# Patient Record
Sex: Female | Born: 1950
Health system: Southern US, Community
[De-identification: ages and names within clinical notes are randomized; demographics above are authoritative.]

## PROBLEM LIST (undated history)

## (undated) DIAGNOSIS — Z9289 Personal history of other medical treatment: Secondary | ICD-10-CM

## (undated) DIAGNOSIS — R413 Other amnesia: Secondary | ICD-10-CM

## (undated) DIAGNOSIS — N189 Chronic kidney disease, unspecified: Secondary | ICD-10-CM

## (undated) DIAGNOSIS — G8929 Other chronic pain: Secondary | ICD-10-CM

## (undated) DIAGNOSIS — R569 Unspecified convulsions: Secondary | ICD-10-CM

## (undated) DIAGNOSIS — I509 Heart failure, unspecified: Secondary | ICD-10-CM

## (undated) DIAGNOSIS — E041 Nontoxic single thyroid nodule: Secondary | ICD-10-CM

## (undated) DIAGNOSIS — I499 Cardiac arrhythmia, unspecified: Secondary | ICD-10-CM

## (undated) DIAGNOSIS — K449 Diaphragmatic hernia without obstruction or gangrene: Secondary | ICD-10-CM

## (undated) DIAGNOSIS — M25512 Pain in left shoulder: Secondary | ICD-10-CM

## (undated) DIAGNOSIS — J45909 Unspecified asthma, uncomplicated: Secondary | ICD-10-CM

## (undated) DIAGNOSIS — I4891 Unspecified atrial fibrillation: Secondary | ICD-10-CM

## (undated) DIAGNOSIS — E119 Type 2 diabetes mellitus without complications: Secondary | ICD-10-CM

## (undated) DIAGNOSIS — R12 Heartburn: Secondary | ICD-10-CM

## (undated) DIAGNOSIS — K219 Gastro-esophageal reflux disease without esophagitis: Secondary | ICD-10-CM

## (undated) DIAGNOSIS — C801 Malignant (primary) neoplasm, unspecified: Secondary | ICD-10-CM

## (undated) DIAGNOSIS — F209 Schizophrenia, unspecified: Secondary | ICD-10-CM

## (undated) DIAGNOSIS — F32A Depression, unspecified: Secondary | ICD-10-CM

## (undated) DIAGNOSIS — K7030 Alcoholic cirrhosis of liver without ascites: Secondary | ICD-10-CM

## (undated) DIAGNOSIS — D649 Anemia, unspecified: Secondary | ICD-10-CM

## (undated) DIAGNOSIS — Z8719 Personal history of other diseases of the digestive system: Secondary | ICD-10-CM

## (undated) DIAGNOSIS — F1020 Alcohol dependence, uncomplicated: Secondary | ICD-10-CM

## (undated) DIAGNOSIS — M1990 Unspecified osteoarthritis, unspecified site: Secondary | ICD-10-CM

## (undated) DIAGNOSIS — I10 Essential (primary) hypertension: Secondary | ICD-10-CM

## (undated) HISTORY — PX: NEPHRECTOMY: SHX65

## (undated) HISTORY — DX: Essential (primary) hypertension: I10

## (undated) HISTORY — PX: TONSILLECTOMY: SUR1361

## (undated) HISTORY — PX: JOINT REPLACEMENT: SHX530

---

## 2006-02-14 HISTORY — PX: ANKLE FRACTURE SURGERY: SHX122

## 2014-03-24 DIAGNOSIS — Z1389 Encounter for screening for other disorder: Secondary | ICD-10-CM | POA: Diagnosis not present

## 2014-03-24 DIAGNOSIS — R42 Dizziness and giddiness: Secondary | ICD-10-CM | POA: Diagnosis not present

## 2014-03-24 DIAGNOSIS — G40109 Localization-related (focal) (partial) symptomatic epilepsy and epileptic syndromes with simple partial seizures, not intractable, without status epilepticus: Secondary | ICD-10-CM | POA: Diagnosis not present

## 2014-03-24 DIAGNOSIS — F339 Major depressive disorder, recurrent, unspecified: Secondary | ICD-10-CM | POA: Diagnosis not present

## 2014-03-24 DIAGNOSIS — R5383 Other fatigue: Secondary | ICD-10-CM | POA: Diagnosis not present

## 2014-03-28 DIAGNOSIS — R748 Abnormal levels of other serum enzymes: Secondary | ICD-10-CM | POA: Diagnosis not present

## 2014-03-28 DIAGNOSIS — R42 Dizziness and giddiness: Secondary | ICD-10-CM | POA: Diagnosis not present

## 2014-03-28 DIAGNOSIS — G40109 Localization-related (focal) (partial) symptomatic epilepsy and epileptic syndromes with simple partial seizures, not intractable, without status epilepticus: Secondary | ICD-10-CM | POA: Diagnosis not present

## 2014-03-28 DIAGNOSIS — D649 Anemia, unspecified: Secondary | ICD-10-CM | POA: Diagnosis not present

## 2014-03-28 DIAGNOSIS — F339 Major depressive disorder, recurrent, unspecified: Secondary | ICD-10-CM | POA: Diagnosis not present

## 2014-03-28 DIAGNOSIS — R5383 Other fatigue: Secondary | ICD-10-CM | POA: Diagnosis not present

## 2014-04-02 DIAGNOSIS — R5383 Other fatigue: Secondary | ICD-10-CM | POA: Diagnosis not present

## 2014-04-02 DIAGNOSIS — I10 Essential (primary) hypertension: Secondary | ICD-10-CM | POA: Diagnosis not present

## 2014-04-02 DIAGNOSIS — R748 Abnormal levels of other serum enzymes: Secondary | ICD-10-CM | POA: Diagnosis not present

## 2014-04-02 DIAGNOSIS — R42 Dizziness and giddiness: Secondary | ICD-10-CM | POA: Diagnosis not present

## 2014-04-02 DIAGNOSIS — H8113 Benign paroxysmal vertigo, bilateral: Secondary | ICD-10-CM | POA: Diagnosis not present

## 2014-04-02 DIAGNOSIS — Z0000 Encounter for general adult medical examination without abnormal findings: Secondary | ICD-10-CM | POA: Diagnosis not present

## 2014-04-02 DIAGNOSIS — D649 Anemia, unspecified: Secondary | ICD-10-CM | POA: Diagnosis not present

## 2014-04-02 DIAGNOSIS — G40109 Localization-related (focal) (partial) symptomatic epilepsy and epileptic syndromes with simple partial seizures, not intractable, without status epilepticus: Secondary | ICD-10-CM | POA: Diagnosis not present

## 2014-04-02 DIAGNOSIS — F339 Major depressive disorder, recurrent, unspecified: Secondary | ICD-10-CM | POA: Diagnosis not present

## 2014-04-16 DIAGNOSIS — R42 Dizziness and giddiness: Secondary | ICD-10-CM | POA: Diagnosis not present

## 2014-04-16 DIAGNOSIS — R5383 Other fatigue: Secondary | ICD-10-CM | POA: Diagnosis not present

## 2014-04-16 DIAGNOSIS — G40109 Localization-related (focal) (partial) symptomatic epilepsy and epileptic syndromes with simple partial seizures, not intractable, without status epilepticus: Secondary | ICD-10-CM | POA: Diagnosis not present

## 2014-04-16 DIAGNOSIS — Z23 Encounter for immunization: Secondary | ICD-10-CM | POA: Diagnosis not present

## 2014-04-16 DIAGNOSIS — I10 Essential (primary) hypertension: Secondary | ICD-10-CM | POA: Diagnosis not present

## 2014-05-07 ENCOUNTER — Ambulatory Visit: Payer: Medicare Other | Admitting: Neurology

## 2014-05-15 ENCOUNTER — Ambulatory Visit: Payer: Medicare Other | Admitting: Neurology

## 2014-05-15 ENCOUNTER — Telehealth: Payer: Self-pay

## 2014-05-15 NOTE — Telephone Encounter (Signed)
Patient did not come to a new patient appointment today. 

## 2014-05-19 DIAGNOSIS — R195 Other fecal abnormalities: Secondary | ICD-10-CM | POA: Diagnosis not present

## 2014-05-26 ENCOUNTER — Encounter: Payer: Self-pay | Admitting: Neurology

## 2014-05-28 DIAGNOSIS — D649 Anemia, unspecified: Secondary | ICD-10-CM | POA: Diagnosis not present

## 2014-05-28 DIAGNOSIS — R195 Other fecal abnormalities: Secondary | ICD-10-CM | POA: Diagnosis not present

## 2014-05-28 DIAGNOSIS — Z87891 Personal history of nicotine dependence: Secondary | ICD-10-CM | POA: Diagnosis not present

## 2014-05-28 DIAGNOSIS — D120 Benign neoplasm of cecum: Secondary | ICD-10-CM | POA: Diagnosis not present

## 2014-05-28 DIAGNOSIS — Z8249 Family history of ischemic heart disease and other diseases of the circulatory system: Secondary | ICD-10-CM | POA: Diagnosis not present

## 2014-05-28 DIAGNOSIS — E669 Obesity, unspecified: Secondary | ICD-10-CM | POA: Diagnosis not present

## 2014-05-28 DIAGNOSIS — K228 Other specified diseases of esophagus: Secondary | ICD-10-CM | POA: Diagnosis not present

## 2014-05-28 DIAGNOSIS — K259 Gastric ulcer, unspecified as acute or chronic, without hemorrhage or perforation: Secondary | ICD-10-CM | POA: Diagnosis not present

## 2014-05-28 DIAGNOSIS — Z79899 Other long term (current) drug therapy: Secondary | ICD-10-CM | POA: Diagnosis not present

## 2014-05-28 DIAGNOSIS — Z8489 Family history of other specified conditions: Secondary | ICD-10-CM | POA: Diagnosis not present

## 2014-05-28 DIAGNOSIS — I10 Essential (primary) hypertension: Secondary | ICD-10-CM | POA: Diagnosis not present

## 2014-05-28 DIAGNOSIS — Z91041 Radiographic dye allergy status: Secondary | ICD-10-CM | POA: Diagnosis not present

## 2014-05-28 DIAGNOSIS — K449 Diaphragmatic hernia without obstruction or gangrene: Secondary | ICD-10-CM | POA: Diagnosis not present

## 2014-05-28 DIAGNOSIS — K5730 Diverticulosis of large intestine without perforation or abscess without bleeding: Secondary | ICD-10-CM | POA: Diagnosis not present

## 2014-05-28 DIAGNOSIS — Z6836 Body mass index (BMI) 36.0-36.9, adult: Secondary | ICD-10-CM | POA: Diagnosis not present

## 2014-05-28 DIAGNOSIS — F329 Major depressive disorder, single episode, unspecified: Secondary | ICD-10-CM | POA: Diagnosis not present

## 2014-06-02 DIAGNOSIS — D120 Benign neoplasm of cecum: Secondary | ICD-10-CM | POA: Diagnosis not present

## 2014-09-17 DIAGNOSIS — K219 Gastro-esophageal reflux disease without esophagitis: Secondary | ICD-10-CM | POA: Diagnosis not present

## 2014-09-17 DIAGNOSIS — M79671 Pain in right foot: Secondary | ICD-10-CM | POA: Diagnosis not present

## 2014-09-17 DIAGNOSIS — M129 Arthropathy, unspecified: Secondary | ICD-10-CM | POA: Diagnosis not present

## 2014-09-17 DIAGNOSIS — I10 Essential (primary) hypertension: Secondary | ICD-10-CM | POA: Diagnosis not present

## 2015-12-04 DIAGNOSIS — T148XXA Other injury of unspecified body region, initial encounter: Secondary | ICD-10-CM | POA: Diagnosis not present

## 2015-12-04 DIAGNOSIS — S2239XA Fracture of one rib, unspecified side, initial encounter for closed fracture: Secondary | ICD-10-CM | POA: Diagnosis not present

## 2015-12-04 DIAGNOSIS — W19XXXA Unspecified fall, initial encounter: Secondary | ICD-10-CM | POA: Diagnosis not present

## 2015-12-04 DIAGNOSIS — R109 Unspecified abdominal pain: Secondary | ICD-10-CM | POA: Diagnosis not present

## 2015-12-04 DIAGNOSIS — S2242XA Multiple fractures of ribs, left side, initial encounter for closed fracture: Secondary | ICD-10-CM | POA: Diagnosis not present

## 2015-12-04 DIAGNOSIS — K469 Unspecified abdominal hernia without obstruction or gangrene: Secondary | ICD-10-CM | POA: Diagnosis not present

## 2015-12-04 DIAGNOSIS — R0781 Pleurodynia: Secondary | ICD-10-CM | POA: Diagnosis not present

## 2015-12-04 DIAGNOSIS — W0110XA Fall on same level from slipping, tripping and stumbling with subsequent striking against unspecified object, initial encounter: Secondary | ICD-10-CM | POA: Diagnosis not present

## 2015-12-04 DIAGNOSIS — D649 Anemia, unspecified: Secondary | ICD-10-CM | POA: Diagnosis not present

## 2015-12-05 DIAGNOSIS — D649 Anemia, unspecified: Secondary | ICD-10-CM | POA: Diagnosis not present

## 2015-12-05 DIAGNOSIS — S2239XA Fracture of one rib, unspecified side, initial encounter for closed fracture: Secondary | ICD-10-CM | POA: Diagnosis not present

## 2015-12-05 DIAGNOSIS — W19XXXA Unspecified fall, initial encounter: Secondary | ICD-10-CM | POA: Diagnosis not present

## 2015-12-06 DIAGNOSIS — D649 Anemia, unspecified: Secondary | ICD-10-CM | POA: Diagnosis not present

## 2015-12-06 DIAGNOSIS — W19XXXA Unspecified fall, initial encounter: Secondary | ICD-10-CM | POA: Diagnosis not present

## 2015-12-06 DIAGNOSIS — S2239XA Fracture of one rib, unspecified side, initial encounter for closed fracture: Secondary | ICD-10-CM | POA: Diagnosis not present

## 2015-12-07 DIAGNOSIS — D649 Anemia, unspecified: Secondary | ICD-10-CM | POA: Diagnosis not present

## 2015-12-07 DIAGNOSIS — R195 Other fecal abnormalities: Secondary | ICD-10-CM | POA: Diagnosis not present

## 2015-12-08 DIAGNOSIS — N390 Urinary tract infection, site not specified: Secondary | ICD-10-CM | POA: Diagnosis not present

## 2015-12-08 DIAGNOSIS — R195 Other fecal abnormalities: Secondary | ICD-10-CM | POA: Diagnosis not present

## 2015-12-08 DIAGNOSIS — D649 Anemia, unspecified: Secondary | ICD-10-CM | POA: Diagnosis not present

## 2015-12-16 DIAGNOSIS — Z23 Encounter for immunization: Secondary | ICD-10-CM | POA: Diagnosis not present

## 2015-12-17 DIAGNOSIS — M79604 Pain in right leg: Secondary | ICD-10-CM | POA: Diagnosis not present

## 2015-12-17 DIAGNOSIS — M79661 Pain in right lower leg: Secondary | ICD-10-CM | POA: Diagnosis not present

## 2016-01-12 DIAGNOSIS — K219 Gastro-esophageal reflux disease without esophagitis: Secondary | ICD-10-CM | POA: Diagnosis not present

## 2016-01-12 DIAGNOSIS — I10 Essential (primary) hypertension: Secondary | ICD-10-CM | POA: Diagnosis not present

## 2016-01-12 DIAGNOSIS — D649 Anemia, unspecified: Secondary | ICD-10-CM | POA: Diagnosis not present

## 2016-12-15 DIAGNOSIS — E875 Hyperkalemia: Secondary | ICD-10-CM | POA: Diagnosis not present

## 2016-12-15 DIAGNOSIS — R110 Nausea: Secondary | ICD-10-CM | POA: Diagnosis not present

## 2016-12-15 DIAGNOSIS — K920 Hematemesis: Secondary | ICD-10-CM | POA: Diagnosis not present

## 2016-12-15 DIAGNOSIS — R1110 Vomiting, unspecified: Secondary | ICD-10-CM | POA: Diagnosis not present

## 2016-12-15 DIAGNOSIS — I951 Orthostatic hypotension: Secondary | ICD-10-CM | POA: Diagnosis not present

## 2016-12-15 DIAGNOSIS — K2970 Gastritis, unspecified, without bleeding: Secondary | ICD-10-CM | POA: Diagnosis not present

## 2016-12-16 ENCOUNTER — Encounter (HOSPITAL_COMMUNITY): Payer: Self-pay | Admitting: Emergency Medicine

## 2016-12-16 ENCOUNTER — Inpatient Hospital Stay (HOSPITAL_COMMUNITY)
Admission: EM | Admit: 2016-12-16 | Discharge: 2016-12-27 | DRG: 391 | Disposition: A | Discharge status: Other Acute Inpatient Hospital | Payer: Medicare Other | Attending: Pulmonary Disease | Admitting: Pulmonary Disease

## 2016-12-16 DIAGNOSIS — F1020 Alcohol dependence, uncomplicated: Secondary | ICD-10-CM | POA: Diagnosis present

## 2016-12-16 DIAGNOSIS — E079 Disorder of thyroid, unspecified: Secondary | ICD-10-CM | POA: Diagnosis present

## 2016-12-16 DIAGNOSIS — K2950 Unspecified chronic gastritis without bleeding: Secondary | ICD-10-CM | POA: Diagnosis present

## 2016-12-16 DIAGNOSIS — R079 Chest pain, unspecified: Secondary | ICD-10-CM | POA: Diagnosis not present

## 2016-12-16 DIAGNOSIS — J9601 Acute respiratory failure with hypoxia: Secondary | ICD-10-CM | POA: Diagnosis not present

## 2016-12-16 DIAGNOSIS — Z791 Long term (current) use of non-steroidal anti-inflammatories (NSAID): Secondary | ICD-10-CM

## 2016-12-16 DIAGNOSIS — M25511 Pain in right shoulder: Secondary | ICD-10-CM | POA: Diagnosis present

## 2016-12-16 DIAGNOSIS — K7030 Alcoholic cirrhosis of liver without ascites: Secondary | ICD-10-CM | POA: Diagnosis present

## 2016-12-16 DIAGNOSIS — K219 Gastro-esophageal reflux disease without esophagitis: Secondary | ICD-10-CM | POA: Diagnosis present

## 2016-12-16 DIAGNOSIS — N281 Cyst of kidney, acquired: Secondary | ICD-10-CM | POA: Diagnosis present

## 2016-12-16 DIAGNOSIS — R0602 Shortness of breath: Secondary | ICD-10-CM

## 2016-12-16 DIAGNOSIS — K3189 Other diseases of stomach and duodenum: Secondary | ICD-10-CM

## 2016-12-16 DIAGNOSIS — R7303 Prediabetes: Secondary | ICD-10-CM | POA: Diagnosis present

## 2016-12-16 DIAGNOSIS — I4891 Unspecified atrial fibrillation: Secondary | ICD-10-CM | POA: Diagnosis not present

## 2016-12-16 DIAGNOSIS — D6489 Other specified anemias: Secondary | ICD-10-CM | POA: Diagnosis present

## 2016-12-16 DIAGNOSIS — G8929 Other chronic pain: Secondary | ICD-10-CM | POA: Diagnosis present

## 2016-12-16 DIAGNOSIS — K319 Disease of stomach and duodenum, unspecified: Secondary | ICD-10-CM | POA: Diagnosis not present

## 2016-12-16 DIAGNOSIS — R1012 Left upper quadrant pain: Secondary | ICD-10-CM

## 2016-12-16 DIAGNOSIS — Z23 Encounter for immunization: Secondary | ICD-10-CM

## 2016-12-16 DIAGNOSIS — N2889 Other specified disorders of kidney and ureter: Secondary | ICD-10-CM | POA: Diagnosis present

## 2016-12-16 DIAGNOSIS — R52 Pain, unspecified: Secondary | ICD-10-CM

## 2016-12-16 DIAGNOSIS — K449 Diaphragmatic hernia without obstruction or gangrene: Secondary | ICD-10-CM | POA: Diagnosis present

## 2016-12-16 DIAGNOSIS — I700 Atherosclerosis of aorta: Secondary | ICD-10-CM | POA: Diagnosis present

## 2016-12-16 DIAGNOSIS — F419 Anxiety disorder, unspecified: Secondary | ICD-10-CM | POA: Diagnosis present

## 2016-12-16 DIAGNOSIS — R1110 Vomiting, unspecified: Secondary | ICD-10-CM | POA: Diagnosis not present

## 2016-12-16 DIAGNOSIS — E43 Unspecified severe protein-calorie malnutrition: Secondary | ICD-10-CM | POA: Diagnosis not present

## 2016-12-16 DIAGNOSIS — E611 Iron deficiency: Secondary | ICD-10-CM | POA: Diagnosis present

## 2016-12-16 DIAGNOSIS — E876 Hypokalemia: Secondary | ICD-10-CM | POA: Diagnosis present

## 2016-12-16 DIAGNOSIS — M1990 Unspecified osteoarthritis, unspecified site: Secondary | ICD-10-CM | POA: Diagnosis present

## 2016-12-16 DIAGNOSIS — M25512 Pain in left shoulder: Secondary | ICD-10-CM | POA: Diagnosis present

## 2016-12-16 DIAGNOSIS — I493 Ventricular premature depolarization: Secondary | ICD-10-CM | POA: Diagnosis present

## 2016-12-16 DIAGNOSIS — R112 Nausea with vomiting, unspecified: Secondary | ICD-10-CM

## 2016-12-16 DIAGNOSIS — G9340 Encephalopathy, unspecified: Secondary | ICD-10-CM | POA: Diagnosis present

## 2016-12-16 DIAGNOSIS — D649 Anemia, unspecified: Secondary | ICD-10-CM | POA: Diagnosis present

## 2016-12-16 DIAGNOSIS — G9341 Metabolic encephalopathy: Secondary | ICD-10-CM | POA: Diagnosis not present

## 2016-12-16 DIAGNOSIS — K311 Adult hypertrophic pyloric stenosis: Secondary | ICD-10-CM | POA: Diagnosis present

## 2016-12-16 DIAGNOSIS — E871 Hypo-osmolality and hyponatremia: Secondary | ICD-10-CM | POA: Diagnosis present

## 2016-12-16 DIAGNOSIS — K5730 Diverticulosis of large intestine without perforation or abscess without bleeding: Secondary | ICD-10-CM | POA: Diagnosis present

## 2016-12-16 DIAGNOSIS — K254 Chronic or unspecified gastric ulcer with hemorrhage: Secondary | ICD-10-CM | POA: Diagnosis present

## 2016-12-16 DIAGNOSIS — Z91041 Radiographic dye allergy status: Secondary | ICD-10-CM

## 2016-12-16 DIAGNOSIS — R10816 Epigastric abdominal tenderness: Secondary | ICD-10-CM | POA: Diagnosis present

## 2016-12-16 DIAGNOSIS — J9690 Respiratory failure, unspecified, unspecified whether with hypoxia or hypercapnia: Secondary | ICD-10-CM

## 2016-12-16 DIAGNOSIS — Z9889 Other specified postprocedural states: Secondary | ICD-10-CM

## 2016-12-16 DIAGNOSIS — R1111 Vomiting without nausea: Secondary | ICD-10-CM | POA: Diagnosis not present

## 2016-12-16 DIAGNOSIS — D62 Acute posthemorrhagic anemia: Secondary | ICD-10-CM | POA: Diagnosis present

## 2016-12-16 DIAGNOSIS — I10 Essential (primary) hypertension: Secondary | ICD-10-CM | POA: Diagnosis present

## 2016-12-16 DIAGNOSIS — Z4659 Encounter for fitting and adjustment of other gastrointestinal appliance and device: Secondary | ICD-10-CM

## 2016-12-16 DIAGNOSIS — I480 Paroxysmal atrial fibrillation: Secondary | ICD-10-CM | POA: Diagnosis present

## 2016-12-16 DIAGNOSIS — Z91013 Allergy to seafood: Secondary | ICD-10-CM

## 2016-12-16 DIAGNOSIS — J690 Pneumonitis due to inhalation of food and vomit: Secondary | ICD-10-CM | POA: Diagnosis not present

## 2016-12-16 HISTORY — DX: Alcohol dependence, uncomplicated: F10.20

## 2016-12-16 HISTORY — DX: Other chronic pain: G89.29

## 2016-12-16 HISTORY — DX: Pain in left shoulder: M25.512

## 2016-12-16 HISTORY — DX: Heartburn: R12

## 2016-12-16 HISTORY — DX: Diaphragmatic hernia without obstruction or gangrene: K44.9

## 2016-12-16 HISTORY — DX: Unspecified osteoarthritis, unspecified site: M19.90

## 2016-12-16 LAB — COMPREHENSIVE METABOLIC PANEL
ALBUMIN: 2.4 g/dL — AB (ref 3.5–5.0)
ALK PHOS: 94 U/L (ref 38–126)
ALT: 13 U/L — AB (ref 14–54)
ANION GAP: 12 (ref 5–15)
AST: 16 U/L (ref 15–41)
BILIRUBIN TOTAL: 1 mg/dL (ref 0.3–1.2)
BUN: 18 mg/dL (ref 6–20)
CHLORIDE: 95 mmol/L — AB (ref 101–111)
CO2: 24 mmol/L (ref 22–32)
CREATININE: 0.74 mg/dL (ref 0.44–1.00)
Calcium: 8.2 mg/dL — ABNORMAL LOW (ref 8.9–10.3)
Glucose, Bld: 127 mg/dL — ABNORMAL HIGH (ref 65–99)
POTASSIUM: 3.2 mmol/L — AB (ref 3.5–5.1)
SODIUM: 131 mmol/L — AB (ref 135–145)
Total Protein: 6 g/dL — ABNORMAL LOW (ref 6.5–8.1)

## 2016-12-16 LAB — LIPASE, BLOOD: Lipase: 129 U/L — ABNORMAL HIGH (ref 11–51)

## 2016-12-16 LAB — CBC
HCT: 31.8 % — ABNORMAL LOW (ref 36.0–46.0)
Hemoglobin: 10.2 g/dL — ABNORMAL LOW (ref 12.0–15.0)
MCH: 27.3 pg (ref 26.0–34.0)
MCHC: 32.1 g/dL (ref 30.0–36.0)
MCV: 85 fL (ref 78.0–100.0)
PLATELETS: 427 10*3/uL — AB (ref 150–400)
RBC: 3.74 MIL/uL — AB (ref 3.87–5.11)
RDW: 15.5 % (ref 11.5–15.5)
WBC: 12.7 10*3/uL — AB (ref 4.0–10.5)

## 2016-12-16 MED ORDER — ONDANSETRON HCL 4 MG/2ML IJ SOLN
4.0000 mg | Freq: Once | INTRAMUSCULAR | Status: AC
  Administered 2016-12-17: 4 mg via INTRAVENOUS
  Filled 2016-12-16: qty 2

## 2016-12-16 MED ORDER — ONDANSETRON 4 MG PO TBDP
4.0000 mg | ORAL_TABLET | Freq: Once | ORAL | Status: AC | PRN
  Administered 2016-12-16: 4 mg via ORAL
  Filled 2016-12-16: qty 1

## 2016-12-16 MED ORDER — SODIUM CHLORIDE 0.9 % IV BOLUS (SEPSIS)
1000.0000 mL | Freq: Once | INTRAVENOUS | Status: AC
  Administered 2016-12-17: 1000 mL via INTRAVENOUS

## 2016-12-16 MED ORDER — HYDROMORPHONE HCL 1 MG/ML IJ SOLN
0.5000 mg | Freq: Once | INTRAMUSCULAR | Status: AC
  Administered 2016-12-17: 0.5 mg via INTRAVENOUS
  Filled 2016-12-16: qty 1

## 2016-12-16 NOTE — ED Provider Notes (Addendum)
[facility] EMERGENCY DEPARTMENT Provider Note   CSN: [id number] Arrival date & time: 12/16/16  1646     History   Chief Complaint Chief Complaint  Patient presents with  . Emesis  . Abdominal Pain    HPI Nicole [name] is a 66 y.o. female.  Patient presents to the emergency department for evaluation of upper abdominal pain with nausea and vomiting.  Symptoms ongoing for 5 days now.  Patient does report that one week ago she did drink heavily.  She has not drank anything since then, however.  She has never had pancreatitis.  She has had issues with gastritis and acid reflux.  Patient reports of persistent pain in the upper abdomen that worsens when she eats then he vomits.  No diarrhea, melena, hematochezia.  She has not had hematemesis.  She is trying to hold down sips of water but does not feel like she has been able to hydrate.      Past Medical History:  Diagnosis Date  . Arthritis   . Chronic alcoholism ([place])   . Chronic right shoulder pain   . Heartburn   . Hiatal hernia 12/17/2016   stomach noted in chest on CT scan  . Hypertension     Patient Active Problem List   Diagnosis Date Noted  . Gastric mass 12/17/2016    Class: Acute  . Right renal mass 12/17/2016    Class: Acute  . Diverticulosis of sigmoid colon 12/17/2016  . Hiatal hernia 12/17/2016    Class: Acute  . Chronic alcoholism ([place]) 12/17/2016    Class: Chronic  . Chronic right shoulder pain 12/17/2016    Class: Chronic  . Arthritis 12/17/2016    Class: Chronic  . Hyponatremia 12/17/2016    Class: Acute  . Hypokalemia 12/17/2016    Class: Acute  . Anemia 12/17/2016    Past Surgical History:  Procedure Laterality Date  . ANKLE FRACTURE SURGERY Right 2008   screws  . ANKLE FRACTURE SURGERY Left 2008   plates    OB History    No data available       Home Medications    Prior to Admission medications   Medication Sig Start Date End Date Taking? Authorizing Provider   acetaminophen (TYLENOL) 500 MG tablet Take 1,000 mg by mouth at bedtime.   Yes [provider]  Calcium Carb-Cholecalciferol (CALCIUM 600 + D PO) Take 600 mg by mouth daily with breakfast.   Yes [provider]  ferrous sulfate 325 (65 FE) MG tablet Take 325 mg by mouth daily with breakfast.   Yes [provider]  guaiFENesin (MUCINEX) 600 MG 12 hr tablet Take 600 mg by mouth daily.   Yes [provider]  ibuprofen (ADVIL,MOTRIN) 200 MG tablet Take 400 mg by mouth daily as needed (arthritis pain).   Yes [provider]  lisinopril-hydrochlorothiazide (PRINZIDE,ZESTORETIC) 20-25 MG tablet Take 1 tablet by mouth daily.   Yes [provider]  Multiple Vitamin (MULTIVITAMIN WITH MINERALS) TABS tablet Take 1 tablet by mouth daily with breakfast.   Yes [provider]  ondansetron (ZOFRAN-ODT) 8 MG disintegrating tablet Take 8 mg by mouth every 8 (eight) hours as needed for nausea or vomiting.   Yes [provider]  pantoprazole (PROTONIX) 40 MG tablet Take 40 mg by mouth daily.   Yes [provider]  phenylephrine (SUDAFED PE) 10 MG TABS tablet Take 10 mg by mouth at bedtime.   Yes [provider]  Propylene Glycol (  SYSTANE COMPLETE) 0.6 % SOLN Place 1 drop into both eyes See admin instructions. Instill one drop into both eyes every morning, may also use later in the day as needed for dry eyes   Yes [provider]  sucralfate (CARAFATE) 1 g tablet Take 1 g by mouth See admin instructions. Take 1 tablet (1 g) by mouth 4 times daily - thirty minutes before meals and bedtime   Yes [provider]    Family History Family History  Problem Relation Age of Onset  . Heart attack Father   . Ulcers Father   . Diabetes Sister   . Cancer Brother   . Diabetes Sister   . Diabetes Sister     Social History Social History  Substance Use Topics  . Smoking status: Never Smoker  . Smokeless tobacco:  Never Used  . Alcohol use Yes     Comment: heavy     Allergies   Other; Shellfish allergy; and Iodinated diagnostic agents   Review of Systems Review of Systems  Gastrointestinal: Positive for abdominal pain, nausea and vomiting.  All other systems reviewed and are negative.    Physical Exam Updated Vital Signs BP 112/69   Pulse 99   Temp 98.2 F (36.8 C) (Oral)   Resp 18   SpO2 93%   Physical Exam  Constitutional: She is oriented to person, place, and time. She appears well-developed and well-nourished. No distress.  HENT:  Head: Normocephalic and atraumatic.  Right Ear: Hearing normal.  Left Ear: Hearing normal.  Nose: Nose normal.  Mouth/Throat: Oropharynx is clear and moist and mucous membranes are normal.  Eyes: Pupils are equal, round, and reactive to light. Conjunctivae and EOM are normal.  Neck: Normal range of motion. Neck supple.  Cardiovascular: Regular rhythm, S1 normal and S2 normal.  Exam reveals no gallop and no friction rub.   No murmur heard. Pulmonary/Chest: Effort normal and breath sounds normal. No respiratory distress. She exhibits no tenderness.  Abdominal: Soft. Normal appearance and bowel sounds are normal. There is no hepatosplenomegaly. There is tenderness in the epigastric area and left upper quadrant. There is no rebound, no guarding, no tenderness at McBurney's point and negative Murphy's sign. No hernia.  Musculoskeletal: Normal range of motion.  Neurological: She is alert and oriented to person, place, and time. She has normal strength. No cranial nerve deficit or sensory deficit. Coordination normal. GCS eye subscore is 4. GCS verbal subscore is 5. GCS motor subscore is 6.  Skin: Skin is warm, dry and intact. No rash noted. No cyanosis.  Psychiatric: She has a normal mood and affect. Her speech is normal and behavior is normal. Thought content normal.  Nursing note and vitals reviewed.    ED Treatments / Results  Labs (all labs ordered  are listed, but only abnormal results are displayed) Labs Reviewed  LIPASE, BLOOD - Abnormal; Notable for the following:       Result Value   Lipase 129 (*)    All other components within normal limits  COMPREHENSIVE METABOLIC PANEL - Abnormal; Notable for the following:    Sodium 131 (*)    Potassium 3.2 (*)    Chloride 95 (*)    Glucose, Bld 127 (*)    Calcium 8.2 (*)    Total Protein 6.0 (*)    Albumin 2.4 (*)    ALT 13 (*)    All other components within normal limits  CBC - Abnormal; Notable for the following:  WBC 12.7 (*)    RBC 3.74 (*)    Hemoglobin 10.2 (*)    HCT 31.8 (*)    Platelets 427 (*)    All other components within normal limits  URINALYSIS, ROUTINE W REFLEX MICROSCOPIC - Abnormal; Notable for the following:    Hgb urine dipstick SMALL (*)    Ketones, ur 80 (*)    Protein, ur 30 (*)    Bacteria, UA RARE (*)    Squamous Epithelial / LPF 0-5 (*)    All other components within normal limits  IRON AND TIBC  FERRITIN    EKG  EKG Interpretation  Date/Time:  Saturday December 17 2016 08:40:17 EDT Ventricular Rate:  163 PR Interval:    QRS Duration: 87 QT Interval:  294 QTC Calculation: 485 R Axis:   115 Text Interpretation:  Atrial fibrillation with rapid V-rate Ventricular premature complex Left posterior fascicular block Abnormal R-wave progression, late transition Minimal ST depression, inferior leads Borderline T abnormalities, inferior leads Confirmed by Orpah Greek [phone]) on 12/17/2016 8:46:51 AM       Radiology Ct Abdomen Pelvis Wo Contrast  Result Date: 12/17/2016 CLINICAL DATA:  Abdominal pain.  Nausea and vomiting for 4 days. EXAM: CT ABDOMEN AND PELVIS WITHOUT CONTRAST TECHNIQUE: Multidetector CT imaging of the abdomen and pelvis was performed following the standard protocol without IV contrast. COMPARISON:  None. FINDINGS: Lower chest: Small left and trace right pleural effusion. Large hiatal hernia is only partially included in  the field of view. Hepatobiliary: Low-density structure either within or abutting the left lobe of the liver is incontinuity with an ill-defined left upper quadrant fluid collection. No suspicious lesion on noncontrast exam. Gallbladder physiologically distended, no calcified stone. No biliary dilatation. Pancreas: Pancreas is poorly defined in the absence of IV contrast. Fluid within or extraluminal to the stomach abuts the region of the proximal pancreas. Spleen: Normal in size without focal abnormality. Adrenals/Urinary Tract: Left adrenal thickening. Normal right adrenal gland. There is a 6.2 cm low-density mass in the right kidney with central calcifications no hydronephrosis. No left hydronephrosis or focal renal lesion on noncontrast exam. Urinary bladder is in a small left upper quadrant irregular fluid collection. Stomach/bowel: Large hiatal hernia, majority of the stomach is intrathoracic. Administered enteric contrast outlines a large 12 cm homogeneous low-density gastric mass. This extends to the diaphragmatic hiatus. Adjacent to the hiatus in the upper abdomen is fluid that may be within a gastric diverticulum or fluid collection image 26 series 3. There is a separate 6.2 cm fluid collection abutting the distal stomach image 33 series 3. Associated soft tissue stranding in the right abdomen and left upper quadrant. Ill-defined left upper quadrant fluid anteriorly extends to the left lobe of the liver. No air within any of these fluid collections. There is no extraluminal enteric contrast. Despite the extraluminal fluid collections there is no extraluminal air. Enteric contrast opacifies normal-appearing duodenum and small bowel without obstruction or wall thickening. Normal appendix tentatively identified. No colonic wall thickening or inflammation, diverticulosis noted from the splenic flexure distally. No diverticulitis. Vascular/Lymphatic: Dense aortic atherosclerosis. No aneurysm. Vascular  structures particularly in the upper abdomen are not well-defined. Limited assessment for upper abdominal adenopathy. Reproductive: Uterus and bilateral adnexa are unremarkable. Other: Perigastric fluid collections in the upper abdomen as described. Upper abdominal soft tissue stranding. Small amount of free fluid in the pelvis. No evidence of free air. Musculoskeletal: There are no acute or suspicious osseous abnormalities. Degenerative change in the spine and  both hips. IMPRESSION: 1. Inflammatory process in the upper abdomen, appears centered on the stomach. There is a large hiatal hernia, which contains a large gastric filling defect, which may be a gastric mass, bezoar or less likely hematoma. This is in possible continuity with gastric diverticulum or perigastric fluid collection in the upper abdomen. There are separate perigastric fluid collections in the upper abdomen, with adjacent soft tissue stranding and inflammation. Despite the peri-gastric fluid collections there is no free air or extraluminal contrast. Underlying etiology is uncertain, however considerations include penetrating gastric mass with associated abscesses. Peptic ulcer disease with intragastric hematoma and contained perforations are also considered. Alternative sources of left upper quadrant fluid collection such as pancreatitis with pseudocyst are felt less likely. Recommend GI consultation, possible endoscopic evaluation. 2. Heterogeneous 6.2 cm right renal mass, complex cyst versus solid lesion. Recommend nonemergent characterization with renal protocol MRI after resolution of acute event. These results were called by telephone at the time of interpretation on 12/17/2016 at 3:34 am to Dr. Joseph Berkshire , who verbally acknowledged these results. Electronically Signed   By: Jeb Levering M.D.   On: 12/17/2016 03:42    Procedures Procedures (including critical care time)  Medications Ordered in ED Medications  iopamidol  (ISOVUE-300) 61 % injection (not administered)  Barium Sulfate 2.1 % SUSP (not administered)  pantoprazole (PROTONIX) 80 mg in sodium chloride 0.9 % 250 mL (0.32 mg/mL) infusion (0 mg/hr Intravenous Stopped 12/17/16 0823)  pantoprazole (PROTONIX) injection 40 mg (not administered)  0.9 % NaCl with KCl 40 mEq / L  infusion (not administered)  lisinopril (PRINIVIL,ZESTRIL) tablet 20 mg (not administered)    And  hydrochlorothiazide (HYDRODIURIL) tablet 25 mg (not administered)  diltiazem (CARDIZEM) 1 mg/mL load via infusion 10 mg (not administered)    And  diltiazem (CARDIZEM) 100 mg in dextrose 5 % 100 mL (1 mg/mL) infusion (not administered)  ondansetron (ZOFRAN-ODT) disintegrating tablet 4 mg (4 mg Oral Given 12/16/16 2330)  sodium chloride 0.9 % bolus 1,000 mL (0 mLs Intravenous Stopped 12/17/16 0110)  HYDROmorphone (DILAUDID) injection 0.5 mg (0.5 mg Intravenous Given 12/17/16 0009)  ondansetron (ZOFRAN) injection 4 mg (4 mg Intravenous Given 12/17/16 0010)  HYDROmorphone (DILAUDID) injection 1 mg (1 mg Intravenous Given 12/17/16 0414)  piperacillin-tazobactam (ZOSYN) IVPB 3.375 g (0 g Intravenous Stopped 12/17/16 0636)  pantoprazole (PROTONIX) 80 mg in sodium chloride 0.9 % 100 mL IVPB (80 mg Intravenous New Bag/Given 12/17/16 0751)     Initial Impression / Assessment and Plan / ED Course  I have reviewed the triage vital signs and the nursing notes.  Pertinent labs & imaging results that were available during my care of the patient were reviewed by me and considered in my medical decision making (see chart for details).     Patient presents to the ER for evaluation with a history of progressively worsening upper abdominal pain.  Patient does admit to drinking several days prior to onset of symptoms, does report frequent alcohol intake.  She has never had pancreatitis.  Her lipase was slightly elevated.  Remainder of blood work was fairly unremarkable.  Patient underwent imaging to further  evaluate.  Patient has a large hiatal hernia.  A large portion of her stomach is in the left hemithorax and there appears to be a filling defect that is concerning for possible stomach mass.  There is fluid around the stomach.  Differential diagnosis is penetrating gastric mass or possible anterior gastric hematoma from peptic ulcer.  General surgery consultation.  Adendum: Patient seen by Dr. Redmond Pulling, general surgery.  He does not feel that the patient requires emergent surgery at this time.  Recommends medicine admission and GI consultation.  Discussed with Dr. Michail Sermon, gastroenterology.  Patient will be seen by GI.  Addendum: Seen by Dr. Evangeline Gula, hospitalist service.  Patient noted to be more tachycardic.  She had an EKG performed and she is in atrial fibrillation with a rapid ventricular response.  Patient not a candidate for anticoagulation because of her possible need for surgery and endoscopy.  She will, however, require rate control.  Patient initiated on IV Cardizem.  CRITICAL CARE Performed by: Orpah Greek   Total critical care time: 30 minutes  Critical care time was exclusive of separately billable procedures and treating other patients.  Critical care was necessary to treat or prevent imminent or life-threatening deterioration.  Critical care was time spent personally by me on the following activities: development of treatment plan with patient and/or surrogate as well as nursing, discussions with consultants, evaluation of patient's response to treatment, examination of patient, obtaining history from patient or surrogate, ordering and performing treatments and interventions, ordering and review of laboratory studies, ordering and review of radiographic studies, pulse oximetry and re-evaluation of patient's condition.   CHA2DS2/VAS Stroke Risk Points      Not Applicable >= 2 Points: High Risk  1 - 1.99 Points: Medium Risk  0 Points: Low Risk    A final score could not  be computed because of missing components.:   Change: N/A     This score determines the patient's risk of having a stroke if the  patient has atrial fibrillation.      This score is not applicable to this patient. Components are not  calculated.        Final Clinical Impressions(s) / ED Diagnoses   Final diagnoses:  Left upper quadrant pain  Atrial fibrillation with R[facility])    New Prescriptions New Prescriptions   No medications on file     Orpah Greek, MD 12/17/16 0505    Orpah Greek, MD 12/17/16 9024    Orpah Greek, MD 12/17/16 0973    Orpah Greek, MD 12/17/16 [phone]

## 2016-12-16 NOTE — ED Triage Notes (Signed)
Pt to ER for evaluation of persistent nausea and vomiting x4 days. States unable to keep anything down including water. States epigastric pain starting after vomiting. VSS at this time. Pt a/o x4.

## 2016-12-17 ENCOUNTER — Emergency Department (HOSPITAL_COMMUNITY): Payer: Medicare Other

## 2016-12-17 ENCOUNTER — Encounter (HOSPITAL_COMMUNITY): Payer: Self-pay | Admitting: Internal Medicine

## 2016-12-17 DIAGNOSIS — Z23 Encounter for immunization: Secondary | ICD-10-CM | POA: Diagnosis not present

## 2016-12-17 DIAGNOSIS — J690 Pneumonitis due to inhalation of food and vomit: Secondary | ICD-10-CM | POA: Diagnosis not present

## 2016-12-17 DIAGNOSIS — R1012 Left upper quadrant pain: Secondary | ICD-10-CM | POA: Diagnosis present

## 2016-12-17 DIAGNOSIS — R1900 Intra-abdominal and pelvic swelling, mass and lump, unspecified site: Secondary | ICD-10-CM | POA: Diagnosis not present

## 2016-12-17 DIAGNOSIS — I361 Nonrheumatic tricuspid (valve) insufficiency: Secondary | ICD-10-CM | POA: Diagnosis not present

## 2016-12-17 DIAGNOSIS — K5730 Diverticulosis of large intestine without perforation or abscess without bleeding: Secondary | ICD-10-CM | POA: Diagnosis present

## 2016-12-17 DIAGNOSIS — J9811 Atelectasis: Secondary | ICD-10-CM | POA: Diagnosis not present

## 2016-12-17 DIAGNOSIS — G9340 Encephalopathy, unspecified: Secondary | ICD-10-CM | POA: Diagnosis not present

## 2016-12-17 DIAGNOSIS — I10 Essential (primary) hypertension: Secondary | ICD-10-CM | POA: Diagnosis present

## 2016-12-17 DIAGNOSIS — K449 Diaphragmatic hernia without obstruction or gangrene: Secondary | ICD-10-CM

## 2016-12-17 DIAGNOSIS — K311 Adult hypertrophic pyloric stenosis: Secondary | ICD-10-CM | POA: Diagnosis not present

## 2016-12-17 DIAGNOSIS — F1020 Alcohol dependence, uncomplicated: Secondary | ICD-10-CM

## 2016-12-17 DIAGNOSIS — K254 Chronic or unspecified gastric ulcer with hemorrhage: Secondary | ICD-10-CM | POA: Diagnosis present

## 2016-12-17 DIAGNOSIS — K319 Disease of stomach and duodenum, unspecified: Secondary | ICD-10-CM | POA: Diagnosis not present

## 2016-12-17 DIAGNOSIS — Z9911 Dependence on respirator [ventilator] status: Secondary | ICD-10-CM | POA: Diagnosis not present

## 2016-12-17 DIAGNOSIS — Z430 Encounter for attention to tracheostomy: Secondary | ICD-10-CM | POA: Diagnosis not present

## 2016-12-17 DIAGNOSIS — N2889 Other specified disorders of kidney and ureter: Secondary | ICD-10-CM | POA: Diagnosis present

## 2016-12-17 DIAGNOSIS — R1013 Epigastric pain: Secondary | ICD-10-CM | POA: Diagnosis not present

## 2016-12-17 DIAGNOSIS — R1110 Vomiting, unspecified: Secondary | ICD-10-CM | POA: Diagnosis not present

## 2016-12-17 DIAGNOSIS — I4891 Unspecified atrial fibrillation: Secondary | ICD-10-CM

## 2016-12-17 DIAGNOSIS — I493 Ventricular premature depolarization: Secondary | ICD-10-CM | POA: Diagnosis present

## 2016-12-17 DIAGNOSIS — R109 Unspecified abdominal pain: Secondary | ICD-10-CM | POA: Diagnosis not present

## 2016-12-17 DIAGNOSIS — R112 Nausea with vomiting, unspecified: Secondary | ICD-10-CM | POA: Diagnosis not present

## 2016-12-17 DIAGNOSIS — D62 Acute posthemorrhagic anemia: Secondary | ICD-10-CM | POA: Diagnosis not present

## 2016-12-17 DIAGNOSIS — E876 Hypokalemia: Secondary | ICD-10-CM | POA: Diagnosis not present

## 2016-12-17 DIAGNOSIS — R05 Cough: Secondary | ICD-10-CM | POA: Diagnosis not present

## 2016-12-17 DIAGNOSIS — M25512 Pain in left shoulder: Secondary | ICD-10-CM | POA: Diagnosis not present

## 2016-12-17 DIAGNOSIS — R161 Splenomegaly, not elsewhere classified: Secondary | ICD-10-CM | POA: Diagnosis not present

## 2016-12-17 DIAGNOSIS — R1111 Vomiting without nausea: Secondary | ICD-10-CM | POA: Diagnosis not present

## 2016-12-17 DIAGNOSIS — R1033 Periumbilical pain: Secondary | ICD-10-CM | POA: Diagnosis not present

## 2016-12-17 DIAGNOSIS — I480 Paroxysmal atrial fibrillation: Secondary | ICD-10-CM | POA: Diagnosis present

## 2016-12-17 DIAGNOSIS — R9431 Abnormal electrocardiogram [ECG] [EKG]: Secondary | ICD-10-CM | POA: Diagnosis not present

## 2016-12-17 DIAGNOSIS — D490 Neoplasm of unspecified behavior of digestive system: Secondary | ICD-10-CM | POA: Diagnosis not present

## 2016-12-17 DIAGNOSIS — G8929 Other chronic pain: Secondary | ICD-10-CM | POA: Insufficient documentation

## 2016-12-17 DIAGNOSIS — Z4682 Encounter for fitting and adjustment of non-vascular catheter: Secondary | ICD-10-CM | POA: Diagnosis not present

## 2016-12-17 DIAGNOSIS — E871 Hypo-osmolality and hyponatremia: Secondary | ICD-10-CM | POA: Diagnosis not present

## 2016-12-17 DIAGNOSIS — D649 Anemia, unspecified: Secondary | ICD-10-CM | POA: Diagnosis present

## 2016-12-17 DIAGNOSIS — R079 Chest pain, unspecified: Secondary | ICD-10-CM | POA: Diagnosis not present

## 2016-12-17 DIAGNOSIS — K3189 Other diseases of stomach and duodenum: Secondary | ICD-10-CM | POA: Diagnosis present

## 2016-12-17 DIAGNOSIS — R918 Other nonspecific abnormal finding of lung field: Secondary | ICD-10-CM | POA: Diagnosis not present

## 2016-12-17 DIAGNOSIS — J9601 Acute respiratory failure with hypoxia: Secondary | ICD-10-CM | POA: Diagnosis not present

## 2016-12-17 DIAGNOSIS — G9341 Metabolic encephalopathy: Secondary | ICD-10-CM | POA: Diagnosis not present

## 2016-12-17 DIAGNOSIS — M1990 Unspecified osteoarthritis, unspecified site: Secondary | ICD-10-CM | POA: Diagnosis not present

## 2016-12-17 DIAGNOSIS — J9600 Acute respiratory failure, unspecified whether with hypoxia or hypercapnia: Secondary | ICD-10-CM | POA: Diagnosis not present

## 2016-12-17 DIAGNOSIS — E43 Unspecified severe protein-calorie malnutrition: Secondary | ICD-10-CM | POA: Diagnosis not present

## 2016-12-17 DIAGNOSIS — F419 Anxiety disorder, unspecified: Secondary | ICD-10-CM | POA: Diagnosis present

## 2016-12-17 DIAGNOSIS — N281 Cyst of kidney, acquired: Secondary | ICD-10-CM | POA: Diagnosis present

## 2016-12-17 DIAGNOSIS — K2950 Unspecified chronic gastritis without bleeding: Secondary | ICD-10-CM | POA: Diagnosis not present

## 2016-12-17 DIAGNOSIS — J9690 Respiratory failure, unspecified, unspecified whether with hypoxia or hypercapnia: Secondary | ICD-10-CM | POA: Diagnosis not present

## 2016-12-17 DIAGNOSIS — E611 Iron deficiency: Secondary | ICD-10-CM | POA: Diagnosis present

## 2016-12-17 DIAGNOSIS — R0602 Shortness of breath: Secondary | ICD-10-CM | POA: Diagnosis not present

## 2016-12-17 DIAGNOSIS — J90 Pleural effusion, not elsewhere classified: Secondary | ICD-10-CM | POA: Diagnosis not present

## 2016-12-17 HISTORY — DX: Diaphragmatic hernia without obstruction or gangrene: K44.9

## 2016-12-17 LAB — URINALYSIS, ROUTINE W REFLEX MICROSCOPIC
Bilirubin Urine: NEGATIVE
Glucose, UA: NEGATIVE mg/dL
Ketones, ur: 80 mg/dL — AB
LEUKOCYTES UA: NEGATIVE
NITRITE: NEGATIVE
PH: 6 (ref 5.0–8.0)
Protein, ur: 30 mg/dL — AB
SPECIFIC GRAVITY, URINE: 1.02 (ref 1.005–1.030)

## 2016-12-17 LAB — I-STAT TROPONIN, ED: TROPONIN I, POC: 0 ng/mL (ref 0.00–0.08)

## 2016-12-17 LAB — TROPONIN I: Troponin I: <0.03 ng/mL (ref <0.03)

## 2016-12-17 LAB — TSH: TSH: 1.42 u[IU]/mL (ref 0.350–4.500)

## 2016-12-17 LAB — IRON AND TIBC
Iron: 13 ug/dL — ABNORMAL LOW (ref 28–170)
SATURATION RATIOS: 7 % — AB (ref 10.4–31.8)
TIBC: 189 ug/dL — AB (ref 250–450)
UIBC: 176 ug/dL

## 2016-12-17 LAB — GLUCOSE, CAPILLARY: GLUCOSE-CAPILLARY: 98 mg/dL (ref 65–99)

## 2016-12-17 LAB — FERRITIN: FERRITIN: 199 ng/mL (ref 11–307)

## 2016-12-17 LAB — BRAIN NATRIURETIC PEPTIDE: B Natriuretic Peptide: 83.7 pg/mL (ref 0.0–100.0)

## 2016-12-17 MED ORDER — PIPERACILLIN-TAZOBACTAM 3.375 G IVPB 30 MIN
3.3750 g | Freq: Once | INTRAVENOUS | Status: AC
  Administered 2016-12-17: 3.375 g via INTRAVENOUS
  Filled 2016-12-17: qty 50

## 2016-12-17 MED ORDER — ACETAMINOPHEN 650 MG RE SUPP
650.0000 mg | Freq: Four times a day (QID) | RECTAL | Status: DC | PRN
Start: 2016-12-17 — End: 2016-12-23

## 2016-12-17 MED ORDER — LISINOPRIL-HYDROCHLOROTHIAZIDE 20-25 MG PO TABS: 1.0000 | ORAL_TABLET | Freq: Every day | ORAL | Status: DC

## 2016-12-17 MED ORDER — ACETAMINOPHEN 325 MG PO TABS
650.0000 mg | ORAL_TABLET | Freq: Four times a day (QID) | ORAL | Status: DC | PRN
Start: 2016-12-17 — End: 2016-12-23

## 2016-12-17 MED ORDER — VITAMIN B-1 100 MG PO TABS
100.0000 mg | ORAL_TABLET | Freq: Every day | ORAL | Status: DC
  Administered 2016-12-17: 100 mg via ORAL
  Filled 2016-12-17: qty 1

## 2016-12-17 MED ORDER — LISINOPRIL 20 MG PO TABS
20.0000 mg | ORAL_TABLET | Freq: Every day | ORAL | Status: DC
  Administered 2016-12-17 (×2): 20 mg via ORAL
  Filled 2016-12-17 (×5): qty 1

## 2016-12-17 MED ORDER — BARIUM SULFATE 2.1 % PO SUSP
ORAL | Status: AC
  Filled 2016-12-17: qty 2

## 2016-12-17 MED ORDER — LORAZEPAM 1 MG PO TABS: 1.0000 mg | ORAL_TABLET | Freq: Four times a day (QID) | ORAL | Status: DC | PRN

## 2016-12-17 MED ORDER — INFLUENZA VAC SPLIT HIGH-DOSE 0.5 ML IM SUSY
0.5000 mL | PREFILLED_SYRINGE | INTRAMUSCULAR | Status: AC
  Administered 2016-12-20: 0.5 mL via INTRAMUSCULAR
  Filled 2016-12-17: qty 0.5

## 2016-12-17 MED ORDER — OXYCODONE HCL 5 MG PO TABS
5.0000 mg | ORAL_TABLET | ORAL | Status: DC | PRN
  Administered 2016-12-17: 5 mg via ORAL
  Filled 2016-12-17: qty 1

## 2016-12-17 MED ORDER — HYDROMORPHONE HCL 1 MG/ML IJ SOLN
1.0000 mg | Freq: Once | INTRAMUSCULAR | Status: AC
  Administered 2016-12-17: 1 mg via INTRAVENOUS
  Filled 2016-12-17: qty 1

## 2016-12-17 MED ORDER — ONDANSETRON HCL 4 MG/2ML IJ SOLN
4.0000 mg | Freq: Four times a day (QID) | INTRAMUSCULAR | Status: DC | PRN
  Administered 2016-12-17 – 2016-12-18 (×3): 4 mg via INTRAVENOUS
  Filled 2016-12-17 (×3): qty 2

## 2016-12-17 MED ORDER — POTASSIUM CHLORIDE IN NACL 20-0.9 MEQ/L-% IV SOLN
INTRAVENOUS | Status: DC
  Administered 2016-12-17 (×2): via INTRAVENOUS
  Administered 2016-12-18: 1000 mL via INTRAVENOUS
  Filled 2016-12-17 (×4): qty 1000

## 2016-12-17 MED ORDER — ADULT MULTIVITAMIN W/MINERALS CH: 1.0000 | ORAL_TABLET | Freq: Every day | ORAL | Status: DC

## 2016-12-17 MED ORDER — THIAMINE HCL 100 MG/ML IJ SOLN
100.0000 mg | Freq: Every day | INTRAMUSCULAR | Status: DC
  Administered 2016-12-18: 100 mg via INTRAVENOUS
  Filled 2016-12-17: qty 2

## 2016-12-17 MED ORDER — THIAMINE HCL 100 MG/ML IJ SOLN
Freq: Once | INTRAVENOUS | Status: AC
  Administered 2016-12-17: 21:00:00 via INTRAVENOUS
  Filled 2016-12-17: qty 1000

## 2016-12-17 MED ORDER — IOPAMIDOL (ISOVUE-300) INJECTION 61%
INTRAVENOUS | Status: AC
  Filled 2016-12-17: qty 30

## 2016-12-17 MED ORDER — THIAMINE HCL 100 MG/ML IJ SOLN: Freq: Every day | INTRAVENOUS | Status: DC

## 2016-12-17 MED ORDER — SODIUM CHLORIDE 0.9 % IV SOLN
8.0000 mg/h | INTRAVENOUS | Status: AC
  Administered 2016-12-17 – 2016-12-20 (×6): 8 mg/h via INTRAVENOUS
  Filled 2016-12-17 (×13): qty 80

## 2016-12-17 MED ORDER — HYDROCHLOROTHIAZIDE 25 MG PO TABS
25.0000 mg | ORAL_TABLET | Freq: Every day | ORAL | Status: DC
  Administered 2016-12-17: 25 mg via ORAL
  Filled 2016-12-17 (×5): qty 1

## 2016-12-17 MED ORDER — LORAZEPAM 2 MG/ML IJ SOLN
1.0000 mg | Freq: Four times a day (QID) | INTRAMUSCULAR | Status: DC | PRN
  Administered 2016-12-17: 1 mg via INTRAVENOUS
  Filled 2016-12-17: qty 1

## 2016-12-17 MED ORDER — PANTOPRAZOLE SODIUM 40 MG IV SOLR
40.0000 mg | Freq: Two times a day (BID) | INTRAVENOUS | Status: DC
  Administered 2016-12-20 – 2016-12-27 (×14): 40 mg via INTRAVENOUS
  Filled 2016-12-17 (×15): qty 40

## 2016-12-17 MED ORDER — DILTIAZEM LOAD VIA INFUSION
10.0000 mg | Freq: Once | INTRAVENOUS | Status: AC
  Administered 2016-12-17: 10 mg via INTRAVENOUS
  Filled 2016-12-17: qty 10

## 2016-12-17 MED ORDER — POTASSIUM CHLORIDE IN NACL 40-0.9 MEQ/L-% IV SOLN
INTRAVENOUS | Status: DC
  Administered 2016-12-17: 125 mL/h via INTRAVENOUS
  Filled 2016-12-17: qty 1000

## 2016-12-17 MED ORDER — SODIUM CHLORIDE 0.9 % IV SOLN
80.0000 mg | Freq: Once | INTRAVENOUS | Status: AC
  Administered 2016-12-17: 80 mg via INTRAVENOUS
  Filled 2016-12-17: qty 80

## 2016-12-17 MED ORDER — FOLIC ACID 1 MG PO TABS: 1.0000 mg | ORAL_TABLET | Freq: Every day | ORAL | Status: DC

## 2016-12-17 MED ORDER — DEXTROSE 5 % IV SOLN
5.0000 mg/h | INTRAVENOUS | Status: DC
  Administered 2016-12-17 – 2016-12-21 (×6): 5 mg/h via INTRAVENOUS
  Administered 2016-12-22: 7.5 mg/h via INTRAVENOUS
  Administered 2016-12-22: 5 mg/h via INTRAVENOUS
  Administered 2016-12-22: 10 mg/h via INTRAVENOUS
  Administered 2016-12-23: 15 mg/h via INTRAVENOUS
  Filled 2016-12-17 (×11): qty 100

## 2016-12-17 MED ORDER — ONDANSETRON HCL 4 MG PO TABS: 4.0000 mg | ORAL_TABLET | Freq: Four times a day (QID) | ORAL | Status: DC | PRN

## 2016-12-17 NOTE — ED Triage Notes (Signed)
TC to DR Evangeline Gula to change Shoulder to Left instead of RT.

## 2016-12-17 NOTE — ED Notes (Signed)
Oral contrast given to pt. by CT tech with instructions .

## 2016-12-17 NOTE — Plan of Care (Signed)
Problem: Bowel/Gastric: Goal: Will not experience complications related to bowel motility Outcome: Progressing Discussed plan of care with patient, pain management and nausea medications with some teach back displayed

## 2016-12-17 NOTE — H&P (Signed)
History and Physical    Nicole [name] [id number] DOB: 1950/05/28 DOA: 12/16/2016  PCP: Patient, No Pcp Per  Patient coming from: Home via private vehicle  I have personally briefly reviewed patient's old medical records in [place]  Chief Complaint: One week of constant nausea, constant vomiting, severe abdominal pain, and hematemesis.  HPI: Nicole [name] is a 66 y.o. female with medical history significant for arthritis for which she takes many over-the-counter nonsteroidals, no fracture x2 in 2008, gastric distress for which she takes over-the-counter Gas-X, possible TIA (a year ago when she woke up confused and did not know where she was which then resolved) who presents the emergency department complaining of nausea vomiting and hematemesis.  She has associated severe abdominal pain.  Symptoms began about a week ago when she had a drinking binge.  She has a history of heavy drinking and quit about 2 months ago.  A week ago she decided to celebrate and had a bit of the binge.  After that she felt bad and she had for 5 days of significant nausea vomiting hematemesis and abdominal pain.  Her abdominal pain is located in the left upper quadrant but radiates a little bit to her back and up into her chest.  She states the nausea and vomiting has been constant and she went to urgent care 2 days ago she was seen there her hemoglobin was 11 and she had a Gastroccult which was positive.  Plan was to refer her for further possible testing.  They started her on Carafate, iron, Zofran and Protonix.  She states that she did not get any significant relief with these medications and continued to have hematemesis nausea and vomiting with significant abdominal pain and she presented for that problem.  Pain was not improved with medications was worsened with eating and vomiting, pain is sharp in nature and stabbing-like, he has some radiation to her back, she has never had pain like this before.  He has  been unable to eat and drink.  Pain has been constant since it started several days ago.  ED Course: Patient was fully evaluated and a CT scan was obtained which showed a sliding type hiatal hernia up into her chest but also a possible gastric mass with with along the stomach margins.  Differential included tumor versus hematoma versus bezoar, patient was unable to have a full contrast CT scan due to a severe allergy to contrast dye.  Surgery was therefore consulted who recommended further evaluation with esophagogastroduodenoscopy.  GI Dr. Ala Dach was consulted for further evaluation and management by the emergency department and his evaluation is currently pending.  I was then consulted for further evaluation and admission.  Patient was noted to be in atrial fibrillation on the monitor.  EKG was obtained and confirmed atrial fibrillation with rapid ventricular response.  Troponin was 0.00 and the patient denied any chest pains.  Still complained of significant left shoulder pain shoulder films have been ordered.  This point patient will be admitted into the hospital for further evaluation and management of gastric mass, atrial fibrillation with rapid ventricular response, left shoulder pain, and persistent nausea and vomiting with hematemesis.  Review of Systems:   Review of Systems  Constitutional: Positive for malaise/fatigue and weight loss. Negative for chills and fever.  HENT: Negative for congestion, nosebleeds and sinus pain.   Eyes: Negative for blurred vision and double vision.  Respiratory: Negative for sputum production, shortness of breath and wheezing.   Cardiovascular: Negative  for chest pain, palpitations, orthopnea, leg swelling and PND.  Gastrointestinal: Positive for abdominal pain, heartburn, nausea and vomiting. Negative for blood in stool, constipation, diarrhea and melena.  Genitourinary: Negative for dysuria and urgency.  Musculoskeletal: Negative for back pain, myalgias and  neck pain.  Skin: Negative for itching and rash.  Neurological: Negative for dizziness, tingling, seizures and headaches.  Endo/Heme/Allergies: Negative for environmental allergies. Does not bruise/bleed easily.  Psychiatric/Behavioral: Negative for depression and suicidal ideas.    Past Medical History:  Diagnosis Date  . Arthritis   . Chronic alcoholism ([place])   . Chronic left shoulder pain   . Heartburn   . Hiatal hernia 12/17/2016   stomach noted in chest on CT scan  . Hypertension     Past Surgical History:  Procedure Laterality Date  . ANKLE FRACTURE SURGERY Right 2008   screws  . ANKLE FRACTURE SURGERY Left 2008   plates     reports that she has never smoked. She has never used smokeless tobacco. She reports that she drinks alcohol. She reports that she does not use drugs.   Social History   Social History  . Marital status: Single    Spouse name: N/A  . Number of children: N/A  . Years of education: N/A   Occupational History  . Not on file.   Social History Main Topics  . Smoking status: Never Smoker  . Smokeless tobacco: Never Used  . Alcohol use Yes     Comment: heavy  . Drug use: No  . Sexual activity: No   Other Topics Concern  . Not on file   Social History Narrative   quit drinking in late August but went on a binge over last weekend (october 26, 26, 28).  Hasn't drank since Sunday (6 days ago) due to abd pain, N, V    Social History   Social History Narrative   quit drinking in late August but went on a binge over last weekend (october 26, 26, 28).  Hasn't drank since Sunday (6 days ago) due to abd pain, N, V    Allergies  Allergen Reactions  . Other Shortness Of Breath and Anxiety    Reaction to fast food and processed food  . Shellfish Allergy Shortness Of Breath  . Iodinated Diagnostic Agents Swelling    Hand swelling from contrast dye for MRI    Family History  Problem Relation Age of Onset  . Heart attack Father   . Ulcers  Father   . Diabetes Sister   . Cancer Brother   . Diabetes Sister   . Diabetes Sister      Prior to Admission medications   Medication Sig Start Date End Date Taking? Authorizing Provider  acetaminophen (TYLENOL) 500 MG tablet Take 1,000 mg by mouth at bedtime.   Yes [provider]  Calcium Carb-Cholecalciferol (CALCIUM 600 + D PO) Take 600 mg by mouth daily with breakfast.   Yes [provider]  ferrous sulfate 325 (65 FE) MG tablet Take 325 mg by mouth daily with breakfast.   Yes [provider]  guaiFENesin (MUCINEX) 600 MG 12 hr tablet Take 600 mg by mouth daily.   Yes [provider]  ibuprofen (ADVIL,MOTRIN) 200 MG tablet Take 400 mg by mouth daily as needed (arthritis pain).   Yes [provider]  lisinopril-hydrochlorothiazide (PRINZIDE,ZESTORETIC) 20-25 MG tablet Take 1 tablet by mouth daily.   Yes [provider]  Multiple Vitamin (MULTIVITAMIN WITH MINERALS) TABS tablet Take  1 tablet by mouth daily with breakfast.   Yes [provider]  ondansetron (ZOFRAN-ODT) 8 MG disintegrating tablet Take 8 mg by mouth every 8 (eight) hours as needed for nausea or vomiting.   Yes [provider]  pantoprazole (PROTONIX) 40 MG tablet Take 40 mg by mouth daily.   Yes [provider]  phenylephrine (SUDAFED PE) 10 MG TABS tablet Take 10 mg by mouth at bedtime.   Yes [provider]  Propylene Glycol (SYSTANE COMPLETE) 0.6 % SOLN Place 1 drop into both eyes See admin instructions. Instill one drop into both eyes every morning, may also use later in the day as needed for dry eyes   Yes [provider]  sucralfate (CARAFATE) 1 g tablet Take 1 g by mouth See admin instructions. Take 1 tablet (1 g) by mouth 4 times daily - thirty minutes before meals and bedtime   Yes [provider]    Physical Exam: Vitals:   12/17/16 0830 12/17/16 0900 12/17/16 0909 12/17/16 0915  BP: 97/77 111/79  (!)  108/56  Pulse: (!) 134 (!) 113  (!) 115  Resp: (!) 22 16  19   Temp:      TempSrc:      SpO2: 94% 97% 97% 91%    Constitutional: NAD, calm, comfortable Vitals:   12/17/16 0830 12/17/16 0900 12/17/16 0909 12/17/16 0915  BP: 97/77 111/79  (!) 108/56  Pulse: (!) 134 (!) 113  (!) 115  Resp: (!) 22 16  19   Temp:      TempSrc:      SpO2: 94% 97% 97% 91%   Physical Exam  Constitutional: She is oriented to person, place, and time. She appears distressed.  HENT:  Head: Normocephalic and atraumatic.  Mouth/Throat: Oropharynx is clear and moist.  Eyes: Pupils are equal, round, and reactive to light. EOM are normal.  Conjunctiva are pale sclera are white  Neck: Normal range of motion. Neck supple. No JVD present. No tracheal deviation present. No thyromegaly present.  Cardiovascular: S1 normal, S2 normal and normal pulses.  An irregularly irregular rhythm present. Tachycardia present.  PMI is not displaced.   No murmur heard. Pulmonary/Chest: Effort normal and breath sounds normal. No respiratory distress. She has no wheezes. She has no rales. She exhibits no tenderness.  Abdominal: Soft. She exhibits no abdominal bruit and no ascites. Bowel sounds are hypoactive. There is tenderness in the left upper quadrant. There is no rigidity, no rebound, no guarding, no tenderness at McBurney's point and negative Murphy's sign.    Musculoskeletal: She exhibits tenderness (Left shoulder). She exhibits no edema.       Left shoulder: She exhibits decreased range of motion, tenderness and crepitus.  Neurological: She is alert and oriented to person, place, and time. No cranial nerve deficit. Coordination normal. GCS score is 15.  Skin: Skin is warm and dry. No rash noted. She is not diaphoretic.  Psychiatric: Affect and judgment normal.    Labs on Admission: I have personally reviewed following labs and imaging studies  CBC:  Recent Labs Lab 12/16/16 1658  WBC 12.7*  HGB 10.2*  HCT 31.8*  MCV  85.0  PLT 892*   Basic Metabolic Panel:  Recent Labs Lab 12/16/16 1658  NA 131*  K 3.2*  CL 95*  CO2 24  GLUCOSE 127*  BUN 18  CREATININE 0.74  CALCIUM 8.2*   GFR: CrCl cannot be calculated (Unknown ideal weight.). Liver Function Tests:  Recent Labs Lab 12/16/16 1658  AST 16  ALT 13*  ALKPHOS 94  BILITOT 1.0  PROT 6.0*  ALBUMIN 2.4*    Recent Labs Lab 12/16/16 1658  LIPASE 129*   No results for input(s): AMMONIA in the last 168 hours. Coagulation Profile: No results for input(s): INR, PROTIME in the last 168 hours.  Troponin (Point of Care Test)  Recent Labs  12/17/16 0907  TROPIPOC 0.00   BNP (last 3 results) No results for input(s): PROBNP in the last 8760 hours. HbA1C: No results for input(s): HGBA1C in the last 72 hours. CBG: No results for input(s): GLUCAP in the last 168 hours. Lipid Profile: No results for input(s): CHOL, HDL, LDLCALC, TRIG, CHOLHDL, LDLDIRECT in the last 72 hours. Thyroid Function Tests: No results for input(s): TSH, T4TOTAL, FREET4, T3FREE, THYROIDAB in the last 72 hours. Anemia Panel: No results for input(s): VITAMINB12, FOLATE, FERRITIN, TIBC, IRON, RETICCTPCT in the last 72 hours. Urine analysis:    Component Value Date/Time   COLORURINE YELLOW 12/17/2016 0200   APPEARANCEUR CLEAR 12/17/2016 0200   LABSPEC 1.020 12/17/2016 0200   PHURINE 6.0 12/17/2016 0200   GLUCOSEU NEGATIVE 12/17/2016 0200   HGBUR SMALL (A) 12/17/2016 0200   BILIRUBINUR NEGATIVE 12/17/2016 0200   KETONESUR 80 (A) 12/17/2016 0200   PROTEINUR 30 (A) 12/17/2016 0200   NITRITE NEGATIVE 12/17/2016 0200   LEUKOCYTESUR NEGATIVE 12/17/2016 0200    Radiological Exams on Admission: Ct Abdomen Pelvis Wo Contrast  Result Date: 12/17/2016 CLINICAL DATA:  Abdominal pain.  Nausea and vomiting for 4 days. EXAM: CT ABDOMEN AND PELVIS WITHOUT CONTRAST TECHNIQUE: Multidetector CT imaging of the abdomen and pelvis was performed following the standard protocol  without IV contrast. COMPARISON:  None. FINDINGS: Lower chest: Small left and trace right pleural effusion. Large hiatal hernia is only partially included in the field of view. Hepatobiliary: Low-density structure either within or abutting the left lobe of the liver is incontinuity with an ill-defined left upper quadrant fluid collection. No suspicious lesion on noncontrast exam. Gallbladder physiologically distended, no calcified stone. No biliary dilatation. Pancreas: Pancreas is poorly defined in the absence of IV contrast. Fluid within or extraluminal to the stomach abuts the region of the proximal pancreas. Spleen: Normal in size without focal abnormality. Adrenals/Urinary Tract: Left adrenal thickening. Normal right adrenal gland. There is a 6.2 cm low-density mass in the right kidney with central calcifications no hydronephrosis. No left hydronephrosis or focal renal lesion on noncontrast exam. Urinary bladder is in a small left upper quadrant irregular fluid collection. Stomach/bowel: Large hiatal hernia, majority of the stomach is intrathoracic. Administered enteric contrast outlines a large 12 cm homogeneous low-density gastric mass. This extends to the diaphragmatic hiatus. Adjacent to the hiatus in the upper abdomen is fluid that may be within a gastric diverticulum or fluid collection image 26 series 3. There is a separate 6.2 cm fluid collection abutting the distal stomach image 33 series 3. Associated soft tissue stranding in the right abdomen and left upper quadrant. Ill-defined left upper quadrant fluid anteriorly extends to the left lobe of the liver. No air within any of these fluid collections. There is no extraluminal enteric contrast. Despite the extraluminal fluid collections there is no extraluminal air. Enteric contrast opacifies normal-appearing duodenum and small bowel without obstruction or wall thickening. Normal appendix tentatively identified. No colonic wall thickening or  inflammation, diverticulosis noted from the splenic flexure distally. No diverticulitis. Vascular/Lymphatic: Dense aortic atherosclerosis. No aneurysm. Vascular structures particularly in the upper abdomen are not well-defined. Limited assessment for  upper abdominal adenopathy. Reproductive: Uterus and bilateral adnexa are unremarkable. Other: Perigastric fluid collections in the upper abdomen as described. Upper abdominal soft tissue stranding. Small amount of free fluid in the pelvis. No evidence of free air. Musculoskeletal: There are no acute or suspicious osseous abnormalities. Degenerative change in the spine and both hips. IMPRESSION: 1. Inflammatory process in the upper abdomen, appears centered on the stomach. There is a large hiatal hernia, which contains a large gastric filling defect, which may be a gastric mass, bezoar or less likely hematoma. This is in possible continuity with gastric diverticulum or perigastric fluid collection in the upper abdomen. There are separate perigastric fluid collections in the upper abdomen, with adjacent soft tissue stranding and inflammation. Despite the peri-gastric fluid collections there is no free air or extraluminal contrast. Underlying etiology is uncertain, however considerations include penetrating gastric mass with associated abscesses. Peptic ulcer disease with intragastric hematoma and contained perforations are also considered. Alternative sources of left upper quadrant fluid collection such as pancreatitis with pseudocyst are felt less likely. Recommend GI consultation, possible endoscopic evaluation. 2. Heterogeneous 6.2 cm right renal mass, complex cyst versus solid lesion. Recommend nonemergent characterization with renal protocol MRI after resolution of acute event. These results were called by telephone at the time of interpretation on 12/17/2016 at 3:34 am to Dr. Joseph Berkshire , who verbally acknowledged these results. Electronically Signed   By:  Jeb Levering M.D.   On: 12/17/2016 03:42   Dg Chest Port 1 View  Result Date: 12/17/2016 CLINICAL DATA:  Hematemesis. EXAM: PORTABLE CHEST 1 VIEW COMPARISON:  CT of the abdomen and pelvis from earlier today FINDINGS: A left retrocardiac masslike opacity correlates with the large hiatal hernia and gastric filling defect seen on the CT scan from earlier today. A small effusion with underlying atelectasis in the left base was also seen on recent CT imaging. No convincing evidence of acute pneumothorax. Oval lucency projected over the lateral left chest may be artifactual as there is no other evidence of pneumothorax. A small loculated chronic pneumothorax is not completely excluded given multiple old rib fractures on the left. The lungs are otherwise clear. The heart is not well assessed due to the overlying retrocardiac opacity. The hila and mediastinum are unremarkable. IMPRESSION: 1. The known left retrocardiac mass was better appreciated and characterized on recent CT imaging and is again identified. A small left effusion and underlying atelectasis remains. 2. A small oval lucency projected over the lateral left chest may be artifactual. A small chronic loculated pneumothorax is possible given the multiple adjacent healed rib fractures. Recommend clinical correlation. If the patient has acute chest pain, chest CT could be performed. Findings being called to ER doctor Electronically Signed   By: Dorise Bullion III M.D   On: 12/17/2016 08:57   Dg Shoulder Left Portable  Result Date: 12/17/2016 CLINICAL DATA:  66 year old female with recurrent left shoulder pain. Fall 1 year ago. EXAM: LEFT SHOULDER - 1 VIEW COMPARISON:  Chest radiographs and CT Abdomen and Pelvis today reported separately. FINDINGS: Portable AP and scapular Y-view of the left shoulder. No glenohumeral joint dislocation. Visible left humerus intact. Left clavicle and scapula appear intact. Veiling opacity at the left lung base. There are  displaced fractures of the lateral left fifth through seventh ribs but these were partially visible and appeared chronic on the CT today. IMPRESSION: 1.  No acute osseous abnormality identified.  At the left shoulder. 2. Abnormal left lung base, see CT Abdomen and  Pelvis report today. 3. Chronic appearing left lateral rib fractures. Electronically Signed   By: Genevie Ann M.D.   On: 12/17/2016 09:39    EKG: Independently reviewed.  Atrial fibrillation with rapid ventricular response at 168 bpm  Assessment/Plan Principal Problem:   Gastric mass Active Problems:   Atrial fibrillation with rapid ventricular response (HCC)   Right renal mass   Hyponatremia   Hypokalemia   Anemia   Diverticulosis of sigmoid colon   Hiatal hernia   Chronic alcoholism ([facility])   Chronic left shoulder pain   Arthritis  1.  Gastric mass: Patient will be admitted into the hospital.  She requires further evaluation of this very concerning mass.  General surgery has recommended EGD.  GI has been consulted.  Patient currently is n.p.o. except for meds with sips of water.  Further management pending results of EGD.  Will start Protonix drip, continue anti-emetics, type and screen.  2.  Atrial fibrillation with rapid ventricular response: Etiology of this is unclear but could be related to holiday heart, stress from acute illness, cardiac response to anemia from blood loss, myocardial infarction is possible but doubted his troponin is negative.  Patient will be admitted into the intermediate care unit on a diltiazem drip.  I have spoken to Dr. Debara Pickett from cardiology who will see the patient in consultation.  She will need her rate controlled prior to being able to undergo esophagogastroduodenoscopy.  She is not a candidate for anticoagulation given gastric mass with noted heme positive Gastroccult at outpatient ability on November 1.  Ideally she should be anticoagulated as it appears her chads 2 vascular score is a 2.  Hopefully we  can anticoagulate her once her GI issues are settled.  3.  Right renal mass: Patient will need further evaluation and quantification of this mass by renal protocol MRI once gastric issues and atrial fibrillation have been settled.  Is not an emergent workup but certainly needs to be attended to in the near future.  Will avoid nephrotoxic agents as able.  Creatinine normal.  4.  Hyponatremia: Likely related to an overindulgence of alcohol recently as well as persistent nausea and vomiting.  Hydrate patient and recheck sodium levels in a.m.  He does not appear to be fluid overloaded or water toxic.  5.  Hypokalemia: Patient will receive IV potassium as a bolus and then continued IV potassium in her IV fluids.  We will recheck potassium in a.m.  6.  Anemia: At this point this appears to be an acute anemia due to blood loss.  Her hemoglobin was 11 only a couple of days ago.  I am going to send iron studies.  He was started on iron at the outpatient facility.  Going to hold this right now given her acute stomach issues.  Type and screen the patient should she require transfusion.  Workup should had some light on the issues regarding her anemia.  7.  Diverticulosis of sigmoid colon: This was noted on CT scan obtained today of the abdomen.  This is a new diagnosis.  There is no acute diverticulitis.  This is noted patient's diet may need to be adjusted.  Have a high suspicion should the patient developed lower abdominal pain for acute diverticulitis.  8.  Hiatal hernia: Patient with a sliding type hiatal current hernia noted to be in her upper chest cavity.  This complicated the reading of her CT scan as did the inability to give her contrast.  GI will evaluate  the patient and hopefully perform an EGD once she is stable from a cardiac standpoint.  9.  Chronic alcoholism: Certainly patient's illness is directly related to her alcohol use she was using so much alcohol she started taking a great deal of  nonsteroidals and now has developed bleeding.  Also the possibility of a mass and if found to be a gastric cancer, these are associated with alcoholism.  Patient has cut back her alcohol use in the past 2 months but last weekend did have a bit of a binge.  He may require outpatient treatment for alcoholism.  Would certainly recommend AA.  Will ask social work for assistance.  10.  Chronic left shoulder pain: Patient has been taking lots of nonsteroidals due to this pain in her left shoulder.  She has subsequently developed abdominal pain and discomfort requiring use of over-the-counter heartburn medications.  Shoulder films are ordered.  She may require further evaluation by orthopedics once films are reviewed.  11.  Arthritis: Patient would benefit from outpatient evaluation of her arthritic complaints.  At this point will avoid nonsteroidals and prescribe Tylenol as able.  Try to avoid from narcotic medications.   DVT prophylaxis: SCDs Code Status: Full Family Communication: Spoke with patient's son at bedside discussed at length condition and plans for evaluation. Disposition Plan: Hopefully home Consults called:  General Surgery Dr. Redmond Pulling Gastroenterology Dr. Michail Sermon Cardiology Dr. Debara Pickett  Admission status: Inpatient   Lady Deutscher MD [facility] Pager [phone]  If 7PM-7AM, please contact night-coverage [email or web address] Password [facility]  12/17/2016, 9:48 AM

## 2016-12-17 NOTE — Consult Note (Signed)
Referring Provider: Dr. Betsey Holiday Primary Care Physician:  Patient, No Pcp Per Primary Gastroenterologist:  Althia Forts  Reason for Consultation:  Abdominal pain; Nausea/Vomiting  HPI: Nicole [name] is a 66 y.o. female with 5 days of severe crampy upper abdominal pain (6/10 in intensity) that has been constant since onset 5 days ago. Has been having recurrent vomiting for the last 5 days and has been unable to tolerate any food/liquids for 5 days. Has lost 20 pounds unintentionally over the last 6 months stating that she has had a poor appetite with early satiety for months. Has not been able to swallow for several days. No BM in the last 2 days. Has been taking 2 Ibuprofen daily for years for arthritis. Denies alcohol. CT scan showed large mass in the stomach of unclear etiology. Perigastric fluid collections noted without free air or extraluminal contrast. History of heartburn on Protonix 40 mg QD. Hgb 10.2. Afib and RVR and started on IV Cardizem in ER. No known history of ulcers. Dr. Redmond Pulling from [place] evaluated patient and did not see any emergent need for surgery. Son at bedside.  Past Medical History:  Diagnosis Date  . Arthritis   . Chronic alcoholism ([place])   . Chronic right shoulder pain   . Heartburn   . Hiatal hernia 12/17/2016   stomach noted in chest on CT scan  . Hypertension     Past Surgical History:  Procedure Laterality Date  . ANKLE FRACTURE SURGERY Right 2008   screws  . ANKLE FRACTURE SURGERY Left 2008   plates    Prior to Admission medications   Medication Sig Start Date End Date Taking? Authorizing Provider  acetaminophen (TYLENOL) 500 MG tablet Take 1,000 mg by mouth at bedtime.   Yes [provider]  Calcium Carb-Cholecalciferol (CALCIUM 600 + D PO) Take 600 mg by mouth daily with breakfast.   Yes [provider]  ferrous sulfate 325 (65 FE) MG tablet Take 325 mg by mouth daily with breakfast.   Yes [provider]  guaiFENesin (MUCINEX)  600 MG 12 hr tablet Take 600 mg by mouth daily.   Yes [provider]  ibuprofen (ADVIL,MOTRIN) 200 MG tablet Take 400 mg by mouth daily as needed (arthritis pain).   Yes [provider]  lisinopril-hydrochlorothiazide (PRINZIDE,ZESTORETIC) 20-25 MG tablet Take 1 tablet by mouth daily.   Yes [provider]  Multiple Vitamin (MULTIVITAMIN WITH MINERALS) TABS tablet Take 1 tablet by mouth daily with breakfast.   Yes [provider]  ondansetron (ZOFRAN-ODT) 8 MG disintegrating tablet Take 8 mg by mouth every 8 (eight) hours as needed for nausea or vomiting.   Yes [provider]  pantoprazole (PROTONIX) 40 MG tablet Take 40 mg by mouth daily.   Yes [provider]  phenylephrine (SUDAFED PE) 10 MG TABS tablet Take 10 mg by mouth at bedtime.   Yes [provider]  Propylene Glycol (SYSTANE COMPLETE) 0.6 % SOLN Place 1 drop into both eyes See admin instructions. Instill one drop into both eyes every morning, may also use later in the day as needed for dry eyes   Yes [provider]  sucralfate (CARAFATE) 1 g tablet Take 1 g by mouth See admin instructions. Take 1 tablet (1 g) by mouth 4 times daily - thirty minutes before meals and bedtime   Yes [provider]    Scheduled Meds: . Barium Sulfate      . diltiazem  10 mg Intravenous Once  . folic  acid  1 mg Oral Daily  . lisinopril  20 mg Oral Daily   And  . hydrochlorothiazide  25 mg Oral Daily  . iopamidol      . multivitamin with minerals  1 tablet Oral Daily  . [START ON 12/20/2016] pantoprazole  40 mg Intravenous Q12H  . thiamine  100 mg Oral Daily   Or  . thiamine  100 mg Intravenous Daily   Continuous Infusions: . 0.9 % NaCl with KCl 20 mEq / L    . 0.9 % NaCl with KCl 40 mEq / L    . diltiazem (CARDIZEM) infusion    . pantoprozole (PROTONIX) infusion Stopped (12/17/16 0823)  . banana bag IV 1000 mL     PRN Meds:.acetaminophen **OR** acetaminophen,  LORazepam **OR** LORazepam, ondansetron **OR** ondansetron (ZOFRAN) IV, oxyCODONE  Allergies as of 12/16/2016 - Review Complete 12/16/2016  Allergen Reaction Noted  . Other Shortness Of Breath and Anxiety 12/16/2016  . Shellfish allergy Shortness Of Breath 12/16/2016  . Iodinated diagnostic agents Swelling 12/16/2016    Family History  Problem Relation Age of Onset  . Heart attack Father   . Ulcers Father   . Diabetes Sister   . Cancer Brother   . Diabetes Sister   . Diabetes Sister     Social History   Social History  . Marital status: Single    Spouse name: N/A  . Number of children: N/A  . Years of education: N/A   Occupational History  . Not on file.   Social History Main Topics  . Smoking status: Never Smoker  . Smokeless tobacco: Never Used  . Alcohol use Yes     Comment: heavy  . Drug use: No  . Sexual activity: No   Other Topics Concern  . Not on file   Social History Narrative   quit drinking in late August but went on a binge over last weekend (october 26, 26, 28).  Hasn't drank since Sunday (6 days ago) due to abd pain, N, V    Review of Systems: All negative except as stated above in HPI.  Physical Exam: Vital signs: Vitals:   12/17/16 0830 12/17/16 0900  BP: 97/77 111/79  Pulse: (!) 134 (!) 113  Resp: (!) 22 16  Temp:    SpO2: 94% 97%  T 98.2   General:  Lethargic, elderly, obese, mild acute distress Head: normocephalic, atraumatic Eyes: anicteric sclera ENT: oropharynx clear Neck: supple, nontender Lungs:  Clear throughout to auscultation.   No wheezes, crackles, or rhonchi. No acute distress. Heart:  Irregularly irregular  Abdomen: diffuse tenderness with guarding (greatest in epigastric), soft, nondistended, +BS, obese  Rectal:  Deferred Ext: no edema  GI:  Lab Results:  Recent Labs  12/16/16 1658  WBC 12.7*  HGB 10.2*  HCT 31.8*  PLT 427*   BMET  Recent Labs  12/16/16 1658  NA 131*  K 3.2*  CL 95*  CO2 24   GLUCOSE 127*  BUN 18  CREATININE 0.74  CALCIUM 8.2*   LFT  Recent Labs  12/16/16 1658  PROT 6.0*  ALBUMIN 2.4*  AST 16  ALT 13*  ALKPHOS 94  BILITOT 1.0   PT/INR No results for input(s): LABPROT, INR in the last 72 hours.   Studies/Results: Ct Abdomen Pelvis Wo Contrast  Result Date: 12/17/2016 CLINICAL DATA:  Abdominal pain.  Nausea and vomiting for 4 days. EXAM: CT ABDOMEN AND PELVIS WITHOUT CONTRAST TECHNIQUE: Multidetector CT imaging of the abdomen and pelvis  was performed following the standard protocol without IV contrast. COMPARISON:  None. FINDINGS: Lower chest: Small left and trace right pleural effusion. Large hiatal hernia is only partially included in the field of view. Hepatobiliary: Low-density structure either within or abutting the left lobe of the liver is incontinuity with an ill-defined left upper quadrant fluid collection. No suspicious lesion on noncontrast exam. Gallbladder physiologically distended, no calcified stone. No biliary dilatation. Pancreas: Pancreas is poorly defined in the absence of IV contrast. Fluid within or extraluminal to the stomach abuts the region of the proximal pancreas. Spleen: Normal in size without focal abnormality. Adrenals/Urinary Tract: Left adrenal thickening. Normal right adrenal gland. There is a 6.2 cm low-density mass in the right kidney with central calcifications no hydronephrosis. No left hydronephrosis or focal renal lesion on noncontrast exam. Urinary bladder is in a small left upper quadrant irregular fluid collection. Stomach/bowel: Large hiatal hernia, majority of the stomach is intrathoracic. Administered enteric contrast outlines a large 12 cm homogeneous low-density gastric mass. This extends to the diaphragmatic hiatus. Adjacent to the hiatus in the upper abdomen is fluid that may be within a gastric diverticulum or fluid collection image 26 series 3. There is a separate 6.2 cm fluid collection abutting the distal stomach  image 33 series 3. Associated soft tissue stranding in the right abdomen and left upper quadrant. Ill-defined left upper quadrant fluid anteriorly extends to the left lobe of the liver. No air within any of these fluid collections. There is no extraluminal enteric contrast. Despite the extraluminal fluid collections there is no extraluminal air. Enteric contrast opacifies normal-appearing duodenum and small bowel without obstruction or wall thickening. Normal appendix tentatively identified. No colonic wall thickening or inflammation, diverticulosis noted from the splenic flexure distally. No diverticulitis. Vascular/Lymphatic: Dense aortic atherosclerosis. No aneurysm. Vascular structures particularly in the upper abdomen are not well-defined. Limited assessment for upper abdominal adenopathy. Reproductive: Uterus and bilateral adnexa are unremarkable. Other: Perigastric fluid collections in the upper abdomen as described. Upper abdominal soft tissue stranding. Small amount of free fluid in the pelvis. No evidence of free air. Musculoskeletal: There are no acute or suspicious osseous abnormalities. Degenerative change in the spine and both hips. IMPRESSION: 1. Inflammatory process in the upper abdomen, appears centered on the stomach. There is a large hiatal hernia, which contains a large gastric filling defect, which may be a gastric mass, bezoar or less likely hematoma. This is in possible continuity with gastric diverticulum or perigastric fluid collection in the upper abdomen. There are separate perigastric fluid collections in the upper abdomen, with adjacent soft tissue stranding and inflammation. Despite the peri-gastric fluid collections there is no free air or extraluminal contrast. Underlying etiology is uncertain, however considerations include penetrating gastric mass with associated abscesses. Peptic ulcer disease with intragastric hematoma and contained perforations are also considered. Alternative  sources of left upper quadrant fluid collection such as pancreatitis with pseudocyst are felt less likely. Recommend GI consultation, possible endoscopic evaluation. 2. Heterogeneous 6.2 cm right renal mass, complex cyst versus solid lesion. Recommend nonemergent characterization with renal protocol MRI after resolution of acute event. These results were called by telephone at the time of interpretation on 12/17/2016 at 3:34 am to Dr. Joseph Berkshire , who verbally acknowledged these results. Electronically Signed   By: Jeb Levering M.D.   On: 12/17/2016 03:42   Dg Chest Port 1 View  Result Date: 12/17/2016 CLINICAL DATA:  Hematemesis. EXAM: PORTABLE CHEST 1 VIEW COMPARISON:  CT of the abdomen and pelvis  from earlier today FINDINGS: A left retrocardiac masslike opacity correlates with the large hiatal hernia and gastric filling defect seen on the CT scan from earlier today. A small effusion with underlying atelectasis in the left base was also seen on recent CT imaging. No convincing evidence of acute pneumothorax. Oval lucency projected over the lateral left chest may be artifactual as there is no other evidence of pneumothorax. A small loculated chronic pneumothorax is not completely excluded given multiple old rib fractures on the left. The lungs are otherwise clear. The heart is not well assessed due to the overlying retrocardiac opacity. The hila and mediastinum are unremarkable. IMPRESSION: 1. The known left retrocardiac mass was better appreciated and characterized on recent CT imaging and is again identified. A small left effusion and underlying atelectasis remains. 2. A small oval lucency projected over the lateral left chest may be artifactual. A small chronic loculated pneumothorax is possible given the multiple adjacent healed rib fractures. Recommend clinical correlation. If the patient has acute chest pain, chest CT could be performed. Findings being called to ER doctor Electronically Signed    By: Dorise Bullion III M.D   On: 12/17/2016 08:57    Impression/Plan: Recurrent N/V/abdominal pain, early satiety and abnormal CT scan showing a gastric mass. Presentation concerning for gastric malignancy and less likely a bleeding ulcer. EGD when cardiovascular issues stable (Afib and RVR). Consider EGD tomorrow if Propofol sedation available by anesthesia otherwise will do EGD on 12/19/16. Ice chips and sips of water only by mouth. NPO p MN. Protonix drip. Supportive care.    LOS: 0 days   [place] C.  12/17/2016, 9:19 AM  Pager [phone]  AFTER 5 pm or on weekends please call [phone]

## 2016-12-17 NOTE — Consult Note (Signed)
CONSULTATION NOTE   Patient Name: Nicole [name] Date of Encounter: 12/17/2016 Cardiologist: None (NEW)  Chief Complaint   Atrial fibrillation  Patient Profile   Weakness, fatigue  HPI   Nicole [name] is a 66 y.o. female who is being seen today for the evaluation of atrial fibrillation at the request of Dr. Evangeline Gula. This is a middle-aged female with a history of chronic alcoholism, hiatal hernia, GERD, HTN and arthritis, who presents with about 1 week of vomiting, hemetemesis and abdominal pain.  She says she has not been able to keep any liquids or foods down in the last week and actually has not drank any alcohol in the last week either.  She has no history of withdrawal seizures or DTs.  On admission she was noted to be in a-fib with RVR, now on cardizem -this is new onset.  When I saw her in the emergency department she converted to sinus rhythm but is tachycardic and having frequent PACs and PVCs.  She has no known cardiac history although strong family history of coronary disease in her father, who had sudden cardiac death at age 25.  He was a heavy drinker and smoker.  PMHx   Past Medical History:  Diagnosis Date  . Arthritis   . Chronic alcoholism ([place])   . Chronic left shoulder pain   . Heartburn   . Hiatal hernia 12/17/2016   stomach noted in chest on CT scan  . Hypertension     Past Surgical History:  Procedure Laterality Date  . ANKLE FRACTURE SURGERY Right 2008   screws  . ANKLE FRACTURE SURGERY Left 2008   plates    FAMHx   Family History  Problem Relation Age of Onset  . Heart attack Father   . Ulcers Father   . Diabetes Sister   . Cancer Brother   . Diabetes Sister   . Diabetes Sister     SOCHx    reports that she has never smoked. She has never used smokeless tobacco. She reports that she drinks alcohol. She reports that she does not use drugs.  Outpatient Medications   No current facility-administered medications on file prior to encounter.     No current outpatient prescriptions on file prior to encounter.    Inpatient Medications    Scheduled Meds: . Barium Sulfate      . diltiazem  10 mg Intravenous Once  . folic acid  1 mg Oral Daily  . lisinopril  20 mg Oral Daily   And  . hydrochlorothiazide  25 mg Oral Daily  . iopamidol      . multivitamin with minerals  1 tablet Oral Daily  . [START ON 12/20/2016] pantoprazole  40 mg Intravenous Q12H  . thiamine  100 mg Oral Daily   Or  . thiamine  100 mg Intravenous Daily    Continuous Infusions: . 0.9 % NaCl with KCl 20 mEq / L    . 0.9 % NaCl with KCl 40 mEq / L    . diltiazem (CARDIZEM) infusion    . pantoprozole (PROTONIX) infusion Stopped (12/17/16 0823)  . banana bag IV 1000 mL      PRN Meds: acetaminophen **OR** acetaminophen, LORazepam **OR** LORazepam, ondansetron **OR** ondansetron (ZOFRAN) IV, oxyCODONE   ALLERGIES   Allergies  Allergen Reactions  . Other Shortness Of Breath and Anxiety    Reaction to fast food and processed food  . Shellfish Allergy Shortness Of Breath  . Iodinated Diagnostic Agents Swelling  Hand swelling from contrast dye for MRI    ROS   Pertinent items noted in HPI and remainder of comprehensive ROS otherwise negative.  Vitals   Vitals:   12/17/16 0830 12/17/16 0900 12/17/16 0909 12/17/16 0915  BP: 97/77 111/79  (!) 108/56  Pulse: (!) 134 (!) 113  (!) 115  Resp: (!) _0 Temp:      TempSrc:      SpO2: 94% 97% 97% 91%    Intake/Output Summary (Last 24 hours) at 12/17/16 0951 Last data filed at 12/17/16 5993  Gross per 24 hour  Intake             2150 ml  Output                0 ml  Net             2150 ml   There were no vitals filed for this visit.  Physical Exam   General appearance: alert, no distress and mildly obese Neck: no carotid bruit, no JVD and thyroid not enlarged, symmetric, no tenderness/mass/nodules Lungs: clear to auscultation bilaterally Heart: regular rate and rhythm and Occasional  ectopy Abdomen: soft, non-tender; bowel sounds normal; no masses,  no organomegaly Extremities: extremities normal, atraumatic, no cyanosis or edema Pulses: 2+ and symmetric Skin: Pale, cool, dry Neurologic: Grossly normal Psych: Pleasant  Labs   Results for orders placed or performed during the hospital encounter of 12/16/16 (from the past 48 hour(s))  Lipase, blood     Status: Abnormal   Collection Time: 12/16/16  4:58 PM  Result Value Ref Range   Lipase 129 (H) 11 - 51 U/L  Comprehensive metabolic panel     Status: Abnormal   Collection Time: 12/16/16  4:58 PM  Result Value Ref Range   Sodium 131 (L) 135 - 145 mmol/L   Potassium 3.2 (L) 3.5 - 5.1 mmol/L   Chloride 95 (L) 101 - 111 mmol/L   CO2 24 22 - 32 mmol/L   Glucose, Bld 127 (H) 65 - 99 mg/dL   BUN 18 6 - 20 mg/dL   Creatinine, Ser 0.74 0.44 - 1.00 mg/dL   Calcium 8.2 (L) 8.9 - 10.3 mg/dL   Total Protein 6.0 (L) 6.5 - 8.1 g/dL   Albumin 2.4 (L) 3.5 - 5.0 g/dL   AST 16 15 - 41 U/L   ALT 13 (L) 14 - 54 U/L   Alkaline Phosphatase 94 38 - 126 U/L   Total Bilirubin 1.0 0.3 - 1.2 mg/dL   GFR calc non Af Amer >60 >60 mL/min   GFR calc Af Amer >60 >60 mL/min    Comment: (NOTE) The eGFR has been calculated using the CKD EPI equation. This calculation has not been validated in all clinical situations. eGFR's persistently <60 mL/min signify possible Chronic Kidney Disease.    Anion gap 12 5 - 15  CBC     Status: Abnormal   Collection Time: 12/16/16  4:58 PM  Result Value Ref Range   WBC 12.7 (H) 4.0 - 10.5 K/uL   RBC 3.74 (L) 3.87 - 5.11 MIL/uL   Hemoglobin 10.2 (L) 12.0 - 15.0 g/dL   HCT 31.8 (L) 36.0 - 46.0 %   MCV 85.0 78.0 - 100.0 fL   MCH 27.3 26.0 - 34.0 pg   MCHC 32.1 30.0 - 36.0 g/dL   RDW 15.5 11.5 - 15.5 %   Platelets 427 (H) 150 - 400 K/uL  Urinalysis, Routine w  reflex microscopic     Status: Abnormal   Collection Time: 12/17/16  2:00 AM  Result Value Ref Range   Color, Urine YELLOW YELLOW   APPearance  CLEAR CLEAR   Specific Gravity, Urine 1.020 1.005 - 1.030   pH 6.0 5.0 - 8.0   Glucose, UA NEGATIVE NEGATIVE mg/dL   Hgb urine dipstick SMALL (A) NEGATIVE   Bilirubin Urine NEGATIVE NEGATIVE   Ketones, ur 80 (A) NEGATIVE mg/dL   Protein, ur 30 (A) NEGATIVE mg/dL   Nitrite NEGATIVE NEGATIVE   Leukocytes, UA NEGATIVE NEGATIVE   RBC / HPF 6-30 0 - 5 RBC/hpf   WBC, UA 0-5 0 - 5 WBC/hpf   Bacteria, UA RARE (A) NONE SEEN   Squamous Epithelial / LPF 0-5 (A) NONE SEEN   Mucus PRESENT   I-stat troponin, ED     Status: None   Collection Time: 12/17/16  9:07 AM  Result Value Ref Range   Troponin i, poc 0.00 0.00 - 0.08 ng/mL   Comment 3            Comment: Due to the release kinetics of cTnI, a negative result within the first hours of the onset of symptoms does not rule out myocardial infarction with certainty. If myocardial infarction is still suspected, repeat the test at appropriate intervals.     ECG   A-fib with RVR at 163, LPFB, NS twave changes - Personally Reviewed  Telemetry   Sinus with PACs and PVCs- Personally Reviewed  Radiology   Ct Abdomen Pelvis Wo Contrast  Result Date: 12/17/2016 CLINICAL DATA:  Abdominal pain.  Nausea and vomiting for 4 days. EXAM: CT ABDOMEN AND PELVIS WITHOUT CONTRAST TECHNIQUE: Multidetector CT imaging of the abdomen and pelvis was performed following the standard protocol without IV contrast. COMPARISON:  None. FINDINGS: Lower chest: Small left and trace right pleural effusion. Large hiatal hernia is only partially included in the field of view. Hepatobiliary: Low-density structure either within or abutting the left lobe of the liver is incontinuity with an ill-defined left upper quadrant fluid collection. No suspicious lesion on noncontrast exam. Gallbladder physiologically distended, no calcified stone. No biliary dilatation. Pancreas: Pancreas is poorly defined in the absence of IV contrast. Fluid within or extraluminal to the stomach abuts  the region of the proximal pancreas. Spleen: Normal in size without focal abnormality. Adrenals/Urinary Tract: Left adrenal thickening. Normal right adrenal gland. There is a 6.2 cm low-density mass in the right kidney with central calcifications no hydronephrosis. No left hydronephrosis or focal renal lesion on noncontrast exam. Urinary bladder is in a small left upper quadrant irregular fluid collection. Stomach/bowel: Large hiatal hernia, majority of the stomach is intrathoracic. Administered enteric contrast outlines a large 12 cm homogeneous low-density gastric mass. This extends to the diaphragmatic hiatus. Adjacent to the hiatus in the upper abdomen is fluid that may be within a gastric diverticulum or fluid collection image 26 series 3. There is a separate 6.2 cm fluid collection abutting the distal stomach image 33 series 3. Associated soft tissue stranding in the right abdomen and left upper quadrant. Ill-defined left upper quadrant fluid anteriorly extends to the left lobe of the liver. No air within any of these fluid collections. There is no extraluminal enteric contrast. Despite the extraluminal fluid collections there is no extraluminal air. Enteric contrast opacifies normal-appearing duodenum and small bowel without obstruction or wall thickening. Normal appendix tentatively identified. No colonic wall thickening or inflammation, diverticulosis noted from the splenic flexure distally. No diverticulitis.  Vascular/Lymphatic: Dense aortic atherosclerosis. No aneurysm. Vascular structures particularly in the upper abdomen are not well-defined. Limited assessment for upper abdominal adenopathy. Reproductive: Uterus and bilateral adnexa are unremarkable. Other: Perigastric fluid collections in the upper abdomen as described. Upper abdominal soft tissue stranding. Small amount of free fluid in the pelvis. No evidence of free air. Musculoskeletal: There are no acute or suspicious osseous abnormalities.  Degenerative change in the spine and both hips. IMPRESSION: 1. Inflammatory process in the upper abdomen, appears centered on the stomach. There is a large hiatal hernia, which contains a large gastric filling defect, which may be a gastric mass, bezoar or less likely hematoma. This is in possible continuity with gastric diverticulum or perigastric fluid collection in the upper abdomen. There are separate perigastric fluid collections in the upper abdomen, with adjacent soft tissue stranding and inflammation. Despite the peri-gastric fluid collections there is no free air or extraluminal contrast. Underlying etiology is uncertain, however considerations include penetrating gastric mass with associated abscesses. Peptic ulcer disease with intragastric hematoma and contained perforations are also considered. Alternative sources of left upper quadrant fluid collection such as pancreatitis with pseudocyst are felt less likely. Recommend GI consultation, possible endoscopic evaluation. 2. Heterogeneous 6.2 cm right renal mass, complex cyst versus solid lesion. Recommend nonemergent characterization with renal protocol MRI after resolution of acute event. These results were called by telephone at the time of interpretation on 12/17/2016 at 3:34 am to Dr. Joseph Berkshire , who verbally acknowledged these results. Electronically Signed   By: Jeb Levering M.D.   On: 12/17/2016 03:42   Dg Chest Port 1 View  Result Date: 12/17/2016 CLINICAL DATA:  Hematemesis. EXAM: PORTABLE CHEST 1 VIEW COMPARISON:  CT of the abdomen and pelvis from earlier today FINDINGS: A left retrocardiac masslike opacity correlates with the large hiatal hernia and gastric filling defect seen on the CT scan from earlier today. A small effusion with underlying atelectasis in the left base was also seen on recent CT imaging. No convincing evidence of acute pneumothorax. Oval lucency projected over the lateral left chest may be artifactual as  there is no other evidence of pneumothorax. A small loculated chronic pneumothorax is not completely excluded given multiple old rib fractures on the left. The lungs are otherwise clear. The heart is not well assessed due to the overlying retrocardiac opacity. The hila and mediastinum are unremarkable. IMPRESSION: 1. The known left retrocardiac mass was better appreciated and characterized on recent CT imaging and is again identified. A small left effusion and underlying atelectasis remains. 2. A small oval lucency projected over the lateral left chest may be artifactual. A small chronic loculated pneumothorax is possible given the multiple adjacent healed rib fractures. Recommend clinical correlation. If the patient has acute chest pain, chest CT could be performed. Findings being called to ER doctor Electronically Signed   By: Dorise Bullion III M.D   On: 12/17/2016 08:57   Dg Shoulder Left Portable  Result Date: 12/17/2016 CLINICAL DATA:  66 year old female with recurrent left shoulder pain. Fall 1 year ago. EXAM: LEFT SHOULDER - 1 VIEW COMPARISON:  Chest radiographs and CT Abdomen and Pelvis today reported separately. FINDINGS: Portable AP and scapular Y-view of the left shoulder. No glenohumeral joint dislocation. Visible left humerus intact. Left clavicle and scapula appear intact. Veiling opacity at the left lung base. There are displaced fractures of the lateral left fifth through seventh ribs but these were partially visible and appeared chronic on the CT today. IMPRESSION: 1.  No acute osseous abnormality identified.  At the left shoulder. 2. Abnormal left lung base, see CT Abdomen and Pelvis report today. 3. Chronic appearing left lateral rib fractures. Electronically Signed   By: Genevie Ann M.D.   On: 12/17/2016 09:39    Cardiac Studies   N/A  Impression   1. Principal Problem: 2.   Gastric mass 3. Active Problems: 4.   Right renal mass 5.   Diverticulosis of sigmoid colon 6.   Hiatal  hernia 7.   Chronic alcoholism ([place]) 8.   Chronic left shoulder pain 9.   Arthritis 10.   Hyponatremia 11.   Hypokalemia 12.   Anemia 13.   Atrial fibrillation with rapid ventricular response ([facility]) 14.   Recommendation   1. Mrs. Groome has new onset atrial fibrillation with rapid ventricular response.  She was placed on diltiazem and is now predominantly in sinus rhythm with PACs and PVCs.  It does not seem that she was aware of her atrial fibrillation.  In general she just feels very wiped out.  She says she has not slept in for 5 days and has been vomiting persistently with hematemesis as previously noted.  She is also not drank in the past week and has no history of withdrawal seizures or DTs.  Unfortunately given her hematemesis I would not recommend anticoagulation at this time.  Her CHADSVASC score from what I can tell is probably 16 (female, hypertension).  The A. fib may be related to vomiting, electrolyte abnormalities and/or her gastric mass.  She will likely need further workup for this and a planned EGD is in progress.  We will obtain an echocardiogram to evaluate LV function.  She denies any anginal symptoms and if her echo is reassuring, she should be a candidate for surgery if necessary.  For now continue IV diltiazem.  Thanks for the consultation.  Cardiology will follow closely with you.  Time Spent Directly with Patient:  I have spent a total of 45 minutes with the patient reviewing hospital notes, telemetry, EKGs, labs and examining the patient as well as establishing an assessment and plan that was discussed personally with the patient. > 50% of time was spent in direct patient care.  Length of Stay:  LOS: 0 days   Pixie Casino, MD, [place]  Attending Cardiologist  Direct Dial: [phone]  Fax: [phone]  Website: [email or web address] 12/17/2016, 9:51 AM

## 2016-12-17 NOTE — ED Notes (Signed)
Patient transported to CT 

## 2016-12-17 NOTE — Consult Note (Signed)
Reason for Consult:gastric mass Referring Physician: Dr Conni Slipper is an 66 y.o. female.  HPI: 66 year old female who does not go to the physician comes in with a 5-6-day history of continuous vomiting along with some hematemesis and upper abdominal pain.  She reports a one-year history of early satiety.  She states for the past 5 or 6 days she has not been able to keep any solid food down and really not any liquids down either.  She went to urgent care in [place] the other day and was evaluated and given IV fluids and nausea medicine.  She does report chronic NSAID use.  She takes about 2 tablets of ibuprofen daily for years mainly for joint pain.  She also reports drinking 2-3 alcoholic beverages per day for the past several years.  She denies any episodes of DTs or withdrawals.  She denies any prior abdominal surgery.  She denies any significant weight loss.  She denies any family history other than diabetes.  She has never had an upper endoscopy or lower endoscopy.  She does not smoke.  She denies any melena or hematochezia.  She also complains of weakness since she is not been able to eat.  She denies any fevers or chills.  Past Medical History:  Diagnosis Date  . Hypertension     History reviewed. No pertinent surgical history.  History reviewed. No pertinent family history.  Social History:  reports that she has never smoked. She has never used smokeless tobacco. Her alcohol and drug histories are not on file.  Allergies:  Allergies  Allergen Reactions  . Other Shortness Of Breath and Anxiety    Reaction to fast food and processed food  . Shellfish Allergy Shortness Of Breath  . Iodinated Diagnostic Agents Swelling    Hand swelling from contrast dye for MRI    Medications: I have reviewed the patient's current medications.  Results for orders placed or performed during the hospital encounter of 12/16/16 (from the past 48 hour(s))  Lipase, blood     Status: Abnormal   Collection Time: 12/16/16  4:58 PM  Result Value Ref Range   Lipase 129 (H) 11 - 51 U/L  Comprehensive metabolic panel     Status: Abnormal   Collection Time: 12/16/16  4:58 PM  Result Value Ref Range   Sodium 131 (L) 135 - 145 mmol/L   Potassium 3.2 (L) 3.5 - 5.1 mmol/L   Chloride 95 (L) 101 - 111 mmol/L   CO2 24 22 - 32 mmol/L   Glucose, Bld 127 (H) 65 - 99 mg/dL   BUN 18 6 - 20 mg/dL   Creatinine, Ser 0.74 0.44 - 1.00 mg/dL   Calcium 8.2 (L) 8.9 - 10.3 mg/dL   Total Protein 6.0 (L) 6.5 - 8.1 g/dL   Albumin 2.4 (L) 3.5 - 5.0 g/dL   AST 16 15 - 41 U/L   ALT 13 (L) 14 - 54 U/L   Alkaline Phosphatase 94 38 - 126 U/L   Total Bilirubin 1.0 0.3 - 1.2 mg/dL   GFR calc non Af Amer >60 >60 mL/min   GFR calc Af Amer >60 >60 mL/min    Comment: (NOTE) The eGFR has been calculated using the CKD EPI equation. This calculation has not been validated in all clinical situations. eGFR's persistently <60 mL/min signify possible Chronic Kidney Disease.    Anion gap 12 5 - 15  CBC     Status: Abnormal   Collection Time: 12/16/16  4:58  PM  Result Value Ref Range   WBC 12.7 (H) 4.0 - 10.5 K/uL   RBC 3.74 (L) 3.87 - 5.11 MIL/uL   Hemoglobin 10.2 (L) 12.0 - 15.0 g/dL   HCT 31.8 (L) 36.0 - 46.0 %   MCV 85.0 78.0 - 100.0 fL   MCH 27.3 26.0 - 34.0 pg   MCHC 32.1 30.0 - 36.0 g/dL   RDW 15.5 11.5 - 15.5 %   Platelets 427 (H) 150 - 400 K/uL  Urinalysis, Routine w reflex microscopic     Status: Abnormal   Collection Time: 12/17/16  2:00 AM  Result Value Ref Range   Color, Urine YELLOW YELLOW   APPearance CLEAR CLEAR   Specific Gravity, Urine 1.020 1.005 - 1.030   pH 6.0 5.0 - 8.0   Glucose, UA NEGATIVE NEGATIVE mg/dL   Hgb urine dipstick SMALL (A) NEGATIVE   Bilirubin Urine NEGATIVE NEGATIVE   Ketones, ur 80 (A) NEGATIVE mg/dL   Protein, ur 30 (A) NEGATIVE mg/dL   Nitrite NEGATIVE NEGATIVE   Leukocytes, UA NEGATIVE NEGATIVE   RBC / HPF 6-30 0 - 5 RBC/hpf   WBC, UA 0-5 0 - 5 WBC/hpf    Bacteria, UA RARE (A) NONE SEEN   Squamous Epithelial / LPF 0-5 (A) NONE SEEN   Mucus PRESENT     Ct Abdomen Pelvis Wo Contrast  Result Date: 12/17/2016 CLINICAL DATA:  Abdominal pain.  Nausea and vomiting for 4 days. EXAM: CT ABDOMEN AND PELVIS WITHOUT CONTRAST TECHNIQUE: Multidetector CT imaging of the abdomen and pelvis was performed following the standard protocol without IV contrast. COMPARISON:  None. FINDINGS: Lower chest: Small left and trace right pleural effusion. Large hiatal hernia is only partially included in the field of view. Hepatobiliary: Low-density structure either within or abutting the left lobe of the liver is incontinuity with an ill-defined left upper quadrant fluid collection. No suspicious lesion on noncontrast exam. Gallbladder physiologically distended, no calcified stone. No biliary dilatation. Pancreas: Pancreas is poorly defined in the absence of IV contrast. Fluid within or extraluminal to the stomach abuts the region of the proximal pancreas. Spleen: Normal in size without focal abnormality. Adrenals/Urinary Tract: Left adrenal thickening. Normal right adrenal gland. There is a 6.2 cm low-density mass in the right kidney with central calcifications no hydronephrosis. No left hydronephrosis or focal renal lesion on noncontrast exam. Urinary bladder is in a small left upper quadrant irregular fluid collection. Stomach/bowel: Large hiatal hernia, majority of the stomach is intrathoracic. Administered enteric contrast outlines a large 12 cm homogeneous low-density gastric mass. This extends to the diaphragmatic hiatus. Adjacent to the hiatus in the upper abdomen is fluid that may be within a gastric diverticulum or fluid collection image 26 series 3. There is a separate 6.2 cm fluid collection abutting the distal stomach image 33 series 3. Associated soft tissue stranding in the right abdomen and left upper quadrant. Ill-defined left upper quadrant fluid anteriorly extends to the  left lobe of the liver. No air within any of these fluid collections. There is no extraluminal enteric contrast. Despite the extraluminal fluid collections there is no extraluminal air. Enteric contrast opacifies normal-appearing duodenum and small bowel without obstruction or wall thickening. Normal appendix tentatively identified. No colonic wall thickening or inflammation, diverticulosis noted from the splenic flexure distally. No diverticulitis. Vascular/Lymphatic: Dense aortic atherosclerosis. No aneurysm. Vascular structures particularly in the upper abdomen are not well-defined. Limited assessment for upper abdominal adenopathy. Reproductive: Uterus and bilateral adnexa are unremarkable. Other: Perigastric fluid  collections in the upper abdomen as described. Upper abdominal soft tissue stranding. Small amount of free fluid in the pelvis. No evidence of free air. Musculoskeletal: There are no acute or suspicious osseous abnormalities. Degenerative change in the spine and both hips. IMPRESSION: 1. Inflammatory process in the upper abdomen, appears centered on the stomach. There is a large hiatal hernia, which contains a large gastric filling defect, which may be a gastric mass, bezoar or less likely hematoma. This is in possible continuity with gastric diverticulum or perigastric fluid collection in the upper abdomen. There are separate perigastric fluid collections in the upper abdomen, with adjacent soft tissue stranding and inflammation. Despite the peri-gastric fluid collections there is no free air or extraluminal contrast. Underlying etiology is uncertain, however considerations include penetrating gastric mass with associated abscesses. Peptic ulcer disease with intragastric hematoma and contained perforations are also considered. Alternative sources of left upper quadrant fluid collection such as pancreatitis with pseudocyst are felt less likely. Recommend GI consultation, possible endoscopic  evaluation. 2. Heterogeneous 6.2 cm right renal mass, complex cyst versus solid lesion. Recommend nonemergent characterization with renal protocol MRI after resolution of acute event. These results were called by telephone at the time of interpretation on 12/17/2016 at 3:34 am to Dr. Joseph Berkshire , who verbally acknowledged these results. Electronically Signed   By: Jeb Levering M.D.   On: 12/17/2016 03:42    Review of Systems  Constitutional: Negative for chills, fever and weight loss.  HENT: Negative for hearing loss and nosebleeds.   Eyes: Negative for blurred vision.  Respiratory: Negative for shortness of breath.   Cardiovascular: Negative for chest pain, palpitations, orthopnea and PND.       Denies DOE  Gastrointestinal: Positive for abdominal pain, nausea and vomiting.  Genitourinary: Negative for dysuria and hematuria.  Musculoskeletal: Positive for joint pain.  Skin: Negative for itching and rash.  Neurological: Positive for weakness. Negative for dizziness, focal weakness, seizures, loss of consciousness and headaches.       Denies TIAs, amaurosis fugax  Endo/Heme/Allergies: Does not bruise/bleed easily.  Psychiatric/Behavioral: The patient is not nervous/anxious.    Blood pressure 114/74, pulse (!) 107, temperature 98.2 F (36.8 C), temperature source Oral, resp. rate 18, SpO2 96 %. Physical Exam  Vitals reviewed. Constitutional: She is oriented to person, place, and time. She appears well-developed and well-nourished. No distress.  Obese, resting comfortably, nontoxic  HENT:  Head: Normocephalic and atraumatic.  Right Ear: External ear normal.  Left Ear: External ear normal.  Eyes: Conjunctivae are normal. No scleral icterus.  Neck: Normal range of motion. Neck supple. No tracheal deviation present. No thyromegaly present.  Cardiovascular: Normal rate and normal heart sounds.   Respiratory: Effort normal and breath sounds normal. No stridor. No respiratory  distress. She has no wheezes.  GI: Soft. She exhibits no distension. There is tenderness in the epigastric area and left upper quadrant. There is no rigidity, no rebound and no guarding. No hernia.    Epigastric/LUQ TTP; no rebound/guarding/peritonitis  Musculoskeletal: She exhibits no edema or tenderness.  Lymphadenopathy:    She has no cervical adenopathy.  Neurological: She is alert and oriented to person, place, and time. She exhibits normal muscle tone.  Skin: Skin is warm and dry. No rash noted. She is not diaphoretic. No erythema. No pallor.  Psychiatric: She has a normal mood and affect. Her behavior is normal. Judgment and thought content normal.    Assessment/Plan: Nausea, vomiting, hematemesis Large hiatal hernia Gastric mass Right  renal mass Anemia Protein calorie malnutrition Hypokalemia Alcohol use versus dependence  The etiology of the gastric mass is unclear.  Given her history of chronic NSAID use this could just be peptic ulcer disease with a large hematoma.  She is nontoxic appearing.  She is resting comfortably.  Other than some mild tachycardia she does not appear toxic.  She does not have rebound or guarding.  There is no evidence of extravasation of contrast or free air  I would recommend GI medicine consultation for upper endoscopy to get a more definitive direct visualization of this gastric mass.  Because if this is a gastric tumor this would completely change the management as well as surgical planning  She will need further workup of her right renal mass as well  Continue IV fluids And replace electrolytes Admit to medicine GI consult CIWA precautions  Leighton Ruff. Redmond Pulling, MD, FACS General, Bariatric, & Minimally Invasive Surgery [facility], [place]  [facility] M 12/17/2016, 6:46 AM

## 2016-12-17 NOTE — H&P (View-Only) (Signed)
Referring Provider: Dr. Betsey Holiday Primary Care Physician:  Patient, No Pcp Per Primary Gastroenterologist:  Althia Forts  Reason for Consultation:  Abdominal pain; Nausea/Vomiting  HPI: Nicole [name] is a 66 y.o. female with 5 days of severe crampy upper abdominal pain (6/10 in intensity) that has been constant since onset 5 days ago. Has been having recurrent vomiting for the last 5 days and has been unable to tolerate any food/liquids for 5 days. Has lost 20 pounds unintentionally over the last 6 months stating that she has had a poor appetite with early satiety for months. Has not been able to swallow for several days. No BM in the last 2 days. Has been taking 2 Ibuprofen daily for years for arthritis. Denies alcohol. CT scan showed large mass in the stomach of unclear etiology. Perigastric fluid collections noted without free air or extraluminal contrast. History of heartburn on Protonix 40 mg QD. Hgb 10.2. Afib and RVR and started on IV Cardizem in ER. No known history of ulcers. Dr. Redmond Pulling from [place] evaluated patient and did not see any emergent need for surgery. Son at bedside.  Past Medical History:  Diagnosis Date  . Arthritis   . Chronic alcoholism ([place])   . Chronic right shoulder pain   . Heartburn   . Hiatal hernia 12/17/2016   stomach noted in chest on CT scan  . Hypertension     Past Surgical History:  Procedure Laterality Date  . ANKLE FRACTURE SURGERY Right 2008   screws  . ANKLE FRACTURE SURGERY Left 2008   plates    Prior to Admission medications   Medication Sig Start Date End Date Taking? Authorizing Provider  acetaminophen (TYLENOL) 500 MG tablet Take 1,000 mg by mouth at bedtime.   Yes [provider]  Calcium Carb-Cholecalciferol (CALCIUM 600 + D PO) Take 600 mg by mouth daily with breakfast.   Yes [provider]  ferrous sulfate 325 (65 FE) MG tablet Take 325 mg by mouth daily with breakfast.   Yes [provider]  guaiFENesin (MUCINEX)  600 MG 12 hr tablet Take 600 mg by mouth daily.   Yes [provider]  ibuprofen (ADVIL,MOTRIN) 200 MG tablet Take 400 mg by mouth daily as needed (arthritis pain).   Yes [provider]  lisinopril-hydrochlorothiazide (PRINZIDE,ZESTORETIC) 20-25 MG tablet Take 1 tablet by mouth daily.   Yes [provider]  Multiple Vitamin (MULTIVITAMIN WITH MINERALS) TABS tablet Take 1 tablet by mouth daily with breakfast.   Yes [provider]  ondansetron (ZOFRAN-ODT) 8 MG disintegrating tablet Take 8 mg by mouth every 8 (eight) hours as needed for nausea or vomiting.   Yes [provider]  pantoprazole (PROTONIX) 40 MG tablet Take 40 mg by mouth daily.   Yes [provider]  phenylephrine (SUDAFED PE) 10 MG TABS tablet Take 10 mg by mouth at bedtime.   Yes [provider]  Propylene Glycol (SYSTANE COMPLETE) 0.6 % SOLN Place 1 drop into both eyes See admin instructions. Instill one drop into both eyes every morning, may also use later in the day as needed for dry eyes   Yes [provider]  sucralfate (CARAFATE) 1 g tablet Take 1 g by mouth See admin instructions. Take 1 tablet (1 g) by mouth 4 times daily - thirty minutes before meals and bedtime   Yes [provider]    Scheduled Meds: . Barium Sulfate      . diltiazem  10 mg Intravenous Once  . folic  acid  1 mg Oral Daily  . lisinopril  20 mg Oral Daily   And  . hydrochlorothiazide  25 mg Oral Daily  . iopamidol      . multivitamin with minerals  1 tablet Oral Daily  . [START ON 12/20/2016] pantoprazole  40 mg Intravenous Q12H  . thiamine  100 mg Oral Daily   Or  . thiamine  100 mg Intravenous Daily   Continuous Infusions: . 0.9 % NaCl with KCl 20 mEq / L    . 0.9 % NaCl with KCl 40 mEq / L    . diltiazem (CARDIZEM) infusion    . pantoprozole (PROTONIX) infusion Stopped (12/17/16 0823)  . banana bag IV 1000 mL     PRN Meds:.acetaminophen **OR** acetaminophen,  LORazepam **OR** LORazepam, ondansetron **OR** ondansetron (ZOFRAN) IV, oxyCODONE  Allergies as of 12/16/2016 - Review Complete 12/16/2016  Allergen Reaction Noted  . Other Shortness Of Breath and Anxiety 12/16/2016  . Shellfish allergy Shortness Of Breath 12/16/2016  . Iodinated diagnostic agents Swelling 12/16/2016    Family History  Problem Relation Age of Onset  . Heart attack Father   . Ulcers Father   . Diabetes Sister   . Cancer Brother   . Diabetes Sister   . Diabetes Sister     Social History   Social History  . Marital status: Single    Spouse name: N/A  . Number of children: N/A  . Years of education: N/A   Occupational History  . Not on file.   Social History Main Topics  . Smoking status: Never Smoker  . Smokeless tobacco: Never Used  . Alcohol use Yes     Comment: heavy  . Drug use: No  . Sexual activity: No   Other Topics Concern  . Not on file   Social History Narrative   quit drinking in late August but went on a binge over last weekend (october 26, 26, 28).  Hasn't drank since Sunday (6 days ago) due to abd pain, N, V    Review of Systems: All negative except as stated above in HPI.  Physical Exam: Vital signs: Vitals:   12/17/16 0830 12/17/16 0900  BP: 97/77 111/79  Pulse: (!) 134 (!) 113  Resp: (!) 22 16  Temp:    SpO2: 94% 97%  T 98.2   General:  Lethargic, elderly, obese, mild acute distress Head: normocephalic, atraumatic Eyes: anicteric sclera ENT: oropharynx clear Neck: supple, nontender Lungs:  Clear throughout to auscultation.   No wheezes, crackles, or rhonchi. No acute distress. Heart:  Irregularly irregular  Abdomen: diffuse tenderness with guarding (greatest in epigastric), soft, nondistended, +BS, obese  Rectal:  Deferred Ext: no edema  GI:  Lab Results:  Recent Labs  12/16/16 1658  WBC 12.7*  HGB 10.2*  HCT 31.8*  PLT 427*   BMET  Recent Labs  12/16/16 1658  NA 131*  K 3.2*  CL 95*  CO2 24   GLUCOSE 127*  BUN 18  CREATININE 0.74  CALCIUM 8.2*   LFT  Recent Labs  12/16/16 1658  PROT 6.0*  ALBUMIN 2.4*  AST 16  ALT 13*  ALKPHOS 94  BILITOT 1.0   PT/INR No results for input(s): LABPROT, INR in the last 72 hours.   Studies/Results: Ct Abdomen Pelvis Wo Contrast  Result Date: 12/17/2016 CLINICAL DATA:  Abdominal pain.  Nausea and vomiting for 4 days. EXAM: CT ABDOMEN AND PELVIS WITHOUT CONTRAST TECHNIQUE: Multidetector CT imaging of the abdomen and pelvis  was performed following the standard protocol without IV contrast. COMPARISON:  None. FINDINGS: Lower chest: Small left and trace right pleural effusion. Large hiatal hernia is only partially included in the field of view. Hepatobiliary: Low-density structure either within or abutting the left lobe of the liver is incontinuity with an ill-defined left upper quadrant fluid collection. No suspicious lesion on noncontrast exam. Gallbladder physiologically distended, no calcified stone. No biliary dilatation. Pancreas: Pancreas is poorly defined in the absence of IV contrast. Fluid within or extraluminal to the stomach abuts the region of the proximal pancreas. Spleen: Normal in size without focal abnormality. Adrenals/Urinary Tract: Left adrenal thickening. Normal right adrenal gland. There is a 6.2 cm low-density mass in the right kidney with central calcifications no hydronephrosis. No left hydronephrosis or focal renal lesion on noncontrast exam. Urinary bladder is in a small left upper quadrant irregular fluid collection. Stomach/bowel: Large hiatal hernia, majority of the stomach is intrathoracic. Administered enteric contrast outlines a large 12 cm homogeneous low-density gastric mass. This extends to the diaphragmatic hiatus. Adjacent to the hiatus in the upper abdomen is fluid that may be within a gastric diverticulum or fluid collection image 26 series 3. There is a separate 6.2 cm fluid collection abutting the distal stomach  image 33 series 3. Associated soft tissue stranding in the right abdomen and left upper quadrant. Ill-defined left upper quadrant fluid anteriorly extends to the left lobe of the liver. No air within any of these fluid collections. There is no extraluminal enteric contrast. Despite the extraluminal fluid collections there is no extraluminal air. Enteric contrast opacifies normal-appearing duodenum and small bowel without obstruction or wall thickening. Normal appendix tentatively identified. No colonic wall thickening or inflammation, diverticulosis noted from the splenic flexure distally. No diverticulitis. Vascular/Lymphatic: Dense aortic atherosclerosis. No aneurysm. Vascular structures particularly in the upper abdomen are not well-defined. Limited assessment for upper abdominal adenopathy. Reproductive: Uterus and bilateral adnexa are unremarkable. Other: Perigastric fluid collections in the upper abdomen as described. Upper abdominal soft tissue stranding. Small amount of free fluid in the pelvis. No evidence of free air. Musculoskeletal: There are no acute or suspicious osseous abnormalities. Degenerative change in the spine and both hips. IMPRESSION: 1. Inflammatory process in the upper abdomen, appears centered on the stomach. There is a large hiatal hernia, which contains a large gastric filling defect, which may be a gastric mass, bezoar or less likely hematoma. This is in possible continuity with gastric diverticulum or perigastric fluid collection in the upper abdomen. There are separate perigastric fluid collections in the upper abdomen, with adjacent soft tissue stranding and inflammation. Despite the peri-gastric fluid collections there is no free air or extraluminal contrast. Underlying etiology is uncertain, however considerations include penetrating gastric mass with associated abscesses. Peptic ulcer disease with intragastric hematoma and contained perforations are also considered. Alternative  sources of left upper quadrant fluid collection such as pancreatitis with pseudocyst are felt less likely. Recommend GI consultation, possible endoscopic evaluation. 2. Heterogeneous 6.2 cm right renal mass, complex cyst versus solid lesion. Recommend nonemergent characterization with renal protocol MRI after resolution of acute event. These results were called by telephone at the time of interpretation on 12/17/2016 at 3:34 am to Dr. Joseph Berkshire , who verbally acknowledged these results. Electronically Signed   By: Jeb Levering M.D.   On: 12/17/2016 03:42   Dg Chest Port 1 View  Result Date: 12/17/2016 CLINICAL DATA:  Hematemesis. EXAM: PORTABLE CHEST 1 VIEW COMPARISON:  CT of the abdomen and pelvis  from earlier today FINDINGS: A left retrocardiac masslike opacity correlates with the large hiatal hernia and gastric filling defect seen on the CT scan from earlier today. A small effusion with underlying atelectasis in the left base was also seen on recent CT imaging. No convincing evidence of acute pneumothorax. Oval lucency projected over the lateral left chest may be artifactual as there is no other evidence of pneumothorax. A small loculated chronic pneumothorax is not completely excluded given multiple old rib fractures on the left. The lungs are otherwise clear. The heart is not well assessed due to the overlying retrocardiac opacity. The hila and mediastinum are unremarkable. IMPRESSION: 1. The known left retrocardiac mass was better appreciated and characterized on recent CT imaging and is again identified. A small left effusion and underlying atelectasis remains. 2. A small oval lucency projected over the lateral left chest may be artifactual. A small chronic loculated pneumothorax is possible given the multiple adjacent healed rib fractures. Recommend clinical correlation. If the patient has acute chest pain, chest CT could be performed. Findings being called to ER doctor Electronically Signed    By: Dorise Bullion III M.D   On: 12/17/2016 08:57    Impression/Plan: Recurrent N/V/abdominal pain, early satiety and abnormal CT scan showing a gastric mass. Presentation concerning for gastric malignancy and less likely a bleeding ulcer. EGD when cardiovascular issues stable (Afib and RVR). Consider EGD tomorrow if Propofol sedation available by anesthesia otherwise will do EGD on 12/19/16. Ice chips and sips of water only by mouth. NPO p MN. Protonix drip. Supportive care.    LOS: 0 days   [place] [facility].  12/17/2016, 9:19 AM  Pager [phone]  AFTER 5 pm or on weekends please call [phone]

## 2016-12-18 ENCOUNTER — Other Ambulatory Visit: Payer: Self-pay

## 2016-12-18 ENCOUNTER — Inpatient Hospital Stay (HOSPITAL_COMMUNITY): Payer: Medicare Other

## 2016-12-18 ENCOUNTER — Encounter (HOSPITAL_COMMUNITY): Payer: Self-pay | Admitting: Certified Registered"

## 2016-12-18 ENCOUNTER — Encounter (HOSPITAL_COMMUNITY)
Admission: EM | Disposition: A | Discharge status: Other Acute Inpatient Hospital | Payer: Self-pay | Source: Home / Self Care | Attending: Pulmonary Disease

## 2016-12-18 DIAGNOSIS — R10816 Epigastric abdominal tenderness: Secondary | ICD-10-CM | POA: Diagnosis present

## 2016-12-18 HISTORY — PX: ESOPHAGOGASTRODUODENOSCOPY (EGD) WITH PROPOFOL: SHX5813

## 2016-12-18 LAB — CBC
HEMATOCRIT: 29.5 % — AB (ref 36.0–46.0)
HEMOGLOBIN: 9 g/dL — AB (ref 12.0–15.0)
MCH: 26.5 pg (ref 26.0–34.0)
MCHC: 30.5 g/dL (ref 30.0–36.0)
MCV: 87 fL (ref 78.0–100.0)
Platelets: 397 10*3/uL (ref 150–400)
RBC: 3.39 MIL/uL — AB (ref 3.87–5.11)
RDW: 15.6 % — ABNORMAL HIGH (ref 11.5–15.5)
WBC: 8.9 10*3/uL (ref 4.0–10.5)

## 2016-12-18 LAB — GLUCOSE, CAPILLARY
GLUCOSE-CAPILLARY: 87 mg/dL (ref 65–99)
GLUCOSE-CAPILLARY: 136 mg/dL — AB (ref 65–99)
Glucose-Capillary: 87 mg/dL (ref 65–99)
Glucose-Capillary: 85 mg/dL (ref 65–99)
Glucose-Capillary: 146 mg/dL — ABNORMAL HIGH (ref 65–99)

## 2016-12-18 LAB — BASIC METABOLIC PANEL
ANION GAP: 11 (ref 5–15)
BUN: 11 mg/dL (ref 6–20)
CHLORIDE: 103 mmol/L (ref 101–111)
CO2: 22 mmol/L (ref 22–32)
Calcium: 7.7 mg/dL — ABNORMAL LOW (ref 8.9–10.3)
Creatinine, Ser: 0.65 mg/dL (ref 0.44–1.00)
GFR calc Af Amer: >60 mL/min (ref >60)
GFR calc non Af Amer: >60 mL/min (ref >60)
Glucose, Bld: 98 mg/dL (ref 65–99)
POTASSIUM: 3 mmol/L — AB (ref 3.5–5.1)
SODIUM: 136 mmol/L (ref 135–145)

## 2016-12-18 LAB — MAGNESIUM: Magnesium: 1.7 mg/dL (ref 1.7–2.4)

## 2016-12-18 LAB — MRSA PCR SCREENING: MRSA by PCR: NEGATIVE

## 2016-12-18 LAB — PHOSPHORUS: PHOSPHORUS: 2.3 mg/dL — AB (ref 2.5–4.6)

## 2016-12-18 SURGERY — ESOPHAGOGASTRODUODENOSCOPY (EGD) WITH PROPOFOL
Anesthesia: Monitor Anesthesia Care

## 2016-12-18 MED ORDER — POTASSIUM CHLORIDE 10 MEQ/100ML IV SOLN
10.0000 meq | INTRAVENOUS | Status: AC
  Administered 2016-12-18 (×3): 10 meq via INTRAVENOUS
  Filled 2016-12-18 (×3): qty 100

## 2016-12-18 MED ORDER — ORAL CARE MOUTH RINSE
15.0000 mL | Freq: Two times a day (BID) | OROMUCOSAL | Status: DC
  Administered 2016-12-18 – 2016-12-23 (×7): 15 mL via OROMUCOSAL

## 2016-12-18 MED ORDER — POTASSIUM CHLORIDE IN NACL 20-0.9 MEQ/L-% IV SOLN
INTRAVENOUS | Status: DC
  Filled 2016-12-18: qty 1000

## 2016-12-18 MED ORDER — SODIUM CHLORIDE 0.9% FLUSH
10.0000 mL | INTRAVENOUS | Status: DC | PRN
  Administered 2016-12-19 – 2016-12-26 (×3): 10 mL
  Filled 2016-12-18 (×3): qty 40

## 2016-12-18 MED ORDER — INSULIN ASPART 100 UNIT/ML ~~LOC~~ SOLN
0.0000 [IU] | Freq: Four times a day (QID) | SUBCUTANEOUS | Status: DC
  Administered 2016-12-18: 1 [IU] via SUBCUTANEOUS
  Administered 2016-12-19: 2 [IU] via SUBCUTANEOUS

## 2016-12-18 MED ORDER — SODIUM CHLORIDE 0.9% FLUSH
10.0000 mL | Freq: Two times a day (BID) | INTRAVENOUS | Status: DC
  Administered 2016-12-18: 20 mL
  Administered 2016-12-19 – 2016-12-26 (×9): 10 mL

## 2016-12-18 MED ORDER — TRAVASOL 10 % IV SOLN
INTRAVENOUS | Status: AC
  Administered 2016-12-18: 17:00:00 via INTRAVENOUS
  Filled 2016-12-18: qty 495.6

## 2016-12-18 MED ORDER — CHLORHEXIDINE GLUCONATE CLOTH 2 % EX PADS: 6.0000 | MEDICATED_PAD | Freq: Every day | CUTANEOUS | Status: DC

## 2016-12-18 MED ORDER — PHENYLEPHRINE 40 MCG/ML (10ML) SYRINGE FOR IV PUSH (FOR BLOOD PRESSURE SUPPORT)
PREFILLED_SYRINGE | INTRAVENOUS | Status: DC | PRN
  Administered 2016-12-18: 80 ug via INTRAVENOUS

## 2016-12-18 MED ORDER — MORPHINE SULFATE (PF) 4 MG/ML IV SOLN
1.0000 mg | INTRAVENOUS | Status: DC | PRN
  Administered 2016-12-18 – 2016-12-21 (×13): 1 mg via INTRAVENOUS
  Filled 2016-12-18 (×15): qty 1

## 2016-12-18 MED ORDER — POTASSIUM CHLORIDE IN NACL 40-0.9 MEQ/L-% IV SOLN
INTRAVENOUS | Status: AC
  Administered 2016-12-18 – 2016-12-19 (×3): 75 mL/h via INTRAVENOUS
  Filled 2016-12-18 (×3): qty 1000

## 2016-12-18 MED ORDER — LACTATED RINGERS IV SOLN
INTRAVENOUS | Status: DC | PRN
  Administered 2016-12-18: 08:00:00 via INTRAVENOUS

## 2016-12-18 MED ORDER — PROPOFOL 500 MG/50ML IV EMUL
INTRAVENOUS | Status: DC | PRN
  Administered 2016-12-18: 150 ug/kg/min via INTRAVENOUS

## 2016-12-18 MED ORDER — TRAVASOL 10 % IV SOLN
INTRAVENOUS | Status: DC
  Filled 2016-12-18: qty 495.6

## 2016-12-18 SURGICAL SUPPLY — 14 items

## 2016-12-18 NOTE — Progress Notes (Signed)
Peripherally Inserted Central Catheter/Midline Placement  The IV Nurse has discussed with the patient and/or persons authorized to consent for the patient, the purpose of this procedure and the potential benefits and risks involved with this procedure.  The benefits include less needle sticks, lab draws from the catheter, and the patient may be discharged home with the catheter. Risks include, but not limited to, infection, bleeding, blood clot (thrombus formation), and puncture of an artery; nerve damage and irregular heartbeat and possibility to perform a PICC exchange if needed/ordered by physician.  Alternatives to this procedure were also discussed.  Bard Power PICC patient education guide, fact sheet on infection prevention and patient information card has been provided to patient /or left at bedside.  Pt A&OX4, verbalizes understanding of PICC and uses.  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 Able to follow al conversation appropriately without drowsiness noted.   PICC/Midline Placement Documentation  PICC Double Lumen 76/73/41 PICC Right Basilic 40 cm 1 cm (Active)  Indication for Insertion or Continuance of Line Administration of hyperosmolar/irritating solutions (i.e. TPN, Vancomycin, etc.) 12/18/2016 12:31 PM  Exposed Catheter (cm) 1 cm 12/18/2016 12:31 PM  Site Assessment Clean;Dry;Intact 12/18/2016 12:31 PM  Lumen #1 Status Flushed;Saline locked;Blood return noted 12/18/2016 12:31 PM  Lumen #2 Status Flushed;Saline locked;Blood return noted 12/18/2016 12:31 PM  Dressing Type Transparent 12/18/2016 12:31 PM  Dressing Status Clean;Dry;Intact;Antimicrobial disc in place 12/18/2016 12:31 PM  Line Care Connections checked and tightened 12/18/2016 12:31 PM  Line Adjustment (NICU/IV Team Only) No 12/18/2016 12:31 PM  Dressing Intervention New dressing 12/18/2016 12:31 PM  Dressing Change Due 12/25/16 12/18/2016 12:31 PM       Rolena Infante 12/18/2016, 12:33 PM

## 2016-12-18 NOTE — Progress Notes (Addendum)
Patient ID: Nicole [name], female   DOB: [birth year], 66 y.o.   MRN: [id number]   Acute Care Surgery Service Progress Note:    Chief Complaint/Subjective: Did well in endo Reports 2 small episodes yesterday  Objective: Vital signs in last 24 hours: Temp:  [97.8 F (36.6 C)-98.6 F (37 C)] 98.6 F (37 C) (11/04 0840) Pulse Rate:  [87-110] 92 (11/04 0700) Resp:  [12-38] 24 (11/04 0840) BP: (102-145)/(64-83) 102/78 (11/04 0840) SpO2:  [92 %-100 %] 100 % (11/04 0840) Weight:  [74.4 kg (164 lb)-74.6 kg (164 lb 7.4 oz)] 74.4 kg (164 lb) (11/04 0747) Last BM Date: 12/16/16  Intake/Output from previous day: 11/03 0701 - 11/04 0700 In: 2233.8 [I.V.:2233.8] Out: 1050 [Urine:1050] Intake/Output this shift: No intake/output data recorded.  Lungs: cta, nonlabored  Cardiovascular: mild tachy  Abd: soft, min to NT, obese  Extremities: no edema, +SCDs  Neuro: alert, nonfocal  Lab Results: CBC  Recent Labs    12/16/16 1658 12/18/16 0527  WBC 12.7* 8.9  HGB 10.2* 9.0*  HCT 31.8* 29.5*  PLT 427* 397   BMET Recent Labs    12/16/16 1658  NA 131*  K 3.2*  CL 95*  CO2 24  GLUCOSE 127*  BUN 18  CREATININE 0.74  CALCIUM 8.2*   LFT Hepatic Function Latest Ref Rng & Units 12/16/2016  Total Protein 6.5 - 8.1 g/dL 6.0(L)  Albumin 3.5 - 5.0 g/dL 2.4(L)  AST 15 - 41 U/L 16  ALT 14 - 54 U/L 13(L)  Alk Phosphatase 38 - 126 U/L 94  Total Bilirubin 0.3 - 1.2 mg/dL 1.0   PT/INR No results for input(s): LABPROT, INR in the last 72 hours. ABG No results for input(s): PHART, HCO3 in the last 72 hours.  Invalid input(s): PCO2, PO2  Studies/Results:  Anti-infectives: Anti-infectives (From admission, onward)   Start     Dose/Rate Route Frequency Ordered Stop   12/17/16 0515  piperacillin-tazobactam (ZOSYN) IVPB 3.375 g     3.375 g 100 mL/hr over 30 Minutes Intravenous  Once 12/17/16 0503 12/17/16 0636      Medications: Scheduled Meds: . folic acid  1 mg Oral Daily  .  lisinopril  20 mg Oral Daily   And  . hydrochlorothiazide  25 mg Oral Daily  . [START ON 12/20/2016] Influenza vac split quadrivalent PF  0.5 mL Intramuscular Tomorrow-1000  . multivitamin with minerals  1 tablet Oral Daily  . [START ON 12/20/2016] pantoprazole  40 mg Intravenous Q12H  . thiamine  100 mg Oral Daily   Or  . thiamine  100 mg Intravenous Daily   Continuous Infusions: . 0.9 % NaCl with KCl 20 mEq / L 1,000 mL (12/18/16 0901)  . diltiazem (CARDIZEM) infusion 5 mg/hr (12/18/16 0553)  . pantoprozole (PROTONIX) infusion 8 mg/hr (12/18/16 0949)   PRN Meds:.acetaminophen **OR** acetaminophen, LORazepam **OR** LORazepam, ondansetron **OR** ondansetron (ZOFRAN) IV, oxyCODONE  Assessment/Plan: Patient Active Problem List   Diagnosis Date Noted  . Epigastric abdominal tenderness 12/18/2016  . Gastric mass 12/17/2016    Class: Acute  . Right renal mass 12/17/2016    Class: Acute  . Diverticulosis of sigmoid colon 12/17/2016  . Hiatal hernia 12/17/2016    Class: Acute  . Chronic alcoholism ([place]) 12/17/2016    Class: Chronic  . Chronic left shoulder pain 12/17/2016    Class: Chronic  . Arthritis 12/17/2016    Class: Chronic  . Hyponatremia 12/17/2016    Class: Acute  . Hypokalemia 12/17/2016    Class:  Acute  . Anemia 12/17/2016  . Atrial fibrillation with rapid ventricular response ([place]) 12/17/2016    Class: Acute   s/p Procedure(s): ESOPHAGOGASTRODUODENOSCOPY (EGD) WITH PROPOFOL 12/18/2016  Nausea, vomiting, hematemesis Large hiatal hernia Large Gastric mass causing gastric outlet obstruction Right renal mass Anemia Protein calorie malnutrition Hypokalemia Alcohol use versus dependence  Discussed egd findings with pt Surgical plan truly depends on bx results - if GIST - could consider gleevac to try to shrink but would need surgical J tube for enteral feeds; if an adenocarcinoma - treatment plan would be based on addl workup - would need met workup  Renal mass  will also need workup - would prob hold on addl imaging until get egd bx back to determine what imaging to order  In interim, start PICC/TPN for malnutrition Ice chips for comfort If has ongoing emesis may need NG   Disposition:  LOS: 1 day    Leighton Ruff. Redmond Pulling, MD, FACS General, Bariatric, & Minimally Invasive Surgery [phone] [facility]

## 2016-12-18 NOTE — Op Note (Signed)
[facility] Patient Name: Nicole [name] Procedure Date : 12/18/2016 MRN: [id number] Attending MD: Lear Ng , MD Date of Birth: 03/06/1950 CSN: [id number] Age: 66 Admit Type: Inpatient Procedure:                Upper GI endoscopy Indications:              Epigastric abdominal pain, Suspected gastric outlet                            obstruction, Nausea with vomiting Providers:                Lear Ng, MD, Kingsley Plan, RN,                            William Dalton, Technician Referring MD:              Medicines:                Propofol per Anesthesia, Monitored Anesthesia Care Complications:            No immediate complications. Estimated Blood Loss:     Estimated blood loss was minimal. Procedure:                Pre-Anesthesia Assessment:                           - Prior to the procedure, a History and Physical                            was performed, and patient medications and                            allergies were reviewed. The patient's tolerance of                            previous anesthesia was also reviewed. The risks                            and benefits of the procedure and the sedation                            options and risks were discussed with the patient.                            All questions were answered, and informed consent                            was obtained. Prior Anticoagulants: The patient has                            taken no previous anticoagulant or antiplatelet                            agents. ASA Grade Assessment: III - A patient with  severe systemic disease. After reviewing the risks                            and benefits, the patient was deemed in                            satisfactory condition to undergo the procedure.                           After obtaining informed consent, the endoscope was                            passed under direct vision. Throughout  the                            procedure, the patient's blood pressure, pulse, and                            oxygen saturations were monitored continuously. The                            EG-[id number] ([id number]) scope was introduced through the                            mouth, and advanced to the second part of duodenum.                            The upper GI endoscopy was performed with                            difficulty due to a partially obstructing mass. The                            patient tolerated the procedure fairly well. Scope In: Scope Out: Findings:      The upper third of the esophagus and middle third of the esophagus were       normal.      Distal esophagus appearas to have extrinsic compression preventing       visualization of the GEJ.      A large, infiltrative, circumferential mass with no bleeding and no       stigmata of recent bleeding was found in the gastric body.      Mass effect from stomach prevents visualization of the antrum of the       stomach and looping due to the mass prevents passage of the endoscope to       the antrum or pyloric channel. Unable to visualize the pyloric channel       due to the mass that covers most of the stomach.      An examination of the duodenum was not performed due to inability to       reach the distal stomach from the large gastric mass. Impression:               - Normal upper third of esophagus and middle third  of esophagus.                           - Likely malignant gastric tumor in the gastric                            body.                           - No specimens collected. Moderate Sedation:      N/A - MAC procedure Recommendation:           - NPO.                           - Observe patient's clinical course.                           - Await pathology results.                           - Surgeon informed of findings.                           - Post procedure medication orders  were given. Procedure Code(s):        --- Professional ---                           [phone], Esophagogastroduodenoscopy, flexible,                            transoral; diagnostic, including collection of                            specimen(s) by brushing or washing, when performed                            (separate procedure) Diagnosis Code(s):        --- Professional ---                           R10.13, Epigastric pain                           D49.0, Neoplasm of unspecified behavior of                            digestive system                           R11.2, Nausea with vomiting, unspecified CPT copyright 2016 [employer]. All rights reserved. The codes documented in this report are preliminary and upon coder review may  be revised to meet current compliance requirements. Lear Ng, MD 12/18/2016 9:13:47 AM This report has been signed electronically. Number of Addenda: 0

## 2016-12-18 NOTE — Transfer of Care (Signed)
Immediate Anesthesia Transfer of Care Note  Patient: Nicole [name]  Procedure(s) Performed: ESOPHAGOGASTRODUODENOSCOPY (EGD) WITH PROPOFOL (N/A )  Patient Location: PACU  Anesthesia Type:MAC  Level of Consciousness: awake, alert , oriented and patient cooperative  Airway & Oxygen Therapy: Patient Spontanous Breathing and Patient connected to nasal cannula oxygen  Post-op Assessment: Report given to RN, Post -op Vital signs reviewed and stable and Patient moving all extremities  Post vital signs: Reviewed and stable  Last Vitals:  Vitals:   12/18/16 0700 12/18/16 0747  BP: 125/72 125/69  Pulse: 92   Resp: 19 (!) 25  Temp: 37 C 36.8 C  SpO2: 96% 97%    Last Pain:  Vitals:   12/18/16 0747  TempSrc: Oral  PainSc:       Patients Stated Pain Goal: 3 (16/10/96 0454)  Complications: No apparent anesthesia complications

## 2016-12-18 NOTE — Anesthesia Preprocedure Evaluation (Signed)
Anesthesia Evaluation  Patient identified by MRN, date of birth, ID band Patient awake    Reviewed: Allergy & Precautions, NPO status , Patient's Chart, lab work & pertinent test results  History of Anesthesia Complications Negative for: history of anesthetic complications  Airway Mallampati: II  TM Distance: >3 FB Neck ROM: Full    Dental  (+) Poor Dentition, Loose, Missing,    Pulmonary neg shortness of breath, sleep apnea , neg COPD, neg recent URI,    breath sounds clear to auscultation       Cardiovascular hypertension, Pt. on medications (-) angina(-) Past MI and (-) CHF  Rhythm:Regular     Neuro/Psych  Neuromuscular disease    GI/Hepatic Neg liver ROS, hiatal hernia,   Endo/Other  negative endocrine ROS  Renal/GU negative Renal ROS     Musculoskeletal  (+) Arthritis ,   Abdominal   Peds  Hematology  (+) anemia ,   Anesthesia Other Findings   Reproductive/Obstetrics                             Anesthesia Physical Anesthesia Plan  ASA: III  Anesthesia Plan: MAC   Post-op Pain Management:    Induction: Intravenous  PONV Risk Score and Plan: 2 and Treatment may vary due to age or medical condition  Airway Management Planned: Nasal Cannula  Additional Equipment: None  Intra-op Plan:   Post-operative Plan:   Informed Consent: I have reviewed the patients History and Physical, chart, labs and discussed the procedure including the risks, benefits and alternatives for the proposed anesthesia with the patient or authorized representative who has indicated his/her understanding and acceptance.   Dental advisory given  Plan Discussed with: CRNA and Surgeon  Anesthesia Plan Comments:         Anesthesia Quick Evaluation

## 2016-12-18 NOTE — Progress Notes (Signed)
Pt A&Ox4. Pt spoke to Physician about treatment course.  IV team on unit to insert PICC line for TPN.

## 2016-12-18 NOTE — Progress Notes (Signed)
Spoke with Rn re PICC line.  RN to obtain consent from next of kin due to sedation this am.  RN to place IVT consult in once consent obtained.

## 2016-12-18 NOTE — Anesthesia Postprocedure Evaluation (Signed)
Anesthesia Post Note  Patient: Nicole [name]  Procedure(s) Performed: ESOPHAGOGASTRODUODENOSCOPY (EGD) WITH PROPOFOL (N/A )     Patient location during evaluation: Endoscopy Anesthesia Type: MAC Level of consciousness: awake and alert Pain management: pain level controlled Vital Signs Assessment: post-procedure vital signs reviewed and stable Respiratory status: spontaneous breathing, nonlabored ventilation, respiratory function stable and patient connected to nasal cannula oxygen Cardiovascular status: stable and blood pressure returned to baseline Postop Assessment: no apparent nausea or vomiting Anesthetic complications: no    Last Vitals:  Vitals:   12/18/16 2000 12/18/16 2020  BP:  (!) 163/98  Pulse: 92 (!) 108  Resp: (!) 21 (!) 32  Temp:    SpO2: 95% 99%    Last Pain:  Vitals:   12/18/16 1948  TempSrc: Oral  PainSc: 3                  [name]

## 2016-12-18 NOTE — Progress Notes (Addendum)
PHARMACY - ADULT TOTAL PARENTERAL NUTRITION CONSULT NOTE   Pharmacy Consult:  TPN Indication:  Prolonged NPO status  Patient Measurements: Height: 5\' 8"  (172.7 cm) Weight: 164 lb (74.4 kg) IBW/kg (Calculated) : 63.9 TPN AdjBW (KG): 74.4 Body mass index is 24.94 kg/m.  Assessment:  35 YOF presented on 12/16/16 with N/V and essentially no PO intake x4 days.  CT shows gastric mass and EGD concerning for malignant gastric tumor.  Per Surgery, if GIST then consider Gleevec to shrink tumor and will need J-tube for EN; if adenocarcinoma then will need additional work-up.  While awaiting biopsy result to determine care, Pharmacy consulted to initiate TPN for nutritional support.  Noted patient has a history of EtOH use.  GI: hx GERD, presented with hematemesis on Protonix gtt, then bolus Endo: no hx DM - CBGs 80-90s prior to TPN Insulin requirements in the past 24 hours: N/A Lytes: K+ 3, Phos 2.3, others WNL Renal: new renal mass -SCr 0.65, BUN WNL - NS 20K at 125 ml/hr Pulm: stable on RA Cards: HTN - Afib with RVR on admit, no AC d/t hematemesis - VSS, diltiazem gtt, HCTZ, lisinopril Hepatobil: N/A Neuro: EtOH - thiamine, folate, multivitamin  ID: afebrile, WBC WNL - not on abx Best Practices: SCDs TPN Access: PICC placed 12/18/16 TPN start date: 12/18/16  Nutritional Goals (RD rec pending): 1850-2000 kCal and 95-110 gm of protein per day  Current Nutrition:  NPO   Plan:  Initiate TPN at 35 ml/hr (goal ~70 ml/hr), providing 50g AA, 151 g CHO and 21 g of lipids, which adds up to 930 kCal. Lytes in TPN: increase phos slightly from standard formula Daily multivitamin and trace elements in TPN Add thiamine 100mg  and folic acid 1mg  to TPN (D/C scheduled thiamine, multivitamin, folate) Start sensitive SSI Q6H.  D/C if CBGs remain controlled at goal TPN rate. Reduce NS 20K to 90 ml/hr when TPN starts F/U iron supplementation when possible TPN nursing care orders and labs   Nicole Stineman D.  Mina [name], PharmD, BCPS Pager:  [phone] 12/18/2016, 1:02 PM

## 2016-12-18 NOTE — Interval H&P Note (Signed)
History and Physical Interval Note:  12/18/2016 8:00 AM  Nicole [name]  has presented today for surgery, with the diagnosis of abdominal pain,gastric mass  The various methods of treatment have been discussed with the patient and family. After consideration of risks, benefits and other options for treatment, the patient has consented to  Procedure(s): ESOPHAGOGASTRODUODENOSCOPY (EGD) WITH PROPOFOL (N/A) as a surgical intervention .  The patient's history has been reviewed, patient examined, no change in status, stable for surgery.  I have reviewed the patient's chart and labs.  Questions were answered to the patient's satisfaction.     [place]oweta C.

## 2016-12-18 NOTE — Progress Notes (Signed)
PROGRESS NOTE [facility]   Jerre Diguglielmo   [id number] DOB: Jan 21, 1951  DOA: 12/16/2016 PCP: Patient, No Pcp Per   Brief Narrative:  Nicole [name] is a 66 year old female with medical history significant for arthritis, HTN, GERDs and alcohol abuse, presented to the emergency department with 1 week of vomiting, hematemesis and abdominal pain.  She has not being able to keep any liquids or food for about 1week.  In the emergency department she was found to have a gastric mass, A. fib with RVR.  Patient was admitted with working diagnosis of gastric mass, renal mass, A. fib with RVR and anemia.  Subjective: Patients seen and examination,s/p EGD, unable to pass scope to duodenum due mass size, biopsy taken. Patient feeling ok, no nausea vomiting or diarrhea. No acute events overnight. Denies chest pain and SOB. HR well controlled.   Assessment & Plan: Gastric Mass  GI consultation appreciated Underwent EGD -unable to advance scope passed to the duodenum due to mass, biopsies were taken. Recommending keeping patient n.p.o. Case discussed with surgical team, recommend to await for biopsy and treatment depending on pathology For nutrition will place a PICC line and start TPN Zofran/Phenergan for nausea and vomiting Pain medication as needed  Afib w/ RVR - new diagnosis  Unclear etiology of A. fib at this moment, ?  Stress related  Patient was started on Cardizem, she converted to sinus rhythm Since patient continue n.p.o. we will continue IV diltiazem for now. Keep patient in stepdown, and monitor telemetry  Renal Mass  Patient need further evaluation upon this mass, renal MRI is recommended once gastric issues and A. fib remains stable.  Nonemergent workup at this point  Hypokalemia  Replete Check BMP and mag in a.m.  Anemia  Iron deficiency, likely from mass  FOBT positive  Monitor Hgb  Transfuse if Hgb < 7  Diverticulosis of sigmoid colon - new diagnosis  No  diverticulitis, patient is NPO no indication for abx at this time   Alcohol abuse  Has not drink for over a week now, no Hx of DT's or alcohol withdrawal  She out of window no for withdraws - no need for CIWA protocol  Alcohol cessation discussed   DVT prophylaxis: SCD's  Code Status: Full  Family Communication: None at bedside Disposition Plan: TBD   Consultants:   GI  General Surgery   Procedures:   EGD   Antimicrobials: Anti-infectives (From admission, onward)   Start     Dose/Rate Route Frequency Ordered Stop   12/17/16 0515  piperacillin-tazobactam (ZOSYN) IVPB 3.375 g     3.375 g 100 mL/hr over 30 Minutes Intravenous  Once 12/17/16 0503 12/17/16 0636        Objective: Vitals:   12/18/16 0658 12/18/16 0700 12/18/16 0747 12/18/16 0840  BP:  125/72 125/69 102/78  Pulse: 92 92    Resp: (!) 21 19 (!) 25 (!) 24  Temp:  98.6 F (37 C) 98.3 F (36.8 C) 98.6 F (37 C)  TempSrc:  Oral Oral Oral  SpO2: 97% 96% 97% 100%  Weight:   74.4 kg (164 lb)   Height:   5\' 8"  (1.727 m)     Intake/Output Summary (Last 24 hours) at 12/18/2016 0908 Last data filed at 12/18/2016 0600 Gross per 24 hour  Intake 2133.84 ml  Output 1050 ml  Net 1083.84 ml   Filed Weights   12/17/16 1805 12/18/16 0349 12/18/16 0747  Weight: 74.6 kg (164 lb 7.4 oz) 74.6 kg (164 lb  7.4 oz) 74.4 kg (164 lb)    Examination:  General exam: Appears calm and comfortable  HEENT: OP moist and clear Respiratory system: Clear to auscultation. No wheezes,crackle or rhonchi Cardiovascular system: Irr Irr S1S2 no murmurs  Gastrointestinal system: Soft, epigastric tenderness, no mass felt, +BS  Central nervous system: Alert and oriented.  Extremities: No pedal edema. Symmetric Skin: No rashes Psychiatry: Mood & affect appropriate.    Data Reviewed: I have personally reviewed following labs and imaging studies  CBC: Recent Labs  Lab 12/16/16 1658 12/18/16 0527  WBC 12.7* 8.9  HGB 10.2* 9.0*  HCT  31.8* 29.5*  MCV 85.0 87.0  PLT 427* 664   Basic Metabolic Panel: Recent Labs  Lab 12/16/16 1658  NA 131*  K 3.2*  CL 95*  CO2 24  GLUCOSE 127*  BUN 18  CREATININE 0.74  CALCIUM 8.2*   GFR: Estimated Creatinine Clearance: 68.8 mL/min (by C-G formula based on SCr of 0.74 mg/dL). Liver Function Tests: Recent Labs  Lab 12/16/16 1658  AST 16  ALT 13*  ALKPHOS 94  BILITOT 1.0  PROT 6.0*  ALBUMIN 2.4*   Recent Labs  Lab 12/16/16 1658  LIPASE 129*   No results for input(s): AMMONIA in the last 168 hours. Coagulation Profile: No results for input(s): INR, PROTIME in the last 168 hours. Cardiac Enzymes: Recent Labs  Lab 12/17/16 0854  TROPONINI <0.03   BNP (last 3 results) No results for input(s): PROBNP in the last 8760 hours. HbA1C: No results for input(s): HGBA1C in the last 72 hours. CBG: Recent Labs  Lab 12/17/16 2012  GLUCAP 98   Lipid Profile: No results for input(s): CHOL, HDL, LDLCALC, TRIG, CHOLHDL, LDLDIRECT in the last 72 hours. Thyroid Function Tests: Recent Labs    12/17/16 0854  TSH 1.420   Anemia Panel: Recent Labs    12/17/16 0842  FERRITIN 199  TIBC 189*  IRON 13*   Sepsis Labs: No results for input(s): PROCALCITON, LATICACIDVEN in the last 168 hours.  Recent Results (from the past 240 hour(s))  MRSA PCR Screening     Status: None   Collection Time: 12/17/16  9:01 PM  Result Value Ref Range Status   MRSA by PCR NEGATIVE NEGATIVE Final    Comment:        The GeneXpert MRSA Assay (FDA approved for NASAL specimens only), is one component of a comprehensive MRSA colonization surveillance program. It is not intended to diagnose MRSA infection nor to guide or monitor treatment for MRSA infections.       Radiology Studies: Ct Abdomen Pelvis Wo Contrast  Result Date: 12/17/2016 CLINICAL DATA:  Abdominal pain.  Nausea and vomiting for 4 days. EXAM: CT ABDOMEN AND PELVIS WITHOUT CONTRAST TECHNIQUE: Multidetector CT imaging  of the abdomen and pelvis was performed following the standard protocol without IV contrast. COMPARISON:  None. FINDINGS: Lower chest: Small left and trace right pleural effusion. Large hiatal hernia is only partially included in the field of view. Hepatobiliary: Low-density structure either within or abutting the left lobe of the liver is incontinuity with an ill-defined left upper quadrant fluid collection. No suspicious lesion on noncontrast exam. Gallbladder physiologically distended, no calcified stone. No biliary dilatation. Pancreas: Pancreas is poorly defined in the absence of IV contrast. Fluid within or extraluminal to the stomach abuts the region of the proximal pancreas. Spleen: Normal in size without focal abnormality. Adrenals/Urinary Tract: Left adrenal thickening. Normal right adrenal gland. There is a 6.2 cm low-density mass in  the right kidney with central calcifications no hydronephrosis. No left hydronephrosis or focal renal lesion on noncontrast exam. Urinary bladder is in a small left upper quadrant irregular fluid collection. Stomach/bowel: Large hiatal hernia, majority of the stomach is intrathoracic. Administered enteric contrast outlines a large 12 cm homogeneous low-density gastric mass. This extends to the diaphragmatic hiatus. Adjacent to the hiatus in the upper abdomen is fluid that may be within a gastric diverticulum or fluid collection image 26 series 3. There is a separate 6.2 cm fluid collection abutting the distal stomach image 33 series 3. Associated soft tissue stranding in the right abdomen and left upper quadrant. Ill-defined left upper quadrant fluid anteriorly extends to the left lobe of the liver. No air within any of these fluid collections. There is no extraluminal enteric contrast. Despite the extraluminal fluid collections there is no extraluminal air. Enteric contrast opacifies normal-appearing duodenum and small bowel without obstruction or wall thickening. Normal  appendix tentatively identified. No colonic wall thickening or inflammation, diverticulosis noted from the splenic flexure distally. No diverticulitis. Vascular/Lymphatic: Dense aortic atherosclerosis. No aneurysm. Vascular structures particularly in the upper abdomen are not well-defined. Limited assessment for upper abdominal adenopathy. Reproductive: Uterus and bilateral adnexa are unremarkable. Other: Perigastric fluid collections in the upper abdomen as described. Upper abdominal soft tissue stranding. Small amount of free fluid in the pelvis. No evidence of free air. Musculoskeletal: There are no acute or suspicious osseous abnormalities. Degenerative change in the spine and both hips. IMPRESSION: 1. Inflammatory process in the upper abdomen, appears centered on the stomach. There is a large hiatal hernia, which contains a large gastric filling defect, which may be a gastric mass, bezoar or less likely hematoma. This is in possible continuity with gastric diverticulum or perigastric fluid collection in the upper abdomen. There are separate perigastric fluid collections in the upper abdomen, with adjacent soft tissue stranding and inflammation. Despite the peri-gastric fluid collections there is no free air or extraluminal contrast. Underlying etiology is uncertain, however considerations include penetrating gastric mass with associated abscesses. Peptic ulcer disease with intragastric hematoma and contained perforations are also considered. Alternative sources of left upper quadrant fluid collection such as pancreatitis with pseudocyst are felt less likely. Recommend GI consultation, possible endoscopic evaluation. 2. Heterogeneous 6.2 cm right renal mass, complex cyst versus solid lesion. Recommend nonemergent characterization with renal protocol MRI after resolution of acute event. These results were called by telephone at the time of interpretation on 12/17/2016 at 3:34 am to Dr. Joseph Berkshire , who  verbally acknowledged these results. Electronically Signed   By: Jeb Levering M.D.   On: 12/17/2016 03:42   Dg Chest Port 1 View  Addendum Date: 12/17/2016   ADDENDUM REPORT: 12/17/2016 12:37 ADDENDUM: The findings were discussed with the patient's physician, Randa Spike. Electronically Signed   By: Dorise Bullion III M.D   On: 12/17/2016 12:37   Result Date: 12/17/2016 CLINICAL DATA:  Hematemesis. EXAM: PORTABLE CHEST 1 VIEW COMPARISON:  CT of the abdomen and pelvis from earlier today FINDINGS: A left retrocardiac masslike opacity correlates with the large hiatal hernia and gastric filling defect seen on the CT scan from earlier today. A small effusion with underlying atelectasis in the left base was also seen on recent CT imaging. No convincing evidence of acute pneumothorax. Oval lucency projected over the lateral left chest may be artifactual as there is no other evidence of pneumothorax. A small loculated chronic pneumothorax is not completely excluded given multiple old rib fractures on  the left. The lungs are otherwise clear. The heart is not well assessed due to the overlying retrocardiac opacity. The hila and mediastinum are unremarkable. IMPRESSION: 1. The known left retrocardiac mass was better appreciated and characterized on recent CT imaging and is again identified. A small left effusion and underlying atelectasis remains. 2. A small oval lucency projected over the lateral left chest may be artifactual. A small chronic loculated pneumothorax is possible given the multiple adjacent healed rib fractures. Recommend clinical correlation. If the patient has acute chest pain, chest CT could be performed. Findings being called to ER doctor Electronically Signed: By: Dorise Bullion III M.D On: 12/17/2016 08:57   Dg Shoulder Left Portable  Result Date: 12/17/2016 CLINICAL DATA:  66 year old female with recurrent left shoulder pain. Fall 1 year ago. EXAM: LEFT SHOULDER - 1 VIEW COMPARISON:   Chest radiographs and CT Abdomen and Pelvis today reported separately. FINDINGS: Portable AP and scapular Y-view of the left shoulder. No glenohumeral joint dislocation. Visible left humerus intact. Left clavicle and scapula appear intact. Veiling opacity at the left lung base. There are displaced fractures of the lateral left fifth through seventh ribs but these were partially visible and appeared chronic on the CT today. IMPRESSION: 1.  No acute osseous abnormality identified.  At the left shoulder. 2. Abnormal left lung base, see CT Abdomen and Pelvis report today. 3. Chronic appearing left lateral rib fractures. Electronically Signed   By: Genevie Ann M.D.   On: 12/17/2016 09:39     Scheduled Meds: . folic acid  1 mg Oral Daily  . lisinopril  20 mg Oral Daily   And  . hydrochlorothiazide  25 mg Oral Daily  . [START ON 12/20/2016] Influenza vac split quadrivalent PF  0.5 mL Intramuscular Tomorrow-1000  . multivitamin with minerals  1 tablet Oral Daily  . [START ON 12/20/2016] pantoprazole  40 mg Intravenous Q12H  . thiamine  100 mg Oral Daily   Or  . thiamine  100 mg Intravenous Daily   Continuous Infusions: . 0.9 % NaCl with KCl 20 mEq / L 1,000 mL (12/18/16 0901)  . diltiazem (CARDIZEM) infusion 5 mg/hr (12/18/16 0553)  . pantoprozole (PROTONIX) infusion 8 mg/hr (12/18/16 0552)     LOS: 1 day    Time spent: Total of 35 minutes spent with pt, greater than 50% of which was spent in discussion of  treatment, counseling and coordination of care    Chipper Oman, MD Pager: Text Page via [email or web address]   If 7PM-7AM, please contact night-coverage [email or web address] 12/18/2016, 9:08 AM

## 2016-12-19 ENCOUNTER — Inpatient Hospital Stay (HOSPITAL_COMMUNITY): Payer: Medicare Other

## 2016-12-19 ENCOUNTER — Encounter (HOSPITAL_COMMUNITY): Payer: Self-pay | Admitting: Gastroenterology

## 2016-12-19 DIAGNOSIS — I361 Nonrheumatic tricuspid (valve) insufficiency: Secondary | ICD-10-CM

## 2016-12-19 LAB — ECHOCARDIOGRAM COMPLETE
AVLVOTPG: 7 mmHg
CHL CUP DOP CALC LVOT VTI: 22.6 cm
EWDT: 190 ms
FS: 23 % — AB (ref 28–44)
Height: 68 in
IV/PV OW: 1.13
LA ID, A-P, ES: 29 mm
LA diam index: 1.41 cm/m2
LA vol A4C: 80.9 ml
LA vol index: 34.9 mL/m2
LAVOL: 71.8 mL
LDCA: 3.46 cm2
LEFT ATRIUM END SYS DIAM: 29 mm
LV PW d: 8 mm — AB (ref 0.6–1.1)
LV TDI E'MEDIAL: 7.83
LV e' LATERAL: 9.25 cm/s
LVOT SV: 78 mL
LVOT diameter: 21 mm
LVOTPV: 131 cm/s
MV Dec: 190
MV pk E vel: 0.8 m/s
RV LATERAL S' VELOCITY: 20.1 cm/s
RV TAPSE: 27.7 mm
RV sys press: 31 mmHg
Reg peak vel: 266 cm/s
TDI e' lateral: 9.25
TR max vel: 266 cm/s
Weight: 3241.64 oz

## 2016-12-19 LAB — COMPREHENSIVE METABOLIC PANEL
ALBUMIN: 2.1 g/dL — AB (ref 3.5–5.0)
ALBUMIN: 2.2 g/dL — AB (ref 3.5–5.0)
ALK PHOS: 86 U/L (ref 38–126)
ALT: 12 U/L — ABNORMAL LOW (ref 14–54)
ALT: 12 U/L — ABNORMAL LOW (ref 14–54)
ANION GAP: 8 (ref 5–15)
AST: 14 U/L — AB (ref 15–41)
AST: 14 U/L — AB (ref 15–41)
Alkaline Phosphatase: 87 U/L (ref 38–126)
Anion gap: 9 (ref 5–15)
BUN: 10 mg/dL (ref 6–20)
BUN: 9 mg/dL (ref 6–20)
CALCIUM: 7.9 mg/dL — AB (ref 8.9–10.3)
CHLORIDE: 105 mmol/L (ref 101–111)
CO2: 21 mmol/L — AB (ref 22–32)
CO2: 22 mmol/L (ref 22–32)
Calcium: 7.7 mg/dL — ABNORMAL LOW (ref 8.9–10.3)
Chloride: 106 mmol/L (ref 101–111)
Creatinine, Ser: 0.58 mg/dL (ref 0.44–1.00)
Creatinine, Ser: 0.51 mg/dL (ref 0.44–1.00)
GFR calc Af Amer: >60 mL/min (ref >60)
GFR calc non Af Amer: >60 mL/min (ref >60)
Glucose, Bld: 136 mg/dL — ABNORMAL HIGH (ref 65–99)
Glucose, Bld: 155 mg/dL — ABNORMAL HIGH (ref 65–99)
POTASSIUM: 3.7 mmol/L (ref 3.5–5.1)
Potassium: 3.7 mmol/L (ref 3.5–5.1)
SODIUM: 135 mmol/L (ref 135–145)
Sodium: 136 mmol/L (ref 135–145)
TOTAL PROTEIN: 5.9 g/dL — AB (ref 6.5–8.1)
Total Bilirubin: 0.7 mg/dL (ref 0.3–1.2)
Total Bilirubin: 0.6 mg/dL (ref 0.3–1.2)
Total Protein: 5.3 g/dL — ABNORMAL LOW (ref 6.5–8.1)

## 2016-12-19 LAB — DIFFERENTIAL
BASOS PCT: 1 %
Basophils Absolute: 0 10*3/uL (ref 0.0–0.1)
Eosinophils Absolute: 0.4 10*3/uL (ref 0.0–0.7)
Eosinophils Relative: 5 %
Lymphocytes Relative: 6 %
Lymphs Abs: 0.5 10*3/uL — ABNORMAL LOW (ref 0.7–4.0)
MONO ABS: 0.3 10*3/uL (ref 0.1–1.0)
MONOS PCT: 4 %
Neutro Abs: 6.5 10*3/uL (ref 1.7–7.7)
Neutrophils Relative %: 84 %

## 2016-12-19 LAB — CBC
HEMATOCRIT: 30.3 % — AB (ref 36.0–46.0)
Hemoglobin: 9.3 g/dL — ABNORMAL LOW (ref 12.0–15.0)
MCH: 26.6 pg (ref 26.0–34.0)
MCHC: 30.7 g/dL (ref 30.0–36.0)
MCV: 86.8 fL (ref 78.0–100.0)
Platelets: 365 10*3/uL (ref 150–400)
RBC: 3.49 MIL/uL — ABNORMAL LOW (ref 3.87–5.11)
RDW: 15.4 % (ref 11.5–15.5)
WBC: 7.8 10*3/uL (ref 4.0–10.5)

## 2016-12-19 LAB — GLUCOSE, CAPILLARY
GLUCOSE-CAPILLARY: 180 mg/dL — AB (ref 65–99)
Glucose-Capillary: 130 mg/dL — ABNORMAL HIGH (ref 65–99)
Glucose-Capillary: 158 mg/dL — ABNORMAL HIGH (ref 65–99)
Glucose-Capillary: 169 mg/dL — ABNORMAL HIGH (ref 65–99)

## 2016-12-19 LAB — PREALBUMIN: Prealbumin: 6.1 mg/dL — ABNORMAL LOW (ref 18–38)

## 2016-12-19 LAB — PHOSPHORUS: Phosphorus: 1.9 mg/dL — ABNORMAL LOW (ref 2.5–4.6)

## 2016-12-19 LAB — TRIGLYCERIDES: Triglycerides: 129 mg/dL (ref <150)

## 2016-12-19 LAB — MAGNESIUM: Magnesium: 1.7 mg/dL (ref 1.7–2.4)

## 2016-12-19 MED ORDER — PROMETHAZINE HCL 25 MG/ML IJ SOLN
12.5000 mg | Freq: Once | INTRAMUSCULAR | Status: AC
  Administered 2016-12-20: 12.5 mg via INTRAVENOUS
  Filled 2016-12-19: qty 1

## 2016-12-19 MED ORDER — POTASSIUM PHOSPHATES 15 MMOLE/5ML IV SOLN
20.0000 mmol | Freq: Once | INTRAVENOUS | Status: AC
  Administered 2016-12-19: 20 mmol via INTRAVENOUS
  Filled 2016-12-19: qty 6.67

## 2016-12-19 MED ORDER — ONDANSETRON HCL 4 MG PO TABS: 4.0000 mg | ORAL_TABLET | Freq: Three times a day (TID) | ORAL | Status: DC

## 2016-12-19 MED ORDER — ONDANSETRON HCL 4 MG/2ML IJ SOLN
INTRAMUSCULAR | Status: AC
  Filled 2016-12-19: qty 2

## 2016-12-19 MED ORDER — ONDANSETRON HCL 4 MG/2ML IJ SOLN: 4.0000 mg | Freq: Three times a day (TID) | INTRAMUSCULAR | Status: DC

## 2016-12-19 MED ORDER — TRAVASOL 10 % IV SOLN
INTRAVENOUS | Status: AC
  Administered 2016-12-19: 18:00:00 via INTRAVENOUS
  Filled 2016-12-19: qty 708

## 2016-12-19 MED ORDER — ONDANSETRON HCL 4 MG/2ML IJ SOLN
4.0000 mg | Freq: Three times a day (TID) | INTRAMUSCULAR | Status: DC
  Administered 2016-12-19 – 2016-12-20 (×2): 4 mg via INTRAVENOUS
  Filled 2016-12-19 (×3): qty 2

## 2016-12-19 MED ORDER — INSULIN ASPART 100 UNIT/ML ~~LOC~~ SOLN
0.0000 [IU] | Freq: Four times a day (QID) | SUBCUTANEOUS | Status: DC
  Administered 2016-12-20: 3 [IU] via SUBCUTANEOUS
  Administered 2016-12-20: 2 [IU] via SUBCUTANEOUS
  Administered 2016-12-20 (×2): 3 [IU] via SUBCUTANEOUS
  Administered 2016-12-21: 2 [IU] via SUBCUTANEOUS
  Administered 2016-12-21 (×2): 3 [IU] via SUBCUTANEOUS
  Administered 2016-12-21: 2 [IU] via SUBCUTANEOUS
  Administered 2016-12-22 – 2016-12-23 (×7): 3 [IU] via SUBCUTANEOUS
  Administered 2016-12-23: 5 [IU] via SUBCUTANEOUS
  Administered 2016-12-24: 11 [IU] via SUBCUTANEOUS
  Administered 2016-12-24: 8 [IU] via SUBCUTANEOUS
  Administered 2016-12-24: 11 [IU] via SUBCUTANEOUS

## 2016-12-19 MED ORDER — POTASSIUM CHLORIDE IN NACL 40-0.9 MEQ/L-% IV SOLN
INTRAVENOUS | Status: AC
  Administered 2016-12-20: 55 mL/h via INTRAVENOUS
  Filled 2016-12-19: qty 1000

## 2016-12-19 MED ORDER — INSULIN ASPART 100 UNIT/ML ~~LOC~~ SOLN
0.0000 [IU] | SUBCUTANEOUS | Status: DC
  Administered 2016-12-19: 3 [IU] via SUBCUTANEOUS
  Administered 2016-12-19: 2 [IU] via SUBCUTANEOUS

## 2016-12-19 MED ORDER — FAMOTIDINE IN NACL 20-0.9 MG/50ML-% IV SOLN
20.0000 mg | Freq: Two times a day (BID) | INTRAVENOUS | Status: DC
  Administered 2016-12-19 – 2016-12-24 (×11): 20 mg via INTRAVENOUS
  Filled 2016-12-19 (×11): qty 50

## 2016-12-19 NOTE — Progress Notes (Signed)
PROGRESS NOTE [facility]   Nicole [name]   [id number] DOB: [birth year]  DOA: 12/16/2016 PCP: Patient, No Pcp Per   Brief Narrative:  Nicole [name] is a 66 year old female with medical history significant for arthritis, HTN, GERDs and alcohol abuse, presented to the emergency department with 1 week of vomiting, hematemesis and abdominal pain.  She has not being able to keep any liquids or food for about 1week.  In the emergency department she was found to have a gastric mass, A. fib with RVR.  Patient was admitted with working diagnosis of gastric mass, renal mass, A. fib with RVR and anemia.  Subjective: Patient see and examined with son at bedside. Patient report having nausea and vomiting. Pain controlled ok. No other concern. Started on TPN yesterday   Assessment & Plan: Gastric Mass  GI consultation appreciated -  Awaiting biopsy results  Underwent EGD -unable to advance scope passed to the duodenum due to mass, biopsies were taken. Continue NPO for now  Pending pathology of the biopsy will determine treatment plan - IF adenocarcinoma needs further workup, if GIST could consider shrinking and J tube placement    Continue TPN for now  Will schedule Zofran for now, check daily EKG's for QT prolongation  Continue pain meds PRN   Afib w/ RVR - new diagnosis  Unclear etiology of A. fib at this moment, ?  Stress related  Continue Cardizem for now as she is NPO, she converted to sinus rhythm Keep patient in stepdown, and monitor telemetry  Renal Mass  Patient need further evaluation upon this mass, renal MRI is recommended once gastric issues and A. fib remains stable.  Nonemergent workup at this point  Hypokalemia  - Resolved   Anemia  Iron deficiency, likely from mass  FOBT positive  Hgb stable   Transfuse if Hgb < 7  Diverticulosis of sigmoid colon - new diagnosis  No diverticulitis, patient is NPO no indication for abx at this time   Alcohol abuse  Has not  drink for over a week now, no Hx of DT's or alcohol withdrawal  She out of window no for withdraws - no need for CIWA protocol  Alcohol cessation discussed   DVT prophylaxis: SCD's  Code Status: Full  Family Communication: None at bedside Disposition Plan: TBD   Consultants:   GI  General Surgery   Procedures:   EGD   Antimicrobials: Anti-infectives (From admission, onward)   Start     Dose/Rate Route Frequency Ordered Stop   12/17/16 0515  piperacillin-tazobactam (ZOSYN) IVPB 3.375 g     3.375 g 100 mL/hr over 30 Minutes Intravenous  Once 12/17/16 0503 12/17/16 0636        Objective: Vitals:   12/19/16 0400 12/19/16 0500 12/19/16 0600 12/19/16 0700  BP: 126/75 (!) 145/89  (!) 152/84  Pulse: 95 (!) 101 (!) 106 97  Resp: 18 18 (!) 21 (!) 24  Temp:    98.2 F (36.8 C)  TempSrc:    Oral  SpO2: 95% 96% 99% 96%  Weight:      Height:        Intake/Output Summary (Last 24 hours) at 12/19/2016 0858 Last data filed at 12/19/2016 6578 Gross per 24 hour  Intake 2611.25 ml  Output 1450 ml  Net 1161.25 ml   Filed Weights   12/18/16 0349 12/18/16 0747 12/19/16 0332  Weight: 74.6 kg (164 lb 7.4 oz) 74.4 kg (164 lb) 91.9 kg (202 lb 9.6 oz)    Examination:  General: NAD  Cardiovascular: RRR S1S2 no murmurs  Respiratory: CTA bilaterally, no wheezing, no rhonchi Abdominal: Epigastric tenderness, Soft, ND, + bowel sounds, no masses felt  Extremities: no edema  Data Reviewed: I have personally reviewed following labs and imaging studies  CBC: Recent Labs  Lab 12/16/16 1658 12/18/16 0527 12/19/16 0509  WBC 12.7* 8.9 7.8  NEUTROABS  --   --  6.5  HGB 10.2* 9.0* 9.3*  HCT 31.8* 29.5* 30.3*  MCV 85.0 87.0 86.8  PLT 427* 397 025   Basic Metabolic Panel: Recent Labs  Lab 12/16/16 1658 12/18/16 1124 12/18/16 2328 12/19/16 0509  NA 131* 136 135 136  K 3.2* 3.0* 3.7 3.7  CL 95* 103 106 105  CO2 24 22 21* 22  GLUCOSE 127* 98 136* 155*  BUN 18 11 10 9     CREATININE 0.74 0.65 0.58 0.51  CALCIUM 8.2* 7.7* 7.7* 7.9*  MG  --  1.7  --  1.7  PHOS  --  2.3*  --  1.9*   GFR: Estimated Creatinine Clearance: 80.9 mL/min (by C-G formula based on SCr of 0.51 mg/dL). Liver Function Tests: Recent Labs  Lab 12/16/16 1658 12/18/16 2328 12/19/16 0509  AST 16 14* 14*  ALT 13* 12* 12*  ALKPHOS 94 86 87  BILITOT 1.0 0.7 0.6  PROT 6.0* 5.3* 5.9*  ALBUMIN 2.4* 2.1* 2.2*   Recent Labs  Lab 12/16/16 1658  LIPASE 129*   No results for input(s): AMMONIA in the last 168 hours. Coagulation Profile: No results for input(s): INR, PROTIME in the last 168 hours. Cardiac Enzymes: Recent Labs  Lab 12/17/16 0854  TROPONINI <0.03   BNP (last 3 results) No results for input(s): PROBNP in the last 8760 hours. HbA1C: No results for input(s): HGBA1C in the last 72 hours. CBG: Recent Labs  Lab 12/18/16 1243 12/18/16 1639 12/18/16 1947 12/18/16 2319 12/19/16 0614  GLUCAP 87 85 136* 146* 169*   Lipid Profile: Recent Labs    12/19/16 0509  TRIG 129   Thyroid Function Tests: Recent Labs    12/17/16 0854  TSH 1.420   Anemia Panel: Recent Labs    12/17/16 0842  FERRITIN 199  TIBC 189*  IRON 13*   Sepsis Labs: No results for input(s): PROCALCITON, LATICACIDVEN in the last 168 hours.  Recent Results (from the past 240 hour(s))  MRSA PCR Screening     Status: None   Collection Time: 12/17/16  9:01 PM  Result Value Ref Range Status   MRSA by PCR NEGATIVE NEGATIVE Final    Comment:        The GeneXpert MRSA Assay (FDA approved for NASAL specimens only), is one component of a comprehensive MRSA colonization surveillance program. It is not intended to diagnose MRSA infection nor to guide or monitor treatment for MRSA infections.      Radiology Studies: Dg Shoulder Left Portable  Result Date: 12/17/2016 CLINICAL DATA:  66 year old female with recurrent left shoulder pain. Fall 1 year ago. EXAM: LEFT SHOULDER - 1 VIEW  COMPARISON:  Chest radiographs and CT Abdomen and Pelvis today reported separately. FINDINGS: Portable AP and scapular Y-view of the left shoulder. No glenohumeral joint dislocation. Visible left humerus intact. Left clavicle and scapula appear intact. Veiling opacity at the left lung base. There are displaced fractures of the lateral left fifth through seventh ribs but these were partially visible and appeared chronic on the CT today. IMPRESSION: 1.  No acute osseous abnormality identified.  At the left shoulder.  2. Abnormal left lung base, see CT Abdomen and Pelvis report today. 3. Chronic appearing left lateral rib fractures. Electronically Signed   By: Genevie Ann M.D.   On: 12/17/2016 09:39    Scheduled Meds: . Chlorhexidine Gluconate Cloth  6 each Topical Daily  . lisinopril  20 mg Oral Daily   And  . hydrochlorothiazide  25 mg Oral Daily  . [START ON 12/20/2016] Influenza vac split quadrivalent PF  0.5 mL Intramuscular Tomorrow-1000  . insulin aspart  0-15 Units Subcutaneous Q4H  . mouth rinse  15 mL Mouth Rinse BID  . ondansetron (ZOFRAN) IV  4 mg Intravenous Q8H   Or  . ondansetron  4 mg Oral Q8H  . [START ON 12/20/2016] pantoprazole  40 mg Intravenous Q12H  . sodium chloride flush  10-40 mL Intracatheter Q12H   Continuous Infusions: . 0.9 % NaCl with KCl 40 mEq / L 75 mL/hr (12/19/16 0401)  . 0.9 % NaCl with KCl 40 mEq / L    . diltiazem (CARDIZEM) infusion 5 mg/hr (12/18/16 2256)  . pantoprozole (PROTONIX) infusion 8 mg/hr (12/19/16 0612)  . potassium PHOSPHATE IVPB (mmol)    . TPN ADULT (ION) 35 mL/hr at 12/19/16 0340  . TPN ADULT (ION)       LOS: 2 days    Time spent: Total of 25 minutes spent with pt, greater than 50% of which was spent in discussion of  treatment, counseling and coordination of care  Chipper Oman, MD Pager: Text Page via [email or web address]   If 7PM-7AM, please contact night-coverage [email or web address] 12/19/2016, 8:58 AM

## 2016-12-19 NOTE — Progress Notes (Signed)
PHARMACY - ADULT TOTAL PARENTERAL NUTRITION CONSULT NOTE   Pharmacy Consult:  TPN Indication:  Prolonged NPO status  Patient Measurements: Height: 5\' 8"  (172.7 cm) Weight: 202 lb 9.6 oz (91.9 kg) IBW/kg (Calculated) : 63.9 TPN AdjBW (KG): 74.4 Body mass index is 30.81 kg/m.  Assessment:  89 YOF presented on 12/16/16 with N/V and essentially no PO intake x4 days.  CT shows gastric mass and EGD concerning for malignant gastric tumor.  Per Surgery, if GIST then consider Gleevec to shrink tumor and will need J-tube for EN; if adenocarcinoma then will need additional work-up.  While awaiting biopsy result to determine care, Pharmacy consulted to initiate TPN for nutritional support.  Noted patient has a history of EtOH use.  GI: Hx GERD, presented with hematemesis on Protonix gtt. Albumin low at 2.2. Prealbumin 6.1. Last BM was on 11/2. Endo: No hx DM. CBGs trending up with start of TPN (80-150s) on SSI Insulin requirements in the past 24 hours: 3 units Lytes: wnl exc K up to 3.7, Phos down to 1.9, Mg 1.7 Renal: new renal mass. SCr stable, BUN wnl - NS 20K at 75 ml/hr Pulm: stable on RA Cards: HTN - Afib with RVR on admit, no AC d/t hematemesis - VSS, diltiazem gtt, HCTZ, lisinopril Hepatobil: LFTs ok and Tbili wnl. Trig 129 Neuro: EtOH - thiamine, folate, multivitamin  ID: afebrile, WBC WNL - not on abx Best Practices: SCDs TPN Access: PICC placed 12/18/16 TPN start date: 12/18/16  Nutritional Goals (RD rec pending): 1850-2000 kCal 95-110 gm of protein per day  Current Nutrition:  NPO TPN   Plan:  Increase customized TPN to 29ml/hr (goal ~70 ml/hr)\ This provides 71g AA, 216 g CHO and 32 g of lipids, which adds up to 1,330 kCal. Increase TPN to goal as tolerated Lytes in TPN: increase Phos, increase Mg Daily multivitamin and trace elements in TPN Add thiamine 100mg  and folic acid 1mg  to TPN Increase to moderate SSI Q4h Reduce NS 20K to 55 ml/hr when TPN increases  tonight Monitor TPN labs F/U iron supplementation when possible  Give potassium phosphate 42mmol IV x 1 this morning   Elenor Quinones, PharmD, [facility] Clinical Pharmacist Pager [phone] 12/19/2016 7:45 AM

## 2016-12-19 NOTE — Progress Notes (Signed)
Progress Note  Patient Name: Nicole [name] Date of Encounter: 12/19/2016  Primary Cardiologist: Hilty  Subjective   Mild abd pain. No CP, no SOB  Inpatient Medications    Scheduled Meds: . lisinopril  20 mg Oral Daily   And  . hydrochlorothiazide  25 mg Oral Daily  . [START ON 12/20/2016] Influenza vac split quadrivalent PF  0.5 mL Intramuscular Tomorrow-1000  . insulin aspart  0-15 Units Subcutaneous Q4H  . mouth rinse  15 mL Mouth Rinse BID  . ondansetron (ZOFRAN) IV  4 mg Intravenous Q8H   Or  . ondansetron  4 mg Oral Q8H  . [START ON 12/20/2016] pantoprazole  40 mg Intravenous Q12H  . promethazine  12.5 mg Intravenous Once  . sodium chloride flush  10-40 mL Intracatheter Q12H   Continuous Infusions: . 0.9 % NaCl with KCl 40 mEq / L Stopped (12/19/16 1045)  . 0.9 % NaCl with KCl 40 mEq / L    . diltiazem (CARDIZEM) infusion 5 mg/hr (12/19/16 1116)  . famotidine (PEPCID) IV Stopped (12/19/16 1110)  . pantoprozole (PROTONIX) infusion 8 mg/hr (12/19/16 1113)  . potassium PHOSPHATE IVPB (mmol) 20 mmol (12/19/16 1054)  . TPN ADULT (ION) 35 mL/hr at 12/19/16 0340  . TPN ADULT (ION)     PRN Meds: acetaminophen **OR** acetaminophen, morphine injection, sodium chloride flush   Vital Signs    Vitals:   12/19/16 0700 12/19/16 0800 12/19/16 1237 12/19/16 1540  BP: (!) 152/84 (!) 149/73 (!) 144/58 (!) 146/82  Pulse: 97 96 98 (!) 103  Resp: (!) 24  (!) 22 (!) 21  Temp: 98.2 F (36.8 C)  98.2 F (36.8 C) 98.6 F (37 C)  TempSrc: Oral  Oral Oral  SpO2: 96%  96% 93%  Weight:      Height:        Intake/Output Summary (Last 24 hours) at 12/19/2016 1606 Last data filed at 12/19/2016 1230 Gross per 24 hour  Intake 2246.75 ml  Output 1250 ml  Net 996.75 ml   Filed Weights   12/18/16 0349 12/18/16 0747 12/19/16 0332  Weight: 164 lb 7.4 oz (74.6 kg) 164 lb (74.4 kg) 202 lb 9.6 oz (91.9 kg)    Telemetry    NSR - Personally Reviewed  ECG    No new - Personally  Reviewed  Physical Exam   GEN: No acute distress.   Neck: No JVD Cardiac: RRR, no murmurs, rubs, or gallops.  Respiratory: Clear to auscultation bilaterally. GI: Soft, nontender, non-distended  MS: No edema; No deformity. Neuro:  Nonfocal  Psych: Normal affect   Labs    Chemistry Recent Labs  Lab 12/16/16 1658 12/18/16 1124 12/18/16 2328 12/19/16 0509  NA 131* 136 135 136  K 3.2* 3.0* 3.7 3.7  CL 95* 103 106 105  CO2 24 22 21* 22  GLUCOSE 127* 98 136* 155*  BUN 18 11 10 9   CREATININE 0.74 0.65 0.58 0.51  CALCIUM 8.2* 7.7* 7.7* 7.9*  PROT 6.0*  --  5.3* 5.9*  ALBUMIN 2.4*  --  2.1* 2.2*  AST 16  --  14* 14*  ALT 13*  --  12* 12*  ALKPHOS 94  --  86 87  BILITOT 1.0  --  0.7 0.6  GFRNONAA >60 >60 >60 >60  GFRAA >60 >60 >60 >60  ANIONGAP 12 11 8 9      Hematology Recent Labs  Lab 12/16/16 1658 12/18/16 0527 12/19/16 0509  WBC 12.7* 8.9 7.8  RBC 3.74*  3.39* 3.49*  HGB 10.2* 9.0* 9.3*  HCT 31.8* 29.5* 30.3*  MCV 85.0 87.0 86.8  MCH 27.3 26.5 26.6  MCHC 32.1 30.5 30.7  RDW 15.5 15.6* 15.4  PLT 427* 397 365    Cardiac Enzymes Recent Labs  Lab 12/17/16 0854  TROPONINI <0.03    Recent Labs  Lab 12/17/16 0907  TROPIPOC 0.00     BNP Recent Labs  Lab 12/17/16 0854  BNP 83.7     DDimer No results for input(s): DDIMER in the last 168 hours.   Radiology    No results found.  Cardiac Studies   ECHO -EF 55%, mod LAE  Patient Profile     66 y.o. female with PAF, gastric mass  Assessment & Plan    PAF  - continue IV dilt 5/hr since unable to take PO  - stable currently  Gastric mass  - per GI team  Please let us know if we can be of further assistance. Will sign off.   For questions or updates, please contact [place] Please consult [email or web address] for contact info under Cardiology/STEMI.      Signed, Candee Furbish, MD  12/19/2016, 4:06 PM

## 2016-12-19 NOTE — Plan of Care (Signed)
Patient has had son in room and resting periodically throughout the day

## 2016-12-19 NOTE — Progress Notes (Addendum)
Initial Nutrition Assessment  DOCUMENTATION CODES:   Obesity unspecified  INTERVENTION:    TPN per pharmacy  NUTRITION DIAGNOSIS:   Severe Malnutrition related to catabolic illness as evidenced by energy intake < or equal to 75% for > or equal to 1 month, percent weight loss (9% x 3 months)  GOAL:   Patient will meet greater than or equal to 90% of their needs  MONITOR:   Diet advancement, Labs, Weight trends, Skin, I & O's, Other (Comment)(TPN prescription )  REASON FOR ASSESSMENT:   Consult New TPN/TNA  ASSESSMENT:   66 yo Female with PMH of HTN, arthritis and ETOH abuse; admitted with one week of constant nausea, constant vomiting, severe abdominal pain, and hematemesis.  RD spoke with patient at bedside. Son present. Pt reports a poor appetite x 1 week PTA. Was only consuming bland foods such as Ramen noodles and pasta. Son states pt has lost approximately 20 lbs (9%) in the last 3 months. Severe for time frame.  S/p upper EGD 11/4. Likely malignant gastric tumor in gastric body. Labs and medications reviewed. P 1.9 (L). PAB 6.1 (L). CBG's [phone].  Patient is receiving customized TPN at 50 ml/hr.  Provides 1330 kcal and 71 grams protein per day.  Meets 66% minimum estimated energy needs and 71% minimum estimated protein needs.  NUTRITION - FOCUSED PHYSICAL EXAM:     Most Recent Value  Orbital Region  Unable to assess  Upper Arm Region  Unable to assess  Thoracic and Lumbar Region  Unable to assess  Buccal Region  Unable to assess  Temple Region  Unable to assess  Clavicle Bone Region  Unable to assess  Clavicle and Acromion Bone Region  Unable to assess  Scapular Bone Region  Unable to assess  Dorsal Hand  Unable to assess  Patellar Region  Unable to assess  Anterior Thigh Region  Unable to assess  Posterior Calf Region  Unable to assess  Edema (RD Assessment)  Unable to assess  NFPE deferred; RN entered room to give scheduled medications      Diet Order:  Diet NPO time specified Except for: Ice Chips TPN ADULT (ION) TPN ADULT (ION)  EDUCATION NEEDS:   No education needs have been identified at this time  Skin:  Skin Assessment: Reviewed RN Assessment  Last BM:  11/2  Height:   Ht Readings from Last 1 Encounters:  12/18/16 5\' 8"  (1.727 m)    Weight:   Wt Readings from Last 1 Encounters:  12/19/16 202 lb 9.6 oz (91.9 kg)   Ideal Body Weight:  66 kg  BMI:  Body mass index is 30.81 kg/m.  Estimated Nutritional Needs:   Kcal:  2000-2200  Protein:  100-115 gm  Fluid:  2.0-2.2 L  Arthur Holms, RD, LDN Pager #: [phone] After-Hours Pager #: [phone]

## 2016-12-19 NOTE — Progress Notes (Signed)
Wasted Morphine 3 mg with Estill Bamberg RN

## 2016-12-19 NOTE — Progress Notes (Signed)
  2D Echocardiogram has been performed.  Nicole [name] 12/19/2016, 11:33 AM

## 2016-12-19 NOTE — Progress Notes (Signed)
[facility] Progress Note  1 Day Post-Op  Subjective: CC:  Currently receiving bedside echo. Patient reports upper abdominal pain. Eating ice chips but does "spit up" afterwards. Does not want NG tube. Last BM 2-3 days ago and "normal" - formed, brown, non-bloody. Denies fever or chills.  Objective: Vital signs in last 24 hours: Temp:  [98.1 F (36.7 C)-99.1 F (37.3 C)] 98.2 F (36.8 C) (11/05 0700) Pulse Rate:  [90-113] 96 (11/05 0800) Resp:  [16-32] 24 (11/05 0700) BP: (126-170)/(69-98) 149/73 (11/05 0800) SpO2:  [91 %-100 %] 96 % (11/05 0700) Weight:  [91.9 kg (202 lb 9.6 oz)] 91.9 kg (202 lb 9.6 oz) (11/05 0332) Last BM Date: 12/16/16  Intake/Output from previous day: 11/04 0701 - 11/05 0700 In: 2611.3 [P.O.:180; I.V.:2331.3; IV Piggyback:100] Out: 1450 [Urine:1450] Intake/Output this shift: No intake/output data recorded.  PE: Gen:  Alert, NAD, cooperative  HEENT: pupils equal and round. EOM's in tact, anicteric sclerae  Pulm:  Normal effort, CTAB Abd: Soft, TTP upper abdomen without peritonitis or guarding, +BS, no hernias Skin: warm and dry, no rashes  Psych: A&Ox3   Lab Results:  Recent Labs    12/18/16 0527 12/19/16 0509  WBC 8.9 7.8  HGB 9.0* 9.3*  HCT 29.5* 30.3*  PLT 397 365   BMET Recent Labs    12/18/16 2328 12/19/16 0509  NA 135 136  K 3.7 3.7  CL 106 105  CO2 21* 22  GLUCOSE 136* 155*  BUN 10 9  CREATININE 0.58 0.51  CALCIUM 7.7* 7.9*   PT/INR No results for input(s): LABPROT, INR in the last 72 hours. CMP     Component Value Date/Time   NA 136 12/19/2016 0509   K 3.7 12/19/2016 0509   CL 105 12/19/2016 0509   CO2 22 12/19/2016 0509   GLUCOSE 155 (H) 12/19/2016 0509   BUN 9 12/19/2016 0509   CREATININE 0.51 12/19/2016 0509   CALCIUM 7.9 (L) 12/19/2016 0509   PROT 5.9 (L) 12/19/2016 0509   ALBUMIN 2.2 (L) 12/19/2016 0509   AST 14 (L) 12/19/2016 0509   ALT 12 (L) 12/19/2016 0509   ALKPHOS 87 12/19/2016 0509   BILITOT 0.6 12/19/2016 0509   GFRNONAA >60 12/19/2016 0509   GFRAA >60 12/19/2016 0509   Lipase     Component Value Date/Time   LIPASE 129 (H) 12/16/2016 1658       Studies/Results: No results found.  Anti-infectives: Anti-infectives (From admission, onward)   Start     Dose/Rate Route Frequency Ordered Stop   12/17/16 0515  piperacillin-tazobactam (ZOSYN) IVPB 3.375 g     3.375 g 100 mL/hr over 30 Minutes Intravenous  Once 12/17/16 0503 12/17/16 0636       Assessment/Plan Nausea, vomiting, hematemesis Large hiatal hernia Right renal mass Anemia - hgb 9.3, hct 30.3  afib w/ RVR Protein calorie malnutrition Hypokalemia Alcohol use versus dependence  Large Gastric body mass causing gastric outlet obstruction - s/p endoscopy and BX 11/4 Dr. Michail Sermon, follow path - surgical recommendations to follow pathology results - continue NPO w/ ice chips  FEN: NPO, ice chips, PICC/TNA; NG tube PRN for persistent emesis  ID: none  VTE: SCD's     LOS: 2 days    Jill Alexanders , [facility] 12/19/2016, 10:39 AM Pager: [phone] Consults: [phone] Mon-Fri 7:00 am-4:30 pm Sat-Sun 7:00 am-11:30 am

## 2016-12-20 ENCOUNTER — Inpatient Hospital Stay (HOSPITAL_COMMUNITY): Payer: Medicare Other

## 2016-12-20 DIAGNOSIS — K319 Disease of stomach and duodenum, unspecified: Secondary | ICD-10-CM

## 2016-12-20 DIAGNOSIS — I4891 Unspecified atrial fibrillation: Secondary | ICD-10-CM

## 2016-12-20 LAB — BASIC METABOLIC PANEL
Anion gap: 7 (ref 5–15)
BUN: 10 mg/dL (ref 6–20)
CALCIUM: 7.9 mg/dL — AB (ref 8.9–10.3)
CHLORIDE: 107 mmol/L (ref 101–111)
CO2: 24 mmol/L (ref 22–32)
CREATININE: 0.51 mg/dL (ref 0.44–1.00)
GFR calc Af Amer: >60 mL/min (ref >60)
GFR calc non Af Amer: >60 mL/min (ref >60)
GLUCOSE: 129 mg/dL — AB (ref 65–99)
Potassium: 3.7 mmol/L (ref 3.5–5.1)
Sodium: 138 mmol/L (ref 135–145)

## 2016-12-20 LAB — MAGNESIUM: Magnesium: 1.7 mg/dL (ref 1.7–2.4)

## 2016-12-20 LAB — HEMOGLOBIN A1C
HEMOGLOBIN A1C: 5.9 % — AB (ref 4.8–5.6)
Mean Plasma Glucose: 122.63 mg/dL

## 2016-12-20 LAB — PHOSPHORUS: Phosphorus: 2.9 mg/dL (ref 2.5–4.6)

## 2016-12-20 LAB — GLUCOSE, CAPILLARY
GLUCOSE-CAPILLARY: 141 mg/dL — AB (ref 65–99)
GLUCOSE-CAPILLARY: 140 mg/dL — AB (ref 65–99)
Glucose-Capillary: 155 mg/dL — ABNORMAL HIGH (ref 65–99)
Glucose-Capillary: 151 mg/dL — ABNORMAL HIGH (ref 65–99)

## 2016-12-20 MED ORDER — LORAZEPAM 2 MG/ML IJ SOLN
0.5000 mg | INTRAMUSCULAR | Status: DC | PRN
  Administered 2016-12-20 – 2016-12-22 (×7): 0.5 mg via INTRAVENOUS
  Filled 2016-12-20 (×8): qty 1

## 2016-12-20 MED ORDER — SODIUM CHLORIDE 0.9 % IV SOLN: 8.0000 mg | Freq: Three times a day (TID) | INTRAVENOUS | Status: DC

## 2016-12-20 MED ORDER — SODIUM CHLORIDE 0.9 % IV SOLN
8.0000 mg | Freq: Three times a day (TID) | INTRAVENOUS | Status: DC
  Administered 2016-12-20 – 2016-12-27 (×23): 8 mg via INTRAVENOUS
  Filled 2016-12-20 (×26): qty 4

## 2016-12-20 MED ORDER — TRAVASOL 10 % IV SOLN
INTRAVENOUS | Status: AC
  Administered 2016-12-20: 17:00:00 via INTRAVENOUS
  Filled 2016-12-20: qty 1062

## 2016-12-20 MED ORDER — POTASSIUM CHLORIDE IN NACL 40-0.9 MEQ/L-% IV SOLN
INTRAVENOUS | Status: DC
  Administered 2016-12-20: 10 mL/h via INTRAVENOUS
  Filled 2016-12-20: qty 1000

## 2016-12-20 MED ORDER — ONDANSETRON HCL 4 MG PO TABS
4.0000 mg | ORAL_TABLET | Freq: Three times a day (TID) | ORAL | Status: DC
  Filled 2016-12-20 (×14): qty 1

## 2016-12-20 MED ORDER — ONDANSETRON HCL 4 MG PO TABS: 4.0000 mg | ORAL_TABLET | Freq: Three times a day (TID) | ORAL | Status: DC

## 2016-12-20 NOTE — Progress Notes (Signed)
Patient tolerated sitting up in bedside chair without incident for about 1.5 hrs. From 1145 to 1240 patient became extremely short of breath, inspiratory wheezing, resp rate 31, using all abdominal muscles to breath and comp;laing of pain.  Have not seen patient like this in the past 36 hrs. Called and talk with Dr Quincy Simmonds after briefing him of incident.  Patient was given Ativan and Morphine as prescribed.  After 45 mins patient became more restful moaning occasionally with son at bedside.  Will continue to monitor closely.

## 2016-12-20 NOTE — Progress Notes (Signed)
[facility] Progress Note  2 Days Post-Op  Subjective: CC: Reports upper abdominal pain. Has stopped eating ice due to persistent nausea/emesis. Urinating without hesitancy. +flatus. No BM.   CT w/ 3.5 cm L thyroid mass.  Objective: Vital signs in last 24 hours: Temp:  [98.2 F (36.8 C)-98.6 F (37 C)] 98.6 F (37 C) (11/06 0700) Pulse Rate:  [87-106] 103 (11/06 0700) Resp:  [18-27] 21 (11/06 0700) BP: (129-158)/(58-94) 152/84 (11/06 0700) SpO2:  [93 %-98 %] 97 % (11/06 0700) Weight:  [93.7 kg (206 lb 9.1 oz)] 93.7 kg (206 lb 9.1 oz) (11/06 0218) Last BM Date: 12/16/16  Intake/Output from previous day: 11/05 0701 - 11/06 0700 In: 2250.8 [P.O.:180; I.V.:1970.8; IV Piggyback:100] Out: 1221 [Urine:1220; Emesis/NG output:1] Intake/Output this shift: No intake/output data recorded.  PE: Gen:  Alert, NAD, cooperative  HEENT: pupils equal and round. EOM's in tact, anicteric sclerae  Pulm:  Normal effort, CTAB Abd: Soft, TTP upper abdomen without peritonitis or guarding, +BS, no hernias Skin: warm and dry, no rashes  Psych: A&Ox3   Lab Results:  Recent Labs    12/18/16 0527 12/19/16 0509  WBC 8.9 7.8  HGB 9.0* 9.3*  HCT 29.5* 30.3*  PLT 397 365   BMET Recent Labs    12/19/16 0509 12/20/16 0405  NA 136 138  K 3.7 3.7  CL 105 107  CO2 22 24  GLUCOSE 155* 129*  BUN 9 10  CREATININE 0.51 0.51  CALCIUM 7.9* 7.9*   PT/INR No results for input(s): LABPROT, INR in the last 72 hours. CMP     Component Value Date/Time   NA 138 12/20/2016 0405   K 3.7 12/20/2016 0405   CL 107 12/20/2016 0405   CO2 24 12/20/2016 0405   GLUCOSE 129 (H) 12/20/2016 0405   BUN 10 12/20/2016 0405   CREATININE 0.51 12/20/2016 0405   CALCIUM 7.9 (L) 12/20/2016 0405   PROT 5.9 (L) 12/19/2016 0509   ALBUMIN 2.2 (L) 12/19/2016 0509   AST 14 (L) 12/19/2016 0509   ALT 12 (L) 12/19/2016 0509   ALKPHOS 87 12/19/2016 0509   BILITOT 0.6 12/19/2016 0509   GFRNONAA >60  12/20/2016 0405   GFRAA >60 12/20/2016 0405   Lipase     Component Value Date/Time   LIPASE 129 (H) 12/16/2016 1658       Studies/Results: Ct Chest Wo Contrast  Result Date: 12/20/2016 CLINICAL DATA:  Chest an abdominal mass. Chest pain or shortness of breath. Pleurisy or effusion suspected. EXAM: CT CHEST WITHOUT CONTRAST TECHNIQUE: Multidetector CT imaging of the chest was performed following the standard protocol without IV contrast. COMPARISON:  Chest radiograph and CT abdomen and pelvis CT 12/17/2016 FINDINGS: Cardiovascular: Normal heart size. No pericardial effusion. Normal caliber thoracic aorta with scattered calcifications. Coronary artery calcifications. Mediastinum/Nodes: Left thyroid gland mass measuring 3.5 cm diameter. Esophagus is decompressed. There is a large hiatal hernia, with most of the stomach above the diaphragm. There appears to be a low-attenuation mass within the intrathoracic stomach. This is better seen and characterized on the previous study in which oral contrast material was given. No significant lymphadenopathy in the chest. Lungs/Pleura: Small bilateral pleural effusions, greater on the left. Pleural effusions are slightly increased since previous study. Bilateral basilar consolidation or atelectasis, greater on the left. No pneumothorax. Airways are patent. Upper Abdomen: Upper abdominal contents again demonstrate diffuse inflammatory stranding in the upper abdominal fat with low-attenuation lesions demonstrated along the lateral segment left lobe of the liver edge  and in the epigastric region. Changes may represent mass, metastatic disease, or inflammatory process. Fat necrosis relating to the hernia is not excluded. Appearance is similar to previous study. Musculoskeletal: No chest wall mass or suspicious bone lesions identified. IMPRESSION: 1. Large hiatal hernia with most of the stomach intrathoracic. Apparent low-attenuation mass in the intrathoracic stomach,  better seen on previous study with oral contrast material. Additional low-attenuation lesions versus fluid collections in the upper abdomen also again demonstrated. Infiltration in the upper abdominal/epigastric fat suggesting inflammatory or infiltrative process. Similar appearance to previous study. 2. Small bilateral pleural effusions with basilar atelectasis or consolidation, mildly increased since previous study. 3. 3.5 cm diameter left thyroid gland mass. Due to size, consider ultrasound for further evaluation. Electronically Signed   By: Lucienne Capers M.D.   On: 12/20/2016 02:59    Anti-infectives: Anti-infectives (From admission, onward)   Start     Dose/Rate Route Frequency Ordered Stop   12/17/16 0515  piperacillin-tazobactam (ZOSYN) IVPB 3.375 g     3.375 g 100 mL/hr over 30 Minutes Intravenous  Once 12/17/16 0503 12/17/16 0636     Assessment/Plan Nausea, vomiting, hematemesis Large hiatal hernia Right renal mass Anemia - hgb 9.3, hct 30.3  afib w/ RVR Protein calorie malnutrition Hypokalemia Alcohol use versus dependence  LargeGastric body masscausing gastric outlet obstruction - s/p endoscopy and BX 11/4 Dr. Michail Sermon, follow path - CT chest 11/5 shows 3.5 cm thyroid mass and bilateral pleural effusions, no pleural nodules/masses noted. - surgical recommendations to follow pathology results, hopefully these will return today. May need to discuss case with cardiothoracic surgery due to [facility]. - continue NPO w/ ice chips as tolerated.  FEN: NPO, ice chips, PICC/TNA; NG tube PRN for persistent emesis ID: none  VTE: SCD's     LOS: 3 days    Jill Alexanders , [facility] 12/20/2016, 8:39 AM Pager: [phone] Consults: [phone] Mon-Fri 7:00 am-4:30 pm Sat-Sun 7:00 am-11:30 am

## 2016-12-20 NOTE — Progress Notes (Signed)
Notified Dr Quincy Simmonds patient anxious and Ativan Dc/s,  Also, patient stating pain med lasting about an hour then pain back. [phone]

## 2016-12-20 NOTE — Progress Notes (Signed)
PROGRESS NOTE [facility]   Nicole [name]   [id number] DOB: August 02, 1950  DOA: 12/16/2016 PCP: Patient, No Pcp Per   Brief Narrative:  Nicole [name] is a 66 year old female with medical history significant for arthritis, HTN, GERDs and alcohol abuse, presented to the emergency department with 1 week of vomiting, hematemesis and abdominal pain.  She has not being able to keep any liquids or food for about 1week.  In the emergency department she was found to have a gastric mass, A. fib with RVR.  Patient was admitted with working diagnosis of gastric mass, renal mass, A. fib with RVR and anemia.  Subjective: Patient seen and examined, she is very anxious, nauseas is somewhat better, pain well controlled with pain meds. Only 1 episode of mild emesis. No pathology report yet   Assessment & Plan: Gastric Mass  GI consultation appreciated -  Continue to awaiting biopsy results  Underwent EGD - unable to advance scope passed to the duodenum due to mass, biopsies were taken. Continue NPO for now  Pending pathology of the biopsy will determine treatment plan - IF adenocarcinoma needs further workup, if GIST could consider shrinking and J tube placement    Continue TPN for now  Will schedule Zofran for now, check daily EKG's for QT prolongation  Continue pain meds PRN   Afib w/ RVR - new diagnosis HR stable  Unclear etiology of A. fib at this moment, ?  Stress related  Continue Cardizem for now as she is NPO, she converted to sinus rhythm Keep patient in stepdown, and monitor telemetry  Renal Mass  Patient need further evaluation upon this mass, renal MRI is recommended once gastric issues and A. fib remains stable.  Nonemergent workup at this point  Hypokalemia  - Resolved   Anemia  Iron deficiency, likely from mass  FOBT positive  Hgb stable   Transfuse if Hgb < 7  Anxiety - stress related  Will add ativan PRN - will monitor during the day - may need SSRI but patient NPO no  at this time.   Diverticulosis of sigmoid colon - new diagnosis  No diverticulitis, patient is NPO no indication for abx at this time   Alcohol abuse  Has not drink for over a week now, no Hx of DT's or alcohol withdrawal  She out of window no for withdraws - no need for CIWA protocol  Alcohol cessation discussed   DVT prophylaxis: SCD's  Code Status: Full  Family Communication: None at bedside Disposition Plan: TBD   Consultants:   GI  General Surgery   Procedures:   EGD   Antimicrobials: Anti-infectives (From admission, onward)   Start     Dose/Rate Route Frequency Ordered Stop   12/17/16 0515  piperacillin-tazobactam (ZOSYN) IVPB 3.375 g     3.375 g 100 mL/hr over 30 Minutes Intravenous  Once 12/17/16 0503 12/17/16 0636        Objective: Vitals:   12/20/16 0400 12/20/16 0500 12/20/16 0604 12/20/16 0700  BP: (!) 149/82  (!) 149/80 (!) 152/84  Pulse: 94  (!) 106 (!) 103  Resp: 20 (!) 27 (!) 21 (!) 21  Temp:    98.6 F (37 C)  TempSrc:    Oral  SpO2: 98%  97% 97%  Weight:      Height:        Intake/Output Summary (Last 24 hours) at 12/20/2016 0922 Last data filed at 12/20/2016 0600 Gross per 24 hour  Intake 2250.75 ml  Output 1221  ml  Net 1029.75 ml   Filed Weights   12/18/16 0747 12/19/16 0332 12/20/16 0218  Weight: 74.4 kg (164 lb) 91.9 kg (202 lb 9.6 oz) 93.7 kg (206 lb 9.1 oz)    Examination:  General: Anxious  Cardiovascular: Tachycardia, S1/S2 +, no rubs, no gallops Respiratory: CTA bilaterally, no wheezing, no rhonchi Abdominal: Soft, Epigastric and LUQ tenderness, ND, + bowel sounds Extremities: no edema  Data Reviewed: I have personally reviewed following labs and imaging studies  CBC: Recent Labs  Lab 12/16/16 1658 12/18/16 0527 12/19/16 0509  WBC 12.7* 8.9 7.8  NEUTROABS  --   --  6.5  HGB 10.2* 9.0* 9.3*  HCT 31.8* 29.5* 30.3*  MCV 85.0 87.0 86.8  PLT 427* 397 782   Basic Metabolic Panel: Recent Labs  Lab 12/16/16 1658  12/18/16 1124 12/18/16 2328 12/19/16 0509 12/20/16 0405  NA 131* 136 135 136 138  K 3.2* 3.0* 3.7 3.7 3.7  CL 95* 103 106 105 107  CO2 24 22 21* 22 24  GLUCOSE 127* 98 136* 155* 129*  BUN 18 11 10 9 10   CREATININE 0.74 0.65 0.58 0.51 0.51  CALCIUM 8.2* 7.7* 7.7* 7.9* 7.9*  MG  --  1.7  --  1.7 1.7  PHOS  --  2.3*  --  1.9* 2.9   GFR: Estimated Creatinine Clearance: 81.7 mL/min (by C-G formula based on SCr of 0.51 mg/dL). Liver Function Tests: Recent Labs  Lab 12/16/16 1658 12/18/16 2328 12/19/16 0509  AST 16 14* 14*  ALT 13* 12* 12*  ALKPHOS 94 86 87  BILITOT 1.0 0.7 0.6  PROT 6.0* 5.3* 5.9*  ALBUMIN 2.4* 2.1* 2.2*   Recent Labs  Lab 12/16/16 1658  LIPASE 129*   No results for input(s): AMMONIA in the last 168 hours. Coagulation Profile: No results for input(s): INR, PROTIME in the last 168 hours. Cardiac Enzymes: Recent Labs  Lab 12/17/16 0854  TROPONINI <0.03   BNP (last 3 results) No results for input(s): PROBNP in the last 8760 hours. HbA1C: Recent Labs    12/20/16 0405  HGBA1C 5.9*   CBG: Recent Labs  Lab 12/19/16 0614 12/19/16 1236 12/19/16 1813 12/19/16 2348 12/20/16 0627  GLUCAP 169* 180* 130* 158* 141*   Lipid Profile: Recent Labs    12/19/16 0509  TRIG 129   Thyroid Function Tests: No results for input(s): TSH, T4TOTAL, FREET4, T3FREE, THYROIDAB in the last 72 hours. Anemia Panel: No results for input(s): VITAMINB12, FOLATE, FERRITIN, TIBC, IRON, RETICCTPCT in the last 72 hours. Sepsis Labs: No results for input(s): PROCALCITON, LATICACIDVEN in the last 168 hours.  Recent Results (from the past 240 hour(s))  MRSA PCR Screening     Status: None   Collection Time: 12/17/16  9:01 PM  Result Value Ref Range Status   MRSA by PCR NEGATIVE NEGATIVE Final    Comment:        The GeneXpert MRSA Assay (FDA approved for NASAL specimens only), is one component of a comprehensive MRSA colonization surveillance program. It is  not intended to diagnose MRSA infection nor to guide or monitor treatment for MRSA infections.      Radiology Studies: Ct Chest Wo Contrast  Result Date: 12/20/2016 CLINICAL DATA:  Chest an abdominal mass. Chest pain or shortness of breath. Pleurisy or effusion suspected. EXAM: CT CHEST WITHOUT CONTRAST TECHNIQUE: Multidetector CT imaging of the chest was performed following the standard protocol without IV contrast. COMPARISON:  Chest radiograph and CT abdomen and pelvis CT  12/17/2016 FINDINGS: Cardiovascular: Normal heart size. No pericardial effusion. Normal caliber thoracic aorta with scattered calcifications. Coronary artery calcifications. Mediastinum/Nodes: Left thyroid gland mass measuring 3.5 cm diameter. Esophagus is decompressed. There is a large hiatal hernia, with most of the stomach above the diaphragm. There appears to be a low-attenuation mass within the intrathoracic stomach. This is better seen and characterized on the previous study in which oral contrast material was given. No significant lymphadenopathy in the chest. Lungs/Pleura: Small bilateral pleural effusions, greater on the left. Pleural effusions are slightly increased since previous study. Bilateral basilar consolidation or atelectasis, greater on the left. No pneumothorax. Airways are patent. Upper Abdomen: Upper abdominal contents again demonstrate diffuse inflammatory stranding in the upper abdominal fat with low-attenuation lesions demonstrated along the lateral segment left lobe of the liver edge and in the epigastric region. Changes may represent mass, metastatic disease, or inflammatory process. Fat necrosis relating to the hernia is not excluded. Appearance is similar to previous study. Musculoskeletal: No chest wall mass or suspicious bone lesions identified. IMPRESSION: 1. Large hiatal hernia with most of the stomach intrathoracic. Apparent low-attenuation mass in the intrathoracic stomach, better seen on previous  study with oral contrast material. Additional low-attenuation lesions versus fluid collections in the upper abdomen also again demonstrated. Infiltration in the upper abdominal/epigastric fat suggesting inflammatory or infiltrative process. Similar appearance to previous study. 2. Small bilateral pleural effusions with basilar atelectasis or consolidation, mildly increased since previous study. 3. 3.5 cm diameter left thyroid gland mass. Due to size, consider ultrasound for further evaluation. Electronically Signed   By: Lucienne Capers M.D.   On: 12/20/2016 02:59    Scheduled Meds: . lisinopril  20 mg Oral Daily   And  . hydrochlorothiazide  25 mg Oral Daily  . Influenza vac split quadrivalent PF  0.5 mL Intramuscular Tomorrow-1000  . insulin aspart  0-15 Units Subcutaneous Q6H  . mouth rinse  15 mL Mouth Rinse BID  . ondansetron  4 mg Oral Q8H  . pantoprazole  40 mg Intravenous Q12H  . sodium chloride flush  10-40 mL Intracatheter Q12H   Continuous Infusions: . 0.9 % NaCl with KCl 40 mEq / L 55 mL/hr (12/20/16 0111)  . diltiazem (CARDIZEM) infusion 5 mg/hr (12/20/16 0529)  . famotidine (PEPCID) IV Stopped (12/19/16 2244)  . ondansetron [facility]) IV 8 mg (12/20/16 0905)  . TPN ADULT (ION) 50 mL/hr at 12/20/16 0217  . TPN ADULT (ION)       LOS: 3 days    Time spent: Total of 25 minutes spent with pt, greater than 50% of which was spent in discussion of  treatment, counseling and coordination of care  Chipper Oman, MD Pager: Text Page via [email or web address]   If 7PM-7AM, please contact night-coverage [email or web address] 12/20/2016, 9:22 AM

## 2016-12-20 NOTE — Progress Notes (Signed)
PHARMACY - ADULT TOTAL PARENTERAL NUTRITION CONSULT NOTE   Pharmacy Consult:  TPN Indication:  Prolonged NPO status  Patient Measurements: Height: 5\' 8"  (172.7 cm) Weight: 206 lb 9.1 oz (93.7 kg) IBW/kg (Calculated) : 63.9 TPN AdjBW (KG): 74.4 Body mass index is 31.41 kg/m.  Assessment:  64 YOF presented on 12/16/16 with N/V and essentially no PO intake x4 days.  CT shows gastric mass and EGD concerning for malignant gastric tumor.  Per Surgery, if GIST then consider Gleevec to shrink tumor and will need J-tube for EN; if adenocarcinoma then will need additional work-up.  While awaiting biopsy result to determine care, Pharmacy consulted to initiate TPN for nutritional support.  Noted patient has a history of EtOH use.  GI: Hx GERD, presented with hematemesis on Protonix gtt. Now found to have large gastric body mass causing GOO. Continues to report abdominal pain and has persistent nausea / emesis. No output charted. Albumin low at 2.2. Prealbumin 6.1. Last BM was on 11/2 but passing flatus. Endo: No hx DM. CBGs trending up with start of TPN (130-180s) on SSI Insulin requirements in the past 24 hours: 8 units Lytes: wnl exc K up to 3.7, Phos up to 2.9 after replacement, Mg remains at 1.7 Renal: new renal mass. SCr stable, UOP 0.37ml/kg/hr yesterday. BUN wnl. NS 40K at 55 ml/hr Pulm: stable on RA Cards: HTN - Afib with RVR on admit, no AC d/t hematemesis - VSS, diltiazem gtt, HCTZ, lisinopril Hepatobil: LFTs ok and Tbili wnl. Trig 129 Neuro: EtOH - thiamine, folate, multivitamin  ID: afebrile, WBC WNL - not on abx Best Practices: SCDs TPN Access: PICC placed 12/18/16 TPN start date: 12/18/16  Nutritional Goals (RD rec on 11/5): Kcal: 2000-2200  Protein: 100-115 g per day Fluid: 2.0-2.2 L  Current Nutrition:  NPO TPN   Plan:  Increase customized TPN to goal at 51ml/hr This provides 106g AA, 324 g CHO, and 47 g of lipids which adds up to 1,994 kCal meeting 100% of patient  needs Lytes in TPN: increase K, increase Mg Daily multivitamin and trace elements in TPN Add thiamine 100mg  and folic acid 1mg  to TPN Add 10 units of regular insulin to TPN Continue moderate SSI Q4h Reduce NS w/ 40K to 10 ml/hr when TPN increases tonight to meet overall fluid goal Monitor TPN labs F/U iron supplementation when possible   Elenor Quinones, PharmD, BCPS Clinical Pharmacist Pager [phone] 12/20/2016 7:24 AM

## 2016-12-20 NOTE — Consult Note (Signed)
Reason for Consult: operative approach to gastric tumor Referring Physician: CCS  Nicole [name] is an 66 y.o. female.  HPI: Nicole [name] who presented on 11/3 with a 1 week history of nausea, constant vomiting, hematemesis and abdominal pain. Symptoms began after a drinking binge. Pain in LUQ radiating to back and chest.  Was admitted. W/u included CT abdomen and pelvis and later chest as well. Was found to have a large hiatal hernia and a 12 cm gastric mass. EGD showed normal upper and mid esophagus. Lower esophagus distorted. Large gastric tumor noted. Unfortunately biopsies were nondiagnostic.  It is unclear from the notes whether repeat biopsy is planned.  Nausea and emesis improved. She is on TNA. She was noted to be in atria fibrillation with RVR in the ED. She is currently in SR on diltiazem drip.  Past Medical History:  Diagnosis Date  . Arthritis   . Chronic alcoholism ([place])   . Chronic left shoulder pain   . Heartburn   . Hiatal hernia 12/17/2016   stomach noted in chest on CT scan  . Hypertension     Past Surgical History:  Procedure Laterality Date  . ANKLE FRACTURE SURGERY Right 2008   screws  . ANKLE FRACTURE SURGERY Left 2008   plates    Family History  Problem Relation Age of Onset  . Heart attack Father   . Ulcers Father   . Diabetes Sister   . Cancer Brother   . Diabetes Sister   . Diabetes Sister     Social History:  reports that  has never smoked. she has never used smokeless tobacco. She reports that she drinks alcohol. She reports that she does not use drugs.  Allergies:  Allergies  Allergen Reactions  . Other Shortness Of Breath and Anxiety    Reaction to fast food and processed food  . Shellfish Allergy Shortness Of Breath  . Iodinated Diagnostic Agents Swelling    Hand swelling from contrast dye for MRI    Medications:  Scheduled: . lisinopril  20 mg Oral Daily   And  . hydrochlorothiazide  25 mg Oral Daily  . insulin aspart  0-15  Units Subcutaneous Q6H  . mouth rinse  15 mL Mouth Rinse BID  . ondansetron  4 mg Oral Q8H  . pantoprazole  40 mg Intravenous Q12H  . sodium chloride flush  10-40 mL Intracatheter Q12H    Results for orders placed or performed during the hospital encounter of 12/16/16 (from the past 48 hour(s))  Glucose, capillary     Status: Abnormal   Collection Time: 12/18/16  7:47 PM  Result Value Ref Range   Glucose-Capillary 136 (H) 65 - 99 mg/dL  Glucose, capillary     Status: Abnormal   Collection Time: 12/18/16 11:19 PM  Result Value Ref Range   Glucose-Capillary 146 (H) 65 - 99 mg/dL  Comprehensive metabolic panel     Status: Abnormal   Collection Time: 12/18/16 11:28 PM  Result Value Ref Range   Sodium 135 135 - 145 mmol/L   Potassium 3.7 3.5 - 5.1 mmol/L    Comment: DELTA CHECK NOTED   Chloride 106 101 - 111 mmol/L   CO2 21 (L) 22 - 32 mmol/L   Glucose, Bld 136 (H) 65 - 99 mg/dL   BUN 10 6 - 20 mg/dL   Creatinine, Ser 0.58 0.44 - 1.00 mg/dL   Calcium 7.7 (L) 8.9 - 10.3 mg/dL   Total Protein 5.3 (L) 6.5 - 8.1 g/dL  Albumin 2.1 (L) 3.5 - 5.0 g/dL   AST 14 (L) 15 - 41 U/L   ALT 12 (L) 14 - 54 U/L   Alkaline Phosphatase 86 38 - 126 U/L   Total Bilirubin 0.7 0.3 - 1.2 mg/dL   GFR calc non Af Amer >60 >60 mL/min   GFR calc Af Amer >60 >60 mL/min    Comment: (NOTE) The eGFR has been calculated using the CKD EPI equation. This calculation has not been validated in all clinical situations. eGFR's persistently <60 mL/min signify possible Chronic Kidney Disease.    Anion gap 8 5 - 15  Comprehensive metabolic panel     Status: Abnormal   Collection Time: 12/19/16  5:09 AM  Result Value Ref Range   Sodium 136 135 - 145 mmol/L   Potassium 3.7 3.5 - 5.1 mmol/L   Chloride 105 101 - 111 mmol/L   CO2 22 22 - 32 mmol/L   Glucose, Bld 155 (H) 65 - 99 mg/dL   BUN 9 6 - 20 mg/dL   Creatinine, Ser 0.51 0.44 - 1.00 mg/dL   Calcium 7.9 (L) 8.9 - 10.3 mg/dL   Total Protein 5.9 (L) 6.5 - 8.1  g/dL   Albumin 2.2 (L) 3.5 - 5.0 g/dL   AST 14 (L) 15 - 41 U/L   ALT 12 (L) 14 - 54 U/L   Alkaline Phosphatase 87 38 - 126 U/L   Total Bilirubin 0.6 0.3 - 1.2 mg/dL   GFR calc non Af Amer >60 >60 mL/min   GFR calc Af Amer >60 >60 mL/min    Comment: (NOTE) The eGFR has been calculated using the CKD EPI equation. This calculation has not been validated in all clinical situations. eGFR's persistently <60 mL/min signify possible Chronic Kidney Disease.    Anion gap 9 5 - 15  Prealbumin     Status: Abnormal   Collection Time: 12/19/16  5:09 AM  Result Value Ref Range   Prealbumin 6.1 (L) 18 - 38 mg/dL  Magnesium     Status: None   Collection Time: 12/19/16  5:09 AM  Result Value Ref Range   Magnesium 1.7 1.7 - 2.4 mg/dL  Phosphorus     Status: Abnormal   Collection Time: 12/19/16  5:09 AM  Result Value Ref Range   Phosphorus 1.9 (L) 2.5 - 4.6 mg/dL  CBC     Status: Abnormal   Collection Time: 12/19/16  5:09 AM  Result Value Ref Range   WBC 7.8 4.0 - 10.5 K/uL   RBC 3.49 (L) 3.87 - 5.11 MIL/uL   Hemoglobin 9.3 (L) 12.0 - 15.0 g/dL   HCT 30.3 (L) 36.0 - 46.0 %   MCV 86.8 78.0 - 100.0 fL   MCH 26.6 26.0 - 34.0 pg   MCHC 30.7 30.0 - 36.0 g/dL   RDW 15.4 11.5 - 15.5 %   Platelets 365 150 - 400 K/uL  Differential     Status: Abnormal   Collection Time: 12/19/16  5:09 AM  Result Value Ref Range   Neutrophils Relative % 84 %   Neutro Abs 6.5 1.7 - 7.7 K/uL   Lymphocytes Relative 6 %   Lymphs Abs 0.5 (L) 0.7 - 4.0 K/uL   Monocytes Relative 4 %   Monocytes Absolute 0.3 0.1 - 1.0 K/uL   Eosinophils Relative 5 %   Eosinophils Absolute 0.4 0.0 - 0.7 K/uL   Basophils Relative 1 %   Basophils Absolute 0.0 0.0 - 0.1 K/uL  Triglycerides  Status: None   Collection Time: 12/19/16  5:09 AM  Result Value Ref Range   Triglycerides 129 <150 mg/dL  Glucose, capillary     Status: Abnormal   Collection Time: 12/19/16  6:14 AM  Result Value Ref Range   Glucose-Capillary 169 (H) 65 - 99  mg/dL  Glucose, capillary     Status: Abnormal   Collection Time: 12/19/16 12:36 PM  Result Value Ref Range   Glucose-Capillary 180 (H) 65 - 99 mg/dL  Glucose, capillary     Status: Abnormal   Collection Time: 12/19/16  6:13 PM  Result Value Ref Range   Glucose-Capillary 130 (H) 65 - 99 mg/dL  Glucose, capillary     Status: Abnormal   Collection Time: 12/19/16 11:48 PM  Result Value Ref Range   Glucose-Capillary 158 (H) 65 - 99 mg/dL  Basic metabolic panel     Status: Abnormal   Collection Time: 12/20/16  4:05 AM  Result Value Ref Range   Sodium 138 135 - 145 mmol/L   Potassium 3.7 3.5 - 5.1 mmol/L   Chloride 107 101 - 111 mmol/L   CO2 24 22 - 32 mmol/L   Glucose, Bld 129 (H) 65 - 99 mg/dL   BUN 10 6 - 20 mg/dL   Creatinine, Ser 0.51 0.44 - 1.00 mg/dL   Calcium 7.9 (L) 8.9 - 10.3 mg/dL   GFR calc non Af Amer >60 >60 mL/min   GFR calc Af Amer >60 >60 mL/min    Comment: (NOTE) The eGFR has been calculated using the CKD EPI equation. This calculation has not been validated in all clinical situations. eGFR's persistently <60 mL/min signify possible Chronic Kidney Disease.    Anion gap 7 5 - 15  Phosphorus     Status: None   Collection Time: 12/20/16  4:05 AM  Result Value Ref Range   Phosphorus 2.9 2.5 - 4.6 mg/dL  Magnesium     Status: None   Collection Time: 12/20/16  4:05 AM  Result Value Ref Range   Magnesium 1.7 1.7 - 2.4 mg/dL  Hemoglobin A1c     Status: Abnormal   Collection Time: 12/20/16  4:05 AM  Result Value Ref Range   Hgb A1c MFr Bld 5.9 (H) 4.8 - 5.6 %    Comment: (NOTE) Pre diabetes:          5.7%-6.4% Diabetes:              >6.4% Glycemic control for   <7.0% adults with diabetes    Mean Plasma Glucose 122.63 mg/dL  Glucose, capillary     Status: Abnormal   Collection Time: 12/20/16  6:27 AM  Result Value Ref Range   Glucose-Capillary 141 (H) 65 - 99 mg/dL  Glucose, capillary     Status: Abnormal   Collection Time: 12/20/16 12:29 PM  Result Value  Ref Range   Glucose-Capillary 155 (H) 65 - 99 mg/dL    Ct Chest Wo Contrast  Result Date: 12/20/2016 CLINICAL DATA:  Chest an abdominal mass. Chest pain or shortness of breath. Pleurisy or effusion suspected. EXAM: CT CHEST WITHOUT CONTRAST TECHNIQUE: Multidetector CT imaging of the chest was performed following the standard protocol without IV contrast. COMPARISON:  Chest radiograph and CT abdomen and pelvis CT 12/17/2016 FINDINGS: Cardiovascular: Normal heart size. No pericardial effusion. Normal caliber thoracic aorta with scattered calcifications. Coronary artery calcifications. Mediastinum/Nodes: Left thyroid gland mass measuring 3.5 cm diameter. Esophagus is decompressed. There is a large hiatal hernia, with most of the  stomach above the diaphragm. There appears to be a low-attenuation mass within the intrathoracic stomach. This is better seen and characterized on the previous study in which oral contrast material was given. No significant lymphadenopathy in the chest. Lungs/Pleura: Small bilateral pleural effusions, greater on the left. Pleural effusions are slightly increased since previous study. Bilateral basilar consolidation or atelectasis, greater on the left. No pneumothorax. Airways are patent. Upper Abdomen: Upper abdominal contents again demonstrate diffuse inflammatory stranding in the upper abdominal fat with low-attenuation lesions demonstrated along the lateral segment left lobe of the liver edge and in the epigastric region. Changes may represent mass, metastatic disease, or inflammatory process. Fat necrosis relating to the hernia is not excluded. Appearance is similar to previous study. Musculoskeletal: No chest wall mass or suspicious bone lesions identified. IMPRESSION: 1. Large hiatal hernia with most of the stomach intrathoracic. Apparent low-attenuation mass in the intrathoracic stomach, better seen on previous study with oral contrast material. Additional low-attenuation lesions  versus fluid collections in the upper abdomen also again demonstrated. Infiltration in the upper abdominal/epigastric fat suggesting inflammatory or infiltrative process. Similar appearance to previous study. 2. Small bilateral pleural effusions with basilar atelectasis or consolidation, mildly increased since previous study. 3. 3.5 cm diameter left thyroid gland mass. Due to size, consider ultrasound for further evaluation. Electronically Signed   By: Lucienne Capers M.D.   On: 12/20/2016 02:59   I personally reviewed the CT chest and concur with the findings noted above  Review of Systems  Constitutional: Positive for malaise/fatigue and weight loss.  Respiratory: Positive for shortness of breath.   Gastrointestinal: Positive for abdominal pain, nausea and vomiting.  Musculoskeletal: Positive for back pain.   Blood pressure (!) 149/82, pulse (!) 102, temperature 98.7 F (37.1 C), temperature source Oral, resp. rate (!) 23, height 5' 8"  (1.727 m), weight 206 lb 9.1 oz (93.7 kg), SpO2 99 %. Physical Exam  Vitals reviewed. Constitutional: She is oriented to person, place, and time. No distress.  obese  HENT:  Head: Normocephalic and atraumatic.  Mouth/Throat: No oropharyngeal exudate.  Eyes: Conjunctivae and EOM are normal. No scleral icterus.  Neck: Neck supple.  Cardiovascular: Normal rate and regular rhythm.  No murmur heard. Respiratory: Effort normal. No respiratory distress. She has no wheezes. She has no rales.  Absent BS left base  GI: Soft. She exhibits no distension. There is no tenderness.  Musculoskeletal: She exhibits no edema.  Lymphadenopathy:    She has no cervical adenopathy.  Neurological: She is alert and oriented to person, place, and time. No cranial nerve deficit. She exhibits normal muscle tone.  Skin: Skin is warm and dry.    Assessment/Plan: 66 yo [name] who presented with nausea, vomiting, hematemesis and abdominal pain and has been found to have a large  gastric mass. Biopsies, unfortunately, were negative. She has a large hiatal hernia with much of the stomach displaced into the chest.  I think the best approach for this is abdominal. It is possible that there will be adhesions in the chest which will necessitate an accessory thoracic incision to deal with, but I think that is unlikely.  It is not clear from the notes what operation is planned or when and whether a repeat biopsy is planned or being considered.  Melrose Nakayama 12/20/2016, 5:18 PM

## 2016-12-21 ENCOUNTER — Encounter (HOSPITAL_COMMUNITY): Payer: Self-pay | Admitting: Certified Registered"

## 2016-12-21 LAB — MAGNESIUM: MAGNESIUM: 1.8 mg/dL (ref 1.7–2.4)

## 2016-12-21 LAB — GLUCOSE, CAPILLARY
GLUCOSE-CAPILLARY: 148 mg/dL — AB (ref 65–99)
GLUCOSE-CAPILLARY: 164 mg/dL — AB (ref 65–99)
GLUCOSE-CAPILLARY: 169 mg/dL — AB (ref 65–99)
Glucose-Capillary: 164 mg/dL — ABNORMAL HIGH (ref 65–99)

## 2016-12-21 LAB — CBC
HCT: 28.6 % — ABNORMAL LOW (ref 36.0–46.0)
HEMOGLOBIN: 8.9 g/dL — AB (ref 12.0–15.0)
MCH: 26.7 pg (ref 26.0–34.0)
MCHC: 31.1 g/dL (ref 30.0–36.0)
MCV: 85.9 fL (ref 78.0–100.0)
PLATELETS: 330 10*3/uL (ref 150–400)
RBC: 3.33 MIL/uL — AB (ref 3.87–5.11)
RDW: 15.4 % (ref 11.5–15.5)
WBC: 9.7 10*3/uL (ref 4.0–10.5)

## 2016-12-21 LAB — BASIC METABOLIC PANEL
Anion gap: 7 (ref 5–15)
BUN: 12 mg/dL (ref 6–20)
CO2: 25 mmol/L (ref 22–32)
CREATININE: 0.51 mg/dL (ref 0.44–1.00)
Calcium: 8 mg/dL — ABNORMAL LOW (ref 8.9–10.3)
Chloride: 103 mmol/L (ref 101–111)
Glucose, Bld: 139 mg/dL — ABNORMAL HIGH (ref 65–99)
Potassium: 3.9 mmol/L (ref 3.5–5.1)
SODIUM: 135 mmol/L (ref 135–145)

## 2016-12-21 MED ORDER — TRAVASOL 10 % IV SOLN
INTRAVENOUS | Status: AC
  Administered 2016-12-21: 18:00:00 via INTRAVENOUS
  Filled 2016-12-21: qty 1080

## 2016-12-21 MED ORDER — LEVALBUTEROL HCL 0.63 MG/3ML IN NEBU
0.6300 mg | INHALATION_SOLUTION | Freq: Four times a day (QID) | RESPIRATORY_TRACT | Status: DC
  Administered 2016-12-22 (×4): 0.63 mg via RESPIRATORY_TRACT
  Filled 2016-12-21 (×4): qty 3

## 2016-12-21 MED ORDER — MORPHINE SULFATE (PF) 4 MG/ML IV SOLN
2.0000 mg | INTRAVENOUS | Status: DC | PRN
  Administered 2016-12-21 – 2016-12-23 (×5): 4 mg via INTRAVENOUS
  Filled 2016-12-21 (×5): qty 1

## 2016-12-21 MED ORDER — METOPROLOL TARTRATE 5 MG/5ML IV SOLN
5.0000 mg | INTRAVENOUS | Status: DC | PRN
  Administered 2016-12-21 (×2): 5 mg via INTRAVENOUS
  Filled 2016-12-21 (×2): qty 5

## 2016-12-21 MED ORDER — LEVALBUTEROL HCL 0.63 MG/3ML IN NEBU
0.6300 mg | INHALATION_SOLUTION | Freq: Three times a day (TID) | RESPIRATORY_TRACT | Status: DC
  Administered 2016-12-21: 0.63 mg via RESPIRATORY_TRACT
  Filled 2016-12-21: qty 3

## 2016-12-21 MED ORDER — METOPROLOL TARTRATE 5 MG/5ML IV SOLN
INTRAVENOUS | Status: AC
  Administered 2016-12-21: 5 mg via INTRAVENOUS
  Filled 2016-12-21: qty 5

## 2016-12-21 MED ORDER — FUROSEMIDE 10 MG/ML IJ SOLN
20.0000 mg | Freq: Once | INTRAMUSCULAR | Status: AC
  Administered 2016-12-21: 20 mg via INTRAVENOUS
  Filled 2016-12-21: qty 2

## 2016-12-21 NOTE — Progress Notes (Signed)
PHARMACY - ADULT TOTAL PARENTERAL NUTRITION CONSULT NOTE   Pharmacy Consult:  TPN Indication:  Prolonged NPO status / GOO  Patient Measurements: Height: 5\' 8"  (172.7 cm) Weight: 208 lb 5.4 oz (94.5 kg) IBW/kg (Calculated) : 63.9 TPN AdjBW (KG): 74.4 Body mass index is 31.68 kg/m.  Assessment:  33 YOF presented on 12/16/16 with N/V and essentially no PO intake x4 days.  CT shows gastric mass and EGD concerning for malignant gastric tumor.  Per Surgery, if GIST then consider Gleevec to shrink tumor and will need J-tube for EN; if adenocarcinoma then will need additional work-up.  While awaiting biopsy result to determine care, Pharmacy consulted to initiate TPN for nutritional support.  Noted patient has a history of EtOH use.  GI: Hx GERD, presented with hematemesis on Protonix + Pepcid. New large gastric body mass causing GOO and hiatal hernia. Continues to report abdominal pain and persistent nausea / emesis.  LBM 11/2, +flatus.  Scheduled Zofran Endo: thyroid gland mass on CT.  No hx DM - CBGs controlled Insulin requirements in the past 24 hours: 11 units mSSI Lytes: 11/6 labs - all WNL Renal: new renal mass. SCr stable, BUN WNL - UOP 0.7 ml/kg/hr, NS 40K at 10 ml/hr, net +6L since admit Pulm: small pleural effusion on CT.  RA >> 2L [place] Cards: HTN - Afib with RVR on admit, no AC d/t hematemesis - BP controlled, tachy, diltiazem gtt, HCTZ, lisinopril Hepatobil: LFTs / tbili / TG WNL Neuro: EtOH - thiamine, folate, multivitamin in TPN.  PRN morphine/Ativan ID: afebrile, WBC WNL - not on abx Best Practices: SCDs TPN Access: PICC placed 12/18/16 TPN start date: 12/18/16  Nutritional Goals (RD rec on 11/5): Kcal: 2000-2200  Protein: 100-115 g per day Fluid: 2.0-2.2 L  Current Nutrition:  TPN   Plan:  Continue TPN at 75 ml/hr, providing 108g AA, 324 g CHO, and 47 g of lipids, which adds up to 2001 kCal and meeting 100% of patient needs Lytes in TPN: no change today Daily multivitamin  and trace elements in TPN Add thiamine 100mg  and folic acid 1mg  to TPN Continue moderate SSI Q6H + 10 units regular insulin in TPN F/U iron supplementation when possible F/U AM labs   Nicole [name], PharmD, BCPS Pager:  [phone] 12/21/2016, 7:40 AM

## 2016-12-21 NOTE — Progress Notes (Signed)
[facility] Progress Note  Patient: Nicole [name] [id number]   PCP: Patient, No Pcp Per DOB: April 29, 1950   DOA: 12/16/2016   DOS: 12/21/2016   Date of Service: the patient was seen and examined on 12/21/2016  Subjective: Continues to have abdominal pain as well as nausea.  Has some upper airway wheezing.  No chest pain, passing gas no BM.  Brief hospital course: Pt. with PMH of arthritis, HTN, GERD, alcohol abuse; admitted on 12/16/2016, presented with complaint of vomiting and hematemesis as well as abdominal pain, was found to have gastric outlet obstruction from hiatal hernia as well as large gastric ulcer and mass.  Underwent EGD with biopsy, no malignancy seen on the biopsy results.  General surgery as well as cardiothoracic surgery were consulted. Currently further plan is scheduled for surgery on Friday.  Assessment and Plan: 1.  Large hiatal hernia. Large gastric mass GI consulted, S/P EGD.  Mass was large enough to hinder advancement of the scope to the duodenum. Biopsies inconclusive. General surgery consulted, patient remains n.p.o. for now started on TPN. Cardiothoracic surgery consulted as well due to presence of the large hiatal hernia and possible need for thoracotomy. At present plan is to proceed with surgical treatment for the large gastric mass as well as hiatal hernia tentatively scheduled on Friday. Continue as needed Zofran.  Continue pain medication, continue TPN.  2.  A. fib with RVR. Rate controlled. Started on Cardizem, continue Cardizem infusion right now. CHADSVASC score 3.  Currently holding anticoagulation as a need for surgery, has a gastric mass and presented with hematemesis. Continue to monitor on telemetry Echocardiogram shows 71-06% EF, diastolic dysfunction  3.  Renal cyst. Patient will need nonurgent renal MRI most likely as an outpatient once stable with probable active issue.  4.  Anemia.  Anemia of chronic GI blood loss. Likely chronic  GI bleed from the mass. FOBT positive. Transfuse for hemoglobin less than 7, for surgery transfuse to hemoglobin less than 8.  5.  Anxiety. Continue as needed Ativan.  6.  Hypokalemia. Replaced and corrected.  7.  History of alcohol abuse. In the hospital since over a week. No evidence of DT or withdrawal for now. Continue as needed Ativan. Alcohol cessation recommended.  8.  Prediabetes. Hemoglobin A1c 5.9. Continue insulin for coverage right now.  Diet: NPO DVT Prophylaxis: mechanical compression device  Advance goals of care discussion: full code  Family Communication: family was present at bedside, at the time of interview. The pt provided permission to discuss medical plan with the family. Opportunity was given to ask question and all questions were answered satisfactorily.   Disposition:  Discharge to be determined.   Consultants: cardiothoracic, cardiology, gastroenterology, General surgery  Procedures: EGD  Antibiotics: Anti-infectives (From admission, onward)   Start     Dose/Rate Route Frequency Ordered Stop   12/17/16 0515  piperacillin-tazobactam (ZOSYN) IVPB 3.375 g     3.375 g 100 mL/hr over 30 Minutes Intravenous  Once 12/17/16 0503 12/17/16 0636       Objective: Physical Exam: Vitals:   12/21/16 1200 12/21/16 1223 12/21/16 1300 12/21/16 1400  BP: (!) 147/100 (!) 147/100 136/86 106/61  Pulse: 98 (!) 115 (!) 102 95  Resp: 19 (!) 21 (!) 21 20  Temp:      TempSrc:      SpO2: 98%  98% 96%  Weight:      Height:        Intake/Output Summary (Last 24 hours) at 12/21/2016 1618 Last  data filed at 12/21/2016 1224 Gross per 24 hour  Intake 2485.74 ml  Output 1900 ml  Net 585.74 ml   Filed Weights   12/19/16 0332 12/20/16 0218 12/21/16 0445  Weight: 91.9 kg (202 lb 9.6 oz) 93.7 kg (206 lb 9.1 oz) 94.5 kg (208 lb 5.4 oz)   General: Alert, Awake and Oriented to Time, Place and Person. Appear in moderate distress, affect appropriate Eyes: PERRL,  Conjunctiva normal ENT: Oral Mucosa clear moist. Neck: difficult to assess JVD, no Abnormal Mass Or lumps Cardiovascular: S1 and S2 Present, no Murmur, Peripheral Pulses Present Respiratory: increased respiratory effort, Bilateral Air entry equal and Decreased, no use of accessory muscle, basal Crackles, upper airway wheezes Abdomen: Bowel Sound absent, Soft and mild tenderness, no hernia Skin: no redness, no Rash, no induration Extremities: trace Pedal edema, no calf tenderness Neurologic: Grossly no focal neuro deficit. Bilaterally Equal motor strength  Data Reviewed: CBC: Recent Labs  Lab 12/16/16 1658 12/18/16 0527 12/19/16 0509 12/21/16 0746  WBC 12.7* 8.9 7.8 9.7  NEUTROABS  --   --  6.5  --   HGB 10.2* 9.0* 9.3* 8.9*  HCT 31.8* 29.5* 30.3* 28.6*  MCV 85.0 87.0 86.8 85.9  PLT 427* 397 365 700   Basic Metabolic Panel: Recent Labs  Lab 12/18/16 1124 12/18/16 2328 12/19/16 0509 12/20/16 0405 12/21/16 0746  NA 136 135 136 138 135  K 3.0* 3.7 3.7 3.7 3.9  CL 103 106 105 107 103  CO2 22 21* 22 24 25   GLUCOSE 98 136* 155* 129* 139*  BUN 11 10 9 10 12   CREATININE 0.65 0.58 0.51 0.51 0.51  CALCIUM 7.7* 7.7* 7.9* 7.9* 8.0*  MG 1.7  --  1.7 1.7 1.8  PHOS 2.3*  --  1.9* 2.9  --     Liver Function Tests: Recent Labs  Lab 12/16/16 1658 12/18/16 2328 12/19/16 0509  AST 16 14* 14*  ALT 13* 12* 12*  ALKPHOS 94 86 87  BILITOT 1.0 0.7 0.6  PROT 6.0* 5.3* 5.9*  ALBUMIN 2.4* 2.1* 2.2*   Recent Labs  Lab 12/16/16 1658  LIPASE 129*   No results for input(s): AMMONIA in the last 168 hours. Coagulation Profile: No results for input(s): INR, PROTIME in the last 168 hours. Cardiac Enzymes: Recent Labs  Lab 12/17/16 0854  TROPONINI <0.03   BNP (last 3 results) No results for input(s): PROBNP in the last 8760 hours. CBG: Recent Labs  Lab 12/20/16 1229 12/20/16 1751 12/20/16 2323 12/21/16 0539 12/21/16 1221  GLUCAP 155* 151* 140* 148* 164*   Studies: No  results found.  Scheduled Meds: . lisinopril  20 mg Oral Daily   And  . hydrochlorothiazide  25 mg Oral Daily  . insulin aspart  0-15 Units Subcutaneous Q6H  . mouth rinse  15 mL Mouth Rinse BID  . ondansetron  4 mg Oral Q8H  . pantoprazole  40 mg Intravenous Q12H  . sodium chloride flush  10-40 mL Intracatheter Q12H   Continuous Infusions: . 0.9 % NaCl with KCl 40 mEq / L 10 mL/hr (12/20/16 1734)  . diltiazem (CARDIZEM) infusion 5 mg/hr (12/21/16 0021)  . famotidine (PEPCID) IV Stopped (12/21/16 1114)  . ondansetron (ZOFRAN) IV Stopped (12/21/16 1331)  . TPN ADULT (ION) 75 mL/hr at 12/20/16 1726  . TPN ADULT (ION)     PRN Meds: acetaminophen **OR** acetaminophen, LORazepam, morphine injection, sodium chloride flush  Time spent: 35 minutes  Author: Berle Mull, MD [facility] Pager: [phone] 12/21/2016  4:18 PM  If 7PM-7AM, please contact night-coverage at [email or web address], password [facility]

## 2016-12-21 NOTE — Progress Notes (Signed)
3 Days Post-Op   Subjective/Chief Complaint: No complaints   Objective: Vital signs in last 24 hours: Temp:  [98.3 F (36.8 C)-99.2 F (37.3 C)] 99.2 F (37.3 C) (11/07 0730) Pulse Rate:  [94-115] 102 (11/07 1300) Resp:  [16-32] 21 (11/07 1300) BP: (120-161)/(77-104) 136/86 (11/07 1300) SpO2:  [94 %-100 %] 98 % (11/07 1300) Weight:  [94.5 kg (208 lb 5.4 oz)] 94.5 kg (208 lb 5.4 oz) (11/07 0445) Last BM Date: 12/16/16  Intake/Output from previous day: 11/06 0701 - 11/07 0700 In: 2485.7 [I.V.:2169.7; IV Piggyback:316] Out: 1700 [Urine:1700] Intake/Output this shift: Total I/O In: -  Out: 1000 [Urine:1000]  General appearance: alert and cooperative Resp: clear to auscultation bilaterally Cardio: regular rate and rhythm GI: soft, minimal tenderness  Lab Results:  Recent Labs    12/19/16 0509 12/21/16 0746  WBC 7.8 9.7  HGB 9.3* 8.9*  HCT 30.3* 28.6*  PLT 365 330   BMET Recent Labs    12/20/16 0405 12/21/16 0746  NA 138 135  K 3.7 3.9  CL 107 103  CO2 24 25  GLUCOSE 129* 139*  BUN 10 12  CREATININE 0.51 0.51  CALCIUM 7.9* 8.0*   PT/INR No results for input(s): LABPROT, INR in the last 72 hours. ABG No results for input(s): PHART, HCO3 in the last 72 hours.  Invalid input(s): PCO2, PO2  Studies/Results: Ct Chest Wo Contrast  Result Date: 12/20/2016 CLINICAL DATA:  Chest an abdominal mass. Chest pain or shortness of breath. Pleurisy or effusion suspected. EXAM: CT CHEST WITHOUT CONTRAST TECHNIQUE: Multidetector CT imaging of the chest was performed following the standard protocol without IV contrast. COMPARISON:  Chest radiograph and CT abdomen and pelvis CT 12/17/2016 FINDINGS: Cardiovascular: Normal heart size. No pericardial effusion. Normal caliber thoracic aorta with scattered calcifications. Coronary artery calcifications. Mediastinum/Nodes: Left thyroid gland mass measuring 3.5 cm diameter. Esophagus is decompressed. There is a large hiatal hernia,  with most of the stomach above the diaphragm. There appears to be a low-attenuation mass within the intrathoracic stomach. This is better seen and characterized on the previous study in which oral contrast material was given. No significant lymphadenopathy in the chest. Lungs/Pleura: Small bilateral pleural effusions, greater on the left. Pleural effusions are slightly increased since previous study. Bilateral basilar consolidation or atelectasis, greater on the left. No pneumothorax. Airways are patent. Upper Abdomen: Upper abdominal contents again demonstrate diffuse inflammatory stranding in the upper abdominal fat with low-attenuation lesions demonstrated along the lateral segment left lobe of the liver edge and in the epigastric region. Changes may represent mass, metastatic disease, or inflammatory process. Fat necrosis relating to the hernia is not excluded. Appearance is similar to previous study. Musculoskeletal: No chest wall mass or suspicious bone lesions identified. IMPRESSION: 1. Large hiatal hernia with most of the stomach intrathoracic. Apparent low-attenuation mass in the intrathoracic stomach, better seen on previous study with oral contrast material. Additional low-attenuation lesions versus fluid collections in the upper abdomen also again demonstrated. Infiltration in the upper abdominal/epigastric fat suggesting inflammatory or infiltrative process. Similar appearance to previous study. 2. Small bilateral pleural effusions with basilar atelectasis or consolidation, mildly increased since previous study. 3. 3.5 cm diameter left thyroid gland mass. Due to size, consider ultrasound for further evaluation. Electronically Signed   By: Lucienne Capers M.D.   On: 12/20/2016 02:59    Anti-infectives: Anti-infectives (From admission, onward)   Start     Dose/Rate Route Frequency Ordered Stop   12/17/16 0515  piperacillin-tazobactam (ZOSYN) IVPB 3.375  g     3.375 g 100 mL/hr over 30 Minutes  Intravenous  Once 12/17/16 0503 12/17/16 0636      Assessment/Plan: s/p Procedure(s): ESOPHAGOGASTRODUODENOSCOPY (EGD) WITH PROPOFOL (N/A) Continue tpn for nutrition support  Large gastric mass in large hiatal hernia Endo did not get a dx Will need surgery for possible resection. May need thoracotomy to access the stomach if it doesn't reduce. I have discussed with her the risks and benefits of the surgery as well as some of the technical aspects and she understands and wishes to proceed. Plan for surgery Friday Type and cross tomorrow  LOS: 4 days    TOTH III,PAUL S 12/21/2016

## 2016-12-21 NOTE — Progress Notes (Signed)
RT called and made aware of new orders for Xopenex nebs q8 hrs.

## 2016-12-21 NOTE — Plan of Care (Signed)
  Safety: Ability to remain free from injury will improve 12/21/2016 0428 - Progressing by Abbee Cremeens, Earmon Phoenix, RN Note Patient requiring reminders to stay in bed and call for assistance. Once during shift, patient was found on [facility] and when asked about it- said that she called for assistance. Explained that she had to call us to help her for safety reasons- Bed alarm armed.  12/21/2016 0419 - Progressing by [name], RN Note Patient requires reminders to stay in bed and call for help. She is mostly alert and oriented; however, once during shift, patient got herself out of bed to the commode and claimed she had called for assistance. Bed alarms being utilized now for patient safety.   Pain Managment: General experience of comfort will improve 12/21/2016 0428 - Progressing by Marquia Costello A, RN Note Patient having increased WOB due to pressure from abdominal mass- Ativan and Morphine given per MD orders.  12/21/2016 0419 - Progressing by [name], RN Note Patient restless and experiencing increased WOB due to pressure of mass in chest. Patient received morphine and Ativan to help with symptoms.    Bowel/Gastric: Will not experience complications related to bowel motility 12/21/2016 0428 - Not Progressing by Krista Som A, RN Note Patient has not had a bowel movement since prior to arrival- would benefit from bowel regimen.  12/21/2016 0419 - Not Progressing by [name], RN Note Patient has not had a bowel movement since prior to admission. Recommend patient has a bowel regimen.

## 2016-12-22 ENCOUNTER — Inpatient Hospital Stay (HOSPITAL_COMMUNITY): Payer: Medicare Other

## 2016-12-22 LAB — BASIC METABOLIC PANEL
ANION GAP: 9 (ref 5–15)
BUN: 17 mg/dL (ref 6–20)
CHLORIDE: 100 mmol/L — AB (ref 101–111)
CO2: 24 mmol/L (ref 22–32)
Calcium: 8.1 mg/dL — ABNORMAL LOW (ref 8.9–10.3)
Creatinine, Ser: 0.57 mg/dL (ref 0.44–1.00)
GFR calc non Af Amer: >60 mL/min (ref >60)
Glucose, Bld: 144 mg/dL — ABNORMAL HIGH (ref 65–99)
POTASSIUM: 4 mmol/L (ref 3.5–5.1)
Sodium: 133 mmol/L — ABNORMAL LOW (ref 135–145)

## 2016-12-22 LAB — PHOSPHORUS: Phosphorus: 4.3 mg/dL (ref 2.5–4.6)

## 2016-12-22 LAB — CBC
HEMATOCRIT: 30.1 % — AB (ref 36.0–46.0)
HEMOGLOBIN: 9.5 g/dL — AB (ref 12.0–15.0)
MCH: 27.3 pg (ref 26.0–34.0)
MCHC: 31.6 g/dL (ref 30.0–36.0)
MCV: 86.5 fL (ref 78.0–100.0)
Platelets: 324 10*3/uL (ref 150–400)
RBC: 3.48 MIL/uL — AB (ref 3.87–5.11)
RDW: 15.8 % — ABNORMAL HIGH (ref 11.5–15.5)
WBC: 10.2 10*3/uL (ref 4.0–10.5)

## 2016-12-22 LAB — BLOOD GAS, ARTERIAL
ACID-BASE EXCESS: 1.3 mmol/L (ref 0.0–2.0)
BICARBONATE: 25.1 mmol/L (ref 20.0–28.0)
Drawn by: 358491
O2 CONTENT: 2 L/min
O2 SAT: 95.6 %
PCO2 ART: 37.7 mmHg (ref 32.0–48.0)
PH ART: 7.438 (ref 7.350–7.450)
PO2 ART: 81.6 mmHg — AB (ref 83.0–108.0)
Patient temperature: 98.6

## 2016-12-22 LAB — LACTIC ACID, PLASMA: LACTIC ACID, VENOUS: 2 mmol/L — AB (ref 0.5–1.9)

## 2016-12-22 LAB — COMPREHENSIVE METABOLIC PANEL
ALT: 14 U/L (ref 14–54)
AST: 16 U/L (ref 15–41)
Albumin: 2.2 g/dL — ABNORMAL LOW (ref 3.5–5.0)
Alkaline Phosphatase: 95 U/L (ref 38–126)
Anion gap: 7 (ref 5–15)
BILIRUBIN TOTAL: 0.5 mg/dL (ref 0.3–1.2)
BUN: 18 mg/dL (ref 6–20)
CHLORIDE: 101 mmol/L (ref 101–111)
CO2: 24 mmol/L (ref 22–32)
CREATININE: 0.62 mg/dL (ref 0.44–1.00)
Calcium: 7.9 mg/dL — ABNORMAL LOW (ref 8.9–10.3)
Glucose, Bld: 191 mg/dL — ABNORMAL HIGH (ref 65–99)
POTASSIUM: 4.7 mmol/L (ref 3.5–5.1)
Sodium: 132 mmol/L — ABNORMAL LOW (ref 135–145)
TOTAL PROTEIN: 5.5 g/dL — AB (ref 6.5–8.1)

## 2016-12-22 LAB — GLUCOSE, CAPILLARY
GLUCOSE-CAPILLARY: 151 mg/dL — AB (ref 65–99)
Glucose-Capillary: 152 mg/dL — ABNORMAL HIGH (ref 65–99)
Glucose-Capillary: 171 mg/dL — ABNORMAL HIGH (ref 65–99)
Glucose-Capillary: 185 mg/dL — ABNORMAL HIGH (ref 65–99)

## 2016-12-22 LAB — TROPONIN I

## 2016-12-22 LAB — MAGNESIUM: Magnesium: 1.9 mg/dL (ref 1.7–2.4)

## 2016-12-22 MED ORDER — DILTIAZEM HCL 25 MG/5ML IV SOLN
15.0000 mg | Freq: Once | INTRAVENOUS | Status: DC
  Filled 2016-12-22: qty 5

## 2016-12-22 MED ORDER — FUROSEMIDE 10 MG/ML IJ SOLN
20.0000 mg | Freq: Once | INTRAMUSCULAR | Status: AC
  Administered 2016-12-22: 20 mg via INTRAVENOUS
  Filled 2016-12-22: qty 2

## 2016-12-22 MED ORDER — DILTIAZEM LOAD VIA INFUSION
15.0000 mg | Freq: Once | INTRAVENOUS | Status: AC
  Administered 2016-12-22: 15 mg via INTRAVENOUS

## 2016-12-22 MED ORDER — LEVALBUTEROL HCL 0.63 MG/3ML IN NEBU
0.6300 mg | INHALATION_SOLUTION | Freq: Three times a day (TID) | RESPIRATORY_TRACT | Status: DC
  Administered 2016-12-23 (×2): 0.63 mg via RESPIRATORY_TRACT
  Filled 2016-12-22 (×3): qty 3

## 2016-12-22 MED ORDER — CEFOTETAN DISODIUM-DEXTROSE 2-2.08 GM-%(50ML) IV SOLR: 2.0000 g | INTRAVENOUS | Status: AC

## 2016-12-22 MED ORDER — TRAVASOL 10 % IV SOLN
INTRAVENOUS | Status: AC
  Administered 2016-12-22: 17:00:00 via INTRAVENOUS
  Filled 2016-12-22: qty 1080

## 2016-12-22 MED ORDER — METOPROLOL TARTRATE 5 MG/5ML IV SOLN
5.0000 mg | Freq: Three times a day (TID) | INTRAVENOUS | Status: DC
  Administered 2016-12-22 – 2016-12-24 (×6): 5 mg via INTRAVENOUS
  Filled 2016-12-22 (×9): qty 5

## 2016-12-22 NOTE — Plan of Care (Cosign Needed)
  Bowel/Gastric: Will not experience complications related to bowel motility 12/22/2016 0708 - Progressing by Diquan Kassis A, RN Note Patient had bowel movement.    Education: Knowledge of [facility] information/materials will improve 12/22/2016 0708 - Not Progressing by Emmagene Ortner A, RN Note Patient had coughing fit and went into Afib RVR, requiring Metoprolol IV push x 2 and change to cardizem rate. See previous notes regarding EKG results and MD intervention.    Pain Managment: General experience of comfort will improve 12/22/2016 0708 - Not Progressing by Xin Klawitter A, RN Note Patient pain better controlled with increased morphine dose; however, she is still only getting relief for 2 hours at a time. Pt constantly moaning and having increased accessory muscle use with breathing. Also coughing constantly and very hard to the point of vomiting up clear fluid multiple times during shift.

## 2016-12-22 NOTE — Progress Notes (Signed)
Progress Note  Patient Name: Nicole [name] Date of Encounter: 12/22/2016  Primary Cardiologist: Hilty  Subjective   Increased work of breathing, increased heart rate, no chest pain  Inpatient Medications    Scheduled Meds: . [START ON 12/23/2016] cefoTEtan (CEFOTAN) IV  2 g Intravenous To OR  . furosemide  20 mg Intravenous Once  . insulin aspart  0-15 Units Subcutaneous Q6H  . levalbuterol  0.63 mg Inhalation Q6H  . mouth rinse  15 mL Mouth Rinse BID  . metoprolol tartrate  5 mg Intravenous Q8H  . ondansetron  4 mg Oral Q8H  . pantoprazole  40 mg Intravenous Q12H  . sodium chloride flush  10-40 mL Intracatheter Q12H   Continuous Infusions: . diltiazem (CARDIZEM) infusion 7.5 mg/hr (12/22/16 1230)  . famotidine (PEPCID) IV Stopped (12/22/16 1230)  . ondansetron (ZOFRAN) IV 8 mg (12/22/16 1348)  . TPN ADULT (ION) 75 mL/hr at 12/21/16 1806  . TPN ADULT (ION)     PRN Meds: acetaminophen **OR** acetaminophen, LORazepam, morphine injection, sodium chloride flush   Vital Signs    Vitals:   12/22/16 0806 12/22/16 0816 12/22/16 0904 12/22/16 1208  BP:  118/75  (!) 147/91  Pulse:  (!) 109 (!) 118 (!) 127  Resp:  (!) 23 (!) 30 (!) 29  Temp:  97.9 F (36.6 C) 99.7 F (37.6 C) 97.9 F (36.6 C)  TempSrc:  Oral Oral Axillary  SpO2: 92% 95% 90% 93%  Weight:      Height:        Intake/Output Summary (Last 24 hours) at 12/22/2016 1505 Last data filed at 12/22/2016 1357 Gross per 24 hour  Intake 2334.75 ml  Output 3040 ml  Net -705.25 ml   Filed Weights   12/20/16 0218 12/21/16 0445 12/22/16 0305  Weight: 206 lb 9.1 oz (93.7 kg) 208 lb 5.4 oz (94.5 kg) 201 lb 4.5 oz (91.3 kg)    Telemetry    Sinus tachycardia 120- Personally Reviewed  ECG    Sinus tachycardia with ST segment elevation in 3 and aVF- Personally Reviewed  Physical Exam   GEN: Ill-appearing, in bed in no acute distress, mildly increased respiratory effort HEENT: normal  Neck: no JVD, carotid  bruits, or masses Cardiac: tachy reg; no murmurs, rubs, or gallops,no edema  Respiratory: Wheeze noted bilaterally, increased work of breathing, poor air movement GI: soft, distended, + BS MS: no deformity or atrophy  Skin: warm and dry, no rash Neuro:  Alert and Oriented x 3, Strength and sensation are intact Psych: euthymic mood, full affect   Labs    Chemistry Recent Labs  Lab 12/16/16 1658  12/18/16 2328 12/19/16 0509 12/20/16 0405 12/21/16 0746 12/22/16 0502  NA 131*   < > 135 136 138 135 133*  K 3.2*   < > 3.7 3.7 3.7 3.9 4.0  CL 95*   < > 106 105 107 103 100*  CO2 24   < > 21* 22 24 25 24   GLUCOSE 127*   < > 136* 155* 129* 139* 144*  BUN 18   < > 10 9 10 12 17   CREATININE 0.74   < > 0.58 0.51 0.51 0.51 0.57  CALCIUM 8.2*   < > 7.7* 7.9* 7.9* 8.0* 8.1*  PROT 6.0*  --  5.3* 5.9*  --   --   --   ALBUMIN 2.4*  --  2.1* 2.2*  --   --   --   AST 16  --  14*  14*  --   --   --   ALT 13*  --  12* 12*  --   --   --   ALKPHOS 94  --  86 87  --   --   --   BILITOT 1.0  --  0.7 0.6  --   --   --   GFRNONAA >60   < > >60 >60 >60 >60 >60  GFRAA >60   < > >60 >60 >60 >60 >60  ANIONGAP 12   < > 8 9 7 7 9    < > = values in this interval not displayed.     Hematology Recent Labs  Lab 12/19/16 0509 12/21/16 0746 12/22/16 0502  WBC 7.8 9.7 10.2  RBC 3.49* 3.33* 3.48*  HGB 9.3* 8.9* 9.5*  HCT 30.3* 28.6* 30.1*  MCV 86.8 85.9 86.5  MCH 26.6 26.7 27.3  MCHC 30.7 31.1 31.6  RDW 15.4 15.4 15.8*  PLT 365 330 324    Cardiac Enzymes Recent Labs  Lab 12/17/16 0854 12/21/16 2336 12/22/16 0502  TROPONINI <0.03 <0.03 <0.03    Recent Labs  Lab 12/17/16 0907  TROPIPOC 0.00     BNP Recent Labs  Lab 12/17/16 0854  BNP 83.7     DDimer No results for input(s): DDIMER in the last 168 hours.   Radiology    Dg Chest Port 1 View  Result Date: 12/22/2016 CLINICAL DATA:  Shortness of breath and cough. EXAM: PORTABLE CHEST 1 VIEW COMPARISON:  12/17/2016 FINDINGS:  Right-sided PICC line with tip in the right atrium. Lungs are hypoinflated with bibasilar opacification left-greater-than-right with possible slight interval progression. Findings may be due to bibasilar atelectasis and left-sided effusion, although infection is possible. Stable cardiomegaly. Known large hiatal hernia. Remainder of the exam is unchanged. IMPRESSION: Worsening bibasilar opacification left-greater-than-right likely bibasilar atelectasis and left effusion. Basilar infection is possible. Right-sided PICC line with tip over the right atrium. This could be pulled back 5-6 cm. Mild stable cardiomegaly. These results will be called to the ordering clinician or representative by the Radiologist Assistant, and communication documented in the PACS or zVision Dashboard. Electronically Signed   By: Marin Olp M.D.   On: 12/22/2016 14:19   Dg Abd Portable 1v  Result Date: 12/22/2016 CLINICAL DATA:  Vomiting and periumbilical pain. EXAM: PORTABLE ABDOMEN - 1 VIEW COMPARISON:  CT of the abdomen and pelvis 12/17/2016 FINDINGS: There is paucity of bowel gas, limiting evaluation of the caliber of bowel loops. Visualized bowel gas pattern is nonobstructed. Contrast remains in the cecum and scattered colonic diverticula. There are degenerative changes in both hips. IMPRESSION: Nonobstructive bowel gas pattern. Residual contrast following recent CT exam. Electronically Signed   By: Nolon Nations M.D.   On: 12/22/2016 14:20    Cardiac Studies   ECHO -EF 55%, mod LAE  Patient Profile     66 y.o. female with PAF, gastric mass, abnormal EKG with ST segment elevation in the 3, aVF leads, asymptomatic/no chest pain  Assessment & Plan     Abnormal EKG/increased respiratory effort -No evidence of clinical myocardial infarction such as anginal symptoms/chest pain and troponin has been drawn up to 6 hours after initial EKG demonstrating ST segment elevation in 3 and aVF and were completely normal.  No  elevation in troponin would not be consistent with inferior infarct/artery occlusion.  It is possible that her ST segment changes are a result of her gastric mass/inflammation that may be translating to her inferior pericardial  wall perhaps.  Once again, she is not having any chest pain.  She does however have what appears to be increased work of breathing with wheezing.  Lasix 20 mg was administered yesterday with a 3 L output.  My recommendation is to continue with another dose of Lasix today and to check a blood gas which is currently ordered.  Chest x-ray was personally reviewed.  I think would be reasonable to try to increase the diltiazem IV drip perhaps from 7.5-10 mg/h up to as high as 15 mg/h.  My worry is that her sinus tachycardia is currently compensatory for her underlying inflammatory condition/increased respiratory effort.  Recent echocardiogram showed normal ejection fraction.  Based upon her current clinical condition, increased respiratory effort, she is certainly at increased risk for surgery from a pulmonary perspective.   PAF  -Currently in sinus tachycardia.  Gastric mass  - per GI team, plan is to perform gastrectomy/possible thoracotomy  Discussed at length with nursing as well as Dr. Posey Pronto.  For questions or updates, please contact [place] Please consult [email or web address] for contact info under Cardiology/STEMI.      Signed, Candee Furbish, MD  12/22/2016, 3:05 PM

## 2016-12-22 NOTE — Progress Notes (Signed)
Patient HR remains elevated near 120 range- Increased Cardizem gtt rate to 7.5/hr. HR reduced to closer to 100. Continuing to monitor patient. No evidence of blood pressure abnormality.   [name], RN

## 2016-12-22 NOTE — Progress Notes (Signed)
Dr. Posey Pronto by and discussed pt's decline over last 24 hrs.  Informed him that pt looked much worse today and was weak.  Was also requiring O2 and could not go without it without desats.  In to see and assess pt.  Lasix given per orders.  Also ordered CXR, EKG, Called cardiology, ABG.  Will continue to monitor.  Sons at bedside and updated by MD.

## 2016-12-22 NOTE — Progress Notes (Signed)
RN called regarding pt with HR 130-150's after having a coughing spell. Obtained an EKG and results showed Afib RVR with possible ST elevation and MI. RN had already paged MD. I asked her to repeat EKG and I was coming to bedside. MD gave new orders for 5 mg Metoprolol IVP every 5 mins up to three doses.  New order given for STAT Troponin result 0.03. Pt asymptomatic, denies CP or SOB.  MD consulted with Cardiology, they advised RN to turn cardizem gtt back on and continue to monitor as long as pt remains asymptomatic.

## 2016-12-22 NOTE — Progress Notes (Signed)
[facility] Progress Note  4 Days Post-Op  Subjective: CC:  Anxious this morning and requesting anxiety medication. Pain controlled. Still with intermittent nausea. Reports she and her family decided to remain at [facility] and proceed with surgery tomorrow.   HR 112-120 bpm during my exam.   Objective: Vital signs in last 24 hours: Temp:  [97.5 F (36.4 C)-99.7 F (37.6 C)] 99.7 F (37.6 C) (11/08 0904) Pulse Rate:  [55-135] 118 (11/08 0904) Resp:  [16-35] 30 (11/08 0904) BP: (106-154)/(61-100) 118/75 (11/08 0816) SpO2:  [90 %-100 %] 90 % (11/08 0904) Weight:  [91.3 kg (201 lb 4.5 oz)] 91.3 kg (201 lb 4.5 oz) (11/08 0305) Last BM Date: 12/22/16  Intake/Output from previous day: 11/07 0701 - 11/08 0700 In: 2334.8 [I.V.:2072.8; IV Piggyback:262] Out: 3890 [Urine:3890] Intake/Output this shift: No intake/output data recorded.  PE: Gen:  Alert, NAD, pleasant Card:  Regular rate and rhythm, pedal pulses 2+ BL Pulm:  Normal effort, coarse upper airway sounds, lungs CTAB with diminished breath sounds in bilaterally bases. Abd: Soft, mild TTP upper abdomen, bowel sounds present Skin: warm and dry, no rashes  Psych: A&Ox3   Lab Results:  Recent Labs    12/21/16 0746 12/22/16 0502  WBC 9.7 10.2  HGB 8.9* 9.5*  HCT 28.6* 30.1*  PLT 330 324   BMET Recent Labs    12/21/16 0746 12/22/16 0502  NA 135 133*  K 3.9 4.0  CL 103 100*  CO2 25 24  GLUCOSE 139* 144*  BUN 12 17  CREATININE 0.51 0.57  CALCIUM 8.0* 8.1*   CMP     Component Value Date/Time   NA 133 (L) 12/22/2016 0502   K 4.0 12/22/2016 0502   CL 100 (L) 12/22/2016 0502   CO2 24 12/22/2016 0502   GLUCOSE 144 (H) 12/22/2016 0502   BUN 17 12/22/2016 0502   CREATININE 0.57 12/22/2016 0502   CALCIUM 8.1 (L) 12/22/2016 0502   PROT 5.9 (L) 12/19/2016 0509   ALBUMIN 2.2 (L) 12/19/2016 0509   AST 14 (L) 12/19/2016 0509   ALT 12 (L) 12/19/2016 0509   ALKPHOS 87 12/19/2016 0509   BILITOT 0.6  12/19/2016 0509   GFRNONAA >60 12/22/2016 0502   GFRAA >60 12/22/2016 0502   Lipase     Component Value Date/Time   LIPASE 129 (H) 12/16/2016 1658   Studies/Results: No results found.  Anti-infectives: Anti-infectives (From admission, onward)   Start     Dose/Rate Route Frequency Ordered Stop   12/17/16 0515  piperacillin-tazobactam (ZOSYN) IVPB 3.375 g     3.375 g 100 mL/hr over 30 Minutes Intravenous  Once 12/17/16 0503 12/17/16 0636     Assessment/Plan Nausea, vomiting, hematemesis Large hiatal hernia Right renal mass Anemia- stable- hgb 9.5, hct 30.1, platelets 324  afib w/ RVR Protein calorie malnutrition Hypokalemia Alcohol use versus dependence  LargeGastricbodymasscausing gastric outlet obstruction - s/p endoscopy and BX 11/4 Dr. Michail Sermon, follow path - CT chest 11/5 shows 3.5 cm thyroid mass and bilateral pleural effusions, no pleural nodules/masses noted. - surgical path inconclusive. Recommend OR tomorrow for laparotomy, partial vs total gastrectomy, possible thoracotomy. Appreciate medicine working on rate control today with cardizem gtt.  - continue NPO w/ ice chips as tolerated.  FEN: NPO, ice chips, PICC/TNA ID: none  VTE: SCD's     LOS: 5 days    Jill Alexanders , [facility] 12/22/2016, 10:06 AM Pager: [phone] Consults: [phone] Mon-Fri 7:00 am-4:30 pm Sat-Sun 7:00 am-11:30 am

## 2016-12-22 NOTE — Care Management Note (Signed)
Case Management Note  Patient Details  Name: Nicole [name] MRN: [id number] Date of Birth: 1951-02-14  Subjective/Objective:   From home , presents with Large Gastric Mass, afib with RVR, hiatal hernia,  EGD showed no malignancy on bx, general and cardiothoracic surgery consulted, scheduled for surgery on Friday for Thoracotomy.  Conts on cardizem drip.                 Action/Plan: NCM will follow for dc needs.   Expected Discharge Date:  12/20/16               Expected Discharge Plan:     In-House Referral:     Discharge planning Services  CM Consult  Post Acute Care Choice:    Choice offered to:     DME Arranged:    DME Agency:     HH Arranged:    HH Agency:     Status of Service:  In process, will continue to follow  If discussed at Long Length of Stay Meetings, dates discussed:    Additional Comments:  Zenon Mayo, RN 12/22/2016, 5:42 PM

## 2016-12-22 NOTE — Progress Notes (Addendum)
[facility] Progress Note  Patient: Nicole [name] [id number]   PCP: Patient, No Pcp Per DOB: 05-01-1950   DOA: 12/16/2016   DOS: 12/22/2016   Date of Service: the patient was seen and examined on 12/22/2016  Subjective: Patient become more tired and lethargic.  No nausea no vomiting.  Still has abdominal pain.  No chest pain no chest tightness.  Appears in respiratory distress.  Voice is more muffled today.  Brief hospital course: Pt. with PMH of arthritis, HTN, GERD, alcohol abuse; admitted on 12/16/2016, presented with complaint of vomiting and hematemesis as well as abdominal pain, was found to have gastric outlet obstruction from hiatal hernia as well as large gastric ulcer and mass.  Underwent EGD with biopsy, no malignancy seen on the biopsy results.  General surgery as well as cardiothoracic surgery were consulted. Currently further plan is scheduled for surgery on Friday.  Stabilize overnight  Assessment and Plan: 1.  Large hiatal hernia. Large gastric mass GI consulted, S/P EGD.  Mass was large enough to hinder advancement of the scope to the duodenum. Biopsies inconclusive. General surgery consulted, patient remains n.p.o. for now started on TPN. Cardiothoracic surgery consulted as well due to presence of the large hiatal hernia and possible need for thoracotomy. At present plan is to proceed with surgical treatment for the large gastric mass as well as hiatal hernia tentatively scheduled on Friday. Continue as needed Zofran.  Continue pain medication, continue TPN.  2.  A. fib with RVR. Now sinus tachycardia. Started on Cardizem, continue Cardizem infusion right now.  Can increase the dose up to 15 mg and use as needed Lopressor as well. CHADSVASC score 3.  Currently holding anticoagulation as a need for surgery, has a gastric mass and presented with hematemesis. Continue to monitor on telemetry Echocardiogram shows 22-02% EF, diastolic dysfunction  3.  Renal  cyst. Patient will need nonurgent renal MRI most likely as an outpatient once stable with probable active issue.  4.  Anemia.  Anemia of chronic GI blood loss. Likely chronic GI bleed from the mass. FOBT positive. Transfuse for hemoglobin less than 7, for surgery transfuse to hemoglobin less than 8.  5.  Anxiety. Continue as needed Ativan.  6.  Hypokalemia. Replaced and corrected.  7.  History of alcohol abuse. In the hospital since over a week. No evidence of DT or withdrawal for now. Continue as needed Ativan. Alcohol cessation recommended.  8.  Prediabetes. Hemoglobin A1c 5.9. Continue insulin for coverage right now.  9.  3.5 cm left thyroid gland mass. Patient will require an ultrasound and biopsy most likely as an outpatient. TSH 1.42.  Within normal range.  10.  Abnormal EKG. Patient's EKG made a comment about ST elevation. Discussed with cardiology at bedside, appears to be more irritation-inflammation from hiatal hernia on the inferior wall causing EKG changes in lead III and aVF. Patient did have these changes even yesterday and troponin x2 following that were negative for any elevation. Present continue rate control medication no further change recommended.  11.  Acute respiratory distress. Acute on chronic diastolic dysfunction. Patient responded well to IV Lasix 20 mg x1 yesterday. We will also provide 1 more dose today. suction catheter.  12.  Respiratory distress most likely driven by large hiatal hernia.  ABG shows no evidence of hypercarbia although the patient's work of breathing may get worse overnight, recommend close monitoring with e link overnight.  Diet: NPO DVT Prophylaxis: mechanical compression device  Advance goals of care discussion:  full code  Family Communication: family was present at bedside, at the time of interview. The pt provided permission to discuss medical plan with the family. Opportunity was given to ask question and  all questions were answered satisfactorily.   Disposition:  Discharge to be determined.   Consultants: cardiothoracic, cardiology, gastroenterology, General surgery  Procedures: EGD  Antibiotics: Anti-infectives (From admission, onward)   Start     Dose/Rate Route Frequency Ordered Stop   12/23/16 0600  cefoTEtan in Dextrose 5% (CEFOTAN) IVPB 2 g     2 g Intravenous To Surgery 12/22/16 1401 12/24/16 0600   12/17/16 0515  piperacillin-tazobactam (ZOSYN) IVPB 3.375 g     3.375 g 100 mL/hr over 30 Minutes Intravenous  Once 12/17/16 0503 12/17/16 0636       Objective: Physical Exam: Vitals:   12/22/16 1300 12/22/16 1400 12/22/16 1500 12/22/16 1600  BP: (!) 149/93 (!) 124/104 (!) 146/86 (!) 115/96  Pulse: (!) 131 (!) 127 (!) 122 (!) 110  Resp: (!) 25 (!) 22 (!) 27 (!) 24  Temp:   97.8 F (36.6 C)   TempSrc:   Oral   SpO2: 95% 98% 97% 99%  Weight:      Height:        Intake/Output Summary (Last 24 hours) at 12/22/2016 1721 Last data filed at 12/22/2016 1653 Gross per 24 hour  Intake 2334.75 ml  Output 3340 ml  Net -1005.25 ml   Filed Weights   12/20/16 0218 12/21/16 0445 12/22/16 0305  Weight: 93.7 kg (206 lb 9.1 oz) 94.5 kg (208 lb 5.4 oz) 91.3 kg (201 lb 4.5 oz)   General: Alert, Awake and Oriented to Time, Place and Person. Appear in moderate distress, affect appropriate Eyes: PERRL, Conjunctiva normal ENT: Oral Mucosa clear moist. Neck: difficult to assess JVD, no Abnormal Mass Or lumps Cardiovascular: S1 and S2 Present, no Murmur, Peripheral Pulses Present Respiratory: increased respiratory effort, Bilateral Air entry equal and Decreased, no use of accessory muscle, basal Crackles, upper airway wheezes resolved Abdomen: Bowel Sound absent, Soft and mild tenderness, no hernia Skin: no redness, no Rash, no induration Extremities: trace Pedal edema, no calf tenderness Neurologic: Grossly no focal neuro deficit. Bilaterally Equal motor strength  Data  Reviewed: CBC: Recent Labs  Lab 12/16/16 1658 12/18/16 0527 12/19/16 0509 12/21/16 0746 12/22/16 0502  WBC 12.7* 8.9 7.8 9.7 10.2  NEUTROABS  --   --  6.5  --   --   HGB 10.2* 9.0* 9.3* 8.9* 9.5*  HCT 31.8* 29.5* 30.3* 28.6* 30.1*  MCV 85.0 87.0 86.8 85.9 86.5  PLT 427* 397 365 330 601   Basic Metabolic Panel: Recent Labs  Lab 12/18/16 1124  12/19/16 0509 12/20/16 0405 12/21/16 0746 12/22/16 0502 12/22/16 1415  NA 136   < > 136 138 135 133* 132*  K 3.0*   < > 3.7 3.7 3.9 4.0 4.7  CL 103   < > 105 107 103 100* 101  CO2 22   < > 22 24 25 24 24   GLUCOSE 98   < > 155* 129* 139* 144* 191*  BUN 11   < > 9 10 12 17 18   CREATININE 0.65   < > 0.51 0.51 0.51 0.57 0.62  CALCIUM 7.7*   < > 7.9* 7.9* 8.0* 8.1* 7.9*  MG 1.7  --  1.7 1.7 1.8 1.9  --   PHOS 2.3*  --  1.9* 2.9  --  4.3  --    < > =  values in this interval not displayed.    Liver Function Tests: Recent Labs  Lab 12/16/16 1658 12/18/16 2328 12/19/16 0509 12/22/16 1415  AST 16 14* 14* 16  ALT 13* 12* 12* 14  ALKPHOS 94 86 87 95  BILITOT 1.0 0.7 0.6 0.5  PROT 6.0* 5.3* 5.9* 5.5*  ALBUMIN 2.4* 2.1* 2.2* 2.2*   Recent Labs  Lab 12/16/16 1658  LIPASE 129*   No results for input(s): AMMONIA in the last 168 hours. Coagulation Profile: No results for input(s): INR, PROTIME in the last 168 hours. Cardiac Enzymes: Recent Labs  Lab 12/17/16 0854 12/21/16 2336 12/22/16 0502  TROPONINI <0.03 <0.03 <0.03   BNP (last 3 results) No results for input(s): PROBNP in the last 8760 hours. CBG: Recent Labs  Lab 12/21/16 1221 12/21/16 1753 12/21/16 2354 12/22/16 0604 12/22/16 1211  GLUCAP 164* 164* 169* 152* 151*   Studies: Dg Chest Port 1 View  Result Date: 12/22/2016 CLINICAL DATA:  Shortness of breath and cough. EXAM: PORTABLE CHEST 1 VIEW COMPARISON:  12/17/2016 FINDINGS: Right-sided PICC line with tip in the right atrium. Lungs are hypoinflated with bibasilar opacification left-greater-than-right with  possible slight interval progression. Findings may be due to bibasilar atelectasis and left-sided effusion, although infection is possible. Stable cardiomegaly. Known large hiatal hernia. Remainder of the exam is unchanged. IMPRESSION: Worsening bibasilar opacification left-greater-than-right likely bibasilar atelectasis and left effusion. Basilar infection is possible. Right-sided PICC line with tip over the right atrium. This could be pulled back 5-6 cm. Mild stable cardiomegaly. These results will be called to the ordering clinician or representative by the Radiologist Assistant, and communication documented in the PACS or zVision Dashboard. Electronically Signed   By: Marin Olp M.D.   On: 12/22/2016 14:19   Dg Abd Portable 1v  Result Date: 12/22/2016 CLINICAL DATA:  Vomiting and periumbilical pain. EXAM: PORTABLE ABDOMEN - 1 VIEW COMPARISON:  CT of the abdomen and pelvis 12/17/2016 FINDINGS: There is paucity of bowel gas, limiting evaluation of the caliber of bowel loops. Visualized bowel gas pattern is nonobstructed. Contrast remains in the cecum and scattered colonic diverticula. There are degenerative changes in both hips. IMPRESSION: Nonobstructive bowel gas pattern. Residual contrast following recent CT exam. Electronically Signed   By: Nolon Nations M.D.   On: 12/22/2016 14:20    Scheduled Meds: . [START ON 12/23/2016] cefoTEtan (CEFOTAN) IV  2 g Intravenous To OR  . insulin aspart  0-15 Units Subcutaneous Q6H  . levalbuterol  0.63 mg Inhalation Q6H  . mouth rinse  15 mL Mouth Rinse BID  . metoprolol tartrate  5 mg Intravenous Q8H  . ondansetron  4 mg Oral Q8H  . pantoprazole  40 mg Intravenous Q12H  . sodium chloride flush  10-40 mL Intracatheter Q12H   Continuous Infusions: . diltiazem (CARDIZEM) infusion 10 mg/hr (12/22/16 1453)  . famotidine (PEPCID) IV Stopped (12/22/16 1230)  . ondansetron (ZOFRAN) IV Stopped (12/22/16 1405)  . TPN ADULT (ION) 75 mL/hr at 12/21/16 1806  .  TPN ADULT (ION)     PRN Meds: acetaminophen **OR** acetaminophen, LORazepam, morphine injection, sodium chloride flush  Time spent: The patient is critically ill with multiple organ systems failure and requires high complexity decision making for assessment and support, frequent evaluation and titration of therapies. Critical Care Time devoted to patient care services described in this note is 45 minutes, discussed with Dr. Marlou Porch, general surgery personally, CCM  Author: Berle Mull, MD [facility] Pager: [phone] 12/22/2016 5:21 PM  If 7PM-7AM,  please contact night-coverage at [email or web address], password [facility]

## 2016-12-22 NOTE — Progress Notes (Signed)
Cardizem bolus ordered by MD and given per orders.

## 2016-12-22 NOTE — Progress Notes (Addendum)
2155 Patient heard having a coughing fit - went to assess patient and found her HR to be elevated. Patient tearful-requesting pain medication and anxiety. Went to get medication and returned to find patient HR in Afib RVR 130-150.   2158 Patient given Morphine and Ativan as ordered/requested. HR remains in Afib. Plan to increase Cardizem gtt and get EKG prior to calling MD.   2226 EKG performed and results show Afib RVR with possible ST elevation and MI. Patient now sleeping after pain medication/anxiety medicine. When asked if symptomatic- she denies chest pain. MD paged. Rapid Response consulted for backup with patient.  Repeat EKG shows same rhythm. Spoke with Dr. Myna Hidalgo with [facility] night coverage. Given orders to give patient Metoprolol 5mg  IV push every 5 minutes- up to three times.   2236 Rapid Reponse at bedside. First dose Metoprolol 5mg  IV given- HR drops from 140's to 115. Blood pressure remains stable.   2305 HR remains elevated- per parameters given second dose of Metoprolol 5mg  IV push- HR came down to 100. BP stable.   2315 Repeat call with Dr. Myna Hidalgo regarding patient's condition. Per MD- spoke with cardiology and advises that we turn the cardizem gtt back on and that as long as patient remains asymptomatic- without chest pain- we will continue the same.    2355 Notified by CCMD that patient's HR converted to ST.    [name], RN

## 2016-12-22 NOTE — Progress Notes (Signed)
Discussed case with cards and Dr. Posey Pronto.  Metoprolol given per orders.  Diltiazem gtt increased to 10 at 1453 today.

## 2016-12-22 NOTE — Care Management Important Message (Signed)
Important Message  Patient Details  Name: Nicole [name] MRN: [id number] Date of Birth: 12-07-1950   Medicare Important Message Given:  Yes    Nathen May 12/22/2016, 12:52 PM

## 2016-12-22 NOTE — Progress Notes (Signed)
PHARMACY - ADULT TOTAL PARENTERAL NUTRITION CONSULT NOTE   Pharmacy Consult:  TPN Indication:  Prolonged NPO status / GOO  Patient Measurements: Height: 5\' 8"  (172.7 cm) Weight: 201 lb 4.5 oz (91.3 kg) IBW/kg (Calculated) : 63.9 TPN AdjBW (KG): 74.4 Body mass index is 30.6 kg/m.  Assessment:  Nicole [name] presented on 11/Nicole/18 with N/V and essentially no PO intake x4 days.  CT shows gastric mass and EGD concerning for malignant gastric tumor.  Per Surgery, if GIST then consider Gleevec to shrink tumor and will need J-tube for EN; if adenocarcinoma then will need additional work-up.  While awaiting biopsy result to determine care, Pharmacy consulted to initiate TPN for nutritional support.  Noted patient has a history of EtOH use.  GI: Hx GERD, presented with hematemesis on Protonix + Pepcid. New large gastric body mass causing GOO and hiatal hernia. OR scheduled for 11/9 for possible resection, thoracotomy. Continues to report abdominal pain and persistent nausea / emesis.  LBM 11/8, +flatus.  Scheduled Zofran Endo: thyroid gland mass on CT.  No hx DM - CBGs 140-160s Insulin requirements in the past 24 hours: 12 units mSSI Lytes: Na down 133, Cl down to 100, all others WNL Renal: new renal mass. SCr stable, BUN WNL - UOP up 1.8 ml/kg/hr, fluids d/c'd. Net +4.6L since admit. S/p lasix x 1 on 11/7 Pulm: small pleural effusion on CT. RA. xopenex Cards: HTN - Afib with RVR on admit, no AC d/t hematemesis - BP ok, ST 110-120s, diltiazem gtt, lopressor IV prn Hepatobil: LFTs / tbili / TG WNL Neuro: EtOH - thiamine, folate, multivitamin in TPN.  PRN morphine/Ativan ID: afebrile, WBC WNL - not on abx Best Practices: SCDs TPN Access: PICC placed 12/18/16 TPN start date: 12/18/16  Nutritional Goals (RD rec on 11/5): Kcal: 2000-2200  Protein: 100-115 g per day Fluid: [email or web address].[email or web address] L  Current Nutrition:  TPN   Plan:  Continue TPN at 75 ml/hr, providing 108g AA, 324 g CHO, and 47 g of lipids, which adds  up to 2001 kCal and meeting 100% of patient needs Lytes in TPN: no change today, Cl/Acetate ratio 1:1 Daily multivitamin and trace elements in TPN Add thiamine 100mg  and folic acid 1mg  to TPN Continue moderate SSI Q6H + increase to 20 units regular insulin in TPN F/U iron supplementation when possible F/U AM labs   [name], PharmD, BCPS Clinical Pharmacist 12/22/2016 9:20 AM

## 2016-12-23 ENCOUNTER — Encounter (HOSPITAL_COMMUNITY)
Admission: EM | Disposition: A | Discharge status: Other Acute Inpatient Hospital | Payer: Self-pay | Source: Home / Self Care | Attending: Pulmonary Disease

## 2016-12-23 ENCOUNTER — Encounter (HOSPITAL_COMMUNITY): Payer: Self-pay | Admitting: Anesthesiology

## 2016-12-23 ENCOUNTER — Inpatient Hospital Stay (HOSPITAL_COMMUNITY): Payer: Medicare Other

## 2016-12-23 DIAGNOSIS — R9431 Abnormal electrocardiogram [ECG] [EKG]: Secondary | ICD-10-CM

## 2016-12-23 LAB — GLUCOSE, CAPILLARY
GLUCOSE-CAPILLARY: 239 mg/dL — AB (ref 65–99)
Glucose-Capillary: 191 mg/dL — ABNORMAL HIGH (ref 65–99)
Glucose-Capillary: 166 mg/dL — ABNORMAL HIGH (ref 65–99)

## 2016-12-23 LAB — POCT I-STAT 3, ART BLOOD GAS (G3+)
Acid-Base Excess: 5 mmol/L — ABNORMAL HIGH (ref 0.0–2.0)
BICARBONATE: 30.3 mmol/L — AB (ref 20.0–28.0)
O2 SAT: 100 %
PCO2 ART: 48.5 mmHg — AB (ref 32.0–48.0)
PH ART: 7.403 (ref 7.350–7.450)
PO2 ART: 352 mmHg — AB (ref 83.0–108.0)
Patient temperature: 98.2
TCO2: 32 mmol/L (ref 22–32)

## 2016-12-23 LAB — CBC
HEMATOCRIT: 35 % — AB (ref 36.0–46.0)
Hemoglobin: 10.9 g/dL — ABNORMAL LOW (ref 12.0–15.0)
MCH: 27 pg (ref 26.0–34.0)
MCHC: 31.1 g/dL (ref 30.0–36.0)
MCV: 86.6 fL (ref 78.0–100.0)
Platelets: 481 10*3/uL — ABNORMAL HIGH (ref 150–400)
RBC: 4.04 MIL/uL (ref 3.87–5.11)
RDW: 15.9 % — AB (ref 11.5–15.5)
WBC: 14.4 10*3/uL — ABNORMAL HIGH (ref 4.0–10.5)

## 2016-12-23 LAB — BASIC METABOLIC PANEL
Anion gap: 11 (ref 5–15)
BUN: 20 mg/dL (ref 6–20)
CHLORIDE: 100 mmol/L — AB (ref 101–111)
CO2: 23 mmol/L (ref 22–32)
Calcium: 8.2 mg/dL — ABNORMAL LOW (ref 8.9–10.3)
Creatinine, Ser: 0.65 mg/dL (ref 0.44–1.00)
GFR calc Af Amer: >60 mL/min (ref >60)
GLUCOSE: 151 mg/dL — AB (ref 65–99)
Potassium: 4.4 mmol/L (ref 3.5–5.1)
Sodium: 134 mmol/L — ABNORMAL LOW (ref 135–145)

## 2016-12-23 LAB — ECHOCARDIOGRAM LIMITED
HEIGHTINCHES: 68 in
WEIGHTICAEL: 3259.28 [oz_av]

## 2016-12-23 LAB — PROTIME-INR
INR: 1.2
PROTHROMBIN TIME: 15.1 s (ref 11.4–15.2)

## 2016-12-23 LAB — PHOSPHORUS: Phosphorus: 3.8 mg/dL (ref 2.5–4.6)

## 2016-12-23 LAB — APTT: aPTT: 31 seconds (ref 24–36)

## 2016-12-23 LAB — INFLUENZA PANEL BY PCR (TYPE A & B)
INFLAPCR: NEGATIVE
Influenza B By PCR: NEGATIVE

## 2016-12-23 LAB — TYPE AND SCREEN
ABO/RH(D): B POS
ANTIBODY SCREEN: NEGATIVE

## 2016-12-23 LAB — ABO/RH: ABO/RH(D): B POS

## 2016-12-23 LAB — HEMOGLOBIN A1C
Hgb A1c MFr Bld: 6.2 % — ABNORMAL HIGH (ref 4.8–5.6)
MEAN PLASMA GLUCOSE: 131.24 mg/dL

## 2016-12-23 LAB — MAGNESIUM: Magnesium: 1.9 mg/dL (ref 1.7–2.4)

## 2016-12-23 SURGERY — EXCISION, MASS, TORSO
Anesthesia: General

## 2016-12-23 MED ORDER — DIATRIZOATE MEGLUMINE & SODIUM 66-10 % PO SOLN
100.0000 mL | Freq: Once | ORAL | Status: AC
  Administered 2016-12-24: 100 mL

## 2016-12-23 MED ORDER — FENTANYL CITRATE (PF) 250 MCG/5ML IJ SOLN
INTRAMUSCULAR | Status: AC
  Filled 2016-12-23: qty 10

## 2016-12-23 MED ORDER — VANCOMYCIN HCL 10 G IV SOLR
1250.0000 mg | Freq: Two times a day (BID) | INTRAVENOUS | Status: DC
  Administered 2016-12-23 – 2016-12-26 (×7): 1250 mg via INTRAVENOUS
  Filled 2016-12-23 (×7): qty 1250

## 2016-12-23 MED ORDER — PROPOFOL 10 MG/ML IV BOLUS
INTRAVENOUS | Status: AC
  Filled 2016-12-23: qty 40

## 2016-12-23 MED ORDER — FUROSEMIDE 10 MG/ML IJ SOLN
40.0000 mg | Freq: Once | INTRAMUSCULAR | Status: AC
  Administered 2016-12-23: 40 mg via INTRAVENOUS
  Filled 2016-12-23: qty 4

## 2016-12-23 MED ORDER — DIPHENHYDRAMINE HCL 50 MG PO CAPS
50.0000 mg | ORAL_CAPSULE | Freq: Once | ORAL | Status: DC
  Filled 2016-12-23: qty 1

## 2016-12-23 MED ORDER — PREDNISONE 50 MG PO TABS
50.0000 mg | ORAL_TABLET | Freq: Four times a day (QID) | ORAL | Status: DC
  Filled 2016-12-23: qty 1

## 2016-12-23 MED ORDER — SODIUM CHLORIDE 0.9 % IV BOLUS (SEPSIS)
500.0000 mL | Freq: Once | INTRAVENOUS | Status: AC
  Administered 2016-12-23: 500 mL via INTRAVENOUS

## 2016-12-23 MED ORDER — FENTANYL 2500MCG IN NS 250ML (10MCG/ML) PREMIX INFUSION
25.0000 ug/h | INTRAVENOUS | Status: DC
  Administered 2016-12-23: 50 ug/h via INTRAVENOUS
  Administered 2016-12-24: 100 ug/h via INTRAVENOUS
  Administered 2016-12-25 – 2016-12-26 (×3): 200 ug/h via INTRAVENOUS
  Administered 2016-12-26: 150 ug/h via INTRAVENOUS
  Administered 2016-12-27: 300 ug/h via INTRAVENOUS
  Administered 2016-12-27: 100 ug/h via INTRAVENOUS
  Filled 2016-12-23 (×8): qty 250

## 2016-12-23 MED ORDER — LEVALBUTEROL HCL 0.63 MG/3ML IN NEBU
0.6300 mg | INHALATION_SOLUTION | Freq: Four times a day (QID) | RESPIRATORY_TRACT | Status: DC
  Administered 2016-12-23 – 2016-12-27 (×17): 0.63 mg via RESPIRATORY_TRACT
  Filled 2016-12-23 (×32): qty 3

## 2016-12-23 MED ORDER — MIDAZOLAM HCL 2 MG/2ML IJ SOLN
INTRAMUSCULAR | Status: AC
  Filled 2016-12-23: qty 2

## 2016-12-23 MED ORDER — DIATRIZOATE MEGLUMINE & SODIUM 66-10 % PO SOLN
ORAL | Status: AC
  Filled 2016-12-23: qty 120

## 2016-12-23 MED ORDER — DEXTROSE 5 % IV SOLN
5.0000 mg/h | INTRAVENOUS | Status: DC
  Filled 2016-12-23: qty 100

## 2016-12-23 MED ORDER — FENTANYL CITRATE (PF) 100 MCG/2ML IJ SOLN
INTRAMUSCULAR | Status: AC
  Administered 2016-12-23: 100 ug
  Filled 2016-12-23: qty 4

## 2016-12-23 MED ORDER — VANCOMYCIN HCL 10 G IV SOLR
1750.0000 mg | Freq: Once | INTRAVENOUS | Status: AC
  Administered 2016-12-23: 1750 mg via INTRAVENOUS
  Filled 2016-12-23: qty 1750

## 2016-12-23 MED ORDER — LACTATED RINGERS IV SOLN
INTRAVENOUS | Status: AC
  Administered 2016-12-23 – 2016-12-24 (×3): via INTRAVENOUS

## 2016-12-23 MED ORDER — DIPHENHYDRAMINE HCL 50 MG/ML IJ SOLN
50.0000 mg | Freq: Once | INTRAMUSCULAR | Status: AC
  Administered 2016-12-24: 50 mg via INTRAVENOUS
  Filled 2016-12-23: qty 1

## 2016-12-23 MED ORDER — AMIODARONE HCL IN DEXTROSE 360-4.14 MG/200ML-% IV SOLN
60.0000 mg/h | INTRAVENOUS | Status: AC
  Administered 2016-12-23 – 2016-12-24 (×2): 60 mg/h via INTRAVENOUS
  Filled 2016-12-23 (×2): qty 200

## 2016-12-23 MED ORDER — FENTANYL CITRATE (PF) 100 MCG/2ML IJ SOLN
INTRAMUSCULAR | Status: AC
  Filled 2016-12-23: qty 2

## 2016-12-23 MED ORDER — FENTANYL BOLUS VIA INFUSION
25.0000 ug | INTRAVENOUS | Status: DC | PRN
  Administered 2016-12-24 – 2016-12-27 (×9): 25 ug via INTRAVENOUS
  Filled 2016-12-23: qty 25

## 2016-12-23 MED ORDER — AMIODARONE HCL IN DEXTROSE 360-4.14 MG/200ML-% IV SOLN
30.0000 mg/h | INTRAVENOUS | Status: DC
  Administered 2016-12-24 – 2016-12-27 (×8): 30 mg/h via INTRAVENOUS
  Filled 2016-12-23 (×15): qty 200

## 2016-12-23 MED ORDER — MIDAZOLAM HCL 2 MG/2ML IJ SOLN: 1.0000 mg | INTRAMUSCULAR | Status: DC | PRN

## 2016-12-23 MED ORDER — ACETAMINOPHEN 160 MG/5ML PO SOLN
650.0000 mg | Freq: Four times a day (QID) | ORAL | Status: DC | PRN
  Administered 2016-12-23: 650 mg
  Filled 2016-12-23: qty 20.3

## 2016-12-23 MED ORDER — PIPERACILLIN-TAZOBACTAM 3.375 G IVPB
3.3750 g | Freq: Three times a day (TID) | INTRAVENOUS | Status: DC
  Administered 2016-12-23 – 2016-12-27 (×14): 3.375 g via INTRAVENOUS
  Filled 2016-12-23 (×17): qty 50

## 2016-12-23 MED ORDER — CHLORHEXIDINE GLUCONATE 0.12% ORAL RINSE (MEDLINE KIT)
15.0000 mL | Freq: Two times a day (BID) | OROMUCOSAL | Status: DC
  Administered 2016-12-23 – 2016-12-27 (×9): 15 mL via OROMUCOSAL

## 2016-12-23 MED ORDER — METHYLPREDNISOLONE SODIUM SUCC 40 MG IJ SOLR
40.0000 mg | Freq: Four times a day (QID) | INTRAMUSCULAR | Status: DC
Start: 2016-12-23 — End: 2016-12-25
  Administered 2016-12-23 – 2016-12-25 (×7): 40 mg via INTRAVENOUS
  Filled 2016-12-23 (×7): qty 1

## 2016-12-23 MED ORDER — ORAL CARE MOUTH RINSE
15.0000 mL | Freq: Four times a day (QID) | OROMUCOSAL | Status: DC
  Administered 2016-12-24 – 2016-12-27 (×15): 15 mL via OROMUCOSAL

## 2016-12-23 MED ORDER — SODIUM CHLORIDE 0.9 % IV SOLN
INTRAVENOUS | Status: DC
  Administered 2016-12-23: 20:00:00 via INTRAVENOUS

## 2016-12-23 MED ORDER — PIPERACILLIN-TAZOBACTAM 3.375 G IVPB 30 MIN
3.3750 g | Freq: Once | INTRAVENOUS | Status: AC
  Administered 2016-12-23: 3.375 g via INTRAVENOUS
  Filled 2016-12-23: qty 50

## 2016-12-23 MED ORDER — MIDAZOLAM HCL 2 MG/2ML IJ SOLN
INTRAMUSCULAR | Status: AC
Start: 2016-12-23 — End: 2016-12-23
  Administered 2016-12-23 (×2): 1 mg
  Filled 2016-12-23: qty 4

## 2016-12-23 MED ORDER — MIDAZOLAM HCL 2 MG/2ML IJ SOLN
1.0000 mg | INTRAMUSCULAR | Status: DC | PRN
  Administered 2016-12-23 – 2016-12-24 (×4): 2 mg via INTRAVENOUS
  Administered 2016-12-25 – 2016-12-26 (×4): 4 mg via INTRAVENOUS
  Administered 2016-12-27: 1 mg via INTRAVENOUS
  Administered 2016-12-27: 2 mg via INTRAVENOUS
  Administered 2016-12-27: 1 mg via INTRAVENOUS
  Administered 2016-12-27: 2 mg via INTRAVENOUS
  Filled 2016-12-23: qty 2
  Filled 2016-12-23: qty 4
  Filled 2016-12-23: qty 2
  Filled 2016-12-23 (×2): qty 4
  Filled 2016-12-23 (×5): qty 2
  Filled 2016-12-23: qty 4

## 2016-12-23 MED ORDER — AMIODARONE LOAD VIA INFUSION
150.0000 mg | Freq: Once | INTRAVENOUS | Status: AC
  Administered 2016-12-23: 150 mg via INTRAVENOUS
  Filled 2016-12-23: qty 83.34

## 2016-12-23 MED ORDER — FENTANYL CITRATE (PF) 100 MCG/2ML IJ SOLN
50.0000 ug | Freq: Once | INTRAMUSCULAR | Status: AC
Start: 2016-12-23 — End: 2016-12-23
  Administered 2016-12-23: 50 ug via INTRAVENOUS

## 2016-12-23 MED ORDER — DIATRIZOATE MEGLUMINE & SODIUM 66-10 % PO SOLN
100.0000 mL | Freq: Once | ORAL | Status: DC
  Filled 2016-12-23: qty 120

## 2016-12-23 MED ORDER — TRAVASOL 10 % IV SOLN
INTRAVENOUS | Status: AC
  Administered 2016-12-23: 17:00:00 via INTRAVENOUS
  Filled 2016-12-23: qty 1080

## 2016-12-23 NOTE — Progress Notes (Signed)
Sputum sample collected and sent to lab per MD order.

## 2016-12-23 NOTE — Progress Notes (Signed)
[facility] Progress Note  Patient: Nicole [name] [id number]   PCP: Patient, No Pcp Per DOB: 07/28/1950   DOA: 12/16/2016   DOS: 12/23/2016   Date of Service: the patient was seen and examined on 12/23/2016  Subjective: Patient continues to have respiratory distress as well as sinus tachycardia.  Denies having any chest pain.  No vomiting suspected overnight.  Continues to have abdominal pain.  Voice remains hoarse and muffled.  Has new gurgling noise as compared to yesterday.  Brief hospital course: Pt. with PMH of arthritis, HTN, GERD, alcohol abuse; admitted on 12/16/2016, presented with complaint of vomiting and hematemesis as well as abdominal pain, was found to have gastric outlet obstruction from hiatal hernia as well as large gastric ulcer and mass.  Underwent EGD with biopsy, no malignancy seen on the biopsy results.  General surgery as well as cardiothoracic surgery were consulted. Currently further plan is stabilized resp distress  Assessment and Plan: 1.  Large hiatal hernia. Large gastric mass GI consulted, S/P EGD.  Mass was large enough to hinder advancement of the scope to the duodenum. Biopsies inconclusive. General surgery consulted, patient remains n.p.o. for now started on TPN. Cardiothoracic surgery consulted as well due to presence of the large hiatal hernia and possible need for thoracotomy. Surgery is on hold until the pt is optimized.  Continue as needed Zofran.  Continue pain medication, continue TPN. Will discuss with surgery.  2.  A. fib with RVR. Now sinus tachycardia. On Cardizem infusion, rated maxed out. Also on Lopressor schedule will increase the frequency. CHADSVASC score 3.  Currently holding anticoagulation as a need for surgery, has a gastric mass and presented with hematemesis. Continue to monitor on telemetry Echocardiogram shows 30-16% EF, diastolic dysfunction Continue scheduled Lopressor.  3. Acute respiratory distress. Acute on  chronic diastolic dysfunction. Patient started having respiratory distress 2 days ago.  On 12/21/2016.  Upper airway wheezing was suspected, given Xopenex with resolution of the wheezing.  Also was given IV Lasix with improvement in ins and out. Respiratory distress remained and started having sinus tachycardia.  Chest x-ray was performed no acute abnormality seen commented on basal atelectasis. CCM consulted for further input. Pt transferred to ICU, intubated for airway protection and distress  at present due to distress will give her empiric coverage for H CAP with vancomycin and Zosyn. Performed blood cultures. Check influenza PCR. Continue Xopenex. Steroids would be beneficial although patient has been for major abdominal surgery and therefore steroids can complicate wound healing.  4.  Respiratory distress most likely driven by large hiatal hernia.  ABG shows no evidence of hypercarbia although the patient's work of breathing may get worse overnight, recommend close monitoring with e link overnight. Renal cyst. Patient will need nonurgent renal MRI most likely as an outpatient once stable with probable active issue.  5. Abnormal EKG. Patient's EKG made a comment about ST elevation. Discussed with cardiology at bedside, appears to be more irritation-inflammation from hiatal hernia on the inferior wall causing EKG changes in lead III and aVF. Patient did have these changes even yesterday and troponin x2 following that were negative for any elevation. Present continue rate control medication no further change recommended. Limited Echocardiogram   6. Anemia.  Anemia of chronic GI blood loss. Likely chronic GI bleed from the mass. FOBT positive.  Hemoglobin relatively stable. Transfuse for hemoglobin less than 7, for surgery transfuse to hemoglobin less than 8.  7..  Anxiety. Continue as needed Ativan.  8.  Hypokalemia.  Replaced and corrected.  9.  History of alcohol abuse. In the  hospital since over a week. No evidence of DT or withdrawal for now. Continue as needed Ativan. Alcohol cessation recommended.  10.  Prediabetes. Hemoglobin A1c 5.9. Continue insulin for coverage right now.  11.  3.5 cm left thyroid gland mass. Patient will require an ultrasound and biopsy most likely as an outpatient. TSH 1.42.  Within normal range.  Diet: NPO DVT Prophylaxis: mechanical compression device  Advance goals of care discussion: full code  Family Communication: family was present at bedside, at the time of interview.   Disposition:  Discharge to be determined.   Consultants: cardiothoracic, cardiology, gastroenterology, General surgery, CCM Procedures: EGD  Antibiotics: Anti-infectives (From admission, onward)   Start     Dose/Rate Route Frequency Ordered Stop   12/23/16 2200  vancomycin (VANCOCIN) 1,250 mg in sodium chloride 0.9 % 250 mL IVPB     1,250 mg 166.7 mL/hr over 90 Minutes Intravenous Every 12 hours 12/23/16 0840     12/23/16 1500  piperacillin-tazobactam (ZOSYN) IVPB 3.375 g     3.375 g 12.5 mL/hr over 240 Minutes Intravenous Every 8 hours 12/23/16 0840     12/23/16 0900  vancomycin (VANCOCIN) 1,750 mg in sodium chloride 0.9 % 500 mL IVPB     1,750 mg 250 mL/hr over 120 Minutes Intravenous  Once 12/23/16 0836     12/23/16 0900  piperacillin-tazobactam (ZOSYN) IVPB 3.375 g     3.375 g 100 mL/hr over 30 Minutes Intravenous  Once 12/23/16 0836 12/23/16 1113   12/23/16 0600  cefoTEtan in Dextrose 5% (CEFOTAN) IVPB 2 g     2 g Intravenous To Surgery 12/22/16 1401 12/24/16 0600   12/17/16 0515  piperacillin-tazobactam (ZOSYN) IVPB 3.375 g     3.375 g 100 mL/hr over 30 Minutes Intravenous  Once 12/17/16 0503 12/17/16 0636       Objective: Physical Exam: Vitals:   12/23/16 1030 12/23/16 1100 12/23/16 1136 12/23/16 1203  BP: (!) 116/57 98/82 (!) 78/30   Pulse: (!) 111 (!) 123 (!) 124   Resp: (!) 22 19 (!) 21   Temp:    99.8 F (37.7 C)    TempSrc:    Oral  SpO2: 100% 99% 100%   Weight:      Height:        Intake/Output Summary (Last 24 hours) at 12/23/2016 1213 Last data filed at 12/23/2016 0700 Gross per 24 hour  Intake 2334.14 ml  Output 2200 ml  Net 134.14 ml   Filed Weights   12/21/16 0445 12/22/16 0305 12/23/16 0439  Weight: 94.5 kg (208 lb 5.4 oz) 91.3 kg (201 lb 4.5 oz) 92.4 kg (203 lb 11.3 oz)   General: Alert, Awake and Oriented to Time, Place and Person. Appear in severe distress, affect appropriate Eyes: PERRL, Conjunctiva normal ENT: Oral Mucosa clear moist. Neck: difficult to assess JVD, no Abnormal Mass Or lumps Cardiovascular: S1 and S2 Present, no Murmur, Peripheral Pulses Present Respiratory: increased respiratory effort, Bilateral Air entry equal and Decreased, no use of accessory muscle, basal Crackles, upper airway wheezes resolved Abdomen: Bowel Sound absent, Soft and mild tenderness, no hernia Skin: no redness, no Rash, no induration Extremities: trace Pedal edema, no calf tenderness Neurologic: Grossly no focal neuro deficit. Bilaterally Equal motor strength  Data Reviewed: CBC: Recent Labs  Lab 12/18/16 0527 12/19/16 0509 12/21/16 0746 12/22/16 0502 12/23/16 0358  WBC 8.9 7.8 9.7 10.2 14.4*  NEUTROABS  --  6.5  --   --   --  HGB 9.0* 9.3* 8.9* 9.5* 10.9*  HCT 29.5* 30.3* 28.6* 30.1* 35.0*  MCV 87.0 86.8 85.9 86.5 86.6  PLT 397 365 330 324 354*   Basic Metabolic Panel: Recent Labs  Lab 12/18/16 1124  12/19/16 0509 12/20/16 0405 12/21/16 0746 12/22/16 0502 12/22/16 1415 12/23/16 0358  NA 136   < > 136 138 135 133* 132* 134*  K 3.0*   < > 3.7 3.7 3.9 4.0 4.7 4.4  CL 103   < > 105 107 103 100* 101 100*  CO2 22   < > 22 24 25 24 24 23   GLUCOSE 98   < > 155* 129* 139* 144* 191* 151*  BUN 11   < > 9 10 12 17 18 20   CREATININE 0.65   < > 0.51 0.51 0.51 0.57 0.62 0.65  CALCIUM 7.7*   < > 7.9* 7.9* 8.0* 8.1* 7.9* 8.2*  MG 1.7  --  1.7 1.7 1.8 1.9  --  1.9  PHOS 2.3*  --   1.9* 2.9  --  4.3  --  3.8   < > = values in this interval not displayed.    Liver Function Tests: Recent Labs  Lab 12/16/16 1658 12/18/16 2328 12/19/16 0509 12/22/16 1415  AST 16 14* 14* 16  ALT 13* 12* 12* 14  ALKPHOS 94 86 87 95  BILITOT 1.0 0.7 0.6 0.5  PROT 6.0* 5.3* 5.9* 5.5*  ALBUMIN 2.4* 2.1* 2.2* 2.2*   Recent Labs  Lab 12/16/16 1658  LIPASE 129*   No results for input(s): AMMONIA in the last 168 hours. Coagulation Profile: Recent Labs  Lab 12/23/16 0358  INR 1.20   Cardiac Enzymes: Recent Labs  Lab 12/17/16 0854 12/21/16 2336 12/22/16 0502  TROPONINI <0.03 <0.03 <0.03   BNP (last 3 results) No results for input(s): PROBNP in the last 8760 hours. CBG: Recent Labs  Lab 12/22/16 1211 12/22/16 1607 12/22/16 2324 12/23/16 0602 12/23/16 1202  GLUCAP 151* 171* 185* 191* 239*   Studies: Dg Chest Port 1 View  Result Date: 12/23/2016 CLINICAL DATA:  Intubation. EXAM: PORTABLE CHEST 1 VIEW COMPARISON:  12/22/2016, 12/17/2016 and chest CT 12/20/2016 FINDINGS: Endotracheal tube has tip along the right tracheal wall 3.5 cm above the carina. Enteric tube courses into a large hiatal hernia with tip just below the diaphragm. Right-sided PICC line has tip over the SVC unchanged. Lungs are hypoinflated demonstrate opacification over the left mid to lower lung with slightly better aeration over the lateral left base. Findings likely represent today left-sided effusion with atelectasis, although infection is possible. Right lung is clear. Mild stable cardiomegaly. Calcified plaque over the aortic arch. Known large hiatal hernia. Known multiple left posterolateral rib fractures. IMPRESSION: Persistent opacification over the left mid to lower lung likely effusion with atelectasis. Infection in the left base is possible. Stable cardiomegaly. Large hiatal hernia. Known multiple left posterolateral rib fractures. Tubes and lines as described. Electronically Signed   By: Marin Olp M.D.   On: 12/23/2016 10:14   Dg Chest Port 1 View  Result Date: 12/22/2016 CLINICAL DATA:  Shortness of breath and cough. EXAM: PORTABLE CHEST 1 VIEW COMPARISON:  12/17/2016 FINDINGS: Right-sided PICC line with tip in the right atrium. Lungs are hypoinflated with bibasilar opacification left-greater-than-right with possible slight interval progression. Findings may be due to bibasilar atelectasis and left-sided effusion, although infection is possible. Stable cardiomegaly. Known large hiatal hernia. Remainder of the exam is unchanged. IMPRESSION: Worsening bibasilar opacification left-greater-than-right likely bibasilar atelectasis  and left effusion. Basilar infection is possible. Right-sided PICC line with tip over the right atrium. This could be pulled back 5-6 cm. Mild stable cardiomegaly. These results will be called to the ordering clinician or representative by the Radiologist Assistant, and communication documented in the PACS or zVision Dashboard. Electronically Signed   By: Marin Olp M.D.   On: 12/22/2016 14:19   Dg Abd Portable 1v  Result Date: 12/22/2016 CLINICAL DATA:  Vomiting and periumbilical pain. EXAM: PORTABLE ABDOMEN - 1 VIEW COMPARISON:  CT of the abdomen and pelvis 12/17/2016 FINDINGS: There is paucity of bowel gas, limiting evaluation of the caliber of bowel loops. Visualized bowel gas pattern is nonobstructed. Contrast remains in the cecum and scattered colonic diverticula. There are degenerative changes in both hips. IMPRESSION: Nonobstructive bowel gas pattern. Residual contrast following recent CT exam. Electronically Signed   By: Nolon Nations M.D.   On: 12/22/2016 14:20    Scheduled Meds: . cefoTEtan (CEFOTAN) IV  2 g Intravenous To OR  . insulin aspart  0-15 Units Subcutaneous Q6H  . levalbuterol  0.63 mg Inhalation TID  . mouth rinse  15 mL Mouth Rinse BID  . metoprolol tartrate  5 mg Intravenous Q8H  . ondansetron  4 mg Oral Q8H  . pantoprazole  40 mg  Intravenous Q12H  . sodium chloride flush  10-40 mL Intracatheter Q12H   Continuous Infusions: . famotidine (PEPCID) IV Stopped (12/23/16 1120)  . fentaNYL infusion INTRAVENOUS 75 mcg/hr (12/23/16 1101)  . ondansetron (ZOFRAN) IV Stopped (12/23/16 2426)  . piperacillin-tazobactam (ZOSYN)  IV    . TPN ADULT (ION) 75 mL/hr at 12/22/16 1722  . TPN ADULT (ION)    . vancomycin    . vancomycin Stopped (12/23/16 1252)   PRN Meds: acetaminophen **OR** acetaminophen, fentaNYL, LORazepam, midazolam, midazolam, morphine injection, sodium chloride flush  Time spent: The patient is critically ill with multiple organ systems failure and requires high complexity decision making for assessment and support, frequent evaluation and titration of therapies. Critical Care Time devoted to patient care services described in this note is 45 minutes, discussed with Dr. Marlou Porch, general surgery personally, CCM  Author: Berle Mull, MD [facility] Pager: [phone] 12/23/2016 12:13 PM  If 7PM-7AM, please contact night-coverage at [email or web address], password [email or web address]

## 2016-12-23 NOTE — Progress Notes (Signed)
Progress Note  Patient Name: Nicole [name] Date of Encounter: 12/23/2016  Primary Cardiologist: [place]  Subjective   Patient denies pain including chest pain. She is having increased work of breathing.  Inpatient Medications    Scheduled Meds: . cefoTEtan (CEFOTAN) IV  2 g Intravenous To OR  . insulin aspart  0-15 Units Subcutaneous Q6H  . levalbuterol  0.63 mg Inhalation TID  . mouth rinse  15 mL Mouth Rinse BID  . metoprolol tartrate  5 mg Intravenous Q8H  . ondansetron  4 mg Oral Q8H  . pantoprazole  40 mg Intravenous Q12H  . sodium chloride flush  10-40 mL Intracatheter Q12H   Continuous Infusions: . diltiazem (CARDIZEM) infusion 15 mg/hr (12/23/16 0554)  . famotidine (PEPCID) IV Stopped (12/22/16 2318)  . ondansetron (ZOFRAN) IV Stopped (12/23/16 4235)  . TPN ADULT (ION) 75 mL/hr at 12/22/16 1722  . TPN ADULT (ION)     PRN Meds: acetaminophen **OR** acetaminophen, LORazepam, morphine injection, sodium chloride flush   Vital Signs    Vitals:   12/23/16 0500 12/23/16 0600 12/23/16 0700 12/23/16 0733  BP: (!) 149/72 (!) 116/54 118/80   Pulse: (!) 130 (!) 101 (!) 120   Resp: (!) 30 (!) 27 (!) 26   Temp:      TempSrc:      SpO2: 95% 96% 94% 96%  Weight:      Height:        Intake/Output Summary (Last 24 hours) at 12/23/2016 0818 Last data filed at 12/23/2016 0700 Gross per 24 hour  Intake 2383.39 ml  Output 2200 ml  Net 183.39 ml   Filed Weights   12/21/16 0445 12/22/16 0305 12/23/16 0439  Weight: 208 lb 5.4 oz (94.5 kg) 201 lb 4.5 oz (91.3 kg) 203 lb 11.3 oz (92.4 kg)    Telemetry    A fib 110-120s HR- Personally Reviewed  ECG    Sinus tach with diffuse ST segment elevation - Personally Reviewed  Physical Exam   GEN: Ill-appearing, in bed in no acute distress, increased respiratory effort Neck: no JVD Cardiac: tachy irreg; no murmurs, rubs, or gallops, no edema  Respiratory: rhonchorous breath sounds throughout, poor air movement, mild  increased work of breathing GI: soft, distended, decreased BS MS: no deformity or atrophy  Skin: warm and dry, no rash Neuro:  Alert, only able to nod yes/no to questions  Labs    Chemistry Recent Labs  Lab 12/18/16 2328 12/19/16 0509  12/22/16 0502 12/22/16 1415 12/23/16 0358  NA 135 136   < > 133* 132* 134*  K 3.7 3.7   < > 4.0 4.7 4.4  CL 106 105   < > 100* 101 100*  CO2 21* 22   < > 24 24 23   GLUCOSE 136* 155*   < > 144* 191* 151*  BUN 10 9   < > 17 18 20   CREATININE 0.58 0.51   < > 0.57 0.62 0.65  CALCIUM 7.7* 7.9*   < > 8.1* 7.9* 8.2*  PROT 5.3* 5.9*  --   --  5.5*  --   ALBUMIN 2.1* 2.2*  --   --  2.2*  --   AST 14* 14*  --   --  16  --   ALT 12* 12*  --   --  14  --   ALKPHOS 86 87  --   --  95  --   BILITOT 0.7 0.6  --   --  0.5  --  GFRNONAA >60 >60   < > >60 >60 >60  GFRAA >60 >60   < > >60 >60 >60  ANIONGAP 8 9   < > 9 7 11    < > = values in this interval not displayed.     Hematology Recent Labs  Lab 12/21/16 0746 12/22/16 0502 12/23/16 0358  WBC 9.7 10.2 14.4*  RBC 3.33* 3.48* 4.04  HGB 8.9* 9.5* 10.9*  HCT 28.6* 30.1* 35.0*  MCV 85.9 86.5 86.6  MCH 26.7 27.3 27.0  MCHC 31.1 31.6 31.1  RDW 15.4 15.8* 15.9*  PLT 330 324 481*    Cardiac Enzymes Recent Labs  Lab 12/17/16 0854 12/21/16 2336 12/22/16 0502  TROPONINI <0.03 <0.03 <0.03    Recent Labs  Lab 12/17/16 0907  TROPIPOC 0.00     BNP Recent Labs  Lab 12/17/16 0854  BNP 83.7     DDimer No results for input(s): DDIMER in the last 168 hours.   Radiology    Dg Chest Port 1 View  Result Date: 12/22/2016 CLINICAL DATA:  Shortness of breath and cough. EXAM: PORTABLE CHEST 1 VIEW COMPARISON:  12/17/2016 FINDINGS: Right-sided PICC line with tip in the right atrium. Lungs are hypoinflated with bibasilar opacification left-greater-than-right with possible slight interval progression. Findings may be due to bibasilar atelectasis and left-sided effusion, although infection is  possible. Stable cardiomegaly. Known large hiatal hernia. Remainder of the exam is unchanged. IMPRESSION: Worsening bibasilar opacification left-greater-than-right likely bibasilar atelectasis and left effusion. Basilar infection is possible. Right-sided PICC line with tip over the right atrium. This could be pulled back 5-6 cm. Mild stable cardiomegaly. These results will be called to the ordering clinician or representative by the Radiologist Assistant, and communication documented in the PACS or zVision Dashboard. Electronically Signed   By: Marin Olp M.D.   On: 12/22/2016 14:19   Dg Abd Portable 1v  Result Date: 12/22/2016 CLINICAL DATA:  Vomiting and periumbilical pain. EXAM: PORTABLE ABDOMEN - 1 VIEW COMPARISON:  CT of the abdomen and pelvis 12/17/2016 FINDINGS: There is paucity of bowel gas, limiting evaluation of the caliber of bowel loops. Visualized bowel gas pattern is nonobstructed. Contrast remains in the cecum and scattered colonic diverticula. There are degenerative changes in both hips. IMPRESSION: Nonobstructive bowel gas pattern. Residual contrast following recent CT exam. Electronically Signed   By: Nolon Nations M.D.   On: 12/22/2016 14:20    Cardiac Studies   ECHO -EF 55%, mod LAE  Patient Profile     66 y.o. female with PAF, gastric mass with abnormal EKG showing diffuse ST segment elevation.  Assessment & Plan     Abnormal EKG/increased respiratory effort Patient had recurrence of dyspnea and repeat lasix dosing was provided with good urine output. EKG this morning shows diffuse ST segment elevation likely related to inflammation from large hiatal hernia and mass.   PAF She is back into A fib this morning, with rates generally >120 on diltiazem 15mg /hr.  Gastric mass/large hiatal hernia Per GI and surgery, patient to go for laparotomy +/- thoracotomy today.  For questions or updates, please contact [place] Please consult [email or web address] for contact info  under Cardiology/STEMI.      Signed, Alphonzo Grieve, MD  12/23/2016, 8:18 AM    65 with gastric tumor, acute respiratory failure, paroxysmal atrial fibrillation currently in atrial fibrillation, abnormal EKG now in ICU.  She is currently intubated, sedate, blood-tinged fluid seen in OG tube.  Paroxysmal atrial fibrillation -If heart rates exceed 120, consider adding  back diltiazem IV starting at 5 mg and titrating upward to 15 mg/h.  For now, this is off because of subsequent hypotension following   Rapid response.  Acute respiratory failure -Critical care team following.  She was definitely showing signs of respiratory effort yesterday despite IV diuresis.  Large hiatal hernia seen on chest x-ray.  Gastric mass -Surgery currently on hold, Dr. Marlou Starks.  Abnormal EKG/inferior ST elevation noted - We will go ahead and repeat limited echocardiogram.  Her subsequent troponins after EKG findings have been normal.  This is not a STEMI.  It is likely secondary to inflammation, like pericarditis, possibly from juxtaposed gastric mass.  Candee Furbish, MD

## 2016-12-23 NOTE — Plan of Care (Signed)
  Education: Knowledge of [place] Education information/materials will improve 12/23/2016 0401 - Progressing by [name], RN Note Attempts to update patient on plan of care. She is aware she is having surgery-.but more out of it today. Family requesting to speak to MD prior to signing consent for procedure.    Safety: Ability to remain free from injury will improve 12/23/2016 0401 - Progressing by Belmira Daley A, RN Note Reminders to stay in bed and lay back instead of sitting on side of bed. Patient now using Purewick to avoid getting up to [facility].    Pain Managment: General experience of comfort will improve 12/23/2016 0401 - Not Progressing by Lando Alcalde A, RN Note Patient frequently coughing hard and throwing up on night shift. Pain medication not lasting until next dose at this point. Moaning constantly . After medication given- patient only quiet for 1 hour before she starts to moan again. Patient too drowsy to request high dose or  more pain medication. Monitored closely.

## 2016-12-23 NOTE — Procedures (Signed)
NGT placement  OGT unable Placed pre ETT to avoid asp during I placed rt nares NGT easily, back thick smelly pus yellow gatsirc contants To suction Placed at 20 to not force past lesion  Lavon Paganini. Titus Mould, MD, [place] Pgr: [facility]

## 2016-12-23 NOTE — Progress Notes (Signed)
[facility] Progress Note Patient Name: Nicole [name] DOB: [birth year] MRN: [id number]   Date of Service  12/23/2016  HPI/Events of Note  Continues to be in A. Fib MAP is now in the 60s  eICU Interventions  Bolus 500 cc normal saline Hold Cardizem.  Start amiodarone drip     Intervention Category Major Interventions: Arrhythmia - evaluation and management  Amye Grego 12/23/2016, 8:49 PM

## 2016-12-23 NOTE — Progress Notes (Signed)
Called to bedside by SWOT RN Roselyn Reef to assist with intubation on 51M.  On my arrival NP Richardson Landry Minor and Dr. Titus Mould at bedside preparing for intubation.  Sedated per orders for NGT placement - 1 mg Versed IV and 100 mcg Fentanyl IV.  NGT inserted left nares per Dr. Titus Mould - large amount purlent foul smelling drainage - placed on suction.  Etomidate 20 mg IV given per order for intubation. Intubated per PCCM team- bil BS noted with postitive color change  - tube secured by RT Kathlee Nations. VS remained stable with mild hypotension -  Post intubation patient given 500 cc NS bolus  - 2 mg Versed IV given for sedation per order - transferred to 2M14 with RT and SWOT RN - 168mcg Fentanyl IV given in unit for agitation - Richardson Landry Minor NP present - handoff to [employer].  PCXR done for tube placement.  Family updated by Abbott Pao.

## 2016-12-23 NOTE — Progress Notes (Signed)
RT attempted ABG LR x2 without success. Will have additional RT attempt. RN made aware. RT will continue to monitor.

## 2016-12-23 NOTE — Progress Notes (Signed)
Pharmacy Antibiotic Note  Nicole [name] is a 66 y.o. female admitted on 12/16/2016 with sepsis.  Pharmacy has been consulted for vancomycin and zosyn dosing.  Patient looks worse overall today. LA was elevated yesterday. Has had increased work of breathing today. WBC increasing to 14.4. Suppose to go to OR today for trans hiatal gastrectomy and possible thoracotomy. SCr stable, CrCl ~17ml/min  Plan: Start Zosyn 3.375 gm IV q8h (4 hour infusion) Give vancomycin 1,750mg  IV x 1, then start vancomycin 1,250mg  IV Q12h Monitor clinical picture, renal function, VT prn F/U C&S, abx deescalation / LOT   Height: 5\' 8"  (172.7 cm) Weight: 203 lb 11.3 oz (92.4 kg) IBW/kg (Calculated) : 63.9  Temp (24hrs), Avg:98.6 F (37 C), Min:97.8 F (36.6 C), Max:99.7 F (37.6 C)  Recent Labs  Lab 12/18/16 0527  12/19/16 0509 12/20/16 0405 12/21/16 0746 12/22/16 0502 12/22/16 1415 12/23/16 0358  WBC 8.9  --  7.8  --  9.7 10.2  --  14.4*  CREATININE  --    < > 0.51 0.51 0.51 0.57 0.62 0.65  LATICACIDVEN  --   --   --   --   --   --  2.0*  --    < > = values in this interval not displayed.    Estimated Creatinine Clearance: 81.1 mL/min (by C-G formula based on SCr of 0.65 mg/dL).    Allergies  Allergen Reactions  . Other Shortness Of Breath and Anxiety    Reaction to fast food and processed food  . Shellfish Allergy Shortness Of Breath  . Iodinated Diagnostic Agents Swelling    Hand swelling from contrast dye for MRI    Antimicrobials this admission: Zosyn 11/9 >>  Vancomycin 11/9 >>   Dose adjustments this admission: n/a  Microbiology results: 11/9 BCx: sent 11/3 MRSA PCR: negative  Thank you for allowing pharmacy to be a part of this patient's care.  Elenor Quinones, PharmD, BCPS Clinical Pharmacist Pager [phone] 12/23/2016 8:35 AM

## 2016-12-23 NOTE — Progress Notes (Signed)
  Echocardiogram 2D Echocardiogram has been performed.  Nicole [name] 12/23/2016, 3:07 PM

## 2016-12-23 NOTE — Progress Notes (Signed)
Patient continued to have increased difficulty breathing and altered mental status.  Abdominal surgery on hold for now until pulmonary status fiquredvout more closely.  Dr Rosina Lowenstein and Richardson Landry Minor, NPH in to intubate patient with the assistance of Rapid Response team , Audery Amel, Respiratory team and charge Montura, Therapist, sports. Report called to Lelan Pons, RN on 92M for transfer on 92M14.  Patient transferred by Rapid Response team.

## 2016-12-23 NOTE — Anesthesia Preprocedure Evaluation (Deleted)
Anesthesia Evaluation    Airway        Dental   Pulmonary           Cardiovascular hypertension, Pt. on medications      Neuro/Psych    GI/Hepatic   Endo/Other    Renal/GU      Musculoskeletal   Abdominal   Peds  Hematology   Anesthesia Other Findings   Reproductive/Obstetrics                             Anesthesia Physical Anesthesia Plan  ASA: II  Anesthesia Plan: General   Post-op Pain Management:    Induction:   PONV Risk Score and Plan:   Airway Management Planned:   Additional Equipment:   Intra-op Plan:   Post-operative Plan:   Informed Consent:   Plan Discussed with:   Anesthesia Plan Comments:         Anesthesia Quick Evaluation

## 2016-12-23 NOTE — Progress Notes (Signed)
PHARMACY - ADULT TOTAL PARENTERAL NUTRITION CONSULT NOTE   Pharmacy Consult:  TPN Indication:  Prolonged NPO status / GOO  Patient Measurements: Height: 5\' 8"  (172.7 cm) Weight: 203 lb 11.3 oz (92.4 kg) IBW/kg (Calculated) : 63.9 TPN AdjBW (KG): 74.4 Body mass index is 30.97 kg/m.  Assessment:  Nicole [name] presented on 12/16/16 with N/V and essentially no PO intake x4 days.  CT shows gastric mass and EGD concerning for malignant gastric tumor.  Per Surgery, if GIST then consider Gleevec to shrink tumor and will need J-tube for EN; if adenocarcinoma then will need additional work-up.  While awaiting biopsy result to determine care, Pharmacy consulted to initiate TPN for nutritional support.  Noted patient has a history of EtOH use.  GI: Hx GERD, presented with hematemesis on Protonix + Pepcid. New large gastric body mass causing GOO and hiatal hernia. OR scheduled for 11/9. Continues to report abdominal pain and persistent nausea / emesis.  LBM 11/8, +flatus.  Scheduled Zofran - 11/9 - laparotomy, partial vs. Total gastrectomy, possible thoracotomy Endo: thyroid gland mass on CT.  No hx DM - CBGs 150-190s Insulin requirements in the past 24 hours: 12 units mSSI + 20 units regular insulin in the TPN Lytes: Na up 144, Cl stable 100, all others WNL Renal: new renal mass. SCr stable, BUN WNL - UOP 1 ml/kg/hr, fluids d/c'd. Net +4.8L since admit. S/p lasix on 11/6-8 Pulm: small pleural effusion on CT. RA>Botines. xopenex Cards: HTN - Afib with RVR on admit, no AC d/t hematemesis - BP ok, ST 120s, diltiazem gtt, lopressor IV prn Hepatobil: LFTs / tbili / TG WNL, INR 1.2 Neuro: EtOH - thiamine, folate, multivitamin in TPN.  PRN morphine/Ativan ID: afebrile, WBC up to 14.4 - not on abx Best Practices: SCDs TPN Access: PICC placed 12/18/16  TPN start date: 12/18/16  Nutritional Goals (RD rec on 11/5): Kcal: 2000-2200  Protein: 100-115 g per day Fluid: 2.0-2.2 L  Current Nutrition:  TPN   Plan:   Continue TPN at 75 ml/hr, providing 108g AA, 324 g CHO, and 47 g of lipids, which adds up to 2001 kCal and meeting 100% of patient needs Lytes in TPN: no change today, Cl/Acetate ratio 1:1 Daily multivitamin and trace elements in TPN Add thiamine 100mg  and folic acid 1mg  to TPN Continue moderate SSI Q6H + increase to 30 units regular insulin in TPN F/U iron supplementation when possible F/U AM labs, plans for nutrition post-op   [name], PharmD, BCPS Clinical Pharmacist 12/23/2016 7:24 AM

## 2016-12-23 NOTE — Progress Notes (Signed)
Report received from [facility], B., RN from [place]. Attending physician at the bedside to evaluate the pt. The pt may not come for the procedure today.

## 2016-12-23 NOTE — Progress Notes (Signed)
Pt transported from 3M06 to 2M14 on ventilator. Pt stable throughout with no complications. VS within normal limits. Report given at bedside to receiving RT.

## 2016-12-23 NOTE — Progress Notes (Signed)
5 Days Post-Op   Subjective/Chief Complaint: Has developed respiratory distress yesterday and today   Objective: Vital signs in last 24 hours: Temp:  [97.8 F (36.6 C)-99.7 F (37.6 C)] 98.9 F (37.2 C) (11/09 0300) Pulse Rate:  [88-139] 120 (11/09 0700) Resp:  [20-31] 26 (11/09 0700) BP: (90-149)/(54-117) 118/80 (11/09 0700) SpO2:  [87 %-99 %] 96 % (11/09 0733) Weight:  [92.4 kg (203 lb 11.3 oz)] 92.4 kg (203 lb 11.3 oz) (11/09 0439) Last BM Date: 12/22/16  Intake/Output from previous day: 11/08 0701 - 11/09 0700 In: 2383.4 [I.V.:2121.4; IV Piggyback:262] Out: 2200 [Urine:2200] Intake/Output this shift: No intake/output data recorded.  General appearance: delirious and moderate distress Resp: diminished breath sounds anterior - left Cardio: tachycardic GI: soft, mild tenderness  Lab Results:  Recent Labs    12/22/16 0502 12/23/16 0358  WBC 10.2 14.4*  HGB 9.5* 10.9*  HCT 30.1* 35.0*  PLT 324 481*   BMET Recent Labs    12/22/16 1415 12/23/16 0358  NA 132* 134*  K 4.7 4.4  CL 101 100*  CO2 24 23  GLUCOSE 191* 151*  BUN 18 20  CREATININE 0.62 0.65  CALCIUM 7.9* 8.2*   PT/INR Recent Labs    12/23/16 0358  LABPROT 15.1  INR 1.20   ABG Recent Labs    12/22/16 1430  PHART 7.438  HCO3 25.1    Studies/Results: Dg Chest Port 1 View  Result Date: 12/22/2016 CLINICAL DATA:  Shortness of breath and cough. EXAM: PORTABLE CHEST 1 VIEW COMPARISON:  12/17/2016 FINDINGS: Right-sided PICC line with tip in the right atrium. Lungs are hypoinflated with bibasilar opacification left-greater-than-right with possible slight interval progression. Findings may be due to bibasilar atelectasis and left-sided effusion, although infection is possible. Stable cardiomegaly. Known large hiatal hernia. Remainder of the exam is unchanged. IMPRESSION: Worsening bibasilar opacification left-greater-than-right likely bibasilar atelectasis and left effusion. Basilar infection is  possible. Right-sided PICC line with tip over the right atrium. This could be pulled back 5-6 cm. Mild stable cardiomegaly. These results will be called to the ordering clinician or representative by the Radiologist Assistant, and communication documented in the PACS or zVision Dashboard. Electronically Signed   By: Marin Olp M.D.   On: 12/22/2016 14:19   Dg Abd Portable 1v  Result Date: 12/22/2016 CLINICAL DATA:  Vomiting and periumbilical pain. EXAM: PORTABLE ABDOMEN - 1 VIEW COMPARISON:  CT of the abdomen and pelvis 12/17/2016 FINDINGS: There is paucity of bowel gas, limiting evaluation of the caliber of bowel loops. Visualized bowel gas pattern is nonobstructed. Contrast remains in the cecum and scattered colonic diverticula. There are degenerative changes in both hips. IMPRESSION: Nonobstructive bowel gas pattern. Residual contrast following recent CT exam. Electronically Signed   By: Nolon Nations M.D.   On: 12/22/2016 14:20    Anti-infectives: Anti-infectives (From admission, onward)   Start     Dose/Rate Route Frequency Ordered Stop   12/23/16 2200  vancomycin (VANCOCIN) 1,250 mg in sodium chloride 0.9 % 250 mL IVPB     1,250 mg 166.7 mL/hr over 90 Minutes Intravenous Every 12 hours 12/23/16 0840     12/23/16 1500  piperacillin-tazobactam (ZOSYN) IVPB 3.375 g     3.375 g 12.5 mL/hr over 240 Minutes Intravenous Every 8 hours 12/23/16 0840     12/23/16 0900  vancomycin (VANCOCIN) 1,750 mg in sodium chloride 0.9 % 500 mL IVPB     1,750 mg 250 mL/hr over 120 Minutes Intravenous  Once 12/23/16 0836  12/23/16 0900  piperacillin-tazobactam (ZOSYN) IVPB 3.375 g     3.375 g 100 mL/hr over 30 Minutes Intravenous  Once 12/23/16 0836     12/23/16 0600  cefoTEtan in Dextrose 5% (CEFOTAN) IVPB 2 g     2 g Intravenous To Surgery 12/22/16 1401 12/24/16 0600   12/17/16 0515  piperacillin-tazobactam (ZOSYN) IVPB 3.375 g     3.375 g 100 mL/hr over 30 Minutes Intravenous  Once 12/17/16 0503  12/17/16 0636      Assessment/Plan: s/p Procedure(s): ESOPHAGOGASTRODUODENOSCOPY (EGD) WITH PROPOFOL (N/A) New respiratoty distress  Holding on surgery for now May need intubation. Will work up new pulmonary situation May need CT of chest Large hiatal hernia with large gastric mass Will follow closely  LOS: 6 days    TOTH III,PAUL S 12/23/2016

## 2016-12-23 NOTE — Procedures (Signed)
Intubation Procedure Note Lismary Kiehn [id number] 13-Jan-1951  Procedure: Intubation Indications: Airway protection and maintenance  Procedure Details Consent: Risks of procedure as well as the alternatives and risks of each were explained to the (patient/caregiver).  Consent for procedure obtained. Time Out: Verified patient identification, verified procedure, site/side was marked, verified correct patient position, special equipment/implants available, medications/allergies/relevent history reviewed, required imaging and test results available.  Performed  MAC and 3 Medications:  Fentanyl 100 mcg Etomidate 20 mg Versed 2 mg NMB    Evaluation Hemodynamic Status: Transient hypotension treated with fluid; O2 sats: stable throughout Patient's Current Condition: stable Complications: No apparent complications Patient did tolerate procedure well. Chest X-ray ordered to verify placement.  CXR: pending.   Richardson Landry Rumeal Cullipher ACNP Maryanna Shape PCCM Pager [phone] till 3 pm If no answer page [phone] 12/23/2016, 9:58 AM

## 2016-12-23 NOTE — Progress Notes (Signed)
Nutrition Follow Up  DOCUMENTATION CODES:   Obesity unspecified  INTERVENTION:    TPN per pharmacy  NUTRITION DIAGNOSIS:   Severe Malnutrition related to catabolic illness as evidenced by energy intake < or equal to 75% for > or equal to 1 month, percent weight loss (9% x 3 months)  Ongoing  GOAL:   Provide needs based on ASPEN/SCCM guidelines   Progressing  MONITOR:   Vent status, Labs, Weight trends, Skin, I & O's  ASSESSMENT:   66 yo Female with PMH of HTN, arthritis and ETOH abuse; admitted with one week of constant nausea, constant vomiting, severe abdominal pain, and hematemesis.  11/14 s/p EGD >> likely malignant gastric tumor in gastric body  Patient is currently intubated on ventilator support Temp (24hrs), Avg:98.7 F (37.1 C), Min:97.8 F (36.6 C), Max:99.8 F (37.7 C)  Transferred from 41M to 4M-MICU today due to acute respiratory distress. Surgery on hold for now (trans-hiatal gastrectomy & possible thoracotomy). NGT placed to LIS. + purulent foul-smelling output. Labs and medications reviewed. Na 134 (L). CBG's [phone].  Receiving customized TPN via PICC at 75 ml/hr.  Provides 2001 kcal and 108 grams protein per day.  Meets >100% minimum estimated energy needs and 82% minimum estimated protein needs.  NUTRITION - FOCUSED PHYSICAL EXAM:    Most Recent Value  Orbital Region  Unable to assess  Upper Arm Region  Unable to assess  Thoracic and Lumbar Region  Unable to assess  Buccal Region  Unable to assess  [place] Region  Unable to assess  Clavicle Bone Region  Unable to assess  Clavicle and Acromion Bone Region  Unable to assess  Scapular Bone Region  Unable to assess  Dorsal Hand  Unable to assess  Patellar Region  Unable to assess  Anterior Thigh Region  Unable to assess  Posterior Calf Region  Unable to assess  Edema (RD Assessment)  Unable to assess  NFPE deferred again 11/9; pt undergoing 2D-ECHO     Diet Order:  Diet NPO time  specified Except for: Ice Chips TPN ADULT (ION) TPN ADULT (ION)  EDUCATION NEEDS:   No education needs have been identified at this time  Skin:  Skin Assessment: Reviewed RN Assessment  Last BM:  11/8   Intake/Output Summary (Last 24 hours) at 12/23/2016 1428 Last data filed at 12/23/2016 1400 Gross per 24 hour  Intake 2908.27 ml  Output 2400 ml  Net 508.27 ml   Height:   Ht Readings from Last 1 Encounters:  12/18/16 5\' 8"  (1.727 m)   Weight:   Wt Readings from Last 1 Encounters:  12/23/16 203 lb 11.3 oz (92.4 kg)   Ideal Body Weight:  66 kg  BMI:  Body mass index is 30.97 kg/m.  Estimated Nutritional Needs:   Kcal:  7078-6754  Protein:  >/= 132 gm  Fluid:  per MD  Arthur Holms, RD, LDN Pager #: [phone] After-Hours Pager #: [phone]

## 2016-12-23 NOTE — H&P (Signed)
PULMONARY / CRITICAL CARE MEDICINE   Name: Nicole [name] MRN: [id number] DOB: 15-Apr-1950    ADMISSION DATE:  12/16/2016 CONSULTATION DATE: 12/23/2016  REFERRING MD: [facility]  CHIEF COMPLAINT: Gastric mass  HISTORY OF PRESENT ILLNESS:   66 year old white female with a history of long-term EtOH abuse.  Stopped drinking approximately 2 months ago but had a episode of binge drinking about 2 weeks ago.  Admitted for shortness of breath and gastric pain.  He was attempted by Dr. Michail Sermon was unable to pass the scope beyond the gastric mass.  She is scheduled for surgery on 12/23/2016 for evaluation of gastric mass and liberation of a hiatal hernia.  Will be CCM was called to the bedside 12/23/2016 and found her in acute respiratory distress.  She was intubated urgently NG tube was placed and foul-smelling purulent secretions were obtained from her NG tube and from her endotracheal tube.  She was transferred to the intensive care unit and will be continue to be evaluated for surgical interventions.  PAST MEDICAL HISTORY :  She  has a past medical history of Arthritis, Chronic alcoholism ([place]), Chronic left shoulder pain, Heartburn, Hiatal hernia (12/17/2016), and Hypertension.  PAST SURGICAL HISTORY: She  has a past surgical history that includes Ankle fracture surgery (Right, 2008); Ankle fracture surgery (Left, 2008); and ESOPHAGOGASTRODUODENOSCOPY (EGD) WITH PROPOFOL (N/A, 12/18/2016).  Allergies  Allergen Reactions  . Other Shortness Of Breath and Anxiety    Reaction to fast food and processed food  . Shellfish Allergy Shortness Of Breath  . Iodinated Diagnostic Agents Swelling    Hand swelling from contrast dye for MRI    No current facility-administered medications on file prior to encounter.    Current Outpatient Medications on File Prior to Encounter  Medication Sig  . acetaminophen (TYLENOL) 500 MG tablet Take 1,000 mg by mouth at bedtime.  . Calcium Carb-Cholecalciferol (CALCIUM 600 +  D PO) Take 600 mg by mouth daily with breakfast.  . ferrous sulfate 325 (65 FE) MG tablet Take 325 mg by mouth daily with breakfast.  . guaiFENesin (MUCINEX) 600 MG 12 hr tablet Take 600 mg by mouth daily.  Marland Kitchen ibuprofen (ADVIL,MOTRIN) 200 MG tablet Take 400 mg by mouth daily as needed (arthritis pain).  Marland Kitchen lisinopril-hydrochlorothiazide (PRINZIDE,ZESTORETIC) 20-25 MG tablet Take 1 tablet by mouth daily.  . Multiple Vitamin (MULTIVITAMIN WITH MINERALS) TABS tablet Take 1 tablet by mouth daily with breakfast.  . ondansetron (ZOFRAN-ODT) 8 MG disintegrating tablet Take 8 mg by mouth every 8 (eight) hours as needed for nausea or vomiting.  . pantoprazole (PROTONIX) 40 MG tablet Take 40 mg by mouth daily.  . phenylephrine (SUDAFED PE) 10 MG TABS tablet Take 10 mg by mouth at bedtime.  Marland Kitchen Propylene Glycol (SYSTANE COMPLETE) 0.6 % SOLN Place 1 drop into both eyes See admin instructions. Instill one drop into both eyes every morning, may also use later in the day as needed for dry eyes  . sucralfate (CARAFATE) 1 g tablet Take 1 g by mouth See admin instructions. Take 1 tablet (1 g) by mouth 4 times daily - thirty minutes before meals and bedtime    FAMILY HISTORY:  Her indicated that her mother is deceased. She indicated that her father is deceased. She indicated that all of her three sisters are alive. She indicated that her brother is alive.   SOCIAL HISTORY: She  reports that  has never smoked. she has never used smokeless tobacco. She reports that she drinks alcohol. She reports that  she does not use drugs.  REVIEW OF SYSTEMS:   10 point review of system taken, please see HPI for positives and negatives.   SUBJECTIVE:  66 year old female intubated for acute respiratory distress.  Transferred to the intensive care unit.  Question if she will undergo surgery in the near future.  VITAL SIGNS: BP (!) 87/56   Pulse (!) 54   Temp 98.2 F (36.8 C) (Oral)   Resp (!) 26   Ht 5\' 8"  (5.400 m)   Wt  203 lb 11.3 oz (92.4 kg)   SpO2 94%   BMI 30.97 kg/m   HEMODYNAMICS:    VENTILATOR SETTINGS: Vent Mode: PRVC FiO2 (%):  [100 %] 100 % Set Rate:  [16 bmp] 16 bmp Vt Set:  [450 mL] 450 mL PEEP:  [5 cmH20] 5 cmH20 Plateau Pressure:  [29 cmH20] 29 cmH20  INTAKE / OUTPUT: I/O last 3 completed shifts: In: 3514.1 [I.V.:3094.1; IV Piggyback:420] Out: 2740 [Urine:2740]  PHYSICAL EXAMINATION: General: Ill-appearing obese white female who is in acute respiratory distress prior to intubation. Neuro: Prior to intubation arouses to voice follow commands weakly HEENT: Now has an NG tube in the right nares purulent drainage.  Tracheal tube connected to ventilator and with foul-smelling purulent drainage.  No JVD appreciated note she has a loose tooth in the right lower midline still present after intubation on 12/23/2016  intubation Cardiovascular: Heart sounds are regular intermittent atrial fibrillation with a rapid ventricular response. periods of hypotension Lungs: Coarse rhonchi bilaterally torus respirations prior to intubation most likely from collected secretions in her throat.  During intubation noted she was obviously aspirating Abdomen: Abdomen is large distended decreased bowel sounds.  NG tube with milky brown purulent foul-smelling secretions Musculoskeletal: Intact Skin: Skin is warm and dry  LABS:  BMET Recent Labs  Lab 12/22/16 0502 12/22/16 1415 12/23/16 0358  NA 133* 132* 134*  K 4.0 4.7 4.4  CL 100* 101 100*  CO2 24 24 23   BUN 17 18 20   CREATININE 0.57 0.62 0.65  GLUCOSE 144* 191* 151*    Electrolytes Recent Labs  Lab 12/20/16 0405 12/21/16 0746 12/22/16 0502 12/22/16 1415 12/23/16 0358  CALCIUM 7.9* 8.0* 8.1* 7.9* 8.2*  MG 1.7 1.8 1.9  --  1.9  PHOS 2.9  --  4.3  --  3.8    CBC Recent Labs  Lab 12/21/16 0746 12/22/16 0502 12/23/16 0358  WBC 9.7 10.2 14.4*  HGB 8.9* 9.5* 10.9*  HCT 28.6* 30.1* 35.0*  PLT 330 324 481*    Coag's Recent Labs   Lab 12/23/16 0358  APTT 31  INR 1.20    Sepsis Markers Recent Labs  Lab 12/22/16 1415  LATICACIDVEN 2.0*    ABG Recent Labs  Lab 12/22/16 1430  PHART 7.438  PCO2ART 37.7  PO2ART 81.6*    Liver Enzymes Recent Labs  Lab 12/18/16 2328 12/19/16 0509 12/22/16 1415  AST 14* 14* 16  ALT 12* 12* 14  ALKPHOS 86 87 95  BILITOT 0.7 0.6 0.5  ALBUMIN 2.1* 2.2* 2.2*    Cardiac Enzymes Recent Labs  Lab 12/17/16 0854 12/21/16 2336 12/22/16 0502  TROPONINI <0.03 <0.03 <0.03    Glucose Recent Labs  Lab 12/21/16 2354 12/22/16 0604 12/22/16 1211 12/22/16 1607 12/22/16 2324 12/23/16 0602  GLUCAP 169* 152* 151* 171* 185* 191*    Imaging Dg Chest Port 1 View  Result Date: 12/22/2016 CLINICAL DATA:  Shortness of breath and cough. EXAM: PORTABLE CHEST 1 VIEW COMPARISON:  12/17/2016 FINDINGS:  Right-sided PICC line with tip in the right atrium. Lungs are hypoinflated with bibasilar opacification left-greater-than-right with possible slight interval progression. Findings may be due to bibasilar atelectasis and left-sided effusion, although infection is possible. Stable cardiomegaly. Known large hiatal hernia. Remainder of the exam is unchanged. IMPRESSION: Worsening bibasilar opacification left-greater-than-right likely bibasilar atelectasis and left effusion. Basilar infection is possible. Right-sided PICC line with tip over the right atrium. This could be pulled back 5-6 cm. Mild stable cardiomegaly. These results will be called to the ordering clinician or representative by the Radiologist Assistant, and communication documented in the PACS or zVision Dashboard. Electronically Signed   By: Marin Olp M.D.   On: 12/22/2016 14:19   Dg Abd Portable 1v  Result Date: 12/22/2016 CLINICAL DATA:  Vomiting and periumbilical pain. EXAM: PORTABLE ABDOMEN - 1 VIEW COMPARISON:  CT of the abdomen and pelvis 12/17/2016 FINDINGS: There is paucity of bowel gas, limiting evaluation of the  caliber of bowel loops. Visualized bowel gas pattern is nonobstructed. Contrast remains in the cecum and scattered colonic diverticula. There are degenerative changes in both hips. IMPRESSION: Nonobstructive bowel gas pattern. Residual contrast following recent CT exam. Electronically Signed   By: Nolon Nations M.D.   On: 12/22/2016 14:20     STUDIES:  12/19/2016 2D echo was noted grade 1 diastolic dysfunction EF of 55%  CULTURES: 12/23/2016 blood cultures x2>> 12/23/2016 sputum culture x1>> 12/23/2016 urine culture>>  ANTIBIOTICS: 12/23/2016 Zosyn>> 12/23/2016 vancomycin>>  SIGNIFICANT EVENTS: 12/23/2016 urgently intubated in stepdown unit  LINES/TUBES: 12/23/2016 endotracheal tube placed>> 12/18/2016 right PICC>>  DISCUSSION: 66 year old white female with extensive past medical history above for extensive alcohol use diagnosed gastric mass sliding hiatal hernia.  She was scheduled for surgery 12/23/2016 for resection of gastric mass and treatment of hiatal hernia.  P CCM was called on 12/23/2016 due to respiratory distress.  P CCM elected to intubate and place an NG tube at that time.  Notable for having foul-smelling purulent material is coming from her stomach.  She was obviously aspirating at the time of intubation.  Please note she is got a loose lower midline tooth  ASSESSMENT / PLAN:  PULMONARY A: Acute respiratory failure Currently vent dependent respiratory failure secondary to respiratory insufficiency Observed aspiration during intubation P:   Vent bundle ABG 1 hour after intubation Sputum culture Bronchial dilators  CARDIOVASCULAR A:  Intermittent atrial fibrillation Congestive heart failure treated with aggressive diuresis Transient hypotension Grade 1 diastolic dysfunction per 2D echo on 12/19/2016 EF of 55% P:  Check central venous pressure Beta-blockers or calcium channel blockers as needed  fluid as needed  RENAL Lab Results  Component Value Date    CREATININE 0.65 12/23/2016   CREATININE 0.62 12/22/2016   CREATININE 0.57 12/22/2016   Recent Labs  Lab 12/22/16 0502 12/22/16 1415 12/23/16 0358  K 4.0 4.7 4.4   Recent Labs  Lab 12/22/16 0502 12/22/16 1415 12/23/16 0358  NA 133* 132* 134*     A:   Renal mass Hyponatremia P:   Further evaluation in the near future Monitor electrolytes  GASTROINTESTINAL A:   Known GI mass Hiatal hernia TPN nutrition P:   Place NG tube noted to have purulent drainage Surgery is evaluating for possible laparotomy.  Note she may need thoracic interventions for this hiatal hernia and thoracic cavity Continue TPN for now  HEMATOLOGIC Recent Labs    12/22/16 0502 12/23/16 0358  HGB 9.5* 10.9*    A:   Anemia P:  Serial hemoglobins Transfuse  per protocol  INFECTIOUS A:   Presumed abdominal infection. Observed aspiration P:   See ID data  ENDOCRINE\ CBG (last 3)  Recent Labs    12/22/16 1607 12/22/16 2324 12/23/16 0602  GLUCAP 171* 185* 191*    A:   Hyperglycemia  P:   Sliding scale insulin  NEUROLOGIC A:   Altered mental status Sedation for vent tolerance P:   RASS goal: -1 We will use fentanyl and Versed due to her hypertensive state.   FAMILY  - Updates: Sons updated at bedside  - Inter-disciplinary family meet or Palliative Care meeting due by:  day 7    [facility] ACNP Maryanna Shape PCCM Pager [phone] till 1 pm If no answer page [phone] 12/23/2016, 10:07 AM  STAFF NOTE: I, Merrie Roof, MD FACP have personally reviewed patient's available data, including medical history, events of note, physical examination and test results as part of my evaluation. I have discussed with resident/NP and other care providers such as pharmacist, RN and RRT. In addition, I personally evaluated patient and elicited key findings of: in bed with moderate distress and poor alertness and airway protection, ronchi in neck and bilateral lung fields, abdo soft,  low BS, no rash, no edema, CT I reviewed showed mass in herniated stomach in chest, pcxr none new, await new , pt requires emergent intubation, I am worried about aspiration given outlet obstruction, UPDATE: noted upon view significant aspiration had been occurring, place gastric tube to suction, STAT ETT assessment on pcxr, abg to follow, assess lactic, ldh, ensure no ischemia to stomach,obtain CT chest abdo /pelvis, ensure no perf, holding OR time but holding surgery until CT done, continued aspiration coverage, hold lasix, send sputum and have BC sent, I updated sons x2 in room The patient is critically ill with multiple organ systems failure and requires high complexity decision making for assessment and support, frequent evaluation and titration of therapies, application of advanced monitoring technologies and extensive interpretation of multiple databases.   Critical Care Time devoted to patient care services described in this note is 40 Minutes. This time reflects time of care of this signee: Merrie Roof, MD FACP. This critical care time does not reflect procedure time, or teaching time or supervisory time of PA/NP/Med student/Med Resident etc but could involve care discussion time. Rest per NP/medical resident whose note is outlined above and that I agree with   Lavon Paganini. Titus Mould, MD, [place] Pgr: [facility] 12/23/2016 12:22 PM

## 2016-12-23 NOTE — Progress Notes (Signed)
[facility] Attending Note   ADMISSION DATE:  12/16/2016  CONSULTATION DATE: 12/23/2016  REFERRING MD:  Berle Mull, M.D. / [facility]  CHIEF COMPLAINT: Acute Respiratory Failure  Presenting HPI:  66 y.o. female with long-term history of alcohol use. Stopped drinking alcohol approximately 2 months ago but had a binge approximately 2 weeks ago. Admitted for shortness of breath and gastric pain. Endoscopy was attempted by Dr. Michail Sermon with [facility]  but he was unable to pass the scope beyond the gastric mass seen. Patient was subsequently scheduled for surgery on 11/9 for evaluation of gastric mass and liberation of hiatal hernia. Critical care medicine was called to bedside 11/9 & found her in acute respiratory failure/distress. Patient was urgently intubated and NG tube was placed. Patient had foul-smelling purulent secretions obtained from her OG tube and from her endotracheal tube. Patient was subsequently transferred to the ICU for further stabilization and treatment.  Subjective:  Bedside nurse reports intermittent periods of agitation. Awaiting premedication before CT imaging.   Review of Systems:  Unable to obtain given intubation & sedation.   Vent Mode: PRVC FiO2 (%):  [40 %-100 %] 40 % Set Rate:  [16 bmp] 16 bmp Vt Set:  [450 mL-510 mL] 510 mL PEEP:  [5 cmH20] 5 cmH20 Plateau Pressure:  [29 cmH20] 29 cmH20  Temp:  [98.2 F (36.8 C)-99.9 F (37.7 C)] 99.9 F (37.7 C) (11/09 1605) Pulse Rate:  [54-140] 114 (11/09 1615) Resp:  [16-34] 16 (11/09 1615) BP: (78-149)/(30-110) 99/60 (11/09 1615) SpO2:  [87 %-100 %] 96 % (11/09 1615) FiO2 (%):  [40 %-100 %] 40 % (11/09 1501) Weight:  [203 lb 11.3 oz (92.4 kg)] 203 lb 11.3 oz (92.4 kg) (11/09 0439)  General:  Sons at bedside. Intubated. No distress. Integument:  Warm & dry. No rash on exposed skin. HEENT:  Moist mucus memebranes. No scleral icterus. Endotracheal tube in place. Neurological:  Pupils symmetric.  Sedated. No spontaneous movements. Pulmonary:  Symmetric chest wall rise on ventilator. Coarse breath sounds bilaterally. Cardiovascular:  Regular rate & rhythm. No appreciable JVD. Normal S1 & S2. Abdomen:  Soft. Protuberant. Normoactive bowel sounds.  LINES/TUBES: OETT 11/9 >>> RUE PICC 11/4 >>> Foley 11/9 >>> R NGT 11/9 >>> PIV  CBC Latest Ref Rng & Units 12/23/2016 12/22/2016 12/21/2016  WBC 4.0 - 10.5 K/uL 14.4(H) 10.2 9.7  Hemoglobin 12.0 - 15.0 g/dL 10.9(L) 9.5(L) 8.9(L)  Hematocrit 36.0 - 46.0 % 35.0(L) 30.1(L) 28.6(L)  Platelets 150 - 400 K/uL 481(H) 324 330   BMP Latest Ref Rng & Units 12/23/2016 12/22/2016 12/22/2016  Glucose 65 - 99 mg/dL 151(H) 191(H) 144(H)  BUN 6 - 20 mg/dL _0 Creatinine 0.44 - 1.00 mg/dL 0.65 0.62 0.57  Sodium 135 - 145 mmol/L 134(L) 132(L) 133(L)  Potassium 3.5 - 5.1 mmol/L 4.4 4.7 4.0  Chloride 101 - 111 mmol/L 100(L) 101 100(L)  CO2 22 - 32 mmol/L _1 Calcium 8.9 - 10.3 mg/dL 8.2(L) 7.9(L) 8.1(L)   Hepatic Function Latest Ref Rng & Units 12/22/2016 12/19/2016 12/18/2016  Total Protein 6.5 - 8.1 g/dL 5.5(L) 5.9(L) 5.3(L)  Albumin 3.5 - 5.0 g/dL 2.2(L) 2.2(L) 2.1(L)  AST 15 - 41 U/L 16 14(L) 14(L)  ALT 14 - 54 U/L 14 12(L) 12(L)  Alk Phosphatase 38 - 126 U/L 95 87 86  Total Bilirubin 0.3 - 1.2 mg/dL 0.5 0.6 0.7    IMAGING/STUDIES: CT ABDOMEN/PELVIS 11/3: IMPRESSION: 1. Inflammatory process in the upper abdomen,  appears centered on the stomach. There is a large hiatal hernia, which contains a large gastric filling defect, which may be a gastric mass, bezoar or less likely hematoma. This is in possible continuity with gastric diverticulum or perigastric fluid collection in the upper abdomen. There are separate perigastric fluid collections in the upper abdomen, with adjacent soft tissue stranding and inflammation. Despite the peri-gastric fluid collections there is no free air or extraluminal contrast. Underlying etiology is uncertain,  however considerations include penetrating gastric mass with associated abscesses. Peptic ulcer disease with intragastric hematoma and contained perforations are also considered. Alternative sources of left upper quadrant fluid collection such as pancreatitis with pseudocyst are felt less likely. Recommend GI consultation, possible endoscopic evaluation. 2. Heterogeneous 6.2 cm right renal mass, complex cyst versus solid lesion. Recommend nonemergent characterization with renal protocol MRI after resolution of acute event. CT CHEST W/O 11/6: IMPRESSION: 1. Large hiatal hernia with most of the stomach intrathoracic. Apparent low-attenuation mass in the intrathoracic stomach, better seen on previous study with oral contrast material. Additional low-attenuation lesions versus fluid collections in the upper abdomen also again demonstrated. Infiltration in the upper abdominal/epigastric fat suggesting inflammatory or infiltrative process. Similar appearance to previous study. 2. Small bilateral pleural effusions with basilar atelectasis or consolidation, mildly increased since previous study. 3. 3.5 cm diameter left thyroid gland mass. Due to size, consider ultrasound for further evaluation. PORT CXR 11/9:  Personally reviewed by me. PICC line and endotracheal tube in good position. Enteric feeding tube entering into the region of a large hiatal hernia. Silhouetting of left hemidiaphragm suggestive of either opacification or effusion noted. Cardiomegaly suggested. Mediastinal contour otherwise normal.  TTE 11/9 >>> CT CHESTABDOMEN/PELVIS W/ CONTRAST 11/9 >>>  MICROBIOLOGY: MRSA PCR 11/3:  Negative  Influenza A/B PCR 11/9:  Negative  Blood Cultures x2 11/9 >>> Urine Culture 11/9 >>> Tracheal Aspirate Culture 11/9 >>>  ANTIBIOTICS: Vancomycin 11/9 >>> Zosyn 11/9 >>>  SIGNIFICANT EVENTS: 11/02 - Admit 11/04 - EGD by Warnell Bureau but unable to pass scope beyond gastric mass 11/09 - Transferred to ICU  with acute respiratory failure and altered mentation  ASSESSMENT/PLAN:  66 y.o. female with acute hypoxic respiratory failure and acute encephalopathy. Likely has some element of aspiration given purulent secretions.  1. Acute encephalopathy: Likely multifactorial but strong component from hypoxia. Limiting sedatives.  2. Acute hypoxic respiratory failure: Continuing full ventilator support. Treatment for aspiration pneumonia. Continuous pulse oximetry monitoring. Ventilator weaning per protocol. 3. Aspiration pneumonia/HCAP: Tracheal aspirate pending. Continuing empiric vancomycin and Zosyn. Discontinuing droplet isolation. 4. Gastric mass: Stomach biopsy without malignancy. CT imaging pending. Surgery following. 5. Gastric outlet obstruction: TPN started. Management per surgery. 6. Anemia: No signs of active bleeding. Trending cell counts daily with CBC. 7. Essential hypertension: Monitoring vitals per unit protocol. 8. Chronic alcohol use: Thiamine & folic acid via TPN. Monitoring for signs of withdrawal. 9. Hyperglycemia: Continuing Accu-Cheks every 4 hours. Sliding-scale insulin per moderate algorithm. No history of diabetes mellitus. Checking hemoglobin A1c.  Prophylaxis:  SCDs & Protonix IV q12hr.  Diet:  NPO. Holding on tube feedings given gastric mass. TPN per pharmacy.  Code Status:  Full Code per previous physician discussions. Disposition:  Remains critically ill in the ICU. Family Update:  Sons updated at bedside this afternoon.  I have personally spent an additional total of 11 minutes of critical care time today caring for the patient, updating family at bedside, & reviewing the patient's electronic medical record.  Sonia Baller Ashok Cordia, M.D. [facility]  [facility] Pager:  [phone] After 7pm or if no response, call [phone] 5:19 PM 12/23/16

## 2016-12-23 NOTE — Care Management Note (Signed)
Case Management Note  Patient Details  Name: Nicole [name] MRN: [id number] Date of Birth: 21-Aug-1950  Subjective/Objective:   From home , presents with Large Gastric Mass, afib with RVR, hiatal hernia,  EGD showed no malignancy on bx, general and cardiothoracic surgery consulted, scheduled for surgery on Friday for Thoracotomy.  Conts on cardizem drip.                 Action/Plan: NCM will follow for dc needs.   Expected Discharge Date:  12/20/16               Expected Discharge Plan:     In-House Referral:     Discharge planning Services  CM Consult  Post Acute Care Choice:    Choice offered to:     DME Arranged:    DME Agency:     HH Arranged:    HH Agency:     Status of Service:  In process, will continue to follow  If discussed at Long Length of Stay Meetings, dates discussed:    Additional Comments: 12/23/2016 Pt is now s/p EGD with distress- intubated.  CM will continue to follow for discharge needs Maryclare Labrador, RN 12/23/2016, 3:41 PM

## 2016-12-23 NOTE — Progress Notes (Signed)
Entered Pt room to find patient having increased WOB with audible crackles/congestion. Paged Jeannette Corpus, NP at [phone] to request orders for Lasix. Pt required/ received Lasix on previous shift for the same problem . Orders received at [id number] for Lasix 40mg  IV push. Will continue to monitor patient closely.   [name], RN

## 2016-12-23 NOTE — Progress Notes (Signed)
[facility] Progress Note Patient Name: Burma Ketcher DOB: 08/16/1950 MRN: [id number]   Date of Service  12/23/2016  HPI/Events of Note  Afib with RVR. Pt was on Cardizem drip earlier today but it has been held  eICU Interventions  Restart Cardizem drip     Intervention Category Major Interventions: Arrhythmia - evaluation and management  Teddy Rebstock 12/23/2016, 8:01 PM

## 2016-12-24 ENCOUNTER — Inpatient Hospital Stay (HOSPITAL_COMMUNITY): Payer: Medicare Other

## 2016-12-24 ENCOUNTER — Encounter (HOSPITAL_COMMUNITY): Payer: Self-pay | Admitting: Radiology

## 2016-12-24 DIAGNOSIS — J9601 Acute respiratory failure with hypoxia: Secondary | ICD-10-CM

## 2016-12-24 LAB — BLOOD GAS, ARTERIAL
ACID-BASE DEFICIT: 0.4 mmol/L (ref 0.0–2.0)
BICARBONATE: 23.9 mmol/L (ref 20.0–28.0)
DRAWN BY: 414221
FIO2: 40
LHR: 16 {breaths}/min
O2 SAT: 97.2 %
PEEP/CPAP: 5 cmH2O
Patient temperature: 98.6
VT: 510 mL
pCO2 arterial: 40.3 mmHg (ref 32.0–48.0)
pH, Arterial: 7.391 (ref 7.350–7.450)
pO2, Arterial: 96.8 mmHg (ref 83.0–108.0)

## 2016-12-24 LAB — CBC WITH DIFFERENTIAL/PLATELET
Basophils Absolute: 0 10*3/uL (ref 0.0–0.1)
Basophils Relative: 0 %
EOS PCT: 0 %
Eosinophils Absolute: 0 10*3/uL (ref 0.0–0.7)
HCT: 23.7 % — ABNORMAL LOW (ref 36.0–46.0)
Hemoglobin: 7.4 g/dL — ABNORMAL LOW (ref 12.0–15.0)
LYMPHS ABS: 0.1 10*3/uL — AB (ref 0.7–4.0)
LYMPHS PCT: 2 %
MCH: 26.4 pg (ref 26.0–34.0)
MCHC: 31.2 g/dL (ref 30.0–36.0)
MCV: 84.6 fL (ref 78.0–100.0)
MONOS PCT: 5 %
Monocytes Absolute: 0.4 10*3/uL (ref 0.1–1.0)
Neutro Abs: 7.8 10*3/uL — ABNORMAL HIGH (ref 1.7–7.7)
Neutrophils Relative %: 93 %
PLATELETS: 251 10*3/uL (ref 150–400)
RBC: 2.8 MIL/uL — AB (ref 3.87–5.11)
RDW: 15.5 % (ref 11.5–15.5)
WBC: 8.4 10*3/uL (ref 4.0–10.5)

## 2016-12-24 LAB — COMPREHENSIVE METABOLIC PANEL
ALK PHOS: 90 U/L (ref 38–126)
ALT: 15 U/L (ref 14–54)
ANION GAP: 5 (ref 5–15)
AST: 15 U/L (ref 15–41)
Albumin: 1.6 g/dL — ABNORMAL LOW (ref 3.5–5.0)
BILIRUBIN TOTAL: 0.5 mg/dL (ref 0.3–1.2)
BUN: 30 mg/dL — ABNORMAL HIGH (ref 6–20)
CALCIUM: 7.4 mg/dL — AB (ref 8.9–10.3)
CO2: 24 mmol/L (ref 22–32)
Chloride: 104 mmol/L (ref 101–111)
Creatinine, Ser: 0.83 mg/dL (ref 0.44–1.00)
Glucose, Bld: 333 mg/dL — ABNORMAL HIGH (ref 65–99)
POTASSIUM: 4 mmol/L (ref 3.5–5.1)
Sodium: 133 mmol/L — ABNORMAL LOW (ref 135–145)
TOTAL PROTEIN: 4.5 g/dL — AB (ref 6.5–8.1)

## 2016-12-24 LAB — GLUCOSE, CAPILLARY
GLUCOSE-CAPILLARY: 148 mg/dL — AB (ref 65–99)
GLUCOSE-CAPILLARY: 210 mg/dL — AB (ref 65–99)
GLUCOSE-CAPILLARY: 268 mg/dL — AB (ref 65–99)
GLUCOSE-CAPILLARY: 297 mg/dL — AB (ref 65–99)
GLUCOSE-CAPILLARY: 313 mg/dL — AB (ref 65–99)
GLUCOSE-CAPILLARY: 332 mg/dL — AB (ref 65–99)
Glucose-Capillary: 317 mg/dL — ABNORMAL HIGH (ref 65–99)
Glucose-Capillary: 292 mg/dL — ABNORMAL HIGH (ref 65–99)

## 2016-12-24 LAB — URINE CULTURE: Culture: NO GROWTH

## 2016-12-24 LAB — LACTIC ACID, PLASMA: LACTIC ACID, VENOUS: 1.3 mmol/L (ref 0.5–1.9)

## 2016-12-24 LAB — MAGNESIUM: MAGNESIUM: 2.3 mg/dL (ref 1.7–2.4)

## 2016-12-24 LAB — PHOSPHORUS: Phosphorus: 3.3 mg/dL (ref 2.5–4.6)

## 2016-12-24 LAB — PROCALCITONIN: PROCALCITONIN: 0.13 ng/mL

## 2016-12-24 MED ORDER — INSULIN ASPART 100 UNIT/ML ~~LOC~~ SOLN
0.0000 [IU] | SUBCUTANEOUS | Status: DC
  Administered 2016-12-24: 4 [IU] via SUBCUTANEOUS
  Administered 2016-12-24: 11 [IU] via SUBCUTANEOUS
  Administered 2016-12-24: 3 [IU] via SUBCUTANEOUS
  Administered 2016-12-24: 7 [IU] via SUBCUTANEOUS
  Administered 2016-12-25: 4 [IU] via SUBCUTANEOUS
  Administered 2016-12-25: 7 [IU] via SUBCUTANEOUS
  Administered 2016-12-25 – 2016-12-26 (×5): 4 [IU] via SUBCUTANEOUS
  Administered 2016-12-26: 3 [IU] via SUBCUTANEOUS
  Administered 2016-12-26 – 2016-12-27 (×6): 4 [IU] via SUBCUTANEOUS
  Administered 2016-12-27: 3 [IU] via SUBCUTANEOUS
  Administered 2016-12-27: 4 [IU] via SUBCUTANEOUS
  Administered 2016-12-27: 3 [IU] via SUBCUTANEOUS

## 2016-12-24 MED ORDER — INSULIN GLARGINE 100 UNIT/ML ~~LOC~~ SOLN
10.0000 [IU] | SUBCUTANEOUS | Status: DC
  Administered 2016-12-24 – 2016-12-27 (×4): 10 [IU] via SUBCUTANEOUS
  Filled 2016-12-24 (×5): qty 0.1

## 2016-12-24 MED ORDER — TRACE MINERALS CR-CU-MN-SE-ZN 10-1000-500-60 MCG/ML IV SOLN
INTRAVENOUS | Status: AC
  Administered 2016-12-24: 18:00:00 via INTRAVENOUS
  Filled 2016-12-24: qty 864

## 2016-12-24 MED ORDER — IOPAMIDOL (ISOVUE-300) INJECTION 61%
INTRAVENOUS | Status: AC
  Administered 2016-12-24: 100 mL
  Filled 2016-12-24: qty 100

## 2016-12-24 NOTE — Progress Notes (Signed)
HR in 50s. Cardiology notified. No actions at this point necessary. Will continue to monitor

## 2016-12-24 NOTE — Progress Notes (Signed)
Progress Note  Patient Name: Nicole [name] Date of Encounter: 12/24/2016  Primary Cardiologist:   Dr. Debara Pickett  Subjective   Intubated.  Opens eyes and answers questions  Inpatient Medications    Scheduled Meds: . chlorhexidine gluconate (MEDLINE KIT)  15 mL Mouth Rinse BID  . insulin aspart  0-15 Units Subcutaneous Q6H  . levalbuterol  0.63 mg Inhalation Q6H  . mouth rinse  15 mL Mouth Rinse QID  . methylPREDNISolone (SOLU-MEDROL) injection  40 mg Intravenous Q6H  . metoprolol tartrate  5 mg Intravenous Q8H  . ondansetron  4 mg Oral Q8H  . pantoprazole  40 mg Intravenous Q12H  . sodium chloride flush  10-40 mL Intracatheter Q12H   Continuous Infusions: . sodium chloride 20 mL/hr at 12/24/16 0400  . amiodarone 30 mg/hr (12/24/16 0355)  . diltiazem (CARDIZEM) infusion    . fentaNYL infusion INTRAVENOUS 150 mcg/hr (12/24/16 0400)  . lactated ringers 125 mL/hr at 12/24/16 0257  . ondansetron [facility]) IV Stopped (12/24/16 2831)  . piperacillin-tazobactam (ZOSYN)  IV Stopped (12/24/16 1000)  . TPN ADULT (ION) 75 mL/hr at 12/24/16 0000  . TPN ADULT (ION)    . vancomycin 1,250 mg (12/24/16 0925)   PRN Meds: acetaminophen (TYLENOL) oral liquid 160 mg/5 mL, fentaNYL, midazolam, sodium chloride flush   Vital Signs    Vitals:   12/24/16 0700 12/24/16 0740 12/24/16 0904 12/24/16 0917  BP: 112/69     Pulse: 63 70  70  Resp: 17 (!) 24  (!) 24  Temp:   (!) 97.5 F (36.4 C)   TempSrc:   Oral   SpO2: 98%     Weight:      Height:        Intake/Output Summary (Last 24 hours) at 12/24/2016 1036 Last data filed at 12/24/2016 0700 Gross per 24 hour  Intake 4938.07 ml  Output 2125 ml  Net 2813.07 ml   Filed Weights   12/22/16 0305 12/23/16 0439 12/24/16 0400  Weight: 201 lb 4.5 oz (91.3 kg) 203 lb 11.3 oz (92.4 kg) 208 lb 8.9 oz (94.6 kg)    Telemetry    NSR - Personally Reviewed  ECG    NSR, rate 58, QT prolonged, axis WNL, no acute ST T wave changes.  - Personally  Reviewed  Physical Exam   GEN:  Intubated and on sedation Neck: No  JVD Cardiac: RRR, no murmurs, rubs, or gallops.  Respiratory: Clear  to auscultation bilaterally. GI: Soft, nontender, non-distended  MS: No  edema; No deformity. Neuro:  Nonfocal    Labs    Chemistry Recent Labs  Lab 12/19/16 0509  12/22/16 1415 12/23/16 0358 12/24/16 0653  NA 136   < > 132* 134* 133*  K 3.7   < > 4.7 4.4 4.0  CL 105   < > 101 100* 104  CO2 22   < > 24 23 24   GLUCOSE 155*   < > 191* 151* 333*  BUN 9   < > 18 20 30*  CREATININE 0.51   < > 0.62 0.65 0.83  CALCIUM 7.9*   < > 7.9* 8.2* 7.4*  PROT 5.9*  --  5.5*  --  4.5*  ALBUMIN 2.2*  --  2.2*  --  1.6*  AST 14*  --  16  --  15  ALT 12*  --  14  --  15  ALKPHOS 87  --  95  --  90  BILITOT 0.6  --  0.5  --  0.5  GFRNONAA >60   < > >60 >60 >60  GFRAA >60   < > >60 >60 >60  ANIONGAP 9   < > 7 11 5    < > = values in this interval not displayed.     Hematology Recent Labs  Lab 12/22/16 0502 12/23/16 0358 12/24/16 0653  WBC 10.2 14.4* 8.4  RBC 3.48* 4.04 2.80*  HGB 9.5* 10.9* 7.4*  HCT 30.1* 35.0* 23.7*  MCV 86.5 86.6 84.6  MCH 27.3 27.0 26.4  MCHC 31.6 31.1 31.2  RDW 15.8* 15.9* 15.5  PLT 324 481* 251    Cardiac Enzymes Recent Labs  Lab 12/21/16 2336 12/22/16 0502  TROPONINI <0.03 <0.03   No results for input(s): TROPIPOC in the last 168 hours.   BNPNo results for input(s): BNP, PROBNP in the last 168 hours.   DDimer No results for input(s): DDIMER in the last 168 hours.   Radiology    Ct Chest W Contrast  Result Date: 12/24/2016 CLINICAL DATA:  Ongoing epigastric abdominal pain for weeks. Pt has known gastric mass as well as hiatal hernia. Hx: HTN EXAM: CT CHEST, ABDOMEN, AND PELVIS WITH CONTRAST TECHNIQUE: Multidetector CT imaging of the chest, abdomen and pelvis was performed following the standard protocol during bolus administration of intravenous contrast. CONTRAST:  151m ISOVUE-300 IOPAMIDOL (ISOVUE-300)  INJECTION 61% COMPARISON:  12/17/2016 FINDINGS: CT CHEST FINDINGS Cardiovascular: Heart is mildly enlarged. Minor left coronary artery calcifications. Thoracic aorta is normal in caliber. Mild atherosclerotic calcifications noted along the arch and descending portion. Mediastinum/Nodes: Enlarged left thyroid lobe with evidence of poorly defined nodules. No neck base or axillary masses or enlarged lymph nodes. No mediastinal or hilar masses or pathologically enlarged lymph nodes. Endotracheal tube tip projects 2 cm above the chronic. Nasogastric tube passes into a large hiatal hernia. Trachea is widely patent. Lungs/Pleura: Complete atelectasis of the left lower lobe. Mild dependent atelectasis is noted in the left upper lobe and posterior base of the right lower lobe. Small left pleural effusion. Minimal right pleural effusion. No convincing pneumonia. No pulmonary edema. No pneumothorax. No lung mass or nodule. CT ABDOMEN PELVIS FINDINGS Hepatobiliary: Heterogeneous hypoattenuating area within segment 2 of the liver, measuring approximately 4.7 x 4.5 x 3.4 cm. No other liver mass or lesion. Liver shows central volume loss with mild surface nodularity, morphologic changes raising the possibility of cirrhosis. Gallbladder is unremarkable. No bile duct dilation. Pancreas: Cyst enlarges the pancreatic body and proximal tail. It measures 6.4 x 4.1 x 5.3 cm. No other pancreatic mass or lesion. Spleen: Borderline enlarged measuring 13.6 x 5.6 x 10.8 cm. No splenic mass or focal lesion. Adrenals/Urinary Tract: No adrenal masses. Heterogeneously enhancing mass arises from the midpole the right kidney containing a small central focus of calcification. The mass measures 5.8 x 4.8 x 5.7 cm. It appears contained within Gerota's fascia. No other renal masses. Widely patent renal veins. No hydronephrosis. Ureters are normal in course and in caliber. Bladder is decompressed with a Foley catheter. Stomach/Bowel: Moderate to large  hiatal hernia. Dense contrast is present dependently within the hernia. There is a homogeneous mass within the stomach that extends from the hiatal hernia to the mid stomach, which appears mural in location. Mass measures 11.4 x 7.1 x 8.2 cm and has average Hounsfield units of 47. It is stable from the prior study. There is a heterogeneous area along the gastric antrum without appears inflammatory. Small irregular fluid collections and patchy inflammatory changes are noted adjacent to the  gastric antrum, inferior margin of the lateral segment of the left liver lobe and adjacent to the superior pancreas. Small bowel is unremarkable. Colon is mostly decompressed. There scattered colonic diverticula. No diverticulitis or other colonic inflammatory process. Appendix not definitively seen. Vascular/Lymphatic: Vascular collateral in the left upper quadrant, specifically a splenule renal collateral. Aortic atherosclerosis extending to its branch vessels. No aneurysm. No pathologically enlarged lymph nodes. Reproductive: Uterus and bilateral adnexa are unremarkable. Other: Small amount ascites.  No abdominal wall hernia. MUSCULOSKELETAL FINDINGS No acute fracture or acute finding. Multiple old left-sided rib fractures. No osteoblastic or osteolytic lesions. IMPRESSION: CHEST CT 1. Complete atelectasis of the left lower lobe associated with a small pleural effusion. 2. Mild dependent atelectasis in the right lower lobe and left upper lobe. 3. No convincing pneumonia.  No pulmonary edema. 4. Mild cardiomegaly. ABDOMEN AND PELVIS CT 1. Inflammatory type changes are noted in the upper abdomen surrounding the gastric antrum and abutting the superior aspect of the pancreas and left liver lobe. There is a irregular area within the gastric antrum appears inflammatory. The inflammatory changes include small fluid collections. There is also a cyst within the pancreatic body and proximal tail. These inflammatory changes appear  improved compared to the prior CT scan. Pancreatic cyst has mildly decreased in size. Findings may reflect improved pancreatitis. The inflammatory changes may be due to stomach pathology. 2. Ill-defined hypoattenuating area in segment 2 of the liver. This is new from the prior CT. It is likely inflammatory in etiology. 3. 5.8 cm right renal mass consistent with a renal cell carcinoma. This appears contained within Gerota's fascia and there is no associated adenopathy or venous thrombosis. No evidence of metastatic disease. 4. Morphologic changes of the liver suggests cirrhosis, supported by a borderline splenomegaly and a spleen no renal venous collateral supporting portal venous hypertension. 5. Small amount of ascites, increased from the prior CT. 6. Moderate to large hiatal hernia, stable from the prior study. 7. Homogeneous gastric mass described previously is unchanged. It appears mural in location and may reflect a large leiomyoma. 8. Aortic atherosclerosis. Electronically Signed   By: Lajean Manes M.D.   On: 12/24/2016 09:24   Ct Abdomen Pelvis W Contrast  Result Date: 12/24/2016 CLINICAL DATA:  Ongoing epigastric abdominal pain for weeks. Pt has known gastric mass as well as hiatal hernia. Hx: HTN EXAM: CT CHEST, ABDOMEN, AND PELVIS WITH CONTRAST TECHNIQUE: Multidetector CT imaging of the chest, abdomen and pelvis was performed following the standard protocol during bolus administration of intravenous contrast. CONTRAST:  14m ISOVUE-300 IOPAMIDOL (ISOVUE-300) INJECTION 61% COMPARISON:  12/17/2016 FINDINGS: CT CHEST FINDINGS Cardiovascular: Heart is mildly enlarged. Minor left coronary artery calcifications. Thoracic aorta is normal in caliber. Mild atherosclerotic calcifications noted along the arch and descending portion. Mediastinum/Nodes: Enlarged left thyroid lobe with evidence of poorly defined nodules. No neck base or axillary masses or enlarged lymph nodes. No mediastinal or hilar masses or  pathologically enlarged lymph nodes. Endotracheal tube tip projects 2 cm above the chronic. Nasogastric tube passes into a large hiatal hernia. Trachea is widely patent. Lungs/Pleura: Complete atelectasis of the left lower lobe. Mild dependent atelectasis is noted in the left upper lobe and posterior base of the right lower lobe. Small left pleural effusion. Minimal right pleural effusion. No convincing pneumonia. No pulmonary edema. No pneumothorax. No lung mass or nodule. CT ABDOMEN PELVIS FINDINGS Hepatobiliary: Heterogeneous hypoattenuating area within segment 2 of the liver, measuring approximately 4.7 x 4.5 x 3.4 cm. No other  liver mass or lesion. Liver shows central volume loss with mild surface nodularity, morphologic changes raising the possibility of cirrhosis. Gallbladder is unremarkable. No bile duct dilation. Pancreas: Cyst enlarges the pancreatic body and proximal tail. It measures 6.4 x 4.1 x 5.3 cm. No other pancreatic mass or lesion. Spleen: Borderline enlarged measuring 13.6 x 5.6 x 10.8 cm. No splenic mass or focal lesion. Adrenals/Urinary Tract: No adrenal masses. Heterogeneously enhancing mass arises from the midpole the right kidney containing a small central focus of calcification. The mass measures 5.8 x 4.8 x 5.7 cm. It appears contained within Gerota's fascia. No other renal masses. Widely patent renal veins. No hydronephrosis. Ureters are normal in course and in caliber. Bladder is decompressed with a Foley catheter. Stomach/Bowel: Moderate to large hiatal hernia. Dense contrast is present dependently within the hernia. There is a homogeneous mass within the stomach that extends from the hiatal hernia to the mid stomach, which appears mural in location. Mass measures 11.4 x 7.1 x 8.2 cm and has average Hounsfield units of 47. It is stable from the prior study. There is a heterogeneous area along the gastric antrum without appears inflammatory. Small irregular fluid collections and patchy  inflammatory changes are noted adjacent to the gastric antrum, inferior margin of the lateral segment of the left liver lobe and adjacent to the superior pancreas. Small bowel is unremarkable. Colon is mostly decompressed. There scattered colonic diverticula. No diverticulitis or other colonic inflammatory process. Appendix not definitively seen. Vascular/Lymphatic: Vascular collateral in the left upper quadrant, specifically a splenule renal collateral. Aortic atherosclerosis extending to its branch vessels. No aneurysm. No pathologically enlarged lymph nodes. Reproductive: Uterus and bilateral adnexa are unremarkable. Other: Small amount ascites.  No abdominal wall hernia. MUSCULOSKELETAL FINDINGS No acute fracture or acute finding. Multiple old left-sided rib fractures. No osteoblastic or osteolytic lesions. IMPRESSION: CHEST CT 1. Complete atelectasis of the left lower lobe associated with a small pleural effusion. 2. Mild dependent atelectasis in the right lower lobe and left upper lobe. 3. No convincing pneumonia.  No pulmonary edema. 4. Mild cardiomegaly. ABDOMEN AND PELVIS CT 1. Inflammatory type changes are noted in the upper abdomen surrounding the gastric antrum and abutting the superior aspect of the pancreas and left liver lobe. There is a irregular area within the gastric antrum appears inflammatory. The inflammatory changes include small fluid collections. There is also a cyst within the pancreatic body and proximal tail. These inflammatory changes appear improved compared to the prior CT scan. Pancreatic cyst has mildly decreased in size. Findings may reflect improved pancreatitis. The inflammatory changes may be due to stomach pathology. 2. Ill-defined hypoattenuating area in segment 2 of the liver. This is new from the prior CT. It is likely inflammatory in etiology. 3. 5.8 cm right renal mass consistent with a renal cell carcinoma. This appears contained within Gerota's fascia and there is no  associated adenopathy or venous thrombosis. No evidence of metastatic disease. 4. Morphologic changes of the liver suggests cirrhosis, supported by a borderline splenomegaly and a spleen no renal venous collateral supporting portal venous hypertension. 5. Small amount of ascites, increased from the prior CT. 6. Moderate to large hiatal hernia, stable from the prior study. 7. Homogeneous gastric mass described previously is unchanged. It appears mural in location and may reflect a large leiomyoma. 8. Aortic atherosclerosis. Electronically Signed   By: Lajean Manes M.D.   On: 12/24/2016 09:24   Dg Chest Port 1 View  Result Date: 12/24/2016 CLINICAL DATA:  Respiratory failure EXAM: PORTABLE CHEST 1 VIEW COMPARISON:  12/23/2016 FINDINGS: Endotracheal tube has been advanced and is now at the carina. Recommend withdrawal of 3 cm. NG tube remains in the stomach. Elevated left hemidiaphragm unchanged with left lower lobe consolidation and small left effusion. Mild right lower lobe atelectasis unchanged Right arm PICC tip in the lower SVC unchanged IMPRESSION: Endotracheal tube at the carina, recommend withdrawal 3 cm Left lower lobe consolidation unchanged. These results will be called to the ordering clinician or representative by the Radiologist Assistant, and communication documented in the PACS or zVision Dashboard. Electronically Signed   By: Franchot Gallo M.D.   On: 12/24/2016 08:17   Dg Chest Port 1 View  Result Date: 12/23/2016 CLINICAL DATA:  Intubation. EXAM: PORTABLE CHEST 1 VIEW COMPARISON:  12/22/2016, 12/17/2016 and chest CT 12/20/2016 FINDINGS: Endotracheal tube has tip along the right tracheal wall 3.5 cm above the carina. Enteric tube courses into a large hiatal hernia with tip just below the diaphragm. Right-sided PICC line has tip over the SVC unchanged. Lungs are hypoinflated demonstrate opacification over the left mid to lower lung with slightly better aeration over the lateral left base.  Findings likely represent today left-sided effusion with atelectasis, although infection is possible. Right lung is clear. Mild stable cardiomegaly. Calcified plaque over the aortic arch. Known large hiatal hernia. Known multiple left posterolateral rib fractures. IMPRESSION: Persistent opacification over the left mid to lower lung likely effusion with atelectasis. Infection in the left base is possible. Stable cardiomegaly. Large hiatal hernia. Known multiple left posterolateral rib fractures. Tubes and lines as described. Electronically Signed   By: Marin Olp M.D.   On: 12/23/2016 10:14   Dg Chest Port 1 View  Result Date: 12/22/2016 CLINICAL DATA:  Shortness of breath and cough. EXAM: PORTABLE CHEST 1 VIEW COMPARISON:  12/17/2016 FINDINGS: Right-sided PICC line with tip in the right atrium. Lungs are hypoinflated with bibasilar opacification left-greater-than-right with possible slight interval progression. Findings may be due to bibasilar atelectasis and left-sided effusion, although infection is possible. Stable cardiomegaly. Known large hiatal hernia. Remainder of the exam is unchanged. IMPRESSION: Worsening bibasilar opacification left-greater-than-right likely bibasilar atelectasis and left effusion. Basilar infection is possible. Right-sided PICC line with tip over the right atrium. This could be pulled back 5-6 cm. Mild stable cardiomegaly. These results will be called to the ordering clinician or representative by the Radiologist Assistant, and communication documented in the PACS or zVision Dashboard. Electronically Signed   By: Marin Olp M.D.   On: 12/22/2016 14:19   Dg Abd Portable 1v  Result Date: 12/24/2016 CLINICAL DATA:  Reason for exam: encounter for nasogastric tube. Patient refused to relax right shoulder down, resulting in a more oblique image. EXAM: PORTABLE ABDOMEN - 1 VIEW COMPARISON:  12/23/2016 FINDINGS: Nasogastric tube tip projects just below the diaphragm, with the tip  and side-hole within stomach and hiatal hernia respectively. There is no bowel dilation to suggest obstruction or significant generalized adynamic ileus. IMPRESSION: Nasogastric tube lies within the stomach, primarily within the moderate to large hiatal hernia, tip just below the left hemidiaphragm. Electronically Signed   By: Lajean Manes M.D.   On: 12/24/2016 08:20   Dg Abd Portable 1v  Result Date: 12/23/2016 CLINICAL DATA:  Initial evaluation for feeding tube placement. EXAM: PORTABLE ABDOMEN - 1 VIEW COMPARISON:  Prior CT from 12/17/2016.  The FINDINGS: Enteric tube in place with tip in side hole overlying the left upper quadrant, side hole just above the left  hemidiaphragm. This likely lies within the stomach given previously seen large hiatal hernia on prior CT. Tip projects inferiorly. Bowel gas pattern is nonobstructive. Residual enteric contrast seen overlying the right lower quadrant. Advanced degenerative changes about the hips. Degenerative changes noted within lower lumbar spine. IMPRESSION: Tip and side hole of enteric tube overlying the left upper quadrant, likely within the stomach given previously seen large hiatal hernia. Tip projects inferiorly. Electronically Signed   By: Jeannine Boga M.D.   On: 12/23/2016 23:58   Dg Abd Portable 1v  Result Date: 12/22/2016 CLINICAL DATA:  Vomiting and periumbilical pain. EXAM: PORTABLE ABDOMEN - 1 VIEW COMPARISON:  CT of the abdomen and pelvis 12/17/2016 FINDINGS: There is paucity of bowel gas, limiting evaluation of the caliber of bowel loops. Visualized bowel gas pattern is nonobstructed. Contrast remains in the cecum and scattered colonic diverticula. There are degenerative changes in both hips. IMPRESSION: Nonobstructive bowel gas pattern. Residual contrast following recent CT exam. Electronically Signed   By: Nolon Nations M.D.   On: 12/22/2016 14:20    Cardiac Studies   Echo:  12/23/16  Study Conclusions  - Left ventricle: The  cavity size was normal. Wall thickness was   normal. Systolic function was normal. The estimated ejection   fraction was in the range of 55% to 60%. Wall motion was normal;   there were no regional wall motion abnormalities.   Patient Profile     66 y.o. female with PAF, gastric mass with abnormal EKG showing diffuse ST segment elevation.   Assessment & Plan    PAF:  Maintaining NSR on amiodarone IV.  Continue current drip.   ABNORMAL EKG:  No acute changes.  Significant baseline artifact evident on the EKG yesterday is not present today.  No evidence of acute ischemic event.    ACUTE RESPIRATORY FAILURE:  Per CCM.   Signed, Minus Breeding, MD  12/24/2016, 10:36 AM

## 2016-12-24 NOTE — Progress Notes (Signed)
RT retracted patient ETT 2 per MD order. No complications. Vital signs stable throughout. Patient tolerated fairly well. RT will continue to monitor.

## 2016-12-24 NOTE — Progress Notes (Signed)
Upon coming to the room OG was noted in Pts mouth, coiled. Pt appears anxious and restless, with asynchrony with vent. OG removed, pt suctioned.   After a while attempted to replace OG. Resistance  Felt. Due to pt's situation no more attempts were done.  VS stable at this time. Pt is agitated at times/continues trying to pull on the tube. Continue to monitor

## 2016-12-24 NOTE — Progress Notes (Signed)
Condition noted  Will reassess next week to determine surgical intervention timing

## 2016-12-24 NOTE — Progress Notes (Signed)
PHARMACY - ADULT TOTAL PARENTERAL NUTRITION CONSULT NOTE   Pharmacy Consult:  TPN Indication:  Prolonged NPO status / GOO  Patient Measurements: Height: 5\' 8"  (172.7 cm) Weight: 208 lb 8.9 oz (94.6 kg) IBW/kg (Calculated) : 63.9 TPN AdjBW (KG): 74.4 Body mass index is 31.71 kg/m.  Assessment:  26 YOF presented on 12/16/16 with N/V and essentially no PO intake x4 days.  CT shows gastric mass and EGD concerning for malignant gastric tumor.  Per Surgery, if GIST then consider Gleevec to shrink tumor and will need J-tube for EN; if adenocarcinoma then will need additional work-up.  While awaiting biopsy result to determine care, Pharmacy consulted to initiate TPN for nutritional support.  Noted patient has a history of EtOH use.  GI: Hx GERD, presented with hematemesis on Protonix + Pepcid. New large gastric body mass causing GOO and hiatal hernia. OR scheduled for 11/9 but has been postponed due to intubation and ICU admission. Plan for  laparotomy, partial vs. Total gastrectomy, possible thoracotomy LBM 11/8, +flatus.  Scheduled Zofran Endo: thyroid gland mass on CT.  No hx DM - CBGs became uncontrolled today (160-310s) on SSI. Just started on scheduled methlypred yesterday which could explain spike in CBGs. Insulin requirements in the past 24 hours: 27 units mSSI + 30 units regular insulin in the TPN.  Lytes: wnl today. CoCa 9.3 Renal: new renal mass. SCr stable, BUN WNL - UOP 0.9 ml/kg/hr, Net +7.7 L since admit. S/p lasix on 11/6-8. MD added LR at 135ml/hr yesterday (these are primarily for CT today and then will stop per CCM) Pulm: small pleural effusion on CT. RA>[place]. xopenex Cards: HTN - Afib with RVR on admit, no AC d/t hematemesis - BP ok, ST 120s, diltiazem gtt, lopressor IV prn Hepatobil: LFTs / tbili / TG WNL, INR 1.2 Neuro: EtOH - thiamine, folate, multivitamin in TPN.  PRN morphine/Ativan ID: afebrile, WBC up to 14.4 - not on abx Best Practices: SCDs TPN Access: PICC placed  12/18/16  TPN start date: 12/18/16  Nutritional Goals (RD rec on 11/9): Kcal: 7322-0254 Protein: > 132 g per day Fluid: per MD  Current Nutrition:  TPN   Plan: Decrease TPN to 60 ml/hr tonight (Change to Clinisol to concentrate TPN) This TPN provides 130 g AA, 173 g CHO, and 39 g of lipids, which adds up to 1,495 kCal and meets 100% of patient needs Lytes in TPN: increase K slightly, Cl/Acetate ratio 1:1 Daily multivitamin and trace elements in TPN Add thiamine 100mg  and folic acid 1mg  to TPN D/C famotidine 20mg  IV Q12h and add 40mg  into TPN Continue moderate SSI Q6H + continue 30 units of regular insulin in TPN Discussed with CCM, may consider adding Lantus outside the bag as elevated CBGs may be due to addition of Solumedrol F/U iron supplementation when possible F/U AM labs, plans for nutrition post-op  Elenor Quinones, PharmD, BCPS Clinical Pharmacist Pager [phone] 12/24/2016 7:25 AM

## 2016-12-24 NOTE — Progress Notes (Signed)
[facility] Attending Note   ADMISSION DATE:  12/16/2016  CONSULTATION DATE: 12/23/2016  REFERRING MD:  Berle Mull, M.D. / [facility]  CHIEF COMPLAINT: Acute Respiratory Failure  Presenting HPI:  66 y.o. female with long-term history of alcohol use. Stopped drinking alcohol approximately 2 months ago but had a binge approximately 2 weeks ago. Admitted for shortness of breath and gastric pain. Endoscopy was attempted by Dr. Michail Sermon with [facility]  but he was unable to pass the scope beyond the gastric mass seen. Patient was subsequently scheduled for surgery on 11/9 for evaluation of gastric mass and liberation of hiatal hernia. Critical care medicine was called to bedside 11/9 & found her in acute respiratory failure/distress. Patient was urgently intubated and NG tube was placed. Patient had foul-smelling purulent secretions obtained from her OG tube and from her endotracheal tube. Patient was subsequently transferred to the ICU for further stabilization and treatment.  Subjective: Arouses to verbal stimulation.  Hemodynamically stable on mechanical ventilatory support.  She is on amiodarone drip. Review of Systems:  Unable to obtain given intubation & sedation.   Vent Mode: PRVC FiO2 (%):  [40 %] 40 % Set Rate:  [16 bmp] 16 bmp Vt Set:  [510 mL] 510 mL PEEP:  [5 cmH20] 5 cmH20 Plateau Pressure:  [17 cmH20-21 cmH20] 18 cmH20  Temp:  [97.5 F (36.4 C)-101 F (38.3 C)] 97.5 F (36.4 C) (11/10 0904) Pulse Rate:  [54-143] 70 (11/10 0917) Resp:  [14-34] 24 (11/10 0917) BP: (78-155)/(30-106) 112/69 (11/10 0700) SpO2:  [91 %-100 %] 98 % (11/10 0700) FiO2 (%):  [40 %] 40 % (11/10 0918) Weight:  [208 lb 8.9 oz (94.6 kg)] 208 lb 8.9 oz (94.6 kg) (11/10 0400)  General: Elderly obese white female sedated on mechanical ventilatory support arouses to stimulation. HEENT: Endotracheal and nasogastric tubes in place. PSY: Sedated  neuro: Moves all extremities x4 when  stimulated CV: Heart sounds are regular rate and rhythm currently.  The paroxysmal atrial fibrillation sinus tachycardia noted has been placed on amiodarone drip. PULM: Coarse rhonchi bilaterally, decreased breath sounds in the bases. GI: Abdomen is tender decreased bowel sounds NG tube to drainage Extremities: warm/dry, 2+ edema  Skin: Warm and dry   LINES/TUBES: OETT 11/9 >>> RUE PICC 11/4 >>> Foley 11/9 >>> R NGT 11/9 >>> PIV  CBC Latest Ref Rng & Units 12/24/2016 12/23/2016 12/22/2016  WBC 4.0 - 10.5 K/uL 8.4 14.4(H) 10.2  Hemoglobin 12.0 - 15.0 g/dL 7.4(L) 10.9(L) 9.5(L)  Hematocrit 36.0 - 46.0 % 23.7(L) 35.0(L) 30.1(L)  Platelets 150 - 400 K/uL 251 481(H) 324   BMP Latest Ref Rng & Units 12/24/2016 12/23/2016 12/22/2016  Glucose 65 - 99 mg/dL 333(H) 151(H) 191(H)  BUN 6 - 20 mg/dL 30(H) 20 18  Creatinine 0.44 - 1.00 mg/dL 0.83 0.65 0.62  Sodium 135 - 145 mmol/L 133(L) 134(L) 132(L)  Potassium 3.5 - 5.1 mmol/L 4.0 4.4 4.7  Chloride 101 - 111 mmol/L 104 100(L) 101  CO2 22 - 32 mmol/L 24 23 24   Calcium 8.9 - 10.3 mg/dL 7.4(L) 8.2(L) 7.9(L)   Hepatic Function Latest Ref Rng & Units 12/24/2016 12/22/2016 12/19/2016  Total Protein 6.5 - 8.1 g/dL 4.5(L) 5.5(L) 5.9(L)  Albumin 3.5 - 5.0 g/dL 1.6(L) 2.2(L) 2.2(L)  AST 15 - 41 U/L 15 16 14(L)  ALT 14 - 54 U/L 15 14 12(L)  Alk Phosphatase 38 - 126 U/L 90 95 87  Total Bilirubin 0.3 - 1.2 mg/dL 0.5 0.5 0.6  IMAGING/STUDIES: CT ABDOMEN/PELVIS 11/3: IMPRESSION: 1. Inflammatory process in the upper abdomen, appears centered on the stomach. There is a large hiatal hernia, which contains a large gastric filling defect, which may be a gastric mass, bezoar or less likely hematoma. This is in possible continuity with gastric diverticulum or perigastric fluid collection in the upper abdomen. There are separate perigastric fluid collections in the upper abdomen, with adjacent soft tissue stranding and inflammation. Despite the peri-gastric  fluid collections there is no free air or extraluminal contrast. Underlying etiology is uncertain, however considerations include penetrating gastric mass with associated abscesses. Peptic ulcer disease with intragastric hematoma and contained perforations are also considered. Alternative sources of left upper quadrant fluid collection such as pancreatitis with pseudocyst are felt less likely. Recommend GI consultation, possible endoscopic evaluation. 2. Heterogeneous 6.2 cm right renal mass, complex cyst versus solid lesion. Recommend nonemergent characterization with renal protocol MRI after resolution of acute event. CT CHEST W/O 11/6: IMPRESSION: 1. Large hiatal hernia with most of the stomach intrathoracic. Apparent low-attenuation mass in the intrathoracic stomach, better seen on previous study with oral contrast material. Additional low-attenuation lesions versus fluid collections in the upper abdomen also again demonstrated. Infiltration in the upper abdominal/epigastric fat suggesting inflammatory or infiltrative process. Similar appearance to previous study. 2. Small bilateral pleural effusions with basilar atelectasis or consolidation, mildly increased since previous study. 3. 3.5 cm diameter left thyroid gland mass. Due to size, consider ultrasound for further evaluation. PORT CXR 11/10: Endotracheal tube needs to be withdrawn 2 cm.  Otherwise no significant change in portable chest x-ray TTE 11/9 >>> CT CHESTABDOMEN/PELVIS W/ CONTRAST 11/9 >>> as noted in the radiological section.  No new findings.  MICROBIOLOGY: MRSA PCR 11/3:  Negative  Influenza A/B PCR 11/9:  Negative  Blood Cultures x2 11/9 >>> Urine Culture 11/9 >>> Tracheal Aspirate Culture 11/9 >>>  ANTIBIOTICS: Vancomycin 11/9 >>> Zosyn 11/9 >>>  SIGNIFICANT EVENTS: 11/02 - Admit 11/04 - EGD by Warnell Bureau but unable to pass scope beyond gastric mass 11/09 - Transferred to ICU with acute respiratory failure and altered  mentation  ASSESSMENT/PLAN:  66 y.o. female with acute hypoxic respiratory failure and acute encephalopathy. Likely has some element of aspiration given purulent secretions.  1. Acute encephalopathy: Daily wakeup assessment.  Limit sedation as able.  Once a minute liberated from mechanical ventilatory support may be able to decrease sedation 2. Acute hypoxic respiratory failure: In the setting of aspiration during intubation.  Hiatal hernia with intrusion into the thorax. 3. Aspiration pneumonia/HCAP: Tracheal aspirate pending. Continuing empiric vancomycin and Zosyn.  4. Gastric mass: Stomach biopsy without malignancy. CT imaging in 2018 demonstrates a mass.  But with actual improvement in the size. Surgery following. 5. Gastric outlet obstruction: TPN started. Management per surgery. 6. Anemia: No signs of active bleeding. Trending cell counts daily with CBC. 7. Essential hypertension: Monitoring vitals per unit protocol. 8. Chronic alcohol use: Thiamine & folic acid via TPN. Monitoring for signs of withdrawal. 9. Hyperglycemia: Continuing Accu-Cheks every 4 hours. Sliding-scale insulin per moderate algorithm. No history of diabetes mellitus. Checking hemoglobin A1c. 10. We will trend lactic acid check pro-calcitonin to evaluate infectious component of the disease process.  Prophylaxis:  SCDs & Protonix IV q12hr.  Diet:  NPO. Holding on tube feedings given gastric mass. TPN per pharmacy.  Code Status:  Full Code per previous physician discussions. Disposition:  Remains critically ill in the ICU. Family Update:  Sons updated at bedside this afternoon.  App CCT 40  min  Richardson Landry Minor ACNP Maryanna Shape PCCM Pager [phone] till 1 pm If no answer page [phone] 12/24/2016, 9:42 AM  PCCM Attending Note:  Patient seen and examined with nurse practitioner. Note reviewed. No acute events overnight. Patient underwent CT imaging today.  Review of systems: Unable to obtain given intubation and  sedation.  Vent Mode: PRVC FiO2 (%):  [40 %] 40 % Set Rate:  [16 bmp] 16 bmp Vt Set:  [510 mL] 510 mL PEEP:  [5 cmH20] 5 cmH20 Plateau Pressure:  [9 GVS25-48 cmH20] 9 cmH20  Temp:  [97.5 F (36.4 C)-101 F (38.3 C)] 98.3 F (36.8 C) (11/10 1211) Pulse Rate:  [54-143] 56 (11/10 1200) Resp:  [14-34] 16 (11/10 1200) BP: (79-155)/(43-106) 117/66 (11/10 1200) SpO2:  [92 %-100 %] 97 % (11/10 1200) FiO2 (%):  [40 %] 40 % (11/10 1111) Weight:  [208 lb 8.9 oz (94.6 kg)] 208 lb 8.9 oz (94.6 kg) (11/10 0400)  Gen.: No distress. Sleeping until awoken. Integument: No rash. No bruising. Warm. Dry. Cardiovascular: Regular rate. No JVD appreciated. Pulmonary: Distant breath sounds. Clear with auscultation. Symmetric chest wall rise on ventilator. Neurological: Nods to questions. Attends to voice. Awakens easily.  CBC Latest Ref Rng & Units 12/24/2016 12/23/2016 12/22/2016  WBC 4.0 - 10.5 K/uL 8.4 14.4(H) 10.2  Hemoglobin 12.0 - 15.0 g/dL 7.4(L) 10.9(L) 9.5(L)  Hematocrit 36.0 - 46.0 % 23.7(L) 35.0(L) 30.1(L)  Platelets 150 - 400 K/uL 251 481(H) 324   BMP Latest Ref Rng & Units 12/24/2016 12/23/2016 12/22/2016  Glucose 65 - 99 mg/dL 333(H) 151(H) 191(H)  BUN 6 - 20 mg/dL 30(H) 20 18  Creatinine 0.44 - 1.00 mg/dL 0.83 0.65 0.62  Sodium 135 - 145 mmol/L 133(L) 134(L) 132(L)  Potassium 3.5 - 5.1 mmol/L 4.0 4.4 4.7  Chloride 101 - 111 mmol/L 104 100(L) 101  CO2 22 - 32 mmol/L 24 23 24   Calcium 8.9 - 10.3 mg/dL 7.4(L) 8.2(L) 7.9(L)   Hepatic Function Latest Ref Rng & Units 12/24/2016 12/22/2016 12/19/2016  Total Protein 6.5 - 8.1 g/dL 4.5(L) 5.5(L) 5.9(L)  Albumin 3.5 - 5.0 g/dL 1.6(L) 2.2(L) 2.2(L)  AST 15 - 41 U/L 15 16 14(L)  ALT 14 - 54 U/L 15 14 12(L)  Alk Phosphatase 38 - 126 U/L 90 95 87  Total Bilirubin 0.3 - 1.2 mg/dL 0.5 0.5 0.6   CT CHEST W/ CONTRAST 11/10:  Reviewed by me. LLL consolidation w/ adjacent effusion. Endotracheal tube at [place].  A/P:  66 y.o. female with acute hypoxic  respiratory failure and acute encephalopathy.  1. Acute hypoxic respiratory failure: Secondary to aspiration. Continuing full ventilator support. 2. Aspiration pneumonia: Awaiting finalization of cultures. Continuing empiric Zosyn and vancomycin. 3. Acute encephalopathy: Improving. Attempting to limit sedation. 4. Gastric mass: Management as per general surgery. 5. Hyperglycemia: Starting Lantus 10 units subcutaneous daily. Increasing to resistant sliding scale insulin with Accu-Cheks every 4 hours.  I have spent a total of 32 minutes of critical care time today caring for the patient and reviewing the patient's electronic medical record.   Sonia Baller Ashok Cordia, M.D. [facility] Pager:  [phone] After 7pm or if no response, call [phone] 12:36 PM 12/24/16

## 2016-12-25 ENCOUNTER — Inpatient Hospital Stay (HOSPITAL_COMMUNITY): Payer: Medicare Other

## 2016-12-25 LAB — BASIC METABOLIC PANEL
Anion gap: 6 (ref 5–15)
BUN: 36 mg/dL — ABNORMAL HIGH (ref 6–20)
CHLORIDE: 108 mmol/L (ref 101–111)
CO2: 22 mmol/L (ref 22–32)
CREATININE: 0.92 mg/dL (ref 0.44–1.00)
Calcium: 7.9 mg/dL — ABNORMAL LOW (ref 8.9–10.3)
GFR calc Af Amer: >60 mL/min (ref >60)
GFR calc non Af Amer: >60 mL/min (ref >60)
Glucose, Bld: 183 mg/dL — ABNORMAL HIGH (ref 65–99)
Potassium: 4.3 mmol/L (ref 3.5–5.1)
SODIUM: 136 mmol/L (ref 135–145)

## 2016-12-25 LAB — CBC WITH DIFFERENTIAL/PLATELET
Basophils Absolute: 0 10*3/uL (ref 0.0–0.1)
Basophils Relative: 0 %
EOS PCT: 0 %
Eosinophils Absolute: 0 10*3/uL (ref 0.0–0.7)
HCT: 27 % — ABNORMAL LOW (ref 36.0–46.0)
HEMOGLOBIN: 8.6 g/dL — AB (ref 12.0–15.0)
LYMPHS ABS: 0.6 10*3/uL — AB (ref 0.7–4.0)
LYMPHS PCT: 4 %
MCH: 27.3 pg (ref 26.0–34.0)
MCHC: 31.9 g/dL (ref 30.0–36.0)
MCV: 85.7 fL (ref 78.0–100.0)
Monocytes Absolute: 0.3 10*3/uL (ref 0.1–1.0)
Monocytes Relative: 2 %
NEUTROS PCT: 94 %
Neutro Abs: 12.5 10*3/uL — ABNORMAL HIGH (ref 1.7–7.7)
Platelets: 300 10*3/uL (ref 150–400)
RBC: 3.15 MIL/uL — AB (ref 3.87–5.11)
RDW: 15.8 % — ABNORMAL HIGH (ref 11.5–15.5)
WBC: 13.3 10*3/uL — AB (ref 4.0–10.5)

## 2016-12-25 LAB — GLUCOSE, CAPILLARY
GLUCOSE-CAPILLARY: 156 mg/dL — AB (ref 65–99)
GLUCOSE-CAPILLARY: 166 mg/dL — AB (ref 65–99)
GLUCOSE-CAPILLARY: 203 mg/dL — AB (ref 65–99)
Glucose-Capillary: 175 mg/dL — ABNORMAL HIGH (ref 65–99)
Glucose-Capillary: 199 mg/dL — ABNORMAL HIGH (ref 65–99)

## 2016-12-25 LAB — BLOOD GAS, ARTERIAL
Acid-base deficit: 1.2 mmol/L (ref 0.0–2.0)
Bicarbonate: 23.3 mmol/L (ref 20.0–28.0)
Drawn by: 330991
FIO2: 40
LHR: 16 {breaths}/min
O2 SAT: 97.7 %
PATIENT TEMPERATURE: 98.6
PCO2 ART: 40.8 mmHg (ref 32.0–48.0)
PEEP: 5 cmH2O
VT: 510 mL
pH, Arterial: 7.374 (ref 7.350–7.450)
pO2, Arterial: 107 mmHg (ref 83.0–108.0)

## 2016-12-25 LAB — CULTURE, RESPIRATORY W GRAM STAIN

## 2016-12-25 LAB — CULTURE, RESPIRATORY: SPECIAL REQUESTS: NORMAL

## 2016-12-25 LAB — RENAL FUNCTION PANEL
ANION GAP: 7 (ref 5–15)
Albumin: 1.8 g/dL — ABNORMAL LOW (ref 3.5–5.0)
BUN: 34 mg/dL — ABNORMAL HIGH (ref 6–20)
CHLORIDE: 108 mmol/L (ref 101–111)
CO2: 23 mmol/L (ref 22–32)
Calcium: 8 mg/dL — ABNORMAL LOW (ref 8.9–10.3)
Creatinine, Ser: 0.87 mg/dL (ref 0.44–1.00)
GFR calc non Af Amer: >60 mL/min (ref >60)
Glucose, Bld: 140 mg/dL — ABNORMAL HIGH (ref 65–99)
POTASSIUM: 5 mmol/L (ref 3.5–5.1)
Phosphorus: 3.7 mg/dL (ref 2.5–4.6)
Sodium: 138 mmol/L (ref 135–145)

## 2016-12-25 LAB — PROCALCITONIN: Procalcitonin: 0.27 ng/mL

## 2016-12-25 LAB — MAGNESIUM: Magnesium: 2.6 mg/dL — ABNORMAL HIGH (ref 1.7–2.4)

## 2016-12-25 MED ORDER — DEXMEDETOMIDINE HCL IN NACL 400 MCG/100ML IV SOLN
0.4000 ug/kg/h | INTRAVENOUS | Status: DC
  Administered 2016-12-25 (×2): 0.6 ug/kg/h via INTRAVENOUS
  Administered 2016-12-26: 0.5 ug/kg/h via INTRAVENOUS
  Administered 2016-12-26: 0.599 ug/kg/h via INTRAVENOUS
  Administered 2016-12-26: 0.6 ug/kg/h via INTRAVENOUS
  Administered 2016-12-27: 0.4 ug/kg/h via INTRAVENOUS
  Administered 2016-12-27: 0.5 ug/kg/h via INTRAVENOUS
  Administered 2016-12-27: 0.6 ug/kg/h via INTRAVENOUS
  Filled 2016-12-25 (×3): qty 100
  Filled 2016-12-25: qty 200
  Filled 2016-12-25 (×3): qty 100

## 2016-12-25 MED ORDER — TRACE MINERALS CR-CU-MN-SE-ZN 10-1000-500-60 MCG/ML IV SOLN: INTRAVENOUS | Status: DC

## 2016-12-25 MED ORDER — SODIUM CHLORIDE 0.9 % IV SOLN
0.4000 ug/kg/h | INTRAVENOUS | Status: DC
  Administered 2016-12-25: 0.4 ug/kg/h via INTRAVENOUS
  Filled 2016-12-25: qty 2

## 2016-12-25 MED ORDER — TRACE MINERALS CR-CU-MN-SE-ZN 10-1000-500-60 MCG/ML IV SOLN
INTRAVENOUS | Status: AC
  Administered 2016-12-25: 18:00:00 via INTRAVENOUS
  Filled 2016-12-25: qty 864

## 2016-12-25 MED ORDER — METHYLPREDNISOLONE SODIUM SUCC 40 MG IJ SOLR
40.0000 mg | Freq: Two times a day (BID) | INTRAMUSCULAR | Status: DC
  Administered 2016-12-25 – 2016-12-27 (×4): 40 mg via INTRAVENOUS
  Filled 2016-12-25 (×4): qty 1

## 2016-12-25 NOTE — Progress Notes (Signed)
[facility] Attending Note   ADMISSION DATE:  12/16/2016  CONSULTATION DATE: 12/23/2016  REFERRING MD:  Berle Mull, M.D. / [facility]  CHIEF COMPLAINT: Acute Respiratory Failure  Presenting HPI:  66 y.o. female with long-term history of alcohol use. Stopped drinking alcohol approximately 2 months ago but had a binge approximately 2 weeks ago. Admitted for shortness of breath and gastric pain. Endoscopy was attempted by Dr. Michail Sermon with [facility]  but he was unable to pass the scope beyond the gastric mass seen. Patient was subsequently scheduled for surgery on 11/9 for evaluation of gastric mass and liberation of hiatal hernia. Critical care medicine was called to bedside 11/9 & found her in acute respiratory failure/distress. Patient was urgently intubated and NG tube was placed. Patient had foul-smelling purulent secretions obtained from her OG tube and from her endotracheal tube. Patient was subsequently transferred to the ICU for further stabilization and treatment.  Subjective:  Patient more agitated this morning. Not tolerating spontaneous breathing trial due to agitation.Patient with reported bradycardia after receiving IV Lopressor. Patient nodding her head that she does not feel she is getting enough air through ventilator.  Review of Systems:  Unable to obtain given intubation and agitation.   Vent Mode: PRVC FiO2 (%):  [40 %] 40 % Set Rate:  [16 bmp] 16 bmp Vt Set:  [510 mL] 510 mL PEEP:  [5 cmH20] 5 cmH20 Plateau Pressure:  [9 cmH20-18 cmH20] 14 cmH20  Temp:  [97.5 F (36.4 C)-98.9 F (37.2 C)] 98.4 F (36.9 C) (11/11 0734) Pulse Rate:  [52-98] 84 (11/11 0734) Resp:  [13-24] 21 (11/11 0734) BP: (101-140)/(48-82) 136/75 (11/11 0734) SpO2:  [96 %-100 %] 100 % (11/11 0734) FiO2 (%):  [40 %] 40 % (11/11 0734) Weight:  [216 lb 4.3 oz (98.1 kg)] 216 lb 4.3 oz (98.1 kg) (11/11 0445)  Gen.: Obviously uncomfortable. No family at bedside. Awake. Integument:  Warm. Dry. No rash appreciated. Neurological: Moving all 4 extremities. Nods to questions and originally. It tends to voice. Cardiovascular: Regular rate. Regular rhythm. Sinus rhythm on telemetry. No JVD appreciated. Pulmonary: Coarse breath sounds bilaterally. Symmetric chest wall expansion on ventilator. Abdomen: Soft. Normal bowel sounds. HEENT: Moist mucous membranes. No scleral icterus. Endotracheal tube in place.  LINES/TUBES: OETT 11/9 >>> RUE PICC 11/4 >>> Foley 11/9 >>> PIV  CBC Latest Ref Rng & Units 12/25/2016 12/24/2016 12/23/2016  WBC 4.0 - 10.5 K/uL 13.3(H) 8.4 14.4(H)  Hemoglobin 12.0 - 15.0 g/dL 8.6(L) 7.4(L) 10.9(L)  Hematocrit 36.0 - 46.0 % 27.0(L) 23.7(L) 35.0(L)  Platelets 150 - 400 K/uL 300 251 481(H)   BMP Latest Ref Rng & Units 12/25/2016 12/24/2016 12/23/2016  Glucose 65 - 99 mg/dL 140(H) 333(H) 151(H)  BUN 6 - 20 mg/dL 34(H) 30(H) 20  Creatinine 0.44 - 1.00 mg/dL 0.87 0.83 0.65  Sodium 135 - 145 mmol/L 138 133(L) 134(L)  Potassium 3.5 - 5.1 mmol/L 5.0 4.0 4.4  Chloride 101 - 111 mmol/L 108 104 100(L)  CO2 22 - 32 mmol/L _0 Calcium 8.9 - 10.3 mg/dL 8.0(L) 7.4(L) 8.2(L)   Hepatic Function Latest Ref Rng & Units 12/25/2016 12/24/2016 12/22/2016  Total Protein 6.5 - 8.1 g/dL - 4.5(L) 5.5(L)  Albumin 3.5 - 5.0 g/dL 1.8(L) 1.6(L) 2.2(L)  AST 15 - 41 U/L - 15 16  ALT 14 - 54 U/L - 15 14  Alk Phosphatase 38 - 126 U/L - 90 95  Total Bilirubin 0.3 - 1.2 mg/dL - 0.5 0.5  IMAGING/STUDIES: CT ABDOMEN/PELVIS 11/3: IMPRESSION: 1. Inflammatory process in the upper abdomen, appears centered on the stomach. There is a large hiatal hernia, which contains a large gastric filling defect, which may be a gastric mass, bezoar or less likely hematoma. This is in possible continuity with gastric diverticulum or perigastric fluid collection in the upper abdomen. There are separate perigastric fluid collections in the upper abdomen, with adjacent soft tissue stranding and  inflammation. Despite the peri-gastric fluid collections there is no free air or extraluminal contrast. Underlying etiology is uncertain, however considerations include penetrating gastric mass with associated abscesses. Peptic ulcer disease with intragastric hematoma and contained perforations are also considered. Alternative sources of left upper quadrant fluid collection such as pancreatitis with pseudocyst are felt less likely. Recommend GI consultation, possible endoscopic evaluation. 2. Heterogeneous 6.2 cm right renal mass, complex cyst versus solid lesion. Recommend nonemergent characterization with renal protocol MRI after resolution of acute event. CT CHEST W/O 11/6: IMPRESSION: 1. Large hiatal hernia with most of the stomach intrathoracic. Apparent low-attenuation mass in the intrathoracic stomach, better seen on previous study with oral contrast material. Additional low-attenuation lesions versus fluid collections in the upper abdomen also again demonstrated. Infiltration in the upper abdominal/epigastric fat suggesting inflammatory or infiltrative process. Similar appearance to previous study. 2. Small bilateral pleural effusions with basilar atelectasis or consolidation, mildly increased since previous study. 3. 3.5 cm diameter left thyroid gland mass. Due to size, consider ultrasound for further evaluation. PORT CXR 11/10: Endotracheal tube needs to be withdrawn 2 cm.  Otherwise no significant change in portable chest x-ray TTE 11/9:  LV normal in size with EF 55-60%. Normal wall motion. Unable to assess diastolic function due to atrial fibrillation. LA & RA normal in size. RV normal in size and function. No aortic stenosis or regurgitation. Aortic root normal in size. No mitral stenosis or regurgitation. Trivial pulmonic regurgitation. Trivial tricuspid regurgitation. No pericardial effusion. CT CHESTABDOMEN/PELVIS W/ CONTRAST 11/10: IMPRESSION: 1. Complete atelectasis of the left lower  lobe associated with a small pleural effusion.  2. Mild dependent atelectasis in the right lower lobe and left upper lobe. 3. No convincing pneumonia.  No pulmonary edema. 4. Mild cardiomegaly. 5. Inflammatory type changes are noted in the upper abdomen surrounding the gastric antrum and abutting the superior aspect of the pancreas and left liver lobe. There is a irregular area within the gastric antrum appears inflammatory. The inflammatory changes include small fluid collections. There is also a cyst within the pancreatic body and proximal tail. These inflammatory changes appear improved compared to the prior CT scan. Pancreatic cyst has mildly decreased in size. Findings may reflect improved pancreatitis. The inflammatory changes may be due to stomach pathology. 6. Ill-defined hypoattenuating area in segment 2 of the liver. This is new from the prior CT. It is likely inflammatory in etiology. 7. 5.8 cm right renal mass consistent with a renal cell carcinoma. This appears contained within Gerota's fascia and there is no associated adenopathy or venous thrombosis. No evidence of metastatic disease. 8. Morphologic changes of the liver suggests cirrhosis, supported by a borderline splenomegaly and a spleen no renal venous collateral supporting portal venous hypertension. 9. Small amount of ascites, increased from the prior CT. 10. Moderate to large hiatal hernia, stable from the prior study. 11. Homogeneous gastric mass described previously is unchanged. It appears mural in location and may reflect a large leiomyoma. 12. Aortic atherosclerosis. PORT CXR 11/11:  Personally reviewed by me. No new focal opacity. Silhouetting of left  hemidiaphragm persists. Globular appearing heart. Endotracheal tube in good position. Right upper extremity PICC line in good position. Enteric feeding tube removed.  MICROBIOLOGY: MRSA PCR 11/3:  Negative  Influenza A/B PCR 11/9:  Negative  Blood Cultures x2 11/9 >>> Urine  Culture 11/9:  Negative  Tracheal Aspirate Culture 11/9 >>>  ANTIBIOTICS: Vancomycin 11/9 >>> Zosyn 11/9 >>>  SIGNIFICANT EVENTS: 11/02 - Admit 11/04 - EGD by Warnell Bureau but unable to pass scope beyond gastric mass 11/09 - Transferred to ICU with acute respiratory failure and altered mentation 11/11 - Started Precedex for agitation. Lopressor stopped for bradycardia w/ pt currently in NSR.  ASSESSMENT/PLAN:  66 y.o. female with acute hypoxic respiratory failure and encephalopathy. Patient with ongoing agitation which may be due in part to alcohol withdrawal. Known chronic alcohol use. Likely aspiration pneumonia given purulent secretions.   1. Acute encephalopathy: Continuing thiamine in TPN. Switching to Precedex infusion to help limit sedation.  2. Acute hypoxic respiratory failure: Continuing daily spontaneous breathing trial as tolerated and as agitation/encephalopathy allows. Continuous pulse oximetry monitoring.  3. Aspiration pneumonia: Awaiting finalization of culture data. Continuing empiric vancomycin and Zosyn. 4. Paroxysmal atrial fibrillation: Converted to normal sinus rhythm. Management per cardiology. Continuing amiodarone drip. Holding Lopressor given bradycardia. Continuous telemetry monitoring.  5. Gastric mass: Stomach biopsy inconclusive. CT imaging with mass present. Management per surgery service. 6. Gastric outlet obstruction: Continuing TPN. Management per general surgery. 7. Essential hypertension: Currently normotensive. Monitoring vitals. Protocol. Holding Lopressor. 8. Chronic alcohol use: Continuing thiamine and folic acid via TPN. Monitoring for signs of withdrawal. Starting Precedex infusion. 9. Hyperglycemia: No history of diabetes mellitus. Glucose controlled. A1c 6.2. Continuing Lantus & sliding scale insulin with Accu-Cheks every 4 hours. 10. Anemia: No signs of active bleeding. Trending cell counts daily with CBC.  Prophylaxis:  SCDs & Protonix IV q12hr.   Diet:  NPO. Holding on tube feedings given gastric mass. TPN per pharmacy.  Code Status:  Full Code per previous physician discussions. Disposition:  Remains critically ill in the ICU. Family Update:  Son's updated on 11/9 at bedside by Dr. Ashok Cordia.  I have spent a total of 31 minutes of critical care time today caring for the patient and reviewing the patient's electronic medical record.   Sonia Baller Ashok Cordia, M.D. [facility] Pulmonary & Critical Care Pager:  [phone] After 7pm or if no response, call [phone] 12/25/2016, 8:17 AM

## 2016-12-25 NOTE — Progress Notes (Signed)
PHARMACY - ADULT TOTAL PARENTERAL NUTRITION CONSULT NOTE   Pharmacy Consult:  TPN Indication:  Prolonged NPO status / GOO  Patient Measurements: Height: 5\' 8"  (172.7 cm) Weight: 216 lb 4.3 oz (98.1 kg) IBW/kg (Calculated) : 63.9 TPN AdjBW (KG): 74.4 Body mass index is 32.88 kg/m.  Assessment:  72 YOF presented on 12/16/16 with N/V and essentially no PO intake x4 days.  CT shows gastric mass and EGD concerning for malignant gastric tumor.  Per Surgery, if GIST then consider Gleevec to shrink tumor and will need J-tube for EN; if adenocarcinoma then will need additional work-up.  While awaiting biopsy result to determine care, Pharmacy consulted to initiate TPN for nutritional support.  Noted patient has a history of EtOH use.  GI: Hx GERD, presented with hematemesis on Protonix + Pepcid. New large gastric body mass causing GOO and hiatal hernia. OR scheduled for 11/9 but has been postponed due to intubation and ICU admission. Plan for  laparotomy, partial vs. Total gastrectomy, possible thoracotomy LBM 11/8, +flatus.  Scheduled Zofran Endo: thyroid gland mass on CT.  No hx DM - CBGs became uncontrolled yesterday (Solumedrol was added around the same time) Now CBGs dropping down to mid 100s after new TPN started (lower dextrose concentration and Lantus 10 was added)  Insulin requirements in the past 24 hours: 40 units rSSI + Lantus 10 units + 30 units regular insulin in the TPN.  Lytes: wnl today. K trending up to 5.0 today? Rechecked later and was ok at 4.3. CoCa 9.6. Phos ok and Mg borderline high at 2.6. Renal: new renal mass. SCr stable, UOP decreased to 0.5 ml/kg/hr, Net +11 L since admit (+3.7 L yesterday). S/p lasix on 11/6-8. MD added LR at 13ml/hr yesterday primarily for fluids around CT, now stopped. BUN trending up to 34 (may need to decrease protein if continues to rise) Pulm: small pleural effusion on CT. RA>[facility]. xopenex Cards: HTN - Afib with RVR on admit, no AC d/t hematemesis - BP  ok, ST 120s, diltiazem gtt, lopressor IV prn Hepatobil: LFTs / tbili / TG WNL, INR 1.2 Neuro: EtOH - thiamine, folate, multivitamin in TPN.  PRN morphine/Ativan ID: afebrile, WBC up to 13.3 - not on abx Best Practices: SCDs TPN Access: PICC placed 12/18/16  TPN start date: 12/18/16  Nutritional Goals (RD rec on 11/9): Kcal: 0175-1025 Protein: > 132 g per day Fluid: per MD  Current Nutrition:  NPO TPN   Plan: Continue TPN at 60 ml/hr tonight This TPN provides 130 g AA (Clinisol), 173 g CHO, and 39 g of lipids, which adds up to 1,495 kCal and meets 100% of patient needs Lytes in TPN: decrease K slightly, decrease Mg, change EN:IDPOEUM to 1:2 Daily multivitamin and trace elements in TPN Add thiamine 100mg  and folic acid 1mg  to TPN D/C famotidine 20mg  IV Q12h and add 40mg  into TPN Continue moderate SSI Q6H + continue 30 units of regular insulin in TPN Monitor TPN labs F/U plans for nutrition post-op, iron supplementation when possible   Elenor Quinones, PharmD, BCPS Clinical Pharmacist Pager [phone] 12/25/2016 7:27 AM

## 2016-12-25 NOTE — Progress Notes (Signed)
Progress Note  Patient Name: Nicole [name] Date of Encounter: 12/25/2016  Primary Cardiologist:   Dr. Debara Pickett  Subjective   Intubated and responds to questions. Denies pain  Inpatient Medications    Scheduled Meds: . chlorhexidine gluconate (MEDLINE KIT)  15 mL Mouth Rinse BID  . insulin aspart  0-20 Units Subcutaneous Q4H  . insulin glargine  10 Units Subcutaneous Q24H  . levalbuterol  0.63 mg Inhalation Q6H  . mouth rinse  15 mL Mouth Rinse QID  . methylPREDNISolone (SOLU-MEDROL) injection  40 mg Intravenous Q12H  . ondansetron  4 mg Oral Q8H  . pantoprazole  40 mg Intravenous Q12H  . sodium chloride flush  10-40 mL Intracatheter Q12H   Continuous Infusions: . sodium chloride 10 mL/hr at 12/25/16 0300  . amiodarone 30 mg/hr (12/25/16 1251)  . dexmedetomidine (PRECEDEX) IV infusion    . diltiazem (CARDIZEM) infusion    . fentaNYL infusion INTRAVENOUS 200 mcg/hr (12/25/16 0435)  . ondansetron (ZOFRAN) IV Stopped (12/25/16 1615)  . piperacillin-tazobactam (ZOSYN)  IV 3.375 g (12/25/16 1400)  . TPN ADULT (ION) 60 mL/hr at 12/24/16 1812  . TPN ADULT (ION)    . vancomycin Stopped (12/25/16 1305)   PRN Meds: acetaminophen (TYLENOL) oral liquid 160 mg/5 mL, fentaNYL, midazolam, sodium chloride flush   Vital Signs    Vitals:   12/25/16 1106 12/25/16 1143 12/25/16 1345 12/25/16 1520  BP: 108/61   137/75  Pulse: 60   67  Resp: 16   18  Temp:  98.5 F (36.9 C)    TempSrc:  Oral    SpO2: 97%  99% 99%  Weight:      Height:        Intake/Output Summary (Last 24 hours) at 12/25/2016 1622 Last data filed at 12/25/2016 1000 Gross per 24 hour  Intake 2450.13 ml  Output 835 ml  Net 1615.13 ml   Filed Weights   12/23/16 0439 12/24/16 0400 12/25/16 0445  Weight: 203 lb 11.3 oz (92.4 kg) 208 lb 8.9 oz (94.6 kg) 216 lb 4.3 oz (98.1 kg)    Telemetry    NSR - Personally Reviewed  ECG    NSR, rate 58, QT prolonged, axis WNL, no acute ST T wave changes.  - Personally  Reviewed  Physical Exam   GENERAL:   Chronically ill appearing.   NECK:  No jugular venous distention, waveform within normal limits, carotid upstroke brisk and symmetric, no bruits, no thyromegaly LUNGS:  Clear to auscultation bilaterally HEART:  PMI not displaced or sustained,S1 and S2 within normal limits, no S3, no S4, no clicks, no rubs, no murmurs ABD:  Flat, positive bowel sounds normal in frequency in pitch, no bruits, no rebound, no guarding, no midline pulsatile mass, no hepatomegaly, no splenomegaly EXT:  2 plus pulses throughout, no edema, no cyanosis no clubbing   Labs    Chemistry Recent Labs  Lab 12/19/16 0509  12/22/16 1415  12/24/16 0653 12/25/16 0353 12/25/16 1026  NA 136   < > 132*   < > 133* 138 136  K 3.7   < > 4.7   < > 4.0 5.0 4.3  CL 105   < > 101   < > 104 108 108  CO2 22   < > 24   < > 24 23 22   GLUCOSE 155*   < > 191*   < > 333* 140* 183*  BUN 9   < > 18   < > 30* 34* 36*  CREATININE 0.51   < > 0.62   < > 0.83 0.87 0.92  CALCIUM 7.9*   < > 7.9*   < > 7.4* 8.0* 7.9*  PROT 5.9*  --  5.5*  --  4.5*  --   --   ALBUMIN 2.2*  --  2.2*  --  1.6* 1.8*  --   AST 14*  --  16  --  15  --   --   ALT 12*  --  14  --  15  --   --   ALKPHOS 87  --  95  --  90  --   --   BILITOT 0.6  --  0.5  --  0.5  --   --   GFRNONAA >60   < > >60   < > >60 >60 >60  GFRAA >60   < > >60   < > >60 >60 >60  ANIONGAP 9   < > 7   < > 5 7 6    < > = values in this interval not displayed.     Hematology Recent Labs  Lab 12/23/16 0358 12/24/16 0653 12/25/16 0353  WBC 14.4* 8.4 13.3*  RBC 4.04 2.80* 3.15*  HGB 10.9* 7.4* 8.6*  HCT 35.0* 23.7* 27.0*  MCV 86.6 84.6 85.7  MCH 27.0 26.4 27.3  MCHC 31.1 31.2 31.9  RDW 15.9* 15.5 15.8*  PLT 481* 251 300    Cardiac Enzymes Recent Labs  Lab 12/21/16 2336 12/22/16 0502  TROPONINI <0.03 <0.03   No results for input(s): TROPIPOC in the last 168 hours.   BNPNo results for input(s): BNP, PROBNP in the last 168 hours.    DDimer No results for input(s): DDIMER in the last 168 hours.   Radiology    Ct Chest W Contrast  Result Date: 12/24/2016 CLINICAL DATA:  Ongoing epigastric abdominal pain for weeks. Pt has known gastric mass as well as hiatal hernia. Hx: HTN EXAM: CT CHEST, ABDOMEN, AND PELVIS WITH CONTRAST TECHNIQUE: Multidetector CT imaging of the chest, abdomen and pelvis was performed following the standard protocol during bolus administration of intravenous contrast. CONTRAST:  141m ISOVUE-300 IOPAMIDOL (ISOVUE-300) INJECTION 61% COMPARISON:  12/17/2016 FINDINGS: CT CHEST FINDINGS Cardiovascular: Heart is mildly enlarged. Minor left coronary artery calcifications. Thoracic aorta is normal in caliber. Mild atherosclerotic calcifications noted along the arch and descending portion. Mediastinum/Nodes: Enlarged left thyroid lobe with evidence of poorly defined nodules. No neck base or axillary masses or enlarged lymph nodes. No mediastinal or hilar masses or pathologically enlarged lymph nodes. Endotracheal tube tip projects 2 cm above the chronic. Nasogastric tube passes into a large hiatal hernia. Trachea is widely patent. Lungs/Pleura: Complete atelectasis of the left lower lobe. Mild dependent atelectasis is noted in the left upper lobe and posterior base of the right lower lobe. Small left pleural effusion. Minimal right pleural effusion. No convincing pneumonia. No pulmonary edema. No pneumothorax. No lung mass or nodule. CT ABDOMEN PELVIS FINDINGS Hepatobiliary: Heterogeneous hypoattenuating area within segment 2 of the liver, measuring approximately 4.7 x 4.5 x 3.4 cm. No other liver mass or lesion. Liver shows central volume loss with mild surface nodularity, morphologic changes raising the possibility of cirrhosis. Gallbladder is unremarkable. No bile duct dilation. Pancreas: Cyst enlarges the pancreatic body and proximal tail. It measures 6.4 x 4.1 x 5.3 cm. No other pancreatic mass or lesion. Spleen:  Borderline enlarged measuring 13.6 x 5.6 x 10.8 cm. No splenic mass or focal lesion. Adrenals/Urinary Tract: No  adrenal masses. Heterogeneously enhancing mass arises from the midpole the right kidney containing a small central focus of calcification. The mass measures 5.8 x 4.8 x 5.7 cm. It appears contained within Gerota's fascia. No other renal masses. Widely patent renal veins. No hydronephrosis. Ureters are normal in course and in caliber. Bladder is decompressed with a Foley catheter. Stomach/Bowel: Moderate to large hiatal hernia. Dense contrast is present dependently within the hernia. There is a homogeneous mass within the stomach that extends from the hiatal hernia to the mid stomach, which appears mural in location. Mass measures 11.4 x 7.1 x 8.2 cm and has average Hounsfield units of 47. It is stable from the prior study. There is a heterogeneous area along the gastric antrum without appears inflammatory. Small irregular fluid collections and patchy inflammatory changes are noted adjacent to the gastric antrum, inferior margin of the lateral segment of the left liver lobe and adjacent to the superior pancreas. Small bowel is unremarkable. Colon is mostly decompressed. There scattered colonic diverticula. No diverticulitis or other colonic inflammatory process. Appendix not definitively seen. Vascular/Lymphatic: Vascular collateral in the left upper quadrant, specifically a splenule renal collateral. Aortic atherosclerosis extending to its branch vessels. No aneurysm. No pathologically enlarged lymph nodes. Reproductive: Uterus and bilateral adnexa are unremarkable. Other: Small amount ascites.  No abdominal wall hernia. MUSCULOSKELETAL FINDINGS No acute fracture or acute finding. Multiple old left-sided rib fractures. No osteoblastic or osteolytic lesions. IMPRESSION: CHEST CT 1. Complete atelectasis of the left lower lobe associated with a small pleural effusion. 2. Mild dependent atelectasis in the  right lower lobe and left upper lobe. 3. No convincing pneumonia.  No pulmonary edema. 4. Mild cardiomegaly. ABDOMEN AND PELVIS CT 1. Inflammatory type changes are noted in the upper abdomen surrounding the gastric antrum and abutting the superior aspect of the pancreas and left liver lobe. There is a irregular area within the gastric antrum appears inflammatory. The inflammatory changes include small fluid collections. There is also a cyst within the pancreatic body and proximal tail. These inflammatory changes appear improved compared to the prior CT scan. Pancreatic cyst has mildly decreased in size. Findings may reflect improved pancreatitis. The inflammatory changes may be due to stomach pathology. 2. Ill-defined hypoattenuating area in segment 2 of the liver. This is new from the prior CT. It is likely inflammatory in etiology. 3. 5.8 cm right renal mass consistent with a renal cell carcinoma. This appears contained within Gerota's fascia and there is no associated adenopathy or venous thrombosis. No evidence of metastatic disease. 4. Morphologic changes of the liver suggests cirrhosis, supported by a borderline splenomegaly and a spleen no renal venous collateral supporting portal venous hypertension. 5. Small amount of ascites, increased from the prior CT. 6. Moderate to large hiatal hernia, stable from the prior study. 7. Homogeneous gastric mass described previously is unchanged. It appears mural in location and may reflect a large leiomyoma. 8. Aortic atherosclerosis. Electronically Signed   By: Lajean Manes M.D.   On: 12/24/2016 09:24   Ct Abdomen Pelvis W Contrast  Result Date: 12/24/2016 CLINICAL DATA:  Ongoing epigastric abdominal pain for weeks. Pt has known gastric mass as well as hiatal hernia. Hx: HTN EXAM: CT CHEST, ABDOMEN, AND PELVIS WITH CONTRAST TECHNIQUE: Multidetector CT imaging of the chest, abdomen and pelvis was performed following the standard protocol during bolus administration  of intravenous contrast. CONTRAST:  163m ISOVUE-300 IOPAMIDOL (ISOVUE-300) INJECTION 61% COMPARISON:  12/17/2016 FINDINGS: CT CHEST FINDINGS Cardiovascular: Heart is mildly enlarged. Minor  left coronary artery calcifications. Thoracic aorta is normal in caliber. Mild atherosclerotic calcifications noted along the arch and descending portion. Mediastinum/Nodes: Enlarged left thyroid lobe with evidence of poorly defined nodules. No neck base or axillary masses or enlarged lymph nodes. No mediastinal or hilar masses or pathologically enlarged lymph nodes. Endotracheal tube tip projects 2 cm above the chronic. Nasogastric tube passes into a large hiatal hernia. Trachea is widely patent. Lungs/Pleura: Complete atelectasis of the left lower lobe. Mild dependent atelectasis is noted in the left upper lobe and posterior base of the right lower lobe. Small left pleural effusion. Minimal right pleural effusion. No convincing pneumonia. No pulmonary edema. No pneumothorax. No lung mass or nodule. CT ABDOMEN PELVIS FINDINGS Hepatobiliary: Heterogeneous hypoattenuating area within segment 2 of the liver, measuring approximately 4.7 x 4.5 x 3.4 cm. No other liver mass or lesion. Liver shows central volume loss with mild surface nodularity, morphologic changes raising the possibility of cirrhosis. Gallbladder is unremarkable. No bile duct dilation. Pancreas: Cyst enlarges the pancreatic body and proximal tail. It measures 6.4 x 4.1 x 5.3 cm. No other pancreatic mass or lesion. Spleen: Borderline enlarged measuring 13.6 x 5.6 x 10.8 cm. No splenic mass or focal lesion. Adrenals/Urinary Tract: No adrenal masses. Heterogeneously enhancing mass arises from the midpole the right kidney containing a small central focus of calcification. The mass measures 5.8 x 4.8 x 5.7 cm. It appears contained within Gerota's fascia. No other renal masses. Widely patent renal veins. No hydronephrosis. Ureters are normal in course and in caliber.  Bladder is decompressed with a Foley catheter. Stomach/Bowel: Moderate to large hiatal hernia. Dense contrast is present dependently within the hernia. There is a homogeneous mass within the stomach that extends from the hiatal hernia to the mid stomach, which appears mural in location. Mass measures 11.4 x 7.1 x 8.2 cm and has average Hounsfield units of 47. It is stable from the prior study. There is a heterogeneous area along the gastric antrum without appears inflammatory. Small irregular fluid collections and patchy inflammatory changes are noted adjacent to the gastric antrum, inferior margin of the lateral segment of the left liver lobe and adjacent to the superior pancreas. Small bowel is unremarkable. Colon is mostly decompressed. There scattered colonic diverticula. No diverticulitis or other colonic inflammatory process. Appendix not definitively seen. Vascular/Lymphatic: Vascular collateral in the left upper quadrant, specifically a splenule renal collateral. Aortic atherosclerosis extending to its branch vessels. No aneurysm. No pathologically enlarged lymph nodes. Reproductive: Uterus and bilateral adnexa are unremarkable. Other: Small amount ascites.  No abdominal wall hernia. MUSCULOSKELETAL FINDINGS No acute fracture or acute finding. Multiple old left-sided rib fractures. No osteoblastic or osteolytic lesions. IMPRESSION: CHEST CT 1. Complete atelectasis of the left lower lobe associated with a small pleural effusion. 2. Mild dependent atelectasis in the right lower lobe and left upper lobe. 3. No convincing pneumonia.  No pulmonary edema. 4. Mild cardiomegaly. ABDOMEN AND PELVIS CT 1. Inflammatory type changes are noted in the upper abdomen surrounding the gastric antrum and abutting the superior aspect of the pancreas and left liver lobe. There is a irregular area within the gastric antrum appears inflammatory. The inflammatory changes include small fluid collections. There is also a cyst within  the pancreatic body and proximal tail. These inflammatory changes appear improved compared to the prior CT scan. Pancreatic cyst has mildly decreased in size. Findings may reflect improved pancreatitis. The inflammatory changes may be due to stomach pathology. 2. Ill-defined hypoattenuating area in segment 2  of the liver. This is new from the prior CT. It is likely inflammatory in etiology. 3. 5.8 cm right renal mass consistent with a renal cell carcinoma. This appears contained within Gerota's fascia and there is no associated adenopathy or venous thrombosis. No evidence of metastatic disease. 4. Morphologic changes of the liver suggests cirrhosis, supported by a borderline splenomegaly and a spleen no renal venous collateral supporting portal venous hypertension. 5. Small amount of ascites, increased from the prior CT. 6. Moderate to large hiatal hernia, stable from the prior study. 7. Homogeneous gastric mass described previously is unchanged. It appears mural in location and may reflect a large leiomyoma. 8. Aortic atherosclerosis. Electronically Signed   By: Lajean Manes M.D.   On: 12/24/2016 09:24   Dg Chest Port 1 View  Result Date: 12/25/2016 CLINICAL DATA:  Respiratory failure EXAM: PORTABLE CHEST 1 VIEW COMPARISON:  12/24/2016, 12/17/2016 FINDINGS: Endotracheal tube tip is about 4.9 cm superior to the carina. Right lung is clear. Elevated left diaphragm with continued consolidation at the left base. Cardiomegaly with atherosclerosis. No pneumothorax. Large hiatal hernia IMPRESSION: 1. Endotracheal tube tip about 4.9 cm superior to carina 2. Stable consolidation and pleural effusion at the left lung base. Hiatal hernia. 3. Cardiomegaly Electronically Signed   By: Donavan Foil M.D.   On: 12/25/2016 03:34   Dg Chest Port 1 View  Result Date: 12/24/2016 CLINICAL DATA:  Respiratory failure EXAM: PORTABLE CHEST 1 VIEW COMPARISON:  12/23/2016 FINDINGS: Endotracheal tube has been advanced and is now at  the carina. Recommend withdrawal of 3 cm. NG tube remains in the stomach. Elevated left hemidiaphragm unchanged with left lower lobe consolidation and small left effusion. Mild right lower lobe atelectasis unchanged Right arm PICC tip in the lower SVC unchanged IMPRESSION: Endotracheal tube at the carina, recommend withdrawal 3 cm Left lower lobe consolidation unchanged. These results will be called to the ordering clinician or representative by the Radiologist Assistant, and communication documented in the PACS or zVision Dashboard. Electronically Signed   By: Franchot Gallo M.D.   On: 12/24/2016 08:17   Dg Abd Portable 1v  Result Date: 12/24/2016 CLINICAL DATA:  Reason for exam: encounter for nasogastric tube. Patient refused to relax right shoulder down, resulting in a more oblique image. EXAM: PORTABLE ABDOMEN - 1 VIEW COMPARISON:  12/23/2016 FINDINGS: Nasogastric tube tip projects just below the diaphragm, with the tip and side-hole within stomach and hiatal hernia respectively. There is no bowel dilation to suggest obstruction or significant generalized adynamic ileus. IMPRESSION: Nasogastric tube lies within the stomach, primarily within the moderate to large hiatal hernia, tip just below the left hemidiaphragm. Electronically Signed   By: Lajean Manes M.D.   On: 12/24/2016 08:20   Dg Abd Portable 1v  Result Date: 12/23/2016 CLINICAL DATA:  Initial evaluation for feeding tube placement. EXAM: PORTABLE ABDOMEN - 1 VIEW COMPARISON:  Prior CT from 12/17/2016.  The FINDINGS: Enteric tube in place with tip in side hole overlying the left upper quadrant, side hole just above the left hemidiaphragm. This likely lies within the stomach given previously seen large hiatal hernia on prior CT. Tip projects inferiorly. Bowel gas pattern is nonobstructive. Residual enteric contrast seen overlying the right lower quadrant. Advanced degenerative changes about the hips. Degenerative changes noted within lower lumbar  spine. IMPRESSION: Tip and side hole of enteric tube overlying the left upper quadrant, likely within the stomach given previously seen large hiatal hernia. Tip projects inferiorly. Electronically Signed   By: Marland Kitchen  Jeannine Boga M.D.   On: 12/23/2016 23:58    Cardiac Studies   Echo:  12/23/16  Study Conclusions  - Left ventricle: The cavity size was normal. Wall thickness was   normal. Systolic function was normal. The estimated ejection   fraction was in the range of 55% to 60%. Wall motion was normal;   there were no regional wall motion abnormalities.   Patient Profile     66 y.o. female with PAF, gastric mass with abnormal EKG showing diffuse ST segment elevation.   Assessment & Plan    PAF:  Unable to get OG so no oral meds.  Continue IV amiodarone.   ABNORMAL EKG:  No acute changes on EKG yesterday. No further work up.    ACUTE RESPIRATORY FAILURE:  Per CCM.   Signed, Minus Breeding, MD  12/25/2016, 4:22 PM

## 2016-12-26 LAB — GLUCOSE, CAPILLARY
GLUCOSE-CAPILLARY: 168 mg/dL — AB (ref 65–99)
GLUCOSE-CAPILLARY: 179 mg/dL — AB (ref 65–99)
Glucose-Capillary: 144 mg/dL — ABNORMAL HIGH (ref 65–99)
Glucose-Capillary: 160 mg/dL — ABNORMAL HIGH (ref 65–99)
Glucose-Capillary: 163 mg/dL — ABNORMAL HIGH (ref 65–99)
Glucose-Capillary: 166 mg/dL — ABNORMAL HIGH (ref 65–99)

## 2016-12-26 LAB — RENAL FUNCTION PANEL
Albumin: 1.8 g/dL — ABNORMAL LOW (ref 3.5–5.0)
Anion gap: 8 (ref 5–15)
BUN: 43 mg/dL — ABNORMAL HIGH (ref 6–20)
CALCIUM: 8.3 mg/dL — AB (ref 8.9–10.3)
CHLORIDE: 109 mmol/L (ref 101–111)
CO2: 21 mmol/L — AB (ref 22–32)
Creatinine, Ser: 0.99 mg/dL (ref 0.44–1.00)
GFR, EST NON AFRICAN AMERICAN: 58 mL/min — AB (ref >60)
Glucose, Bld: 175 mg/dL — ABNORMAL HIGH (ref 65–99)
PHOSPHORUS: 4.2 mg/dL (ref 2.5–4.6)
Potassium: 5 mmol/L (ref 3.5–5.1)
Sodium: 138 mmol/L (ref 135–145)

## 2016-12-26 LAB — COMPREHENSIVE METABOLIC PANEL
ALBUMIN: 1.8 g/dL — AB (ref 3.5–5.0)
ALK PHOS: 163 U/L — AB (ref 38–126)
ALT: 32 U/L (ref 14–54)
ANION GAP: 7 (ref 5–15)
AST: 35 U/L (ref 15–41)
BUN: 42 mg/dL — ABNORMAL HIGH (ref 6–20)
CALCIUM: 8.3 mg/dL — AB (ref 8.9–10.3)
CO2: 23 mmol/L (ref 22–32)
Chloride: 109 mmol/L (ref 101–111)
Creatinine, Ser: 0.99 mg/dL (ref 0.44–1.00)
GFR calc Af Amer: >60 mL/min (ref >60)
GFR calc non Af Amer: 58 mL/min — ABNORMAL LOW (ref >60)
GLUCOSE: 176 mg/dL — AB (ref 65–99)
Potassium: 5 mmol/L (ref 3.5–5.1)
SODIUM: 139 mmol/L (ref 135–145)
Total Bilirubin: 0.6 mg/dL (ref 0.3–1.2)
Total Protein: 5.5 g/dL — ABNORMAL LOW (ref 6.5–8.1)

## 2016-12-26 LAB — MAGNESIUM: Magnesium: 2.6 mg/dL — ABNORMAL HIGH (ref 1.7–2.4)

## 2016-12-26 LAB — CBC WITH DIFFERENTIAL/PLATELET
BASOS ABS: 0 10*3/uL (ref 0.0–0.1)
BASOS PCT: 0 %
EOS ABS: 0 10*3/uL (ref 0.0–0.7)
EOS PCT: 0 %
HCT: 27.4 % — ABNORMAL LOW (ref 36.0–46.0)
Hemoglobin: 8.6 g/dL — ABNORMAL LOW (ref 12.0–15.0)
Lymphocytes Relative: 6 %
Lymphs Abs: 0.6 10*3/uL — ABNORMAL LOW (ref 0.7–4.0)
MCH: 27 pg (ref 26.0–34.0)
MCHC: 31.4 g/dL (ref 30.0–36.0)
MCV: 86.2 fL (ref 78.0–100.0)
MONO ABS: 0.4 10*3/uL (ref 0.1–1.0)
Monocytes Relative: 3 %
Neutro Abs: 10.1 10*3/uL — ABNORMAL HIGH (ref 1.7–7.7)
Neutrophils Relative %: 91 %
PLATELETS: 304 10*3/uL (ref 150–400)
RBC: 3.18 MIL/uL — ABNORMAL LOW (ref 3.87–5.11)
RDW: 16.3 % — ABNORMAL HIGH (ref 11.5–15.5)
WBC: 11 10*3/uL — ABNORMAL HIGH (ref 4.0–10.5)

## 2016-12-26 LAB — VANCOMYCIN, TROUGH: Vancomycin Tr: 34 ug/mL (ref 15–20)

## 2016-12-26 LAB — TRIGLYCERIDES: Triglycerides: 162 mg/dL — ABNORMAL HIGH (ref <150)

## 2016-12-26 LAB — PROCALCITONIN

## 2016-12-26 LAB — PREALBUMIN: PREALBUMIN: 16.6 mg/dL — AB (ref 18–38)

## 2016-12-26 MED ORDER — VANCOMYCIN HCL IN DEXTROSE 750-5 MG/150ML-% IV SOLN
750.0000 mg | Freq: Two times a day (BID) | INTRAVENOUS | Status: DC
  Filled 2016-12-26: qty 150

## 2016-12-26 MED ORDER — ACETAMINOPHEN 650 MG RE SUPP: 650.0000 mg | RECTAL | Status: DC | PRN

## 2016-12-26 MED ORDER — HYDRALAZINE HCL 20 MG/ML IJ SOLN
10.0000 mg | INTRAMUSCULAR | Status: DC | PRN
  Administered 2016-12-27: 10 mg via INTRAVENOUS
  Filled 2016-12-26: qty 1

## 2016-12-26 MED ORDER — WHITE PETROLATUM EX OINT
TOPICAL_OINTMENT | CUTANEOUS | Status: AC
  Administered 2016-12-26: 13:00:00
  Filled 2016-12-26: qty 28.35

## 2016-12-26 MED ORDER — TRACE MINERALS CR-CU-MN-SE-ZN 10-1000-500-60 MCG/ML IV SOLN
INTRAVENOUS | Status: AC
  Administered 2016-12-26: 17:00:00 via INTRAVENOUS
  Filled 2016-12-26: qty 796.8

## 2016-12-26 MED ORDER — CHLORHEXIDINE GLUCONATE CLOTH 2 % EX PADS
6.0000 | MEDICATED_PAD | Freq: Every day | CUTANEOUS | Status: DC
Start: 2016-12-26 — End: 2016-12-27
  Administered 2016-12-26: 6 via TOPICAL

## 2016-12-26 NOTE — Progress Notes (Signed)
8 Days Post-Op   Subjective/Chief Complaint: Remains intubated, still requiring significant vent support  Requiring significant sedation for agitation   Objective: Vital signs in last 24 hours: Temp:  [97.5 F (36.4 C)-98.7 F (37.1 C)] 98 F (36.7 C) (11/12 0802) Pulse Rate:  [52-71] 52 (11/12 0917) Resp:  [11-18] 11 (11/12 0700) BP: (108-164)/(61-111) 162/95 (11/12 0917) SpO2:  [97 %-100 %] 100 % (11/12 0700) FiO2 (%):  [40 %] 40 % (11/12 0918) Weight:  [99.6 kg (219 lb 9.3 oz)] 99.6 kg (219 lb 9.3 oz) (11/12 0400) Last BM Date: 12/22/16  Intake/Output from previous day: 11/11 0701 - 11/12 0700 In: 3264.4 [I.V.:2406.4; IV Piggyback:858] Out: 1955 [Urine:1955] Intake/Output this shift: No intake/output data recorded.  WDWN  Intubated, sedated during bath CV - RRR Lungs - decreased left basilar breath sounds; Abd - soft, non-tender  Lab Results:  Recent Labs    12/25/16 0353 12/26/16 0258  WBC 13.3* 11.0*  HGB 8.6* 8.6*  HCT 27.0* 27.4*  PLT 300 304   BMET Recent Labs    12/25/16 1026 12/26/16 0258  NA 136 139  138  K 4.3 5.0  5.0  CL 108 109  109  CO2 22 23  21*  GLUCOSE 183* 176*  175*  BUN 36* 42*  43*  CREATININE 0.92 0.99  0.99  CALCIUM 7.9* 8.3*  8.3*   PT/INR No results for input(s): LABPROT, INR in the last 72 hours. ABG Recent Labs    12/24/16 0335 12/25/16 0535  PHART 7.391 7.374  HCO3 23.9 23.3    Studies/Results: Dg Chest Port 1 View  Result Date: 12/25/2016 CLINICAL DATA:  Respiratory failure EXAM: PORTABLE CHEST 1 VIEW COMPARISON:  12/24/2016, 12/17/2016 FINDINGS: Endotracheal tube tip is about 4.9 cm superior to the carina. Right lung is clear. Elevated left diaphragm with continued consolidation at the left base. Cardiomegaly with atherosclerosis. No pneumothorax. Large hiatal hernia IMPRESSION: 1. Endotracheal tube tip about 4.9 cm superior to carina 2. Stable consolidation and pleural effusion at the left lung base.  Hiatal hernia. 3. Cardiomegaly Electronically Signed   By: Donavan Foil M.D.   On: 12/25/2016 03:34    Anti-infectives: Anti-infectives (From admission, onward)   Start     Dose/Rate Route Frequency Ordered Stop   12/23/16 2200  vancomycin (VANCOCIN) 1,250 mg in sodium chloride 0.9 % 250 mL IVPB     1,250 mg 166.7 mL/hr over 90 Minutes Intravenous Every 12 hours 12/23/16 0840     12/23/16 1500  piperacillin-tazobactam (ZOSYN) IVPB 3.375 g     3.375 g 12.5 mL/hr over 240 Minutes Intravenous Every 8 hours 12/23/16 0840     12/23/16 0900  vancomycin (VANCOCIN) 1,750 mg in sodium chloride 0.9 % 500 mL IVPB     1,750 mg 250 mL/hr over 120 Minutes Intravenous  Once 12/23/16 0836 12/23/16 1252   12/23/16 0900  piperacillin-tazobactam (ZOSYN) IVPB 3.375 g     3.375 g 100 mL/hr over 30 Minutes Intravenous  Once 12/23/16 0836 12/23/16 1113   12/23/16 0600  cefoTEtan in Dextrose 5% (CEFOTAN) IVPB 2 g     2 g Intravenous To Surgery 12/22/16 1401 12/24/16 0600   12/17/16 0515  piperacillin-tazobactam (ZOSYN) IVPB 3.375 g     3.375 g 100 mL/hr over 30 Minutes Intravenous  Once 12/17/16 0503 12/17/16 0636      Assessment/Plan: Large hiatal hernia with most of stomach in chest Large gastric mass - mucosal biopsies show only chronic gastritis; CT appearance may be  consistent with leiomyoma measuring 11.4 x 7.1 x 8.2 cm VDRF probably due to chronic aspiration - still requiring significant vent support Chronic alcoholism - CT shows changes consistent with cirrhosis, some ascites, but T. Bili normal    Patient is not ready for surgery yet, and her surgery will be quite complex.  [facility]  Will discuss with CCM.  LOS: 9 days    Nereyda Bowler K. 12/26/2016

## 2016-12-26 NOTE — Progress Notes (Signed)
Pharmacy Antibiotic Note  Nicole [name] is a 66 y.o. female admitted on 12/16/2016 with sepsis.  Pharmacy has been consulted for vancomycin and zosyn dosing.  Patient looks worse overall today. LA was elevated yesterday. Has had increased work of breathing today. WBC increasing to 14.4. Suppose to go to OR today for trans hiatal gastrectomy and possible thoracotomy. SCr stable, CrCl ~42ml/min  Vancomycin trough returned at 34. The nurse had already hung part of the bag, it is estimated that the patient received approximately 20% of the bag before this was discovered and the infusion was stopped. Based on calculations, will reduce the dose of vancomycin to 750/12h and will resume tomorrow.  Plan: Zosyn 3.375 gm IV q8h (4 hour infusion) Reduce vancomycin to 750 IV q12h Monitor clinical picture, renal function, VT prn F/U C&S, abx deescalation / LOT   Height: 5\' 8"  (172.7 cm) Weight: 219 lb 9.3 oz (99.6 kg) IBW/kg (Calculated) : 63.9  Temp (24hrs), Avg:98.2 F (36.8 C), Min:97.5 F (36.4 C), Max:99 F (37.2 C)  Recent Labs  Lab 12/22/16 0502 12/22/16 1415 12/23/16 0358 12/24/16 0653 12/24/16 1514 12/25/16 0353 12/25/16 1026 12/26/16 0258 12/26/16 2153  WBC 10.2  --  14.4* 8.4  --  13.3*  --  11.0*  --   CREATININE 0.57 0.62 0.65 0.83  --  0.87 0.92 0.99  0.99  --   LATICACIDVEN  --  2.0*  --   --  1.3  --   --   --   --   VANCOTROUGH  --   --   --   --   --   --   --   --  34*      Allergies  Allergen Reactions  . Other Shortness Of Breath and Anxiety    Reaction to fast food and processed food  . Shellfish Allergy Shortness Of Breath  . Iodinated Diagnostic Agents Swelling    Hand swelling from contrast dye for MRI    Antimicrobials this admission: Zosyn 11/9 >>  Vancomycin 11/9 >>   Dose adjustments this admission: 11/12 VT: 34  Microbiology results: 11/9 BCx: sent 11/3 MRSA PCR: negative     Hughes Better, PharmD, BCPS Clinical Pharmacist 12/26/2016  10:45 PM

## 2016-12-26 NOTE — Progress Notes (Signed)
Progress Note  Patient Name: Nicole [name] Date of Encounter: 12/26/2016  Primary Cardiologist: Hilty  Subjective   Intubated sedated   Inpatient Medications    Scheduled Meds: . chlorhexidine gluconate (MEDLINE KIT)  15 mL Mouth Rinse BID  . insulin aspart  0-20 Units Subcutaneous Q4H  . insulin glargine  10 Units Subcutaneous Q24H  . levalbuterol  0.63 mg Inhalation Q6H  . mouth rinse  15 mL Mouth Rinse QID  . methylPREDNISolone (SOLU-MEDROL) injection  40 mg Intravenous Q12H  . ondansetron  4 mg Oral Q8H  . pantoprazole  40 mg Intravenous Q12H  . sodium chloride flush  10-40 mL Intracatheter Q12H   Continuous Infusions: . sodium chloride 10 mL/hr at 12/25/16 0300  . amiodarone 30 mg/hr (12/26/16 0009)  . dexmedetomidine (PRECEDEX) IV infusion 0.6 mcg/kg/hr (12/26/16 3557)  . diltiazem (CARDIZEM) infusion    . fentaNYL infusion INTRAVENOUS 150 mcg/hr (12/26/16 3220)  . ondansetron (ZOFRAN) IV Stopped (12/26/16 0548)  . piperacillin-tazobactam (ZOSYN)  IV 3.375 g (12/26/16 0627)  . TPN ADULT (ION) 60 mL/hr at 12/25/16 2000  . vancomycin Stopped (12/26/16 0006)   PRN Meds: acetaminophen (TYLENOL) oral liquid 160 mg/5 mL, fentaNYL, midazolam, sodium chloride flush   Vital Signs    Vitals:   12/26/16 0400 12/26/16 0500 12/26/16 0600 12/26/16 0700  BP: (!) 155/88 (!) 144/85 (!) 160/92 (!) 164/95  Pulse: 60 60 (!) 58 (!) 59  Resp: 16 15 17 11   Temp:      TempSrc:      SpO2: 97% 97% 99% 100%  Weight: 219 lb 9.3 oz (99.6 kg)     Height:        Intake/Output Summary (Last 24 hours) at 12/26/2016 0753 Last data filed at 12/26/2016 0700 Gross per 24 hour  Intake 3264.37 ml  Output 1955 ml  Net 1309.37 ml   Filed Weights   12/24/16 0400 12/25/16 0445 12/26/16 0400  Weight: 208 lb 8.9 oz (94.6 kg) 216 lb 4.3 oz (98.1 kg) 219 lb 9.3 oz (99.6 kg)    Telemetry    NSR 12/26/2016  - Personally Reviewed  ECG    NSR normal ECG  - Personally Reviewed  Physical  Exam  Intubated pale  GEN: No acute distress.   Neck: No JVD Cardiac: RRR, no murmurs, rubs, or gallops.  Respiratory: Clear to auscultation bilaterally. GI: Soft, nontender, non-distended  MS: No edema; No deformity. Neuro:  Nonfocal  Psych: Normal affect   Labs    Chemistry Recent Labs  Lab 12/22/16 1415  12/24/16 0653 12/25/16 0353 12/25/16 1026 12/26/16 0258  NA 132*   < > 133* 138 136 139  138  K 4.7   < > 4.0 5.0 4.3 5.0  5.0  CL 101   < > 104 108 108 109  109  CO2 24   < > 24 23 22 23   21*  GLUCOSE 191*   < > 333* 140* 183* 176*  175*  BUN 18   < > 30* 34* 36* 42*  43*  CREATININE 0.62   < > 0.83 0.87 0.92 0.99  0.99  CALCIUM 7.9*   < > 7.4* 8.0* 7.9* 8.3*  8.3*  PROT 5.5*  --  4.5*  --   --  5.5*  ALBUMIN 2.2*  --  1.6* 1.8*  --  1.8*  1.8*  AST 16  --  15  --   --  35  ALT 14  --  15  --   --  32  ALKPHOS 95  --  90  --   --  163*  BILITOT 0.5  --  0.5  --   --  0.6  GFRNONAA >60   < > >60 >60 >60 58*  58*  GFRAA >60   < > >60 >60 >60 >60  >60  ANIONGAP 7   < > 5 7 6 7  8    < > = values in this interval not displayed.     Hematology Recent Labs  Lab 12/24/16 0653 12/25/16 0353 12/26/16 0258  WBC 8.4 13.3* 11.0*  RBC 2.80* 3.15* 3.18*  HGB 7.4* 8.6* 8.6*  HCT 23.7* 27.0* 27.4*  MCV 84.6 85.7 86.2  MCH 26.4 27.3 27.0  MCHC 31.2 31.9 31.4  RDW 15.5 15.8* 16.3*  PLT 251 300 304    Cardiac Enzymes Recent Labs  Lab 12/21/16 2336 12/22/16 0502  TROPONINI <0.03 <0.03   No results for input(s): TROPIPOC in the last 168 hours.   BNPNo results for input(s): BNP, PROBNP in the last 168 hours.   DDimer No results for input(s): DDIMER in the last 168 hours.   Radiology    Ct Chest W Contrast  Result Date: 12/24/2016 CLINICAL DATA:  Ongoing epigastric abdominal pain for weeks. Pt has known gastric mass as well as hiatal hernia. Hx: HTN EXAM: CT CHEST, ABDOMEN, AND PELVIS WITH CONTRAST TECHNIQUE: Multidetector CT imaging of the chest,  abdomen and pelvis was performed following the standard protocol during bolus administration of intravenous contrast. CONTRAST:  146m ISOVUE-300 IOPAMIDOL (ISOVUE-300) INJECTION 61% COMPARISON:  12/17/2016 FINDINGS: CT CHEST FINDINGS Cardiovascular: Heart is mildly enlarged. Minor left coronary artery calcifications. Thoracic aorta is normal in caliber. Mild atherosclerotic calcifications noted along the arch and descending portion. Mediastinum/Nodes: Enlarged left thyroid lobe with evidence of poorly defined nodules. No neck base or axillary masses or enlarged lymph nodes. No mediastinal or hilar masses or pathologically enlarged lymph nodes. Endotracheal tube tip projects 2 cm above the chronic. Nasogastric tube passes into a large hiatal hernia. Trachea is widely patent. Lungs/Pleura: Complete atelectasis of the left lower lobe. Mild dependent atelectasis is noted in the left upper lobe and posterior base of the right lower lobe. Small left pleural effusion. Minimal right pleural effusion. No convincing pneumonia. No pulmonary edema. No pneumothorax. No lung mass or nodule. CT ABDOMEN PELVIS FINDINGS Hepatobiliary: Heterogeneous hypoattenuating area within segment 2 of the liver, measuring approximately 4.7 x 4.5 x 3.4 cm. No other liver mass or lesion. Liver shows central volume loss with mild surface nodularity, morphologic changes raising the possibility of cirrhosis. Gallbladder is unremarkable. No bile duct dilation. Pancreas: Cyst enlarges the pancreatic body and proximal tail. It measures 6.4 x 4.1 x 5.3 cm. No other pancreatic mass or lesion. Spleen: Borderline enlarged measuring 13.6 x 5.6 x 10.8 cm. No splenic mass or focal lesion. Adrenals/Urinary Tract: No adrenal masses. Heterogeneously enhancing mass arises from the midpole the right kidney containing a small central focus of calcification. The mass measures 5.8 x 4.8 x 5.7 cm. It appears contained within Gerota's fascia. No other renal masses.  Widely patent renal veins. No hydronephrosis. Ureters are normal in course and in caliber. Bladder is decompressed with a Foley catheter. Stomach/Bowel: Moderate to large hiatal hernia. Dense contrast is present dependently within the hernia. There is a homogeneous mass within the stomach that extends from the hiatal hernia to the mid stomach, which appears mural in location. Mass measures 11.4 x 7.1 x 8.2 cm and has  average Hounsfield units of 47. It is stable from the prior study. There is a heterogeneous area along the gastric antrum without appears inflammatory. Small irregular fluid collections and patchy inflammatory changes are noted adjacent to the gastric antrum, inferior margin of the lateral segment of the left liver lobe and adjacent to the superior pancreas. Small bowel is unremarkable. Colon is mostly decompressed. There scattered colonic diverticula. No diverticulitis or other colonic inflammatory process. Appendix not definitively seen. Vascular/Lymphatic: Vascular collateral in the left upper quadrant, specifically a splenule renal collateral. Aortic atherosclerosis extending to its branch vessels. No aneurysm. No pathologically enlarged lymph nodes. Reproductive: Uterus and bilateral adnexa are unremarkable. Other: Small amount ascites.  No abdominal wall hernia. MUSCULOSKELETAL FINDINGS No acute fracture or acute finding. Multiple old left-sided rib fractures. No osteoblastic or osteolytic lesions. IMPRESSION: CHEST CT 1. Complete atelectasis of the left lower lobe associated with a small pleural effusion. 2. Mild dependent atelectasis in the right lower lobe and left upper lobe. 3. No convincing pneumonia.  No pulmonary edema. 4. Mild cardiomegaly. ABDOMEN AND PELVIS CT 1. Inflammatory type changes are noted in the upper abdomen surrounding the gastric antrum and abutting the superior aspect of the pancreas and left liver lobe. There is a irregular area within the gastric antrum appears  inflammatory. The inflammatory changes include small fluid collections. There is also a cyst within the pancreatic body and proximal tail. These inflammatory changes appear improved compared to the prior CT scan. Pancreatic cyst has mildly decreased in size. Findings may reflect improved pancreatitis. The inflammatory changes may be due to stomach pathology. 2. Ill-defined hypoattenuating area in segment 2 of the liver. This is new from the prior CT. It is likely inflammatory in etiology. 3. 5.8 cm right renal mass consistent with a renal cell carcinoma. This appears contained within Gerota's fascia and there is no associated adenopathy or venous thrombosis. No evidence of metastatic disease. 4. Morphologic changes of the liver suggests cirrhosis, supported by a borderline splenomegaly and a spleen no renal venous collateral supporting portal venous hypertension. 5. Small amount of ascites, increased from the prior CT. 6. Moderate to large hiatal hernia, stable from the prior study. 7. Homogeneous gastric mass described previously is unchanged. It appears mural in location and may reflect a large leiomyoma. 8. Aortic atherosclerosis. Electronically Signed   By: Lajean Manes M.D.   On: 12/24/2016 09:24   Ct Abdomen Pelvis W Contrast  Result Date: 12/24/2016 CLINICAL DATA:  Ongoing epigastric abdominal pain for weeks. Pt has known gastric mass as well as hiatal hernia. Hx: HTN EXAM: CT CHEST, ABDOMEN, AND PELVIS WITH CONTRAST TECHNIQUE: Multidetector CT imaging of the chest, abdomen and pelvis was performed following the standard protocol during bolus administration of intravenous contrast. CONTRAST:  178m ISOVUE-300 IOPAMIDOL (ISOVUE-300) INJECTION 61% COMPARISON:  12/17/2016 FINDINGS: CT CHEST FINDINGS Cardiovascular: Heart is mildly enlarged. Minor left coronary artery calcifications. Thoracic aorta is normal in caliber. Mild atherosclerotic calcifications noted along the arch and descending portion.  Mediastinum/Nodes: Enlarged left thyroid lobe with evidence of poorly defined nodules. No neck base or axillary masses or enlarged lymph nodes. No mediastinal or hilar masses or pathologically enlarged lymph nodes. Endotracheal tube tip projects 2 cm above the chronic. Nasogastric tube passes into a large hiatal hernia. Trachea is widely patent. Lungs/Pleura: Complete atelectasis of the left lower lobe. Mild dependent atelectasis is noted in the left upper lobe and posterior base of the right lower lobe. Small left pleural effusion. Minimal right pleural effusion.  No convincing pneumonia. No pulmonary edema. No pneumothorax. No lung mass or nodule. CT ABDOMEN PELVIS FINDINGS Hepatobiliary: Heterogeneous hypoattenuating area within segment 2 of the liver, measuring approximately 4.7 x 4.5 x 3.4 cm. No other liver mass or lesion. Liver shows central volume loss with mild surface nodularity, morphologic changes raising the possibility of cirrhosis. Gallbladder is unremarkable. No bile duct dilation. Pancreas: Cyst enlarges the pancreatic body and proximal tail. It measures 6.4 x 4.1 x 5.3 cm. No other pancreatic mass or lesion. Spleen: Borderline enlarged measuring 13.6 x 5.6 x 10.8 cm. No splenic mass or focal lesion. Adrenals/Urinary Tract: No adrenal masses. Heterogeneously enhancing mass arises from the midpole the right kidney containing a small central focus of calcification. The mass measures 5.8 x 4.8 x 5.7 cm. It appears contained within Gerota's fascia. No other renal masses. Widely patent renal veins. No hydronephrosis. Ureters are normal in course and in caliber. Bladder is decompressed with a Foley catheter. Stomach/Bowel: Moderate to large hiatal hernia. Dense contrast is present dependently within the hernia. There is a homogeneous mass within the stomach that extends from the hiatal hernia to the mid stomach, which appears mural in location. Mass measures 11.4 x 7.1 x 8.2 cm and has average Hounsfield  units of 47. It is stable from the prior study. There is a heterogeneous area along the gastric antrum without appears inflammatory. Small irregular fluid collections and patchy inflammatory changes are noted adjacent to the gastric antrum, inferior margin of the lateral segment of the left liver lobe and adjacent to the superior pancreas. Small bowel is unremarkable. Colon is mostly decompressed. There scattered colonic diverticula. No diverticulitis or other colonic inflammatory process. Appendix not definitively seen. Vascular/Lymphatic: Vascular collateral in the left upper quadrant, specifically a splenule renal collateral. Aortic atherosclerosis extending to its branch vessels. No aneurysm. No pathologically enlarged lymph nodes. Reproductive: Uterus and bilateral adnexa are unremarkable. Other: Small amount ascites.  No abdominal wall hernia. MUSCULOSKELETAL FINDINGS No acute fracture or acute finding. Multiple old left-sided rib fractures. No osteoblastic or osteolytic lesions. IMPRESSION: CHEST CT 1. Complete atelectasis of the left lower lobe associated with a small pleural effusion. 2. Mild dependent atelectasis in the right lower lobe and left upper lobe. 3. No convincing pneumonia.  No pulmonary edema. 4. Mild cardiomegaly. ABDOMEN AND PELVIS CT 1. Inflammatory type changes are noted in the upper abdomen surrounding the gastric antrum and abutting the superior aspect of the pancreas and left liver lobe. There is a irregular area within the gastric antrum appears inflammatory. The inflammatory changes include small fluid collections. There is also a cyst within the pancreatic body and proximal tail. These inflammatory changes appear improved compared to the prior CT scan. Pancreatic cyst has mildly decreased in size. Findings may reflect improved pancreatitis. The inflammatory changes may be due to stomach pathology. 2. Ill-defined hypoattenuating area in segment 2 of the liver. This is new from the  prior CT. It is likely inflammatory in etiology. 3. 5.8 cm right renal mass consistent with a renal cell carcinoma. This appears contained within Gerota's fascia and there is no associated adenopathy or venous thrombosis. No evidence of metastatic disease. 4. Morphologic changes of the liver suggests cirrhosis, supported by a borderline splenomegaly and a spleen no renal venous collateral supporting portal venous hypertension. 5. Small amount of ascites, increased from the prior CT. 6. Moderate to large hiatal hernia, stable from the prior study. 7. Homogeneous gastric mass described previously is unchanged. It appears mural in location  and may reflect a large leiomyoma. 8. Aortic atherosclerosis. Electronically Signed   By: Lajean Manes M.D.   On: 12/24/2016 09:24   Dg Chest Port 1 View  Result Date: 12/25/2016 CLINICAL DATA:  Respiratory failure EXAM: PORTABLE CHEST 1 VIEW COMPARISON:  12/24/2016, 12/17/2016 FINDINGS: Endotracheal tube tip is about 4.9 cm superior to the carina. Right lung is clear. Elevated left diaphragm with continued consolidation at the left base. Cardiomegaly with atherosclerosis. No pneumothorax. Large hiatal hernia IMPRESSION: 1. Endotracheal tube tip about 4.9 cm superior to carina 2. Stable consolidation and pleural effusion at the left lung base. Hiatal hernia. 3. Cardiomegaly Electronically Signed   By: Donavan Foil M.D.   On: 12/25/2016 03:34   Dg Chest Port 1 View  Result Date: 12/24/2016 CLINICAL DATA:  Respiratory failure EXAM: PORTABLE CHEST 1 VIEW COMPARISON:  12/23/2016 FINDINGS: Endotracheal tube has been advanced and is now at the carina. Recommend withdrawal of 3 cm. NG tube remains in the stomach. Elevated left hemidiaphragm unchanged with left lower lobe consolidation and small left effusion. Mild right lower lobe atelectasis unchanged Right arm PICC tip in the lower SVC unchanged IMPRESSION: Endotracheal tube at the carina, recommend withdrawal 3 cm Left lower  lobe consolidation unchanged. These results will be called to the ordering clinician or representative by the Radiologist Assistant, and communication documented in the PACS or zVision Dashboard. Electronically Signed   By: Franchot Gallo M.D.   On: 12/24/2016 08:17   Dg Abd Portable 1v  Result Date: 12/24/2016 CLINICAL DATA:  Reason for exam: encounter for nasogastric tube. Patient refused to relax right shoulder down, resulting in a more oblique image. EXAM: PORTABLE ABDOMEN - 1 VIEW COMPARISON:  12/23/2016 FINDINGS: Nasogastric tube tip projects just below the diaphragm, with the tip and side-hole within stomach and hiatal hernia respectively. There is no bowel dilation to suggest obstruction or significant generalized adynamic ileus. IMPRESSION: Nasogastric tube lies within the stomach, primarily within the moderate to large hiatal hernia, tip just below the left hemidiaphragm. Electronically Signed   By: Lajean Manes M.D.   On: 12/24/2016 08:20    Cardiac Studies   Echo 12/23/16 EF 55-60%   Patient Profile     66 y.o. female with gastric mass and respiratory failure intubated PAF On amiodarone Echo with normal EF    Assessment & Plan    1) Abnormal ECG - resolved artifact no rub echo no RWMAls 2) PAF continue amiodarone while intubated 3) Respiratory failure/Aspiration: per CCM remains intubated history ETOH abuse 4) Gastric Mass:  Per GI on H 2 blocker TPN/NG tube  For questions or updates, please contact [facility] Please consult [email or web address] for contact info under Cardiology/STEMI.      Signed, Jenkins Rouge, MD  12/26/2016, 7:53 AM

## 2016-12-26 NOTE — Progress Notes (Signed)
PULMONARY / CRITICAL CARE MEDICINE   Name: Nicole [name] MRN: [id number] DOB: 1950-06-30    ADMISSION DATE:  12/16/2016 CONSULTATION DATE:  12/23/2016  REFERRING MD:  Dr. Posey Pronto, [facility]  CHIEF COMPLAINT:  Short of breath  HISTORY OF PRESENT ILLNESS:   67 yo female with hx of ETOH presented with dyspnea and abdominal pain.  She was found to have gastric mass and hiatal hernia.  She developed respiratory failure from aspiration pneumonia.  SUBJECTIVE:  Denies chest or abdominal pain.  Remains on full vent support and amiodarone.  VITAL SIGNS: BP (!) 176/98   Pulse 64   Temp 98.5 F (36.9 C) (Oral)   Resp 17   Ht 5\' 8"  (1.727 m)   Wt 219 lb 9.3 oz (99.6 kg)   SpO2 98%   BMI 33.39 kg/m   HEMODYNAMICS: CVP:  [8 mmHg] 8 mmHg  VENTILATOR SETTINGS: Vent Mode: PRVC FiO2 (%):  [40 %] 40 % Set Rate:  [16 bmp] 16 bmp Vt Set:  [510 mL] 510 mL PEEP:  [5 cmH20] 5 cmH20 Plateau Pressure:  [16 cmH20-19 cmH20] 16 cmH20  INTAKE / OUTPUT: I/O last 3 completed shifts: In: 4935.3 [I.V.:3656.8; IV Piggyback:1278.5] Out: 2690 [Urine:2690]  PHYSICAL EXAMINATION:  General - alert Eyes - pupils reactive ENT - ETT in place Cardiac - irregular, no murmur Chest - decreased BS Lt base Abd - soft, non tender Ext - no edema Skin - no rashes Neuro - normal strength  LABS:  BMET Recent Labs  Lab 12/25/16 0353 12/25/16 1026 12/26/16 0258  NA 138 136 139  138  K 5.0 4.3 5.0  5.0  CL 108 108 109  109  CO2 23 22 23   21*  BUN 34* 36* 42*  43*  CREATININE 0.87 0.92 0.99  0.99  GLUCOSE 140* 183* 176*  175*    Electrolytes Recent Labs  Lab 12/24/16 0653 12/25/16 0353 12/25/16 1026 12/26/16 0258  CALCIUM 7.4* 8.0* 7.9* 8.3*  8.3*  MG 2.3 2.6*  --  2.6*  PHOS 3.3 3.7  --  4.2    CBC Recent Labs  Lab 12/24/16 0653 12/25/16 0353 12/26/16 0258  WBC 8.4 13.3* 11.0*  HGB 7.4* 8.6* 8.6*  HCT 23.7* 27.0* 27.4*  PLT 251 300 304    Coag's Recent Labs  Lab  12/23/16 0358  APTT 31  INR 1.20    Sepsis Markers Recent Labs  Lab 12/22/16 1415 12/24/16 1514 12/25/16 0353 12/26/16 0258  LATICACIDVEN 2.0* 1.3  --   --   PROCALCITON  --  0.13 0.27 <0.10    ABG Recent Labs  Lab 12/23/16 1133 12/24/16 0335 12/25/16 0535  PHART 7.403 7.391 7.374  PCO2ART 48.5* 40.3 40.8  PO2ART 352.0* 96.8 107    Liver Enzymes Recent Labs  Lab 12/22/16 1415 12/24/16 0653 12/25/16 0353 12/26/16 0258  AST 16 15  --  35  ALT 14 15  --  32  ALKPHOS 95 90  --  163*  BILITOT 0.5 0.5  --  0.6  ALBUMIN 2.2* 1.6* 1.8* 1.8*  1.8*    Cardiac Enzymes Recent Labs  Lab 12/21/16 2336 12/22/16 0502  TROPONINI <0.03 <0.03    Glucose Recent Labs  Lab 12/25/16 1651 12/25/16 2010 12/26/16 0031 12/26/16 0339 12/26/16 0804 12/26/16 1147  GLUCAP 199* 203* 163* 160* 168* 179*    Imaging No results found.   STUDIES:  CT abd/pelvis 11/03 >> inflammatory process in stomach, large hiatal hernia, 6.2 cm Rt renal mass TTE  11/09 >> EF 55 to 60% CT chest/abd/pelvis 11/10 >> LLL ATX, mass in stomach, HH, Rt renal mass, changes of cirrhosis  CULTURES: Influenza 11/09 >> negative Blood 11/09 >> Urine 11/09 >> negative Sputum 11/09 >>   ANTIBIOTICS: Vancomycin 11/09 >> Zosyn 11/09 >>   SIGNIFICANT EVENTS: 11/02 Admit 11/04 EGD 11/09 To ICU 11/11 Bradycardia >> held lopressor  LINES/TUBES: Rt PICC 11/04 >> ETT 11/09 >>   DISCUSSION: 66 yo female with VDRF from aspiration pneumonia in setting of gastric mass with hiatal hernia.  Hx of ETOH with changes of cirrhosis on CT abdomen.  Also has incidental finding of Rt renal mass.  ASSESSMENT / PLAN:  Acute hypoxic respiratory failure 2nd to aspiration pneumonia. - full vent support - f/u CXR - day 4 of Abx  Gastric mass, hiatal hernia. Moderate protein calorie malnutrition. - [facility] will contact [facility] to determine if she needs to be transferred there  Hx of ETOH with cirrhosis. - f/u  LFTs  Acute metabolic encephalopathy. - RASS goal 0 to -1  PAF. Hx of HTN. - amiodarone per cardiology - prn hydralazine for SBP > 170, DBP > 105  Anemia of critical illness and chronic disease. - f/u CBC  Hyperglycemia. - SSI  DVT prophylaxis - SCDs SUP - Protonix Nutrition - TPN Goals of care - full code  D/w Dr. Georgette Dover   CC time 46 minutes  Chesley Mires, MD [place] 12/26/2016, 12:15 PM Pager:  [phone] After 3pm call: [phone]

## 2016-12-26 NOTE — Progress Notes (Signed)
PHARMACY - ADULT TOTAL PARENTERAL NUTRITION CONSULT NOTE   Pharmacy Consult:  TPN Indication:  Prolonged NPO status / GOO  Patient Measurements: Height: 5' 8"  (172.7 cm) Weight: 219 lb 9.3 oz (99.6 kg) IBW/kg (Calculated) : 63.9 TPN AdjBW (KG): 74.4 Body mass index is 33.39 kg/m.  Assessment:  Nicole [name] presented on 12/16/16 with N/V and essentially no PO intake x4 days.  CT shows gastric mass and EGD concerning for malignant gastric tumor.  Per Surgery, if GIST then consider Gleevec to shrink tumor and will need J-tube for EN; if adenocarcinoma then will need additional work-up.  While awaiting biopsy result to determine care, Pharmacy consulted to initiate TPN for nutritional support.  Noted patient has a history of EtOH use.  GI: Hx GERD, presented with hematemesis on Protonix + Pepcid. New large gastric body mass causing GOO and hiatal hernia. OR scheduled for 11/9 but has been postponed due to intubation and ICU admission. Plan for laparotomy, partial vs total gastrectomy, possible thoracotomy, LBM 11/8, +flatus.  Albumin 1.8, prealbumin 16.6. Scheduled Zofran Endo: thyroid gland mass on CT.  No hx DM - CBGs became uncontrolled yesterday (Solumedrol was added around the same time) Now CBGs dropping down to mid 100s after new TPN started (lower dextrose concentration and Lantus 10 was added)  Insulin requirements in the past 24 hours: 27 units rSSI + Lantus 10 units + 30 units regular insulin in the TPN.  Lytes: wnl today. K trended up to 5 again. CoCa 10. Phos 4.2 and Mg borderline high at 2.6. Renal: new renal mass. SCr stable, UOP increased to 0.8 ml/kg/hr, Net +12.9 L since admit (+1.3 L yesterday). S/p lasix on 11/6-8. BUN trending up to 43 (may need to decrease protein if continues to rise) Pulm: small pleural effusion on CT. RA>Ackworth. xopenex Cards: HTN - Afib with RVR on admit, no AC d/t hematemesis - BP nl-elevated, HR 50s, amio drip, lopressor IV prn Hepatobil: LFTs / tbili wnl, Alk  Phos up to 163, TG up to 162, INR 1.2 Neuro: EtOH - thiamine, folate, multivitamin in TPN.  PRN morphine/Ativan ID: afebrile, WBC up to 11 - D#4 Vanc + Zosyn Best Practices: SCDs TPN Access: PICC placed 12/18/16  TPN start date: 12/18/16  Nutritional Goals (RD rec on 11/9): Kcal: 0354-6568 Protein: > 132 g per day Fluid: per MD  Current Nutrition:  NPO TPN   Plan: Continue TPN at 60 ml/hr tonight Decrease protein to 120 g for rising BUN This TPN provides 120 g AA (Clinisol), 173 g CHO, and 39 g of lipids, which adds up to 1,454 kCal and meets 100% of patient needs Lytes in TPN: decrease K slightly, Mg reduced 11/11, continue LE:XNTZGYF to 1:2 Daily multivitamin and trace elements in TPN Add thiamine 749SW and folic acid 83m to TPN Add famotidine 434mto TPN Continue moderate SSI Q6H + increase to 35 units of regular insulin in TPN Monitor TPN labs, watch TG F/U plans for nutrition post-op, iron supplementation when possible   JeRenold GentaPharmD, BCPS Clinical Pharmacist Phone for today - x2[place] [phone]/01/2017 7:35 AM

## 2016-12-27 LAB — GLUCOSE, CAPILLARY
GLUCOSE-CAPILLARY: 172 mg/dL — AB (ref 65–99)
GLUCOSE-CAPILLARY: 160 mg/dL — AB (ref 65–99)
Glucose-Capillary: 131 mg/dL — ABNORMAL HIGH (ref 65–99)
Glucose-Capillary: 134 mg/dL — ABNORMAL HIGH (ref 65–99)
Glucose-Capillary: 160 mg/dL — ABNORMAL HIGH (ref 65–99)
Glucose-Capillary: 165 mg/dL — ABNORMAL HIGH (ref 65–99)

## 2016-12-27 LAB — MAGNESIUM: Magnesium: 2 mg/dL (ref 1.7–2.4)

## 2016-12-27 LAB — BASIC METABOLIC PANEL
Anion gap: 8 (ref 5–15)
BUN: 42 mg/dL — ABNORMAL HIGH (ref 6–20)
CHLORIDE: 103 mmol/L (ref 101–111)
CO2: 23 mmol/L (ref 22–32)
CREATININE: 1.02 mg/dL — AB (ref 0.44–1.00)
Calcium: 8 mg/dL — ABNORMAL LOW (ref 8.9–10.3)
GFR, EST NON AFRICAN AMERICAN: 56 mL/min — AB (ref >60)
Glucose, Bld: 190 mg/dL — ABNORMAL HIGH (ref 65–99)
POTASSIUM: 4.8 mmol/L (ref 3.5–5.1)
SODIUM: 134 mmol/L — AB (ref 135–145)

## 2016-12-27 MED ORDER — ACETAMINOPHEN 650 MG RE SUPP: 650.0000 mg | RECTAL | 0 refills | Status: DC | PRN

## 2016-12-27 MED ORDER — METHYLPREDNISOLONE SODIUM SUCC 40 MG IJ SOLR: 40.0000 mg | Freq: Every day | INTRAMUSCULAR | 0 refills | Status: DC

## 2016-12-27 MED ORDER — FENTANYL BOLUS VIA INFUSION: 25.0000 ug | INTRAVENOUS | 0 refills | Status: DC | PRN

## 2016-12-27 MED ORDER — HYDRALAZINE HCL 20 MG/ML IJ SOLN
10.0000 mg | INTRAMUSCULAR | Status: DC | PRN
Start: 2016-12-27 — End: 2017-11-16

## 2016-12-27 MED ORDER — ORAL CARE MOUTH RINSE: 15.0000 mL | Freq: Four times a day (QID) | OROMUCOSAL | 0 refills | Status: DC

## 2016-12-27 MED ORDER — AMIODARONE HCL IN DEXTROSE 360-4.14 MG/200ML-% IV SOLN: 30.0000 mg/h | INTRAVENOUS | Status: DC

## 2016-12-27 MED ORDER — CHLORHEXIDINE GLUCONATE 0.12% ORAL RINSE (MEDLINE KIT): 15.0000 mL | Freq: Two times a day (BID) | OROMUCOSAL | 0 refills | Status: DC

## 2016-12-27 MED ORDER — CHLORHEXIDINE GLUCONATE CLOTH 2 % EX PADS: 6.0000 | MEDICATED_PAD | Freq: Every day | CUTANEOUS | Status: DC

## 2016-12-27 MED ORDER — DEXMEDETOMIDINE HCL IN NACL 400 MCG/100ML IV SOLN: 0.4000 ug/kg/h | INTRAVENOUS | Status: DC

## 2016-12-27 MED ORDER — PANTOPRAZOLE SODIUM 40 MG IV SOLR: 40.0000 mg | Freq: Two times a day (BID) | INTRAVENOUS | Status: DC

## 2016-12-27 MED ORDER — TRACE MINERALS CR-CU-MN-SE-ZN 10-1000-500-60 MCG/ML IV SOLN
INTRAVENOUS | Status: DC
  Administered 2016-12-27: 17:00:00 via INTRAVENOUS
  Filled 2016-12-27: qty 796.8

## 2016-12-27 MED ORDER — VANCOMYCIN HCL 10 G IV SOLR: 1250.0000 mg | INTRAVENOUS | Status: DC

## 2016-12-27 MED ORDER — PIPERACILLIN-TAZOBACTAM 3.375 G IVPB: 3.3750 g | Freq: Three times a day (TID) | INTRAVENOUS | Status: DC

## 2016-12-27 MED ORDER — FENTANYL 2500MCG IN NS 250ML (10MCG/ML) PREMIX INFUSION: 25.0000 ug/h | INTRAVENOUS | Status: DC

## 2016-12-27 MED ORDER — INSULIN ASPART 100 UNIT/ML ~~LOC~~ SOLN: 0.0000 [IU] | SUBCUTANEOUS | 11 refills | Status: DC

## 2016-12-27 MED ORDER — VANCOMYCIN HCL 10 G IV SOLR
1250.0000 mg | INTRAVENOUS | Status: DC
  Administered 2016-12-27: 1250 mg via INTRAVENOUS
  Filled 2016-12-27: qty 1250

## 2016-12-27 MED ORDER — LEVALBUTEROL HCL 0.63 MG/3ML IN NEBU
0.6300 mg | INHALATION_SOLUTION | Freq: Four times a day (QID) | RESPIRATORY_TRACT | 12 refills | Status: DC
Start: 2016-12-27 — End: 2017-11-16

## 2016-12-27 MED ORDER — MIDAZOLAM HCL 2 MG/2ML IJ SOLN: 1.0000 mg | INTRAMUSCULAR | 0 refills | Status: DC | PRN

## 2016-12-27 MED ORDER — METHYLPREDNISOLONE SODIUM SUCC 40 MG IJ SOLR: 40.0000 mg | Freq: Every day | INTRAMUSCULAR | Status: DC

## 2016-12-27 MED ORDER — INSULIN GLARGINE 100 UNIT/ML ~~LOC~~ SOLN: 10.0000 [IU] | SUBCUTANEOUS | 11 refills | Status: DC

## 2016-12-27 NOTE — Progress Notes (Signed)
PULMONARY / CRITICAL CARE MEDICINE   Name: Nicole [name] MRN: [id number] DOB: [birth year]    ADMISSION DATE:  12/16/2016 CONSULTATION DATE:  12/23/2016  REFERRING MD:  Dr. Posey Pronto, [facility]  CHIEF COMPLAINT:  Short of breath  HISTORY OF PRESENT ILLNESS:   66 yo female with hx of ETOH presented with dyspnea and abdominal pain.  She was found to have gastric mass and hiatal hernia.  She developed respiratory failure from aspiration pneumonia.  SUBJECTIVE:  Pt indicates back pain.  RN concerned about tachycardia / questionable AFlutter (SR currently).  Tmax 99.2.  Remains on TPN/lipids, fentanyl at 250 mcg, precedex at 0.7 mcg/kg/min  VITAL SIGNS: BP (!) 145/85   Pulse (!) 101   Temp 99.2 F (37.3 C) (Oral)   Resp 16   Ht 5\' 8"  (1.727 m)   Wt 219 lb 5.7 oz (99.5 kg)   SpO2 98%   BMI 33.35 kg/m   HEMODYNAMICS: CVP:  [8 mmHg] 8 mmHg  VENTILATOR SETTINGS: Vent Mode: PRVC FiO2 (%):  [40 %] 40 % Set Rate:  [16 bmp] 16 bmp Vt Set:  [510 mL] 510 mL PEEP:  [5 cmH20] 5 cmH20 Plateau Pressure:  [11 cmH20-16 cmH20] 11 cmH20  INTAKE / OUTPUT: I/O last 3 completed shifts: In: 5350.2 [I.V.:4214.9; Other:40; IV Piggyback:1095.3] Out: 3755 [Urine:3755]  PHYSICAL EXAMINATION: General: chronically ill appearing female in NAD on vent HEENT: MM pink/moist, ETT Neuro: Awakens to voice, follows commands, MAE  CV: s1s2 rrr, no m/r/g PULM: even/non-labored, lungs bilaterally clear GI: soft, non-tender, bsx4 active  Extremities: warm/dry, no edema  Skin: no rashes or lesions  LABS:  BMET Recent Labs  Lab 12/25/16 0353 12/25/16 1026 12/26/16 0258  NA 138 136 139  138  K 5.0 4.3 5.0  5.0  CL 108 108 109  109  CO2 23 22 23   21*  BUN 34* 36* 42*  43*  CREATININE 0.87 0.92 0.99  0.99  GLUCOSE 140* 183* 176*  175*    Electrolytes Recent Labs  Lab 12/24/16 0653 12/25/16 0353 12/25/16 1026 12/26/16 0258 12/26/16 2153  CALCIUM 7.4* 8.0* 7.9* 8.3*  8.3*  --   MG 2.3 2.6*   --  2.6* 2.0  PHOS 3.3 3.7  --  4.2  --     CBC Recent Labs  Lab 12/24/16 0653 12/25/16 0353 12/26/16 0258  WBC 8.4 13.3* 11.0*  HGB 7.4* 8.6* 8.6*  HCT 23.7* 27.0* 27.4*  PLT 251 300 304    Coag's Recent Labs  Lab 12/23/16 0358  APTT 31  INR 1.20    Sepsis Markers Recent Labs  Lab 12/22/16 1415 12/24/16 1514 12/25/16 0353 12/26/16 0258  LATICACIDVEN 2.0* 1.3  --   --   PROCALCITON  --  0.13 0.27 <0.10    ABG Recent Labs  Lab 12/23/16 1133 12/24/16 0335 12/25/16 0535  PHART 7.403 7.391 7.374  PCO2ART 48.5* 40.3 40.8  PO2ART 352.0* 96.8 107    Liver Enzymes Recent Labs  Lab 12/22/16 1415 12/24/16 0653 12/25/16 0353 12/26/16 0258  AST 16 15  --  35  ALT 14 15  --  32  ALKPHOS 95 90  --  163*  BILITOT 0.5 0.5  --  0.6  ALBUMIN 2.2* 1.6* 1.8* 1.8*  1.8*    Cardiac Enzymes Recent Labs  Lab 12/21/16 2336 12/22/16 0502  TROPONINI <0.03 <0.03    Glucose Recent Labs  Lab 12/26/16 1147 12/26/16 1527 12/26/16 2010 12/27/16 0007 12/27/16 0409 12/27/16 0819  GLUCAP 179*  166* 144* 165* 160* 134*    Imaging No results found.   STUDIES:  CT abd/pelvis 11/03 >> inflammatory process in stomach, large hiatal hernia, 6.2 cm Rt renal mass TTE 11/09 >> EF 55 to 60% CT chest/abd/pelvis 11/10 >> LLL ATX, mass in stomach, HH, Rt renal mass, changes of cirrhosis  CULTURES: Influenza 11/09 >> negative Blood 11/09 >> Urine 11/09 >> negative Sputum 11/09 >> multiple organisms present, none predominant   ANTIBIOTICS: Vancomycin 11/09 >> Zosyn 11/09 >>   SIGNIFICANT EVENTS: 11/02 Admit 11/04 EGD 11/09 To ICU 11/11 Bradycardia >> held lopressor  LINES/TUBES: Rt PICC 11/04 >> ETT 11/09 >>   DISCUSSION: 66 yo female with VDRF from aspiration pneumonia in setting of gastric mass with hiatal hernia.  Hx of ETOH with changes of cirrhosis on CT abdomen.  Also has incidental finding of Rt renal mass.  ASSESSMENT / PLAN:  Acute hypoxic  respiratory failure 2nd to aspiration pneumonia. - PRVC 8 cc/kg  - Daily SBT/WUA - Follow intermittent CXR  - Day 5/x abx - Reduce steroids to 40mg  QD, rapid wean to off  Gastric mass, large hiatal hernia. Moderate protein calorie malnutrition. - Recommendations per [facility]  - Surgery contacting [facility] for potential transfer   Hx of ETOH with cirrhosis. - Follow LFT's  - Thiamine, folate in TPN  Acute metabolic encephalopathy. - RASS Goal: 0 to -1  - Precedex for sedation  - PRN versed for sedation - Fentanyl gtt for pain   PAF. Hx of HTN. - Amiodarine gtt per Cardiology  - PRN hydralazine for SBP > 170, DBP >105  Anemia of critical illness and chronic disease. - Follow CBC   Hyperglycemia. - SSI   DVT prophylaxis - SCDs SUP - Protonix Nutrition - TPN Goals of care - full code Global - care plan discussed with Dr. Georgette Dover   CC Time: 80 minutes   Noe Gens, NP-C [place] Pgr: [phone] or if no answer [phone] 12/27/2016, 9:52 AM

## 2016-12-27 NOTE — Progress Notes (Signed)
9 Days Post-Op   Subjective/Chief Complaint: Remains intubated Weaning some, but requiring sedation for agitation No BM On TNA  Objective: Vital signs in last 24 hours: Temp:  [98.1 F (36.7 C)-99.2 F (37.3 C)] 99.2 F (37.3 C) (11/13 0822) Pulse Rate:  [49-126] 101 (11/13 0800) Resp:  [14-35] 16 (11/13 0800) BP: (134-176)/(78-139) 145/85 (11/13 0906) SpO2:  [96 %-100 %] 98 % (11/13 0800) FiO2 (%):  [40 %] 40 % (11/13 0758) Weight:  [99.5 kg (219 lb 5.7 oz)] 99.5 kg (219 lb 5.7 oz) (11/13 0400) Last BM Date: 12/22/16  Intake/Output from previous day: 11/12 0701 - 11/13 0700 In: 3550.3 [I.V.:2873.1; IV Piggyback:637.3] Out: 2800 [Urine:2800] Intake/Output this shift: Total I/O In: 243.8 [I.V.:243.8] Out: 500 [Urine:500]  PE:  WDWN  Intubated, sedated during bath CV - RRR Lungs - decreased left basilar breath sounds; Abd - soft, non-tender   Lab Results:  Recent Labs    12/25/16 0353 12/26/16 0258  WBC 13.3* 11.0*  HGB 8.6* 8.6*  HCT 27.0* 27.4*  PLT 300 304   BMET Recent Labs    12/25/16 1026 12/26/16 0258  NA 136 139  138  K 4.3 5.0  5.0  CL 108 109  109  CO2 22 23  21*  GLUCOSE 183* 176*  175*  BUN 36* 42*  43*  CREATININE 0.92 0.99  0.99  CALCIUM 7.9* 8.3*  8.3*   PT/INR No results for input(s): LABPROT, INR in the last 72 hours. ABG Recent Labs    12/25/16 0535  PHART 7.374  HCO3 23.3    Studies/Results: No results found.  Anti-infectives: Anti-infectives (From admission, onward)   Start     Dose/Rate Route Frequency Ordered Stop   12/27/16 1400  vancomycin (VANCOCIN) IVPB 750 mg/150 ml premix     750 mg 150 mL/hr over 60 Minutes Intravenous Every 12 hours 12/26/16 2252     12/23/16 2200  vancomycin (VANCOCIN) 1,250 mg in sodium chloride 0.9 % 250 mL IVPB  Status:  Discontinued     1,250 mg 166.7 mL/hr over 90 Minutes Intravenous Every 12 hours 12/23/16 0840 12/26/16 2252   12/23/16 1500  piperacillin-tazobactam (ZOSYN)  IVPB 3.375 g     3.375 g 12.5 mL/hr over 240 Minutes Intravenous Every 8 hours 12/23/16 0840     12/23/16 0900  vancomycin (VANCOCIN) 1,750 mg in sodium chloride 0.9 % 500 mL IVPB     1,750 mg 250 mL/hr over 120 Minutes Intravenous  Once 12/23/16 0836 12/23/16 1252   12/23/16 0900  piperacillin-tazobactam (ZOSYN) IVPB 3.375 g     3.375 g 100 mL/hr over 30 Minutes Intravenous  Once 12/23/16 0836 12/23/16 1113   12/23/16 0600  cefoTEtan in Dextrose 5% (CEFOTAN) IVPB 2 g     2 g Intravenous To Surgery 12/22/16 1401 12/24/16 0600   12/17/16 0515  piperacillin-tazobactam (ZOSYN) IVPB 3.375 g     3.375 g 100 mL/hr over 30 Minutes Intravenous  Once 12/17/16 0503 12/17/16 0636      Assessment/Plan: Large hiatal hernia with most of stomach in chest Large gastric mass - mucosal biopsies show only chronic gastritis; CT appearance may be consistent with leiomyoma measuring 11.4 x 7.1 x 8.2 cm VDRF probably due to chronic aspiration - still requiring significant vent support Chronic alcoholism - CT shows changes consistent with cirrhosis, some ascites, but T. Bili normal  Patient is not ready for surgery yet, and her surgery will be quite complex.  [facility]  I will try to speak to one of their surgeons today.    LOS: 10 days    Sharyah Bostwick K. 12/27/2016

## 2016-12-27 NOTE — Progress Notes (Signed)
Pharmacy Antibiotic Note  Nicole [name] is a 66 y.o. female admitted on 12/16/2016 with sepsis.  Pharmacy has been consulted for vancomycin and zosyn dosing.  Vancomycin trough yesterday elevated at 34 mcg/mL and renal function continues to trend up slowly.  UOP also less.  She remains afebrile and her WBC is stable.   Plan: Change vanc to 1250mg  IV Q24H Continue Zosyn EI 3.375 gm IV Q8H Monitor renal fxn, clinical progress No repeat VT unless SCr continues to trend up, abx stop date 12/29/16   Height: 5\' 8"  (172.7 cm) Weight: 219 lb 5.7 oz (99.5 kg) IBW/kg (Calculated) : 63.9  Temp (24hrs), Avg:98.9 F (37.2 C), Min:98.1 F (36.7 C), Max:99.2 F (37.3 C)  Recent Labs  Lab 12/22/16 0502 12/22/16 1415 12/23/16 0358 12/24/16 0653 12/24/16 1514 12/25/16 0353 12/25/16 1026 12/26/16 0258 12/26/16 2153 12/27/16 1028  WBC 10.2  --  14.4* 8.4  --  13.3*  --  11.0*  --   --   CREATININE 0.57 0.62 0.65 0.83  --  0.87 0.92 0.99  0.99  --  1.02*  LATICACIDVEN  --  2.0*  --   --  1.3  --   --   --   --   --   VANCOTROUGH  --   --   --   --   --   --   --   --  34*  --       Zosyn 11/9 >> (11/15) Vanc 1/9 >> (11/15)  11/12 VT = 34 mcg/mL on 1250mg  q12 >> 1250mg  q24 (SCr 0.9)   11/9 BCx - NGTD 11/9 UCx - negative 11/9 TA - mult org, none predominant 11/3 MRSA PCR: negative   Nicole [name] D. Nicole [name], PharmD, BCPS Pager:  [phone] 12/27/2016, 12:54 PM

## 2016-12-27 NOTE — Progress Notes (Signed)
Got note from [facility] offices from patients' husband [name].  He said their son Nicole [name] was there with her.  Not sure when call came in but responded as soon as possible.   Went to visit son and patient.  Patient is not aware of what is going on. Son says she has been here about 1 week.  The plan is to move her to [facility] as soon as an opening is available.  Had prayer with the patient and son. Conard Novak, Chaplain    12/27/16 1600  Clinical Encounter Type  Visited With Patient and family together  Visit Type Initial  Referral From Family  Consult/Referral To Chaplain  Spiritual Encounters  Spiritual Needs Prayer;Emotional  Stress Factors  Patient Stress Factors None identified  Family Stress Factors None identified

## 2016-12-27 NOTE — Discharge Summary (Signed)
Physician Discharge Summary  Patient ID: Nicole [name] MRN: [id number] DOB/AGE: [birth year] 66 y.o.  Admit date: 12/16/2016 Discharge date: 12/27/2016  Discharge Diagnoses:  Acute Hypoxic Respiratory Failure secondary to Aspiration Pneumonia  Gastric Mass  Large Hiatal Hernia Morbid Obesity  Moderate Protein Calorie Malnutrition   ETOH Cirrhosis  Acute Metabolic Encephalopathy  Paroxysmal Atrial Fibrillation  HTN Anemia of Critical Illness  Hyperglycemia                                                                     DISCHARGE PLAN BY DIAGNOSIS      Acute hypoxic respiratory failure 2nd to aspiration pneumonia. Discharge Plan: - Continue PRVC 8 cc/kg for transport  - Daily SBT/WUA when able  - Follow intermittent CXR  - Day 5/7 abx (11/13) - Reduce steroids to 9m QD, rapid wean to off  Gastric mass, large hiatal hernia. Moderate protein calorie malnutrition. [facility] Plan: [facility]  - See surgical summary above   Hx of ETOH with cirrhosis. Discharge Plan: - Follow LFT's   Acute metabolic encephalopathy. Discharge Plan: - RASS Goal: 0 to -1  - Precedex for sedation  - PRN versed for sedation - Fentanyl gtt for pain   PAF. Hx of HTN. Discharge Plan: - Amiodarine gtt at 375mhr - Consider Cardiology consultation  - PRN hydralazine for SBP > 170, DBP >105  Anemia of critical illness and chronic disease. 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  Nicole [name] was evaluated with a CT of the abdomen and pelvis which demonstrated an inflammatory process in the upper abdomen, a large hiatal hernia which contained a gastric filling defect raising concern for gastric mass, bezoar or hematoma, and heterogeneous 6.2 cm right renal mass.  The patient was admitted by the hospitalist service for further evaluation.  Dr. ScMichail Sermonrom GI was consulted and planned for an EGD (11/4).  During EGD he was unable to pass the scope beyond the gastric mass.  Mucosal biopsies only demonstrated chronic gastritis.  Nicole [name] initially was scheduled for surgery on 11/9 for evaluation of gastric mass and liberation of hiatal hernia.  Unfortunately, on the am of 11/9 Nicole [name] developed respiratory distress and was urgently intubated.  There were concerns of possible aspiration with noted aspiration material on cords upon intubation (despite NGT placement prior to intubation).  Upon placement of NG tube / ETT there was also return of foul-smelling purulent secretions.    CT of the chest/ABD/pelvis reassessed on 11/10 which demonstrated complete atelectasis of the left lower lobe, associated small pleural effusion, dependent atelectasis in the right lower lobe.  Inflammatory changes noted in the upper abdomen surrounding the gastric antrum and abutting the superior aspect of the pancreas  and left liver lobe, irregular area within the gastric antrum appears inflammatory, cyst within the pancreatic body and proximal tail (inflammatory changes appeared improved compared to prior CT scan), ill-defined hypoattenuating area in the segment of the liver which was new from prior CT likely inflammatory in etiology, 5.8 cm right renal mass concerning for renal cell carcinoma (appears contained within Gerota's fascia and no evidence of associated adenopathy or venous thrombosis, no evidence of metastatic disease), gastric mass unchanged and moderate to large hiatal hernia.    The patient was stabilized  in the intensive care unit.  Nicole [name] was maintained on TPN for nutrition.  ICU course complicated by A. fib with RVR requiring amiodarone drip.  Nicole [name] has been on empiric antibiotics for aspiration pneumonia.  Cultures to date are negative.  FiO2 / ventilator needs have been stable.  Given concerns for complexity of her surgical needs decision was made to transfer for tertiary facility for evaluation.  Patient was medically cleared for transfer to tertiary facility on 11/13 with plans as above.   SURGICAL DISCHARGE SUMMARY - Per Dr. Georgette Dover I have spoken with Dr. Jyl Heinz, Surgical Oncology service at [facility] who has accepted the patient in transfer.    Here is a brief summary of her case:  Admission 12/17/16  PMH:  Hypertension (doesn't go to the doctor very often)             Hiatal hernia             Arthritis PSH - None Non-smoker Meds PTA - Tylenol, Calcium, Mucinex, Zestoretic, Protonix  66 year old female who does not go to the physician comes in with a 5-6-day history of continuous vomiting along with some hematemesis and upper abdominal pain. Nicole [name] reports a one-year history of early satiety. Nicole [name] states for the past 5 or 6 days prior to admission Nicole [name] has not been able to keep any solid food down and really not any liquids down either. Nicole [name] went to Urgent Care in [place] other day and was evaluated and given IV fluids and nausea medicine.Nicole [name] does report chronic NSAID use. Nicole [name] takes about 2 tablets of ibuprofen daily for years mainly for joint pain. Nicole [name] also reports heavy drinking over the past several years. Nicole [name] denies any episodes of DTs or withdrawals. Nicole [name] denies any prior abdominal surgery. Nicole [name] denies any significant weight loss. Nicole [name] denies any family history other than diabetes. Nicole [name] has never had an upper endoscopy or lower endoscopy. Nicole [name] does not smoke. Nicole [name] denies any melena or hematochezia. Nicole [name] also complains of weakness since Nicole [name] is not been able  to eat. Nicole [name] denies any fevers or chills.  Nicole [name] was found to have a large hiatal hernia with a large (11.4 cm) mass in the wall of the stomach.  Nicole [name] had some hematemesis as well.  CT also showed nodularity of the liver, consistent with cirrhosis due to her long history of alcohol use.  Nicole [name] also has a large right renal mass, suspicious for renal cell carcinoma.  EGD on 12/18/16 showed a large circumferential mass in the gastric body with no sign of bleeding or recent bleeding.  The distal esophagus shows some extrinsic compression due to the large hiatal hernia.  Mucosal biopsies showed only chronic gastritis.  Nicole [name] has been on TPN for nutritional support.  Unfortunately, Nicole [name] aspirated and developed respiratory distress.  Nicole [name] was intubated on 12/23/16.  Nicole [name] has improved slowly over the last few days and is now on minimal support.  Nicole [name]  requires sedation for agitation.  Nicole [name] is on day 5/7 of Vanc/ Zosyn for aspiration pneumonia.  Because of the complexity of her expected surgical procedure, and the concurrent probable renal cell carcinoma, Nicole [name] is being transferred to a tertiary medical center.  Imogene Burn. Georgette Dover, MD, [facility] Surgery  General/ Trauma Surgery  12/27/2016 3:10 PM   STUDIES:  SEE ATTACHED IMAGES / COPIES of STUDIES CT abd/pelvis 11/03 >> inflammatory process in stomach, large hiatal hernia, 6.2 cm Rt renal mass  TTE 11/09 >> EF 55 to 60%  CT chest/abd/pelvis 11/10 >> LLL ATX, mass in stomach, HH, Rt renal mass, changes of cirrhosis    CULTURES:  Influenza 11/09 >> negative  Blood 11/09 >>  Urine 11/09 >> negative  Sputum 11/09 >> multiple organisms present, none predominant   ANTIBIOTICS:  Vancomycin 11/09 >>  Zosyn 11/09 >>   SIGNIFICANT EVENTS:  11/02 Admit  11/04 EGD  11/09 To ICU  11/11 Bradycardia >> held lopressor   LINES/TUBES:  Rt PICC 11/04 >>  ETT 11/09 >>  Discharge Exam: General: morbidly obese female in NAD on mechanical ventilation    HEENT: MM pink/moist, ETT in place, 22cm at lip Neuro: sedate, opens eyes to voice, follows simple commands and then drifts back to sleep  CV: IRIR at 104 PULM: even/non-labored, lungs bilaterally coarse GI: obese/soft, non-tender, bs + Extremities: warm/dry, no edema  Skin: no rashes or lesions   Vitals:   12/27/16 1600 12/27/16 1619 12/27/16 1700 12/27/16 1800  BP: (!) 132/91 (!) 132/91 (!) 162/100 140/88  Pulse: (!) 113 87 94 (!) 117  Resp: 16 17 15 16   Temp:      TempSrc:      SpO2: 97%  99% 98%  Weight:      Height:         Discharge Labs  BMET Recent Labs  Lab 12/22/16 0502  12/23/16 0358 12/24/16 0653 12/25/16 0353 12/25/16 1026 12/26/16 0258 12/26/16 2153 12/27/16 1028  NA 133*   < > 134* 133* 138 136 139  138  --  134*  K 4.0   < > 4.4 4.0 5.0 4.3 5.0  5.0  --  4.8  CL 100*   < > 100* 104 108 108 109  109  --  103  CO2 24   < > 23 24 23 22 23   21*  --  23  GLUCOSE 144*   < > 151* 333* 140* 183* 176*  175*  --  190*  BUN 17   < > 20 30* 34* 36* 42*  43*  --  42*  CREATININE 0.57   < > 0.65 0.83 0.87 0.92 0.99  0.99  --  1.02*  CALCIUM 8.1*   < > 8.2* 7.4* 8.0* 7.9* 8.3*  8.3*  --  8.0*  MG 1.9  --  1.9 2.3 2.6*  --  2.6* 2.0  --   PHOS 4.3  --  3.8 3.3 3.7  --  4.2  --   --    < > = values in this interval not displayed.    CBC Recent Labs  Lab 12/24/16 0653 12/25/16 0353 12/26/16 0258  HGB 7.4* 8.6* 8.6*  HCT 23.7* 27.0* 27.4*  WBC 8.4 13.3* 11.0*  PLT 251 300 304    Anti-Coagulation Recent Labs  Lab 12/23/16 0358  INR 1.20    Discharge Instructions    Amb referral to [facility]   Complete by:  As directed  Allergies as of 12/27/2016      Reactions   Other Shortness Of Breath, Anxiety   Reaction to fast food and processed food   Shellfish Allergy Shortness Of Breath   Iodinated Diagnostic Agents Swelling   Hand swelling from contrast dye for MRI      Medication List    STOP taking these medications    acetaminophen 500 MG tablet Commonly known as:  TYLENOL Replaced by:  acetaminophen 650 MG suppository   CALCIUM 600 + D PO   ferrous sulfate 325 (65 FE) MG tablet   guaiFENesin 600 MG 12 hr tablet Commonly known as:  MUCINEX   ibuprofen 200 MG tablet Commonly known as:  ADVIL,MOTRIN   lisinopril-hydrochlorothiazide 20-25 MG tablet Commonly known as:  PRINZIDE,ZESTORETIC   multivitamin with minerals Tabs tablet   ondansetron 8 MG disintegrating tablet Commonly known as:  ZOFRAN-ODT   pantoprazole 40 MG tablet Commonly known as:  PROTONIX Replaced by:  pantoprazole 40 MG injection   phenylephrine 10 MG Tabs tablet Commonly known as:  SUDAFED PE   sucralfate 1 g tablet Commonly known as:  CARAFATE     TAKE these medications   acetaminophen 650 MG suppository Commonly known as:  TYLENOL Place 1 suppository (650 mg total) every 4 (four) hours as needed rectally for fever. Replaces:  acetaminophen 500 MG tablet   amiodarone 360-4.14 MG/200ML-% Soln Commonly known as:  NEXTERONE PREMIX Inject 16.67 mL/hr continuous into the vein.   chlorhexidine gluconate (MEDLINE KIT) 0.12 % solution Commonly known as:  PERIDEX 15 mLs 2 (two) times daily by Mouth Rinse route.   Chlorhexidine Gluconate Cloth 2 % Pads Apply 6 each daily topically.   dexmedetomidine 400 MCG/100ML Soln Commonly known as:  PRECEDEX Inject 39.24-117.72 mcg/hr continuous into the vein.   fentaNYL Soln Commonly known as:  SUBLIMAZE Inject 25 mcg every hour as needed into the vein.   fentaNYL 10 mcg/ml Soln infusion Inject 25-400 mcg/hr continuous into the vein.   hydrALAZINE 20 MG/ML injection Commonly known as:  APRESOLINE Inject 0.5 mLs (10 mg total) every 4 (four) hours as needed into the vein (SBP > 170, DBP > 105).   insulin aspart 100 UNIT/ML injection Commonly known as:  novoLOG Inject 0-20 Units every 4 (four) hours into the skin.   insulin glargine 100 UNIT/ML injection Commonly  known as:  LANTUS Inject 0.1 mLs (10 Units total) daily into the skin. Start taking on:  12/28/2016   levalbuterol 0.63 MG/3ML nebulizer solution Commonly known as:  XOPENEX Inhale 3 mLs (0.63 mg total) every 6 (six) hours into the lungs.   methylPREDNISolone sodium succinate 40 mg/mL injection Commonly known as:  SOLU-MEDROL Inject 1 mL (40 mg total) daily into the vein. Start taking on:  12/28/2016   midazolam 2 MG/2ML Soln injection Commonly known as:  VERSED Inject 1-4 mLs (1-4 mg total) every 15 (fifteen) minutes as needed into the vein for agitation or sedation (to achieve RASS goal).   mouth rinse Liqd solution 15 mLs QID by Mouth Rinse route. Start taking on:  12/28/2016   pantoprazole 40 MG injection Commonly known as:  PROTONIX Inject 40 mg every 12 (twelve) hours into the vein. Replaces:  pantoprazole 40 MG tablet   piperacillin-tazobactam 3.375 GM/50ML IVPB Commonly known as:  ZOSYN Inject 50 mLs (3.375 g total) every 8 (eight) hours into the vein.   SYSTANE COMPLETE 0.6 % Soln Generic drug:  Propylene Glycol Place 1 drop into both eyes See admin instructions. Instill  one drop into both eyes every morning, may also use later in the day as needed for dry eyes   vancomycin 1,250 mg in sodium chloride 0.9 % 250 mL Inject 1,250 mg daily into the vein. Start taking on:  12/28/2016      Disposition:  [facility] for surgical evaluation, Dr. Jyl Heinz accepting.  Bed assignment 945, surgical/oncology ICU  Discharged Condition: Samira Acero has met maximum benefit of inpatient care and is medically stable and cleared for discharge.  Patient is pending follow up as above.     Time spent on disposition:  45 Minutes.    Kennieth Rad, AGACNP-BC [facility] Pgr: [phone] or if no answer [phone] 12/27/2016, 6:50 PM

## 2016-12-27 NOTE — Progress Notes (Signed)
PHARMACY - ADULT TOTAL PARENTERAL NUTRITION CONSULT NOTE   Pharmacy Consult:  TPN Indication:  Prolonged NPO status / GOO  Patient Measurements: Height: 5' 8"  (172.7 cm) Weight: 219 lb 5.7 oz (99.5 kg) IBW/kg (Calculated) : 63.9 TPN AdjBW (KG): 74.4 Body mass index is 33.35 kg/m.  Assessment:  54 YOF presented on 12/16/16 with N/V and essentially no PO intake x4 days.  CT shows gastric mass and EGD concerning for malignant gastric tumor.  Per Surgery, if GIST then consider Gleevec to shrink tumor and will need J-tube for EN; if adenocarcinoma then will need additional work-up.  While awaiting biopsy result to determine care, Pharmacy consulted to initiate TPN for nutritional support.  Noted patient has a history of EtOH use.  GI: Hx GERD, presented with hematemesis on Protonix + Pepcid. New large gastric body mass causing GOO and hiatal hernia. OR scheduled for 11/9 but has been postponed due to intubation and ICU admission. Plan for laparotomy, partial vs total gastrectomy, possible thoracotomy, LBM 11/8, +flatus.  Albumin 1.8, prealbumin 16.6. Scheduled Zofran Endo: thyroid gland mass on CT.  No hx DM - CBGs became uncontrolled pm 11/9 when started on Solumedrol. Currently CBGs ok (140-170s)  Insulin requirements in the past 24 hours: 23 units rSSI + Lantus 10 units + 35 units regular insulin in the TPN.  Lytes: K trended up to 5 again. CoCa 10. Phos 4.2 and Mg borderline high at 2.6. Renal: new renal mass. SCr stable, UOP increased to 1.2 ml/kg/hr, Net +13.6 L since admit (+700 mL yesterday). S/p lasix on 11/6-8. BUN trending up to 42 (may need to decrease protein if continues to rise) Pulm: small pleural effusion on CT. RA>[place]. xopenex Cards: HTN - Afib with RVR on admit, no AC d/t hematemesis - BP nl-elevated, HR 50s, amio drip, lopressor IV prn Hepatobil: LFTs / tbili wnl, Alk Phos up to 163, TG up to 162, INR 1.2 Neuro: EtOH - thiamine, folate, multivitamin in TPN.  PRN  morphine/Ativan ID: afebrile, WBC up to 11 - D#5 Vanc + Zosyn Best Practices: SCDs TPN Access: PICC placed 12/18/16  TPN start date: 12/18/16  Nutritional Goals (RD rec on 11/9): Kcal: 0903-0149 Protein: > 132 g per day Fluid: per MD  Current Nutrition:  NPO TPN   Plan: Continue TPN at 60 ml/hr This TPN provides 120 g AA (Decreased with rising BUN), 173 g CHO, and 39 g of lipids, which adds up to 1,454 kCal and meets 90% of patient needs Lytes in TPN: decrease K slightly, continue PU:[id number] to 1:2 Daily multivitamin and trace elements in TPN Add thiamine 199VA and folic acid 34m to TPN Add famotidine 451mto TPN Continue moderate SSI Q6H + add 35 units of regular insulin in TPN Monitor TPN labs, Bmet tomorrow F[facility]U surgery recs, [facility]  NaElenor QuinonesPharmD, BCPS Clinical Pharmacist Pager [phone]/13/2018 8:11 AM

## 2016-12-27 NOTE — Progress Notes (Signed)
Patient ID: Nicole [name], female   DOB: 1950-05-08, 66 y.o.   MRN: [id number]  I have spoken with Dr. Jyl Heinz, Surgical Oncology service at [facility] who has accepted the patient in transfer.    Here is a brief summary of her case:  Admission 12/17/16  PMH:  Hypertension (doesn't go to the doctor very often)  Hiatal hernia  Arthritis PSH - None Non-smoker Meds PTA - Tylenol, Calcium, Mucinex, Zestoretic, Protonix   66 year old female who does not go to the physician comes in with a 5-6-day history of continuous vomiting along with some hematemesis and upper abdominal pain.  She reports a one-year history of early satiety.  She states for the past 5 or 6 days prior to admission she has not been able to keep any solid food down and really not any liquids down either.  She went to Urgent Care in [place] the other day and was evaluated and given IV fluids and nausea medicine.  She does report chronic NSAID use.  She takes about 2 tablets of ibuprofen daily for years mainly for joint pain.  She also reports heavy drinking over the past several years.  She denies any episodes of DTs or withdrawals.  She denies any prior abdominal surgery.  She denies any significant weight loss.  She denies any family history other than diabetes.  She has never had an upper endoscopy or lower endoscopy.  She does not smoke.  She denies any melena or hematochezia.  She also complains of weakness since she is not been able to eat.  She denies any fevers or chills.  She was found to have a large hiatal hernia with a large (11.4 cm) mass in the wall of the stomach.  She had some hematemesis as well.  CT also showed nodularity of the liver, consistent with cirrhosis due to her long history of alcohol use.  She also has a large right renal mass, suspicious for renal cell carcinoma.  EGD on 12/18/16 showed a large circumferential mass in the gastric body with no sign of bleeding or recent bleeding.  The  distal esophagus shows some extrinsic compression due to the large hiatal hernia.  Mucosal biopsies showed only chronic gastritis.  She has been on TPN for nutritional support.  Unfortunately, she aspirated and developed respiratory distress.  She was intubated on 12/23/16.  She has improved slowly over the last few days and is now on minimal support.  She requires sedation for agitation.  She is on day 5/7 of Vanc/ Zosyn for aspiration pneumonia.  Because of the complexity of her expected surgical procedure, and the concurrent probable renal cell carcinoma, she is being transferred to a tertiary medical center.  Imogene Burn. Georgette Dover, MD, [facility] Surgery  General/ Trauma Surgery  12/27/2016 3:10 PM

## 2016-12-28 DIAGNOSIS — I4891 Unspecified atrial fibrillation: Secondary | ICD-10-CM | POA: Diagnosis not present

## 2016-12-28 DIAGNOSIS — I10 Essential (primary) hypertension: Secondary | ICD-10-CM | POA: Diagnosis not present

## 2016-12-28 DIAGNOSIS — E878 Other disorders of electrolyte and fluid balance, not elsewhere classified: Secondary | ICD-10-CM | POA: Diagnosis not present

## 2016-12-28 DIAGNOSIS — D649 Anemia, unspecified: Secondary | ICD-10-CM | POA: Diagnosis not present

## 2016-12-28 DIAGNOSIS — Z960 Presence of urogenital implants: Secondary | ICD-10-CM | POA: Diagnosis not present

## 2016-12-28 DIAGNOSIS — K440 Diaphragmatic hernia with obstruction, without gangrene: Secondary | ICD-10-CM | POA: Diagnosis not present

## 2016-12-28 DIAGNOSIS — J690 Pneumonitis due to inhalation of food and vomit: Secondary | ICD-10-CM | POA: Diagnosis not present

## 2016-12-28 DIAGNOSIS — K449 Diaphragmatic hernia without obstruction or gangrene: Secondary | ICD-10-CM | POA: Diagnosis not present

## 2016-12-28 DIAGNOSIS — K311 Adult hypertrophic pyloric stenosis: Secondary | ICD-10-CM | POA: Diagnosis not present

## 2016-12-28 DIAGNOSIS — R451 Restlessness and agitation: Secondary | ICD-10-CM | POA: Diagnosis not present

## 2016-12-28 DIAGNOSIS — E43 Unspecified severe protein-calorie malnutrition: Secondary | ICD-10-CM | POA: Diagnosis not present

## 2016-12-28 DIAGNOSIS — R439 Unspecified disturbances of smell and taste: Secondary | ICD-10-CM | POA: Diagnosis not present

## 2016-12-28 DIAGNOSIS — K863 Pseudocyst of pancreas: Secondary | ICD-10-CM | POA: Diagnosis not present

## 2016-12-28 DIAGNOSIS — D638 Anemia in other chronic diseases classified elsewhere: Secondary | ICD-10-CM | POA: Diagnosis not present

## 2016-12-28 DIAGNOSIS — J9600 Acute respiratory failure, unspecified whether with hypoxia or hypercapnia: Secondary | ICD-10-CM | POA: Diagnosis not present

## 2016-12-28 LAB — CULTURE, BLOOD (ROUTINE X 2)
CULTURE: NO GROWTH
Culture: NO GROWTH
SPECIAL REQUESTS: ADEQUATE
SPECIAL REQUESTS: ADEQUATE

## 2016-12-29 DIAGNOSIS — K449 Diaphragmatic hernia without obstruction or gangrene: Secondary | ICD-10-CM | POA: Diagnosis not present

## 2016-12-29 DIAGNOSIS — Z431 Encounter for attention to gastrostomy: Secondary | ICD-10-CM | POA: Diagnosis not present

## 2016-12-29 DIAGNOSIS — I4891 Unspecified atrial fibrillation: Secondary | ICD-10-CM | POA: Diagnosis not present

## 2016-12-29 DIAGNOSIS — R0603 Acute respiratory distress: Secondary | ICD-10-CM | POA: Diagnosis not present

## 2016-12-29 DIAGNOSIS — J90 Pleural effusion, not elsewhere classified: Secondary | ICD-10-CM | POA: Diagnosis not present

## 2016-12-29 DIAGNOSIS — I517 Cardiomegaly: Secondary | ICD-10-CM | POA: Diagnosis not present

## 2016-12-29 DIAGNOSIS — Z960 Presence of urogenital implants: Secondary | ICD-10-CM | POA: Diagnosis not present

## 2016-12-29 DIAGNOSIS — R935 Abnormal findings on diagnostic imaging of other abdominal regions, including retroperitoneum: Secondary | ICD-10-CM | POA: Diagnosis not present

## 2016-12-29 DIAGNOSIS — R918 Other nonspecific abnormal finding of lung field: Secondary | ICD-10-CM | POA: Diagnosis not present

## 2016-12-29 DIAGNOSIS — Z7901 Long term (current) use of anticoagulants: Secondary | ICD-10-CM | POA: Diagnosis not present

## 2016-12-29 DIAGNOSIS — I639 Cerebral infarction, unspecified: Secondary | ICD-10-CM | POA: Diagnosis not present

## 2016-12-29 DIAGNOSIS — K311 Adult hypertrophic pyloric stenosis: Secondary | ICD-10-CM | POA: Diagnosis not present

## 2016-12-29 MED ORDER — MUPIROCIN 2 % EX OINT
TOPICAL_OINTMENT | CUTANEOUS | Status: DC
Start: 2016-12-29 — End: 2016-12-29

## 2016-12-29 MED ORDER — AMIODARONE HCL IN DEXTROSE 360-4.14 MG/200ML-% IV SOLN
INTRAVENOUS | Status: DC
Start: ? — End: 2016-12-29

## 2016-12-29 MED ORDER — GENERIC EXTERNAL MEDICATION
1.25 g | Status: DC
Start: 2016-12-30 — End: 2016-12-29

## 2016-12-29 MED ORDER — GENERIC EXTERNAL MEDICATION
3.38 g | Status: DC
Start: 2016-12-29 — End: 2016-12-29

## 2016-12-29 MED ORDER — LACTATED RINGERS IV SOLN
INTRAVENOUS | Status: DC
Start: ? — End: 2016-12-29

## 2016-12-29 MED ORDER — HALOPERIDOL LACTATE 5 MG/ML IJ SOLN
1.00 mg | INTRAMUSCULAR | Status: DC
Start: ? — End: 2016-12-29

## 2016-12-29 MED ORDER — CHLORHEXIDINE GLUCONATE 0.12 % MT SOLN
15.00 mL | OROMUCOSAL | Status: DC
Start: ? — End: 2016-12-29

## 2016-12-29 MED ORDER — GENERIC EXTERNAL MEDICATION
Status: DC
Start: ? — End: 2016-12-29

## 2016-12-29 MED ORDER — ENOXAPARIN SODIUM 40 MG/0.4ML ~~LOC~~ SOLN
40.00 mg | SUBCUTANEOUS | Status: DC
Start: 2016-12-29 — End: 2016-12-29

## 2016-12-29 MED ORDER — ENALAPRILAT 1.25 MG/ML IV INJ
0.63 mg | INJECTION | INTRAVENOUS | Status: DC
Start: ? — End: 2016-12-29

## 2016-12-29 MED ORDER — DEXTROSE 10 % IV SOLN
125.00 mL | INTRAVENOUS | Status: DC
Start: ? — End: 2016-12-29

## 2016-12-29 MED ORDER — LABETALOL HCL 5 MG/ML IV SOLN
5.00 mg | INTRAVENOUS | Status: DC
Start: ? — End: 2016-12-29

## 2016-12-29 MED ORDER — CHLORHEXIDINE GLUCONATE 0.12 % MT SOLN
15.00 mL | OROMUCOSAL | Status: DC
Start: 2016-12-29 — End: 2016-12-29

## 2016-12-29 MED ORDER — PANTOPRAZOLE SODIUM 40 MG IV SOLR
40.00 mg | INTRAVENOUS | Status: DC
Start: 2016-12-29 — End: 2016-12-29

## 2016-12-29 MED ORDER — INSULIN LISPRO 100 UNIT/ML ~~LOC~~ SOLN
2.00 | SUBCUTANEOUS | Status: DC
Start: 2016-12-29 — End: 2016-12-29

## 2016-12-30 DIAGNOSIS — I498 Other specified cardiac arrhythmias: Secondary | ICD-10-CM | POA: Diagnosis not present

## 2016-12-30 DIAGNOSIS — I4891 Unspecified atrial fibrillation: Secondary | ICD-10-CM | POA: Diagnosis not present

## 2016-12-30 DIAGNOSIS — K449 Diaphragmatic hernia without obstruction or gangrene: Secondary | ICD-10-CM | POA: Diagnosis not present

## 2016-12-30 DIAGNOSIS — I272 Pulmonary hypertension, unspecified: Secondary | ICD-10-CM | POA: Diagnosis not present

## 2016-12-30 DIAGNOSIS — Z960 Presence of urogenital implants: Secondary | ICD-10-CM | POA: Diagnosis not present

## 2016-12-30 DIAGNOSIS — I10 Essential (primary) hypertension: Secondary | ICD-10-CM | POA: Diagnosis not present

## 2016-12-30 DIAGNOSIS — R4182 Altered mental status, unspecified: Secondary | ICD-10-CM | POA: Diagnosis not present

## 2016-12-30 DIAGNOSIS — K311 Adult hypertrophic pyloric stenosis: Secondary | ICD-10-CM | POA: Diagnosis not present

## 2016-12-31 DIAGNOSIS — K449 Diaphragmatic hernia without obstruction or gangrene: Secondary | ICD-10-CM | POA: Diagnosis not present

## 2016-12-31 DIAGNOSIS — K311 Adult hypertrophic pyloric stenosis: Secondary | ICD-10-CM | POA: Diagnosis not present

## 2016-12-31 DIAGNOSIS — I4892 Unspecified atrial flutter: Secondary | ICD-10-CM | POA: Diagnosis not present

## 2016-12-31 DIAGNOSIS — I4891 Unspecified atrial fibrillation: Secondary | ICD-10-CM | POA: Diagnosis not present

## 2016-12-31 DIAGNOSIS — D638 Anemia in other chronic diseases classified elsewhere: Secondary | ICD-10-CM | POA: Diagnosis not present

## 2017-01-01 DIAGNOSIS — R933 Abnormal findings on diagnostic imaging of other parts of digestive tract: Secondary | ICD-10-CM | POA: Diagnosis not present

## 2017-01-01 DIAGNOSIS — I10 Essential (primary) hypertension: Secondary | ICD-10-CM | POA: Diagnosis not present

## 2017-01-01 DIAGNOSIS — K311 Adult hypertrophic pyloric stenosis: Secondary | ICD-10-CM | POA: Diagnosis not present

## 2017-01-01 DIAGNOSIS — I4891 Unspecified atrial fibrillation: Secondary | ICD-10-CM | POA: Diagnosis not present

## 2017-01-01 DIAGNOSIS — K449 Diaphragmatic hernia without obstruction or gangrene: Secondary | ICD-10-CM | POA: Diagnosis not present

## 2017-01-01 DIAGNOSIS — K2930 Chronic superficial gastritis without bleeding: Secondary | ICD-10-CM | POA: Diagnosis not present

## 2017-01-02 DIAGNOSIS — K449 Diaphragmatic hernia without obstruction or gangrene: Secondary | ICD-10-CM | POA: Diagnosis not present

## 2017-01-02 DIAGNOSIS — K862 Cyst of pancreas: Secondary | ICD-10-CM | POA: Diagnosis not present

## 2017-01-02 DIAGNOSIS — K311 Adult hypertrophic pyloric stenosis: Secondary | ICD-10-CM | POA: Diagnosis not present

## 2017-01-02 DIAGNOSIS — K3189 Other diseases of stomach and duodenum: Secondary | ICD-10-CM | POA: Diagnosis not present

## 2017-01-04 DIAGNOSIS — E041 Nontoxic single thyroid nodule: Secondary | ICD-10-CM | POA: Insufficient documentation

## 2017-01-04 DIAGNOSIS — K863 Pseudocyst of pancreas: Secondary | ICD-10-CM | POA: Insufficient documentation

## 2017-01-05 DIAGNOSIS — E042 Nontoxic multinodular goiter: Secondary | ICD-10-CM | POA: Diagnosis not present

## 2017-01-05 DIAGNOSIS — K311 Adult hypertrophic pyloric stenosis: Secondary | ICD-10-CM | POA: Diagnosis not present

## 2017-01-31 DIAGNOSIS — K862 Cyst of pancreas: Secondary | ICD-10-CM | POA: Diagnosis not present

## 2017-01-31 DIAGNOSIS — N289 Disorder of kidney and ureter, unspecified: Secondary | ICD-10-CM | POA: Diagnosis not present

## 2017-01-31 DIAGNOSIS — K859 Acute pancreatitis without necrosis or infection, unspecified: Secondary | ICD-10-CM | POA: Diagnosis not present

## 2017-01-31 DIAGNOSIS — K449 Diaphragmatic hernia without obstruction or gangrene: Secondary | ICD-10-CM | POA: Diagnosis not present

## 2017-01-31 DIAGNOSIS — K7689 Other specified diseases of liver: Secondary | ICD-10-CM | POA: Diagnosis not present

## 2017-01-31 DIAGNOSIS — M1612 Unilateral primary osteoarthritis, left hip: Secondary | ICD-10-CM | POA: Diagnosis not present

## 2017-01-31 DIAGNOSIS — K863 Pseudocyst of pancreas: Secondary | ICD-10-CM | POA: Diagnosis not present

## 2017-01-31 DIAGNOSIS — K311 Adult hypertrophic pyloric stenosis: Secondary | ICD-10-CM | POA: Diagnosis not present

## 2017-02-23 DIAGNOSIS — N2889 Other specified disorders of kidney and ureter: Secondary | ICD-10-CM | POA: Diagnosis not present

## 2017-02-23 DIAGNOSIS — R197 Diarrhea, unspecified: Secondary | ICD-10-CM | POA: Diagnosis not present

## 2017-02-23 DIAGNOSIS — F419 Anxiety disorder, unspecified: Secondary | ICD-10-CM | POA: Diagnosis not present

## 2017-02-23 DIAGNOSIS — R109 Unspecified abdominal pain: Secondary | ICD-10-CM | POA: Diagnosis not present

## 2017-02-23 DIAGNOSIS — K862 Cyst of pancreas: Secondary | ICD-10-CM | POA: Diagnosis not present

## 2017-02-23 DIAGNOSIS — R112 Nausea with vomiting, unspecified: Secondary | ICD-10-CM | POA: Diagnosis not present

## 2017-02-23 DIAGNOSIS — E0781 Sick-euthyroid syndrome: Secondary | ICD-10-CM | POA: Diagnosis not present

## 2017-02-23 DIAGNOSIS — I480 Paroxysmal atrial fibrillation: Secondary | ICD-10-CM | POA: Diagnosis not present

## 2017-02-23 DIAGNOSIS — E041 Nontoxic single thyroid nodule: Secondary | ICD-10-CM | POA: Diagnosis not present

## 2017-03-07 DIAGNOSIS — E041 Nontoxic single thyroid nodule: Secondary | ICD-10-CM | POA: Diagnosis not present

## 2017-08-14 DIAGNOSIS — R54 Age-related physical debility: Secondary | ICD-10-CM | POA: Diagnosis not present

## 2017-08-14 DIAGNOSIS — N2889 Other specified disorders of kidney and ureter: Secondary | ICD-10-CM | POA: Diagnosis not present

## 2017-08-21 DIAGNOSIS — Z9181 History of falling: Secondary | ICD-10-CM | POA: Diagnosis not present

## 2017-08-21 DIAGNOSIS — D49512 Neoplasm of unspecified behavior of left kidney: Secondary | ICD-10-CM | POA: Diagnosis not present

## 2017-08-21 DIAGNOSIS — I10 Essential (primary) hypertension: Secondary | ICD-10-CM | POA: Diagnosis not present

## 2017-08-21 DIAGNOSIS — E049 Nontoxic goiter, unspecified: Secondary | ICD-10-CM | POA: Diagnosis not present

## 2017-09-04 DIAGNOSIS — K8689 Other specified diseases of pancreas: Secondary | ICD-10-CM | POA: Diagnosis not present

## 2017-09-04 DIAGNOSIS — N2889 Other specified disorders of kidney and ureter: Secondary | ICD-10-CM | POA: Diagnosis not present

## 2017-09-04 DIAGNOSIS — C641 Malignant neoplasm of right kidney, except renal pelvis: Secondary | ICD-10-CM | POA: Diagnosis not present

## 2017-09-11 DIAGNOSIS — C641 Malignant neoplasm of right kidney, except renal pelvis: Secondary | ICD-10-CM | POA: Insufficient documentation

## 2017-09-27 DIAGNOSIS — R569 Unspecified convulsions: Secondary | ICD-10-CM | POA: Diagnosis not present

## 2017-09-27 DIAGNOSIS — R9431 Abnormal electrocardiogram [ECG] [EKG]: Secondary | ICD-10-CM | POA: Diagnosis not present

## 2017-09-27 DIAGNOSIS — I480 Paroxysmal atrial fibrillation: Secondary | ICD-10-CM | POA: Diagnosis not present

## 2017-09-27 DIAGNOSIS — N189 Chronic kidney disease, unspecified: Secondary | ICD-10-CM | POA: Diagnosis not present

## 2017-09-27 DIAGNOSIS — Z79899 Other long term (current) drug therapy: Secondary | ICD-10-CM | POA: Diagnosis not present

## 2017-09-27 DIAGNOSIS — C641 Malignant neoplasm of right kidney, except renal pelvis: Secondary | ICD-10-CM | POA: Diagnosis not present

## 2017-09-27 DIAGNOSIS — Z8249 Family history of ischemic heart disease and other diseases of the circulatory system: Secondary | ICD-10-CM | POA: Diagnosis not present

## 2017-09-27 DIAGNOSIS — Z833 Family history of diabetes mellitus: Secondary | ICD-10-CM | POA: Diagnosis not present

## 2017-09-27 DIAGNOSIS — I129 Hypertensive chronic kidney disease with stage 1 through stage 4 chronic kidney disease, or unspecified chronic kidney disease: Secondary | ICD-10-CM | POA: Diagnosis not present

## 2017-09-27 DIAGNOSIS — Z87891 Personal history of nicotine dependence: Secondary | ICD-10-CM | POA: Diagnosis not present

## 2017-10-04 DIAGNOSIS — R569 Unspecified convulsions: Secondary | ICD-10-CM | POA: Diagnosis not present

## 2017-10-04 DIAGNOSIS — I129 Hypertensive chronic kidney disease with stage 1 through stage 4 chronic kidney disease, or unspecified chronic kidney disease: Secondary | ICD-10-CM | POA: Diagnosis not present

## 2017-10-04 DIAGNOSIS — N2889 Other specified disorders of kidney and ureter: Secondary | ICD-10-CM | POA: Diagnosis not present

## 2017-10-04 DIAGNOSIS — N189 Chronic kidney disease, unspecified: Secondary | ICD-10-CM | POA: Diagnosis not present

## 2017-10-04 DIAGNOSIS — Z87891 Personal history of nicotine dependence: Secondary | ICD-10-CM | POA: Diagnosis not present

## 2017-10-04 DIAGNOSIS — Z79899 Other long term (current) drug therapy: Secondary | ICD-10-CM | POA: Diagnosis not present

## 2017-10-04 DIAGNOSIS — I480 Paroxysmal atrial fibrillation: Secondary | ICD-10-CM | POA: Diagnosis not present

## 2017-10-04 DIAGNOSIS — Z833 Family history of diabetes mellitus: Secondary | ICD-10-CM | POA: Diagnosis not present

## 2017-10-04 DIAGNOSIS — Z8249 Family history of ischemic heart disease and other diseases of the circulatory system: Secondary | ICD-10-CM | POA: Diagnosis not present

## 2017-10-04 DIAGNOSIS — C641 Malignant neoplasm of right kidney, except renal pelvis: Secondary | ICD-10-CM | POA: Diagnosis not present

## 2017-10-09 DIAGNOSIS — R339 Retention of urine, unspecified: Secondary | ICD-10-CM | POA: Diagnosis not present

## 2017-10-09 DIAGNOSIS — Z905 Acquired absence of kidney: Secondary | ICD-10-CM | POA: Diagnosis not present

## 2017-11-13 ENCOUNTER — Emergency Department (HOSPITAL_COMMUNITY)
Admission: EM | Admit: 2017-11-13 | Discharge: 2017-11-13 | Disposition: A | Discharge status: Home / Self Care | Payer: Medicare HMO | Attending: Emergency Medicine | Admitting: Emergency Medicine

## 2017-11-13 ENCOUNTER — Encounter (HOSPITAL_COMMUNITY): Payer: Self-pay | Admitting: Emergency Medicine

## 2017-11-13 ENCOUNTER — Emergency Department (HOSPITAL_COMMUNITY): Payer: Medicare HMO

## 2017-11-13 ENCOUNTER — Other Ambulatory Visit: Payer: Self-pay

## 2017-11-13 DIAGNOSIS — S0232XA Fracture of orbital floor, left side, initial encounter for closed fracture: Secondary | ICD-10-CM | POA: Diagnosis not present

## 2017-11-13 DIAGNOSIS — Z87891 Personal history of nicotine dependence: Secondary | ICD-10-CM | POA: Diagnosis not present

## 2017-11-13 DIAGNOSIS — W19XXXA Unspecified fall, initial encounter: Secondary | ICD-10-CM | POA: Diagnosis not present

## 2017-11-13 DIAGNOSIS — S0993XA Unspecified injury of face, initial encounter: Secondary | ICD-10-CM | POA: Diagnosis not present

## 2017-11-13 DIAGNOSIS — Y939 Activity, unspecified: Secondary | ICD-10-CM | POA: Diagnosis not present

## 2017-11-13 DIAGNOSIS — Z794 Long term (current) use of insulin: Secondary | ICD-10-CM | POA: Diagnosis not present

## 2017-11-13 DIAGNOSIS — S1083XA Contusion of other specified part of neck, initial encounter: Secondary | ICD-10-CM | POA: Diagnosis not present

## 2017-11-13 DIAGNOSIS — S40029A Contusion of unspecified upper arm, initial encounter: Secondary | ICD-10-CM | POA: Diagnosis not present

## 2017-11-13 DIAGNOSIS — Z79899 Other long term (current) drug therapy: Secondary | ICD-10-CM | POA: Insufficient documentation

## 2017-11-13 DIAGNOSIS — I10 Essential (primary) hypertension: Secondary | ICD-10-CM | POA: Insufficient documentation

## 2017-11-13 DIAGNOSIS — Y929 Unspecified place or not applicable: Secondary | ICD-10-CM | POA: Insufficient documentation

## 2017-11-13 DIAGNOSIS — S299XXA Unspecified injury of thorax, initial encounter: Secondary | ICD-10-CM | POA: Diagnosis not present

## 2017-11-13 DIAGNOSIS — Y999 Unspecified external cause status: Secondary | ICD-10-CM | POA: Diagnosis not present

## 2017-11-13 DIAGNOSIS — W010XXA Fall on same level from slipping, tripping and stumbling without subsequent striking against object, initial encounter: Secondary | ICD-10-CM | POA: Insufficient documentation

## 2017-11-13 HISTORY — DX: Anemia, unspecified: D64.9

## 2017-11-13 LAB — BASIC METABOLIC PANEL
Anion gap: 10 (ref 5–15)
BUN: 20 mg/dL (ref 8–23)
CHLORIDE: 101 mmol/L (ref 98–111)
CO2: 23 mmol/L (ref 22–32)
Calcium: 8.9 mg/dL (ref 8.9–10.3)
Creatinine, Ser: 1.3 mg/dL — ABNORMAL HIGH (ref 0.44–1.00)
GFR calc Af Amer: 48 mL/min — ABNORMAL LOW (ref >60)
GFR calc non Af Amer: 41 mL/min — ABNORMAL LOW (ref >60)
GLUCOSE: 136 mg/dL — AB (ref 70–99)
Potassium: 3.9 mmol/L (ref 3.5–5.1)
Sodium: 134 mmol/L — ABNORMAL LOW (ref 135–145)

## 2017-11-13 LAB — CBC WITH DIFFERENTIAL/PLATELET
Basophils Absolute: 0.1 10*3/uL (ref 0.0–0.1)
Basophils Relative: 1 %
EOS ABS: 0.3 10*3/uL (ref 0.0–0.7)
Eosinophils Relative: 5 %
HEMATOCRIT: 30.6 % — AB (ref 36.0–46.0)
HEMOGLOBIN: 9.6 g/dL — AB (ref 12.0–15.0)
LYMPHS ABS: 0.5 10*3/uL — AB (ref 0.7–4.0)
LYMPHS PCT: 7 %
MCH: 30.6 pg (ref 26.0–34.0)
MCHC: 31.4 g/dL (ref 30.0–36.0)
MCV: 97.5 fL (ref 78.0–100.0)
MONOS PCT: 6 %
Monocytes Absolute: 0.4 10*3/uL (ref 0.1–1.0)
NEUTROS PCT: 81 %
Neutro Abs: 5.4 10*3/uL (ref 1.7–7.7)
Platelets: 312 10*3/uL (ref 150–400)
RBC: 3.14 MIL/uL — ABNORMAL LOW (ref 3.87–5.11)
RDW: 15.1 % (ref 11.5–15.5)
WBC: 6.8 10*3/uL (ref 4.0–10.5)

## 2017-11-13 MED ORDER — SODIUM CHLORIDE 0.9 % IV BOLUS
500.0000 mL | Freq: Once | INTRAVENOUS | Status: AC
  Administered 2017-11-13: 500 mL via INTRAVENOUS

## 2017-11-13 MED ORDER — OXYCODONE-ACETAMINOPHEN 5-325 MG PO TABS: 1.0000 | ORAL_TABLET | ORAL | 0 refills | Status: DC | PRN

## 2017-11-13 MED ORDER — CLINDAMYCIN HCL 150 MG PO CAPS: 150.0000 mg | ORAL_CAPSULE | Freq: Four times a day (QID) | ORAL | 0 refills | Status: DC

## 2017-11-13 MED ORDER — VANCOMYCIN HCL 10 G IV SOLR
1750.0000 mg | Freq: Once | INTRAVENOUS | Status: AC
  Administered 2017-11-13: 1750 mg via INTRAVENOUS
  Filled 2017-11-13: qty 1750

## 2017-11-13 NOTE — Progress Notes (Signed)
Pharmacy Note:  Initial antibiotic(s) regimen of Vancomcyin ordered by EDP to treat cellulitis.  Estimated Creatinine Clearance: 47.7 mL/min (A) (by C-G formula based on SCr of 1.3 mg/dL (H)).   Allergies  Allergen Reactions  . Other Shortness Of Breath and Anxiety    Reaction to fast food and processed food  . Shellfish Allergy Shortness Of Breath  . Iodinated Diagnostic Agents Swelling    Hand swelling from contrast dye for MRI    Vitals:   11/13/17 1454  BP: (!) 190/88  Pulse: 64  Resp: 18  Temp: 98 F (36.7 C)  SpO2: 99%    Anti-infectives (From admission, onward)   Start     Dose/Rate Route Frequency Ordered Stop   11/13/17 1715  vancomycin (VANCOCIN) 1,750 mg in sodium chloride 0.9 % 500 mL IVPB     1,750 mg 250 mL/hr over 120 Minutes Intravenous  Once 11/13/17 1658        Antimicrobials this admission:  Vanc 9/30 >>    >>   Dose adjustments this admission:  n/a   Microbiology results:   BCx:   UCx:    Sputum:    MRSA PCR:    Plan: Initial dose(s) of Vancomycin X 1 ordered. F/U admission orders for further dosing if therapy continued.  Pricilla Larsson, [facility] 11/13/2017 6:35 PM

## 2017-11-13 NOTE — ED Provider Notes (Signed)
[facility] EMERGENCY DEPARTMENT Provider Note   CSN: [id number] Arrival date & time: 11/13/17  1432     History   Chief Complaint Chief Complaint  Patient presents with  . Fall    HPI Nicole [name] is a 67 y.o. female.  Status post accidental trip and fall approximately 1 week ago striking the left cheek.  No loss of consciousness.  No neck pain or extremity pain patient reports a large hematoma to the left side of the face and bruising of the left neck and upper chest.  Severity of symptoms is moderate to severe.  Palpation makes pain worse.     Past Medical History:  Diagnosis Date  . Anemia   . Arthritis   . Chronic alcoholism ([place])   . Chronic left shoulder pain   . Heartburn   . Hiatal hernia 12/17/2016   stomach noted in chest on CT scan  . Hypertension     Patient Active Problem List   Diagnosis Date Noted  . Acute respiratory failure with hypoxia ([place])   . Epigastric abdominal tenderness 12/18/2016  . Gastric mass 12/17/2016    Class: Acute  . Right renal mass 12/17/2016    Class: Acute  . Diverticulosis of sigmoid colon 12/17/2016  . Hiatal hernia 12/17/2016    Class: Acute  . Chronic alcoholism ([place]) 12/17/2016    Class: Chronic  . Chronic left shoulder pain 12/17/2016    Class: Chronic  . Arthritis 12/17/2016    Class: Chronic  . Hyponatremia 12/17/2016    Class: Acute  . Hypokalemia 12/17/2016    Class: Acute  . Anemia 12/17/2016  . Atrial fibrillation with rapid ventricular response ([place]) 12/17/2016    Class: Acute    Past Surgical History:  Procedure Laterality Date  . ANKLE FRACTURE SURGERY Right 2008   screws  . ANKLE FRACTURE SURGERY Left 2008   plates  . ESOPHAGOGASTRODUODENOSCOPY (EGD) WITH PROPOFOL N/A 12/18/2016   Procedure: ESOPHAGOGASTRODUODENOSCOPY (EGD) WITH PROPOFOL;  Surgeon: Wilford Corner, MD;  Location: [place];  Service: Endoscopy;  Laterality: N/A;     OB History   None      Home Medications     Prior to Admission medications   Medication Sig Start Date End Date Taking? Authorizing Provider  acetaminophen (TYLENOL) 650 MG suppository Place 1 suppository (650 mg total) every 4 (four) hours as needed rectally for fever. 12/27/16   Arnell Asal, NP  amiodarone (NEXTERONE PREMIX) 360-4.14 MG/200ML-% SOLN Inject 16.67 mL/hr continuous into the vein. 12/27/16   Arnell Asal, NP  Chlorhexidine Gluconate Cloth 2 % PADS Apply 6 each daily topically. 12/27/16   Arnell Asal, NP  chlorhexidine gluconate, MEDLINE KIT, (PERIDEX) 0.12 % solution 15 mLs 2 (two) times daily by Mouth Rinse route. 12/27/16   Arnell Asal, NP  clindamycin (CLEOCIN) 150 MG capsule Take 1 capsule (150 mg total) by mouth every 6 (six) hours. 11/13/17   Nat Christen, MD  dexmedetomidine (PRECEDEX) 400 MCG/100ML SOLN Inject 39.24-117.72 mcg/hr continuous into the vein. 12/27/16   Arnell Asal, NP  fentaNYL (SUBLIMAZE) SOLN Inject 25 mcg every hour as needed into the vein. 12/27/16   Jennelle Human B, NP  fentaNYL 10 mcg/ml SOLN infusion Inject 25-400 mcg/hr continuous into the vein. 12/27/16   Arnell Asal, NP  hydrALAZINE (APRESOLINE) 20 MG/ML injection Inject 0.5 mLs (10 mg total) every 4 (four) hours as needed into the vein (SBP > 170, DBP > 105). 12/27/16   Jennelle Human  B, NP  insulin aspart (NOVOLOG) 100 UNIT/ML injection Inject 0-20 Units every 4 (four) hours into the skin. 12/27/16   Arnell Asal, NP  insulin glargine (LANTUS) 100 UNIT/ML injection Inject 0.1 mLs (10 Units total) daily into the skin. 12/28/16   Arnell Asal, NP  levalbuterol (XOPENEX) 0.63 MG/3ML nebulizer solution Inhale 3 mLs (0.63 mg total) every 6 (six) hours into the lungs. 12/27/16   Arnell Asal, NP  methylPREDNISolone sodium succinate (SOLU-MEDROL) 40 mg/mL injection Inject 1 mL (40 mg total) daily into the vein. 12/28/16   Arnell Asal, NP  midazolam (VERSED) 2 MG/2ML SOLN injection Inject 1-4 mLs (1-4 mg  total) every 15 (fifteen) minutes as needed into the vein for agitation or sedation (to achieve RASS goal). 12/27/16   Arnell Asal, NP  mouth rinse LIQD solution 15 mLs QID by Mouth Rinse route. 12/28/16   Arnell Asal, NP  oxyCODONE-acetaminophen (PERCOCET) 5-325 MG tablet Take 1 tablet by mouth every 4 (four) hours as needed. 11/13/17   Nat Christen, MD  pantoprazole (PROTONIX) 40 MG injection Inject 40 mg every 12 (twelve) hours into the vein. 12/27/16   Arnell Asal, NP  piperacillin-tazobactam (ZOSYN) 3.375 GM/50ML IVPB Inject 50 mLs (3.375 g total) every 8 (eight) hours into the vein. 12/27/16   Arnell Asal, NP  Propylene Glycol (SYSTANE COMPLETE) 0.6 % SOLN Place 1 drop into both eyes See admin instructions. Instill one drop into both eyes every morning, may also use later in the day as needed for dry eyes    [provider]  vancomycin 1,250 mg in sodium chloride 0.9 % 250 mL Inject 1,250 mg daily into the vein. 12/28/16   Arnell Asal, NP    Family History Family History  Problem Relation Age of Onset  . Heart attack Father   . Ulcers Father   . Diabetes Sister   . Cancer Brother   . Diabetes Sister   . Diabetes Sister     Social History Social History   Tobacco Use  . Smoking status: Former Smoker    Types: Cigarettes    Last attempt to quit: 11/14/2007    Years since quitting: 10.0  . Smokeless tobacco: Never Used  Substance Use Topics  . Alcohol use: Yes    Comment: heavy  . Drug use: No     Allergies   Other; Shellfish allergy; and Iodinated diagnostic agents   Review of Systems Review of Systems  All other systems reviewed and are negative.    Physical Exam Updated Vital Signs BP (!) 187/97 (BP Location: Right Arm)   Pulse 77   Temp 98 F (36.7 C) (Oral)   Resp 18   Ht 5' 8"  (1.727 m)   Wt 86.6 kg   SpO2 100%   BMI 29.04 kg/m   Physical Exam  Constitutional: She is oriented to person, place, and time. She appears  well-developed and well-nourished.  HENT:  Head: Normocephalic.  Indurated area in the left cheek approximately 2.5 cm in diameter  Eyes:  Extraocular movements intact.  Blurred vision on the left.  Neck: Neck supple.  Cardiovascular: Normal rate and regular rhythm.  Pulmonary/Chest: Effort normal and breath sounds normal.  Abdominal: Soft. Bowel sounds are normal.  Musculoskeletal: Normal range of motion.  Neurological: She is alert and oriented to person, place, and time.  Skin:  Ecchymosis on left anterior lateral neck and upper chest wall  Psychiatric: She has a normal mood  and affect. Her behavior is normal.  Nursing note and vitals reviewed.    ED Treatments / Results  Labs (all labs ordered are listed, but only abnormal results are displayed) Labs Reviewed  CBC WITH DIFFERENTIAL/PLATELET - Abnormal; Notable for the following components:      Result Value   RBC 3.14 (*)    Hemoglobin 9.6 (*)    HCT 30.6 (*)    Lymphs Abs 0.5 (*)    All other components within normal limits  BASIC METABOLIC PANEL - Abnormal; Notable for the following components:   Sodium 134 (*)    Glucose, Bld 136 (*)    Creatinine, Ser 1.30 (*)    GFR calc non Af Amer 41 (*)    GFR calc Af Amer 48 (*)    All other components within normal limits    EKG None  Radiology Ct Maxillofacial Wo Cm  Result Date: 11/13/2017 CLINICAL DATA:  Fall 11/03/2017.  Left facial swelling EXAM: CT MAXILLOFACIAL WITHOUT CONTRAST TECHNIQUE: Multidetector CT imaging of the maxillofacial structures was performed. Multiplanar CT image reconstructions were also generated. COMPARISON:  None. FINDINGS: Osseous: Large fracture defect in the left orbital floor with multiple bone fragments in the left maxillary sinus as well as blood in the left maxillary sinus containing air-fluid level. No other acute fracture Metal plate and screws in the anterior mandibular symphysis. Poor dentition with numerous dental caries. Orbits: Large  fracture defect left orbital floor with multiple bone fragments in the left maxillary sinus. Inferior rectus muscle and orbital fat protrudes into the left maxillary sinus. No orbital hematoma or mass. Sinuses: Hyperdense fluid left maxillary sinus compatible with blood from fracture of the orbital floor. Remaining sinuses clear. Soft tissues: Large soft tissue mass left maxilla measuring 3.5 cm compatible with hematoma given the history. Limited intracranial: Negative IMPRESSION: Fracture left orbital floor. Large fracture defect with multiple bone fragments in the left maxillary sinus which also contains blood. Inferior rectus muscle and orbital fat protrudes into the left maxillary sinus. Large left maxillary soft tissue hematoma Poor dentition with numerous dental caries Electronically Signed   By: Franchot Gallo M.D.   On: 11/13/2017 18:04    Procedures Procedures (including critical care time)  Medications Ordered in ED Medications  sodium chloride 0.9 % bolus 500 mL (0 mLs Intravenous Stopped 11/13/17 1847)  vancomycin (VANCOCIN) 1,750 mg in sodium chloride 0.9 % 500 mL IVPB ( Intravenous Stopped 11/13/17 1938)     Initial Impression / Assessment and Plan / ED Course  I have reviewed the triage vital signs and the nursing notes.  Pertinent labs & imaging results that were available during my care of the patient were reviewed by me and considered in my medical decision making (see chart for details).     Patient reports fall 1 week ago with trauma to the left cheek.  White count normal.  CT scan reveals a left orbital floor fracture with bony fragments in the left maxillary sinus.  Inferior rectus muscle and orbital fat protrude into the sinus.  IV vancomycin in the emergency department.  These findings were discussed with the otolaryngologist on call.  Since incident happened 1 week ago, no immediate surgery was recommended.  Will Rx clindamycin and Percocet.  Patient will follow-up with ENT  Tuesday or Wednesday.  She understands that surgery is likely.  These findings were discussed with the patient and her ex-husband.   CRITICAL CARE Performed by: Nat Christen Total critical care time: 30 minutes  Critical care time was exclusive of separately billable procedures and treating other patients. Critical care was necessary to treat or prevent imminent or life-threatening deterioration. Critical care was time spent personally by me on the following activities: development of treatment plan with patient and/or surrogate as well as nursing, discussions with consultants, evaluation of patient's response to treatment, examination of patient, obtaining history from patient or surrogate, ordering and performing treatments and interventions, ordering and review of laboratory studies, ordering and review of radiographic studies, pulse oximetry and re-evaluation of patient's condition.  Final Clinical Impressions(s) / ED Diagnoses   Final diagnoses:  Closed fracture of left orbital floor, initial encounter [facility])    ED Discharge Orders         Ordered    oxyCODONE-acetaminophen (PERCOCET) 5-325 MG tablet  Every 4 hours PRN     11/13/17 2015    clindamycin (CLEOCIN) 150 MG capsule  Every 6 hours     11/13/17 2015           Nat Christen, MD 11/13/17 2124

## 2017-11-13 NOTE — Discharge Instructions (Addendum)
I discussed your case with the ENT doctor on call.  Call the office in the morning for follow-up on Tuesday Wednesday.  Prescription for antibiotic and pain medicine.  If symptoms worsen before your office visit, recommend going to [facility] emergency department

## 2017-11-13 NOTE — ED Triage Notes (Signed)
Pt states tripped and fell over one week ago hitting face. Pt denies LOC. Pt has large hematoma to left side of face, bruising to left arm, neck. Pt states she takes a lot of motrin (8-12 tablets) daily for arthritis.

## 2017-11-13 NOTE — ED Notes (Signed)
Friday night the patient states that she went to bed with an ice pack wrapped with a wash cloth around her face because it was painful and she states that when she woke up the place on her face was draining green fluid.

## 2017-11-15 ENCOUNTER — Other Ambulatory Visit: Payer: Self-pay | Admitting: Otolaryngology

## 2017-11-15 DIAGNOSIS — J343 Hypertrophy of nasal turbinates: Secondary | ICD-10-CM | POA: Diagnosis not present

## 2017-11-15 DIAGNOSIS — Z87891 Personal history of nicotine dependence: Secondary | ICD-10-CM | POA: Diagnosis not present

## 2017-11-15 DIAGNOSIS — S0230XA Fracture of orbital floor, unspecified side, initial encounter for closed fracture: Secondary | ICD-10-CM | POA: Insufficient documentation

## 2017-11-15 DIAGNOSIS — S0083XA Contusion of other part of head, initial encounter: Secondary | ICD-10-CM | POA: Diagnosis not present

## 2017-11-15 DIAGNOSIS — S0232XA Fracture of orbital floor, left side, initial encounter for closed fracture: Secondary | ICD-10-CM | POA: Diagnosis not present

## 2017-11-15 DIAGNOSIS — W228XXA Striking against or struck by other objects, initial encounter: Secondary | ICD-10-CM | POA: Diagnosis not present

## 2017-11-15 DIAGNOSIS — Z7289 Other problems related to lifestyle: Secondary | ICD-10-CM | POA: Diagnosis not present

## 2017-11-16 ENCOUNTER — Other Ambulatory Visit: Payer: Self-pay

## 2017-11-16 ENCOUNTER — Encounter (HOSPITAL_COMMUNITY): Payer: Self-pay | Admitting: *Deleted

## 2017-11-16 NOTE — Progress Notes (Signed)
Anesthesia Chart Review: SAME DAY WORK-UP  Case:  [id number] Date/Time:  11/17/17 1145   Procedures:      Trans Antral Left sided orbital floor fracture repair with dissovable floor implant (Left )     ENDOSCOPIC SINUS approach (Left )   Anesthesia type:  General   Pre-op diagnosis:  Orbital floor fracture, left side   Location:  [place] OR ROOM 09 / [place] OR   Surgeon:  Melida Quitter, MD      DISCUSSION: Patient is a 67 year old female scheduled for the above procedure. She was just evaluated by Dr. Redmond Baseman on 11/15/17. She had fallen almost 2 weeks ago, striking her left face against the bed frame. She had bleeding in her mouth and left cheek swelling. By notes she also had double vision without change in her visual acuity. She did not go to the ED until 11/13/17 and imaging revealed a left orbital floor fracture. She was started on Clindamycin and referred to ENT. Dr. Redmond Baseman placed a [place]rose drain for the left cheek hematoma which was causing overlying skin changes and recommended repair of large left orbital floor fracture.   Other history includes HTN, PAF 12/2016 (in setting of acute illness), ETOH abuse, anemia, hiatal hernia, renal cancer (s/p right nephrectomy 10/04/17, left thyroid nodule (surgery recommended 02/2017) - Hospitalized at [facility] 10/04/17-10/05/17, s/p robotic converted to open right radical nephrectomy 10/04/17. - Hospitalized at [facility] (transfer from [facility]) 12/17/16-01/05/17 for further management of gastric outlet obstruction. GI and surgical oncology consulted. Underwent EUS and FNA 01/02/18 (documented as benign cytology), and not felt to be a candidate for surgical intervention at that time. Extubated 12/28/16. Code stroke called 12/29/16 for symptoms of expressive aphasia and confusion in the setting of oxygen desaturation, but thought to be encephalopathy due to hypoxic event. For afib, Amiodarone weaned and transitioned to [employer]. Discharged with PCP follow-up for PAF, urology for renal  mass, surgical oncology for pancreatic cyst and thyroid mass. - Hospitalized at [facility] 12/16/16-12/17/16 with abdominal pain and SOB. CT showed large hiatal hernia and possible gastric mass, pancreatic cyst, cirrhosis, and right renal mass. GI Dr. Michail Sermon was unable to perform a complete EGD because he could not pass the scope beyond the gastric mass. She was scheduled for surgery, but on 12/23/16, she developed worsening respiratory distress that required intubation. Acute hypoxic respiratory failure felt secondary to aspiration pneumonia. She developed afib with RVR requiring amiodarone drip. Echo showed normal LVEF and wall motion. She was stablized in the ICU, but given concerns for complexity of her surgical needs a decision was made to transfer her to [facility].   Pharmacy and PAT phone RN still attempting to reach patient to update medication list and history. She was back in SR in 09/2017 and tolerated recent nephrectomy. Will defer to anesthesiologist/surgeon if T&S needed. If EKG not received from [facility] then she will need one done on the day of surgery.   PROVIDERS: Rosita Fire, MD is PCP  - Morrison Old, MD is Urologist [facility], although he is apparently moving to [place] in [place]  Jyl Heinz, MD is surgical oncologist ([place]). No further follow-up of pancreatic pseudocyst recommended 01/31/17 unless new symptoms arise. Also seen by Fredirick Maudlin, MD for thyroid nodule for FNA left thyroid nodule. By 03/14/17 notes, "HRAS mutation detected, ~70% risk of cancer/NIFTP. Thyroid lobectomy recommended. Patient would like to wait until June and will get back to me with possible dates."     LABS: Cr 11/13/17  was 1.30 (previously 1.26 on 10/05/17 at [facility]). H/H 9.6/30.6 (previously 10.2/29.9 822/19 at [facility] but with recent facial hematoma) Most recent labs in Epic include: Lab Results  Component Value Date   WBC 6.8 11/13/2017   HGB 9.6 (L) 11/13/2017   HCT 30.6 (L)  11/13/2017   PLT 312 11/13/2017   GLUCOSE 136 (H) 11/13/2017   ALT 32 12/26/2016   AST 35 12/26/2016   NA 134 (L) 11/13/2017   K 3.9 11/13/2017   CL 101 11/13/2017   CREATININE 1.30 (H) 11/13/2017   BUN 20 11/13/2017   CO2 23 11/13/2017   HGBA1C 6.2 (H) 12/23/2016    IMAGES: CT Maxillofacial 11/13/17: IMPRESSION: - Fracture left orbital floor. Large fracture defect with multiple bone fragments in the left maxillary sinus which also contains blood. Inferior rectus muscle and orbital fat protrudes into the left maxillary sinus. Large left maxillary soft tissue hematoma - Poor dentition with numerous dental caries  CT Chest 12/24/16 (in setting of acute respiratory failure/aspiration PNA): CHEST CT 1. Complete atelectasis of the left lower lobe associated with a small pleural effusion. 2. Mild dependent atelectasis in the right lower lobe and left upper lobe. 3. No convincing pneumonia.  No pulmonary edema. 4. Mild cardiomegaly.   EKG:  - EKG 09/27/17 [facility]): Tracing requested. Sinus rhythm Right axis deviation Otherwise within normal limits When compared with ECG of 30-Dec-2016 11:52, Sinus rhythm has replaced Atrial flutter QRS axis Shifted right Nonspecific T wave abnormality no longer evident in inferior leads T wave inversion no longer evident in anterior leads  - EKG 12/31/16 [facility]): Afib/flutter at 107 bpm. Non-specific ST and T wave abnormality.  - EKG 12/26/16: SB at 54 bpm   CV: Echo 12/30/16 ([place]): SUMMARY The left ventricular size is normal. There is normal left ventricular wall thickness. Left ventricular systolic function is normal. LV ejection fraction = 55-60%. The left ventricular wall motion is normal. The right ventricle is normal in size and function. There is no significant valvular stenosis or regurgitation Mild pulmonary hypertension. There is no pericardial effusion. Injection of agitated saline showed no right-to-left  shunt. There is no comparison study available.  Echo 12/23/16: Study Conclusions - Left ventricle: The cavity size was normal. Wall thickness was   normal. Systolic function was normal. The estimated ejection   fraction was in the range of 55% to 60%. Wall motion was normal;   there were no regional wall motion abnormalities.   Past Medical History:  Diagnosis Date  . Anemia   . Arthritis   . Atrial fibrillation with RVR ([place])   . Chronic alcoholism ([place])   . Chronic left shoulder pain   . Heartburn   . Hiatal hernia 12/17/2016   stomach noted in chest on CT scan  . Hypertension     Past Surgical History:  Procedure Laterality Date  . ANKLE FRACTURE SURGERY Right 2008   screws  . ANKLE FRACTURE SURGERY Left 2008   plates  . ESOPHAGOGASTRODUODENOSCOPY (EGD) WITH PROPOFOL N/A 12/18/2016   Procedure: ESOPHAGOGASTRODUODENOSCOPY (EGD) WITH PROPOFOL;  Surgeon: Wilford Corner, MD;  Location: [place];  Service: Endoscopy;  Laterality: N/A;    MEDICATIONS: No current facility-administered medications for this encounter.    Marland Kitchen acetaminophen (TYLENOL) 650 MG suppository  . amiodarone (NEXTERONE PREMIX) 360-4.14 MG/200ML-% SOLN  . Chlorhexidine Gluconate Cloth 2 % PADS  . chlorhexidine gluconate, MEDLINE KIT, (PERIDEX) 0.12 % solution  . clindamycin (CLEOCIN) 150 MG capsule  . dexmedetomidine (PRECEDEX) 400 MCG/100ML  SOLN  . fentaNYL (SUBLIMAZE) SOLN  . fentaNYL 10 mcg/ml SOLN infusion  . hydrALAZINE (APRESOLINE) 20 MG/ML injection  . insulin aspart (NOVOLOG) 100 UNIT/ML injection  . insulin glargine (LANTUS) 100 UNIT/ML injection  . levalbuterol (XOPENEX) 0.63 MG/3ML nebulizer solution  . methylPREDNISolone sodium succinate (SOLU-MEDROL) 40 mg/mL injection  . midazolam (VERSED) 2 MG/2ML SOLN injection  . mouth rinse LIQD solution  . oxyCODONE-acetaminophen (PERCOCET) 5-325 MG tablet  . pantoprazole (PROTONIX) 40 MG injection  . piperacillin-tazobactam (ZOSYN) 3.375  GM/50ML IVPB  . Propylene Glycol (SYSTANE COMPLETE) 0.6 % SOLN  . vancomycin 1,250 mg in sodium chloride 0.9 % 250 mL    George Hugh [facility] Short Stay Center/Anesthesiology Phone [phone] 11/16/2017 12:41 PM

## 2017-11-17 ENCOUNTER — Ambulatory Visit (HOSPITAL_COMMUNITY): Payer: Medicare HMO | Admitting: Vascular Surgery

## 2017-11-17 ENCOUNTER — Encounter (HOSPITAL_COMMUNITY)
Admission: RE | Disposition: A | Discharge status: Home / Self Care | Payer: Self-pay | Source: Ambulatory Visit | Attending: Otolaryngology

## 2017-11-17 ENCOUNTER — Ambulatory Visit (HOSPITAL_COMMUNITY)
Admission: RE | Admit: 2017-11-17 | Discharge: 2017-11-17 | Disposition: A | Discharge status: Home / Self Care | Payer: Medicare HMO | Source: Ambulatory Visit | Attending: Otolaryngology | Admitting: Otolaryngology

## 2017-11-17 ENCOUNTER — Encounter (HOSPITAL_COMMUNITY): Payer: Self-pay | Admitting: *Deleted

## 2017-11-17 DIAGNOSIS — M1990 Unspecified osteoarthritis, unspecified site: Secondary | ICD-10-CM | POA: Insufficient documentation

## 2017-11-17 DIAGNOSIS — Z85528 Personal history of other malignant neoplasm of kidney: Secondary | ICD-10-CM | POA: Insufficient documentation

## 2017-11-17 DIAGNOSIS — Z91013 Allergy to seafood: Secondary | ICD-10-CM | POA: Diagnosis not present

## 2017-11-17 DIAGNOSIS — Z87891 Personal history of nicotine dependence: Secondary | ICD-10-CM | POA: Diagnosis not present

## 2017-11-17 DIAGNOSIS — I4891 Unspecified atrial fibrillation: Secondary | ICD-10-CM | POA: Diagnosis not present

## 2017-11-17 DIAGNOSIS — F209 Schizophrenia, unspecified: Secondary | ICD-10-CM | POA: Diagnosis not present

## 2017-11-17 DIAGNOSIS — S0232XA Fracture of orbital floor, left side, initial encounter for closed fracture: Secondary | ICD-10-CM | POA: Insufficient documentation

## 2017-11-17 DIAGNOSIS — Z79899 Other long term (current) drug therapy: Secondary | ICD-10-CM | POA: Insufficient documentation

## 2017-11-17 DIAGNOSIS — J45909 Unspecified asthma, uncomplicated: Secondary | ICD-10-CM | POA: Diagnosis not present

## 2017-11-17 DIAGNOSIS — Y92009 Unspecified place in unspecified non-institutional (private) residence as the place of occurrence of the external cause: Secondary | ICD-10-CM | POA: Insufficient documentation

## 2017-11-17 DIAGNOSIS — W19XXXA Unspecified fall, initial encounter: Secondary | ICD-10-CM | POA: Diagnosis not present

## 2017-11-17 DIAGNOSIS — S0083XA Contusion of other part of head, initial encounter: Secondary | ICD-10-CM | POA: Diagnosis not present

## 2017-11-17 DIAGNOSIS — Z91041 Radiographic dye allergy status: Secondary | ICD-10-CM | POA: Insufficient documentation

## 2017-11-17 DIAGNOSIS — D649 Anemia, unspecified: Secondary | ICD-10-CM | POA: Diagnosis not present

## 2017-11-17 DIAGNOSIS — K219 Gastro-esophageal reflux disease without esophagitis: Secondary | ICD-10-CM | POA: Insufficient documentation

## 2017-11-17 DIAGNOSIS — Z8249 Family history of ischemic heart disease and other diseases of the circulatory system: Secondary | ICD-10-CM | POA: Insufficient documentation

## 2017-11-17 DIAGNOSIS — I10 Essential (primary) hypertension: Secondary | ICD-10-CM | POA: Insufficient documentation

## 2017-11-17 HISTORY — DX: Personal history of other medical treatment: Z92.89

## 2017-11-17 HISTORY — PX: ORIF ORBITAL FRACTURE: SHX5312

## 2017-11-17 HISTORY — PX: NASAL SINUS SURGERY: SHX719

## 2017-11-17 HISTORY — DX: Cardiac arrhythmia, unspecified: I49.9

## 2017-11-17 HISTORY — DX: Unspecified atrial fibrillation: I48.91

## 2017-11-17 HISTORY — DX: Other amnesia: R41.3

## 2017-11-17 HISTORY — DX: Nontoxic single thyroid nodule: E04.1

## 2017-11-17 HISTORY — DX: Unspecified asthma, uncomplicated: J45.909

## 2017-11-17 HISTORY — DX: Schizophrenia, unspecified: F20.9

## 2017-11-17 HISTORY — DX: Malignant (primary) neoplasm, unspecified: C80.1

## 2017-11-17 HISTORY — DX: Gastro-esophageal reflux disease without esophagitis: K21.9

## 2017-11-17 HISTORY — DX: Unspecified convulsions: R56.9

## 2017-11-17 SURGERY — OPEN REDUCTION INTERNAL FIXATION (ORIF) ORBITAL FRACTURE
Anesthesia: General | Site: Face | Laterality: Left

## 2017-11-17 MED ORDER — EPHEDRINE 5 MG/ML INJ
INTRAVENOUS | Status: AC
  Filled 2017-11-17: qty 10

## 2017-11-17 MED ORDER — MIDAZOLAM HCL 2 MG/2ML IJ SOLN
INTRAMUSCULAR | Status: AC
  Filled 2017-11-17: qty 2

## 2017-11-17 MED ORDER — BACITRACIN ZINC 500 UNIT/GM EX OINT
TOPICAL_OINTMENT | CUTANEOUS | Status: AC
  Filled 2017-11-17: qty 28.35

## 2017-11-17 MED ORDER — ONDANSETRON HCL 4 MG/2ML IJ SOLN
INTRAMUSCULAR | Status: DC | PRN
  Administered 2017-11-17: 4 mg via INTRAVENOUS

## 2017-11-17 MED ORDER — ONDANSETRON HCL 4 MG/2ML IJ SOLN
INTRAMUSCULAR | Status: AC
  Filled 2017-11-17: qty 2

## 2017-11-17 MED ORDER — ARTIFICIAL TEARS OPHTHALMIC OINT
TOPICAL_OINTMENT | OPHTHALMIC | Status: DC | PRN
  Administered 2017-11-17: 1 via OPHTHALMIC

## 2017-11-17 MED ORDER — OXYMETAZOLINE HCL 0.05 % NA SOLN
NASAL | Status: AC
  Filled 2017-11-17: qty 15

## 2017-11-17 MED ORDER — ROCURONIUM BROMIDE 50 MG/5ML IV SOSY
PREFILLED_SYRINGE | INTRAVENOUS | Status: DC | PRN
  Administered 2017-11-17: 50 mg via INTRAVENOUS

## 2017-11-17 MED ORDER — DEXMEDETOMIDINE HCL IN NACL 200 MCG/50ML IV SOLN
INTRAVENOUS | Status: DC | PRN
  Administered 2017-11-17: 4 ug via INTRAVENOUS
  Administered 2017-11-17 (×2): 8 ug via INTRAVENOUS

## 2017-11-17 MED ORDER — PROPOFOL 10 MG/ML IV BOLUS
INTRAVENOUS | Status: DC | PRN
  Administered 2017-11-17: 140 mg via INTRAVENOUS

## 2017-11-17 MED ORDER — PHENYLEPHRINE 40 MCG/ML (10ML) SYRINGE FOR IV PUSH (FOR BLOOD PRESSURE SUPPORT)
PREFILLED_SYRINGE | INTRAVENOUS | Status: DC | PRN
  Administered 2017-11-17: 80 ug via INTRAVENOUS

## 2017-11-17 MED ORDER — LIDOCAINE 2% (20 MG/ML) 5 ML SYRINGE
INTRAMUSCULAR | Status: DC | PRN
  Administered 2017-11-17: 60 mg via INTRAVENOUS

## 2017-11-17 MED ORDER — OXYCODONE HCL 5 MG PO TABS: 5.0000 mg | ORAL_TABLET | Freq: Once | ORAL | Status: DC | PRN

## 2017-11-17 MED ORDER — LACTATED RINGERS IV SOLN: INTRAVENOUS | Status: DC

## 2017-11-17 MED ORDER — DEXAMETHASONE SODIUM PHOSPHATE 10 MG/ML IJ SOLN
INTRAMUSCULAR | Status: AC
  Filled 2017-11-17: qty 1

## 2017-11-17 MED ORDER — LACTATED RINGERS IV SOLN
INTRAVENOUS | Status: DC
  Administered 2017-11-17: 11:00:00 via INTRAVENOUS

## 2017-11-17 MED ORDER — GLYCOPYRROLATE PF 0.2 MG/ML IJ SOSY
PREFILLED_SYRINGE | INTRAMUSCULAR | Status: DC | PRN
  Administered 2017-11-17: .2 mg via INTRAVENOUS

## 2017-11-17 MED ORDER — MEPERIDINE HCL 50 MG/ML IJ SOLN: 6.2500 mg | INTRAMUSCULAR | Status: DC | PRN

## 2017-11-17 MED ORDER — LIDOCAINE-EPINEPHRINE 1 %-1:100000 IJ SOLN
INTRAMUSCULAR | Status: DC | PRN
  Administered 2017-11-17: 3 mL

## 2017-11-17 MED ORDER — CEFAZOLIN SODIUM 1 G IJ SOLR
INTRAMUSCULAR | Status: AC
  Filled 2017-11-17: qty 20

## 2017-11-17 MED ORDER — PHENYLEPHRINE 40 MCG/ML (10ML) SYRINGE FOR IV PUSH (FOR BLOOD PRESSURE SUPPORT)
PREFILLED_SYRINGE | INTRAVENOUS | Status: AC
  Filled 2017-11-17: qty 10

## 2017-11-17 MED ORDER — PROMETHAZINE HCL 25 MG/ML IJ SOLN: 6.2500 mg | INTRAMUSCULAR | Status: DC | PRN

## 2017-11-17 MED ORDER — ROCURONIUM BROMIDE 50 MG/5ML IV SOSY
PREFILLED_SYRINGE | INTRAVENOUS | Status: AC
  Filled 2017-11-17: qty 5

## 2017-11-17 MED ORDER — LIDOCAINE 2% (20 MG/ML) 5 ML SYRINGE
INTRAMUSCULAR | Status: AC
  Filled 2017-11-17: qty 5

## 2017-11-17 MED ORDER — OXYMETAZOLINE HCL 0.05 % NA SOLN
NASAL | Status: DC | PRN
  Administered 2017-11-17: 1 via TOPICAL

## 2017-11-17 MED ORDER — SUGAMMADEX SODIUM 200 MG/2ML IV SOLN
INTRAVENOUS | Status: DC | PRN
  Administered 2017-11-17: 200 mg via INTRAVENOUS

## 2017-11-17 MED ORDER — MIDAZOLAM HCL 5 MG/5ML IJ SOLN
INTRAMUSCULAR | Status: DC | PRN
  Administered 2017-11-17: 2 mg via INTRAVENOUS

## 2017-11-17 MED ORDER — DEXAMETHASONE SODIUM PHOSPHATE 10 MG/ML IJ SOLN
INTRAMUSCULAR | Status: DC | PRN
  Administered 2017-11-17: 10 mg via INTRAVENOUS

## 2017-11-17 MED ORDER — FENTANYL CITRATE (PF) 100 MCG/2ML IJ SOLN
INTRAMUSCULAR | Status: DC | PRN
  Administered 2017-11-17 (×2): 50 ug via INTRAVENOUS
  Administered 2017-11-17: 150 ug via INTRAVENOUS

## 2017-11-17 MED ORDER — HYDROMORPHONE HCL 1 MG/ML IJ SOLN: 0.2500 mg | INTRAMUSCULAR | Status: DC | PRN

## 2017-11-17 MED ORDER — FENTANYL CITRATE (PF) 100 MCG/2ML IJ SOLN
INTRAMUSCULAR | Status: AC
  Administered 2017-11-17: 25 ug
  Administered 2017-11-17: 50 ug
  Filled 2017-11-17: qty 2

## 2017-11-17 MED ORDER — PROPOFOL 1000 MG/100ML IV EMUL
INTRAVENOUS | Status: AC
  Filled 2017-11-17: qty 100

## 2017-11-17 MED ORDER — ROCURONIUM BROMIDE 50 MG/5ML IV SOSY
PREFILLED_SYRINGE | INTRAVENOUS | Status: AC
  Filled 2017-11-17: qty 10

## 2017-11-17 MED ORDER — CEFAZOLIN SODIUM-DEXTROSE 2-3 GM-%(50ML) IV SOLR
INTRAVENOUS | Status: DC | PRN
  Administered 2017-11-17: 2 g via INTRAVENOUS

## 2017-11-17 MED ORDER — HYDROCODONE-ACETAMINOPHEN 5-325 MG PO TABS: 1.0000 | ORAL_TABLET | Freq: Four times a day (QID) | ORAL | 0 refills | Status: AC | PRN

## 2017-11-17 MED ORDER — 0.9 % SODIUM CHLORIDE (POUR BTL) OPTIME
TOPICAL | Status: DC | PRN
  Administered 2017-11-17: 1000 mL

## 2017-11-17 MED ORDER — MUPIROCIN 2 % EX OINT
TOPICAL_OINTMENT | CUTANEOUS | Status: AC
  Filled 2017-11-17: qty 22

## 2017-11-17 MED ORDER — OXYCODONE HCL 5 MG/5ML PO SOLN: 5.0000 mg | Freq: Once | ORAL | Status: DC | PRN

## 2017-11-17 MED ORDER — EPHEDRINE SULFATE-NACL 50-0.9 MG/10ML-% IV SOSY
PREFILLED_SYRINGE | INTRAVENOUS | Status: DC | PRN
  Administered 2017-11-17 (×2): 10 mg via INTRAVENOUS

## 2017-11-17 MED ORDER — FENTANYL CITRATE (PF) 250 MCG/5ML IJ SOLN
INTRAMUSCULAR | Status: AC
  Filled 2017-11-17: qty 5

## 2017-11-17 SURGICAL SUPPLY — 44 items
CANISTER SUCT 3000ML PPV (MISCELLANEOUS) ×2 IMPLANT
CLEANER TIP ELECTROSURG 2X2 (MISCELLANEOUS) ×2 IMPLANT
COAGULATOR SUCT SWTCH 10FR 6 (ELECTROSURGICAL) IMPLANT
COVER SURGICAL LIGHT HANDLE (MISCELLANEOUS) ×2 IMPLANT
CRADLE DONUT ADULT HEAD (MISCELLANEOUS) ×2 IMPLANT
DECANTER SPIKE VIAL GLASS SM (MISCELLANEOUS) ×2 IMPLANT
ELECT COATED BLADE 2.86 ST (ELECTRODE) ×2 IMPLANT
ELECT REM PT RETURN 9FT ADLT (ELECTROSURGICAL) ×2
ELECTRODE REM PT RTRN 9FT ADLT (ELECTROSURGICAL) ×1 IMPLANT
GLOVE BIO SURGEON STRL SZ7.5 (GLOVE) ×2 IMPLANT
GOWN STRL REUS W/ TWL LRG LVL3 (GOWN DISPOSABLE) ×2 IMPLANT
GOWN STRL REUS W/TWL LRG LVL3 (GOWN DISPOSABLE) ×2
KIT BASIN OR (CUSTOM PROCEDURE TRAY) ×2 IMPLANT
KIT TURNOVER KIT B (KITS) ×2 IMPLANT
NEEDLE HYPO 25GX1X1/2 BEV (NEEDLE) IMPLANT
NEEDLE PRECISIONGLIDE 27X1.5 (NEEDLE) ×2 IMPLANT
NEEDLE SPNL 22GX3.5 QUINCKE BK (NEEDLE) ×2 IMPLANT
NEEDLE SPNL 25GX3.5 QUINCKE BL (NEEDLE) IMPLANT
NS IRRIG 1000ML POUR BTL (IV SOLUTION) ×2 IMPLANT
PAD ARMBOARD 7.5X6 YLW CONV (MISCELLANEOUS) ×2 IMPLANT
PATTIES SURGICAL .5 X3 (DISPOSABLE) ×2 IMPLANT
PENCIL BUTTON HOLSTER BLD 10FT (ELECTRODE) ×2 IMPLANT
PLATE TRI DELTA 1.7X30 (Plate) ×2 IMPLANT
SCISSORS WIRE ANG 4 3/4 DISP (INSTRUMENTS) ×2 IMPLANT
SHEATH ENDOSCRUB 0 DEG (SHEATH) ×2 IMPLANT
SHEATH ENDOSCRUB 30 DEG (SHEATH) ×2 IMPLANT
SHEATH ENDOSCRUB 45 DEG (SHEATH) IMPLANT
SOLUTION ANTI FOG 6CC (MISCELLANEOUS) ×2 IMPLANT
SUT ETHILON 3 0 PS 1 (SUTURE) IMPLANT
SUT ETHILON 4 0 CL P 3 (SUTURE) IMPLANT
SUT MON AB 3-0 SH 27 (SUTURE) ×1
SUT MON AB 3-0 SH27 (SUTURE) ×1 IMPLANT
SUT PROLENE 6 0 PC 1 (SUTURE) IMPLANT
SUT STEEL 0 (SUTURE)
SUT STEEL 0 18XMFL TIE 17 (SUTURE) IMPLANT
SUT STEEL 2 (SUTURE) IMPLANT
SUT VICRYL 4-0 PS2 18IN ABS (SUTURE) IMPLANT
SWAB COLLECTION DEVICE MRSA (MISCELLANEOUS) IMPLANT
SYR 50ML SLIP (SYRINGE) IMPLANT
TOWEL OR 17X24 6PK STRL BLUE (TOWEL DISPOSABLE) ×2 IMPLANT
TRAY ENT MC OR (CUSTOM PROCEDURE TRAY) ×2 IMPLANT
TRAY FOLEY MTR SLVR 14FR STAT (SET/KITS/TRAYS/PACK) IMPLANT
TUBE CONNECTING 12X1/4 (SUCTIONS) ×2 IMPLANT
WATER STERILE IRR 1000ML POUR (IV SOLUTION) ×2 IMPLANT

## 2017-11-17 NOTE — Brief Op Note (Signed)
11/17/2017  2:39 PM  PATIENT:  Nicole [name]  67 y.o. female  PRE-OPERATIVE DIAGNOSIS:  Orbital floor fracture, left side  POST-OPERATIVE DIAGNOSIS:  Orbital floor fracture, left side  PROCEDURE:  Procedure(s): Trans Antral Left sided orbital floor fracture repair with dissovable floor implant (Left) ENDOSCOPIC SINUS approach (Left)  SURGEON:  Surgeon(s) and Role:    Melida Quitter, MD - Primary  PHYSICIAN ASSISTANT:   ASSISTANTS: none   ANESTHESIA:   general  EBL:  40 mL   BLOOD ADMINISTERED:none  DRAINS: none   LOCAL MEDICATIONS USED:  LIDOCAINE   SPECIMEN:  No Specimen  DISPOSITION OF SPECIMEN:  N/A  COUNTS:  YES  TOURNIQUET:  * No tourniquets in log *  DICTATION: .Other Dictation: Dictation Number [id number]  PLAN OF CARE: Discharge to home after PACU  PATIENT DISPOSITION:  PACU - hemodynamically stable.   Delay start of Pharmacological VTE agent (>24hrs) due to surgical blood loss or risk of bleeding: no

## 2017-11-17 NOTE — H&P (Signed)
Nicole [name] is an 67 y.o. female.   Chief Complaint: Orbital floor fracture HPI: 68 year old female sustained left orbital floor fracture and cheek hematoma when she fell almost two weeks ago.  She was delayed in seeking care but was found to have a left orbital floor fracture by imaging.  The left cheek hematoma was drained intraorally a couple of days ago.  She presents for surgical management.  Past Medical History:  Diagnosis Date  . Anemia   . Arthritis   . Asthma    as a child  . Atrial fibrillation with RVR ([place])   . Cancer ([place])    right kidney  . Chronic alcoholism ([place])   . Chronic left shoulder pain   . Dysrhythmia    Atrial Fib with RVR   . GERD (gastroesophageal reflux disease)   . Heartburn   . Hiatal hernia 12/17/2016   stomach noted in chest on CT scan  . History of blood transfusion   . Hypertension   . Memory loss   . Schizophrenia ([place])   . Seizures ([place])     had when she had period and when she ovalate  . Thyroid nodule     Past Surgical History:  Procedure Laterality Date  . ANKLE FRACTURE SURGERY Right 2008   screws  . ANKLE FRACTURE SURGERY Left 2008   plates  . ESOPHAGOGASTRODUODENOSCOPY (EGD) WITH PROPOFOL N/A 12/18/2016   Procedure: ESOPHAGOGASTRODUODENOSCOPY (EGD) WITH PROPOFOL;  Surgeon: Wilford Corner, MD;  Location: [facility];  Service: Endoscopy;  Laterality: N/A;  . NEPHRECTOMY Right    robotic riight radical     Family History  Problem Relation Age of Onset  . Stroke Mother   . Heart attack Father   . Ulcers Father   . Diabetes Sister   . Cancer Brother   . Diabetes Sister   . Diabetes Sister    Social History:  reports that she quit smoking about 10 years ago. Her smoking use included cigarettes. She quit after 15.00 years of use. She has never used smokeless tobacco. She reports that she drank alcohol. She reports that she does not use drugs.  Allergies:  Allergies  Allergen Reactions  . Other Shortness Of Breath and  Anxiety    UNSPECIFIED REACTION to "fast foods and processed food"  . Shellfish Allergy Shortness Of Breath  . Iodinated Diagnostic Agents Swelling    Hand swelling from contrast dye for MRI    Medications Prior to Admission  Medication Sig Dispense Refill  . Calcium Carbonate-Vitamin D (CALCIUM 500 + D PO) Take 1 tablet by mouth daily.    . clindamycin (CLEOCIN) 150 MG capsule Take 1 capsule (150 mg total) by mouth every 6 (six) hours. 28 capsule 0  . ferrous sulfate 325 (65 FE) MG tablet Take 325 mg by mouth 2 (two) times daily with a meal.    . Lido-Capsaicin-Men-Methyl Sal (MEDI-PATCH-LIDOCAINE EX) Apply topically.    . Magnesium 250 MG TABS Take 2 tablets by mouth daily.    . metoprolol tartrate (LOPRESSOR) 50 MG tablet Take 50 mg by mouth 2 (two) times daily.    . Multiple Vitamins-Minerals (MULTIVITAMIN WITH MINERALS) tablet Take 1 tablet by mouth daily.    . potassium gluconate (HM POTASSIUM) 595 (99 K) MG TABS tablet Take 595 mg by mouth daily.    Marland Kitchen Propylene Glycol (SYSTANE COMPLETE) 0.6 % SOLN Place 1 drop into both eyes See admin instructions. Instill one drop into both eyes every morning, may  also use later in the day as needed for dry eyes      No results found for this or any previous visit (from the past 48 hour(s)). No results found.  Review of Systems  Eyes: Positive for double vision.  Musculoskeletal: Positive for joint pain.  All other systems reviewed and are negative.   Blood pressure (!) 164/84, pulse (!) 56, temperature 98.2 F (36.8 C), temperature source Oral, resp. rate 18, height 5\' 8"  (1.727 m), weight 86.6 kg, SpO2 100 %. Physical Exam   Assessment/Plan Left orbital floor fracture To OR for left transantral orbital floor repair with implant.  Melida Quitter, MD 11/17/2017, 12:30 PM

## 2017-11-17 NOTE — Op Note (Signed)
NAMEDODY, SMARTT MEDICAL RECORD [id number] ACCOUNT [id number] DATE OF BIRTH:[birth year] FACILITY: [facility] LOCATION: [facility] PHYSICIAN:Tracina Beaumont D. Charlie Char, MD  OPERATIVE REPORT  DATE OF PROCEDURE:  11/17/2017  PREOPERATIVE DIAGNOSIS:  Left orbital floor blowout fracture.  POSTOPERATIVE DIAGNOSIS:  Left orbital floor blowout fracture.  PROCEDURE:  Transantral repair of left orbital floor fracture with dissolvable implant.  SURGEON:  Melida Quitter, MD  ANESTHESIA:  General endotracheal anesthesia.  COMPLICATIONS:  None.  INDICATIONS:  The patient is a 67 year old female who fell in her house almost 2 weeks ago and had delayed evaluation in the emergency room demonstrating an isolated left orbital floor blowout fracture with orbital contents in the maxillary sinus.  She  had a sizable hematoma in her left cheek as well.  The hematoma seemed to be getting infected, so an incision and drainage was performed in the office and a Penrose drain left in place.  She presents to the operating room for management of her orbital  fracture.  FINDINGS:  The hematoma cavity remains fairly prominent and was suctioned again through the intraoral incision.  The Penrose drain was left in place.  Upon entering the maxillary sinus, there was a large volume of orbital contents in the maxillary sinus  with a couple of bone fragments present.  These were reduced into the orbit and a dissolvable implant placed.  DESCRIPTION OF PROCEDURE:  The patient was identified in the preoperative area and informed consent having been obtained, she was brought to the operative suite and put the operative table in supine position.  Anesthesia was induced and the patient was  intubated by the anesthesia team without difficulty.  The patient was given intravenous antibiotics during the case.  The eyes were lubricated and the face was prepped and draped in sterile fashion.  The right eye was taped closed with Steri-Strips.    Through the mouth, the cheek hematoma was explored through the intraoral incision using suction tips.  Some of the clot was able to be suctioned out through the intraoral incision.  The Penrose drain was left in place.  Superior left gingival buccal  sulcus was injected with 1% lidocaine with 1:100,000 epinephrine.  Incision was made through the mucosal tissue using Bovie electrocautery and extended through the submucosal tissues down to the maxilla.  Soft tissues were elevated off the maxilla using  an elevator keeping the infraorbital nerve intact and in view.  An osteotome was then used to enter the maxillary sinus and a fairly sizeable antrostomy was created using Kerrison rongeur.  The maxillary sinus was suctioned of clot and examined with a 0  degree telescope.  Dissection was then performed using a Freer elevator around the periphery of the orbital contents exposing the native bony edges both medially and laterally.  The Leibinger dissolvable orbital implant was then opened and trimmed first  to a 2.5 x 2 cm size and was then trimmed a little bit further to be able to get into the maxillary sinus through the antrostomy.  An effort was then made to try to get it to slide under the bony edges first medially and then laterally, but the implant  had to be trimmed further making it more or less deep in shape.  It was placed in the hot bath for a couple of minutes and then allowed to be malleable and was given a little bit of a convex shape.  With further effort, the orbital contents were able to  be reduced while the implant was placed  first under the medial edge and then snapping into the lateral edge.  Once that was in place, the sinus was suctioned.  A forced duction test was then performed on the eye and was found to be with good mobility  around the inferior rectus muscle.  The sinus was copiously irrigated with saline and suctioned.  The sublabial incision was closed with 3-0 Monocryl suture in a  simple running fashion.  The throat was suctioned and the patient was cleaned off and  returned to Anesthesia for wake up and was extubated in the recovery room in stable condition.  TN/NUANCE  D:11/17/2017 T:11/17/2017 JOB:[id number]

## 2017-11-17 NOTE — Anesthesia Preprocedure Evaluation (Addendum)
Anesthesia Evaluation  Patient identified by MRN, date of birth, ID band Patient awake    Reviewed: Allergy & Precautions, NPO status , Patient's Chart, lab work & pertinent test results  Airway Mallampati: I  TM Distance: >3 FB Neck ROM: Full    Dental  (+) Poor Dentition, Loose, Chipped, Missing,    Pulmonary asthma , former smoker,    breath sounds clear to auscultation (-) decreased breath sounds      Cardiovascular hypertension, Pt. on home beta blockers + dysrhythmias Atrial Fibrillation  Rhythm:Regular Rate:Bradycardia     Neuro/Psych Seizures -,  PSYCHIATRIC DISORDERS Schizophrenia    GI/Hepatic Neg liver ROS, hiatal hernia, GERD  ,  Endo/Other  negative endocrine ROS  Renal/GU negative Renal ROS     Musculoskeletal  (+) Arthritis , Osteoarthritis,    Abdominal Normal abdominal exam  (+)   Peds  Hematology   Anesthesia Other Findings   Reproductive/Obstetrics                            Anesthesia Physical Anesthesia Plan  ASA: III  Anesthesia Plan: General   Post-op Pain Management:    Induction: Intravenous  PONV Risk Score and Plan: 4 or greater and Dexamethasone, Ondansetron, Midazolam and Scopolamine patch - Pre-op  Airway Management Planned: Oral ETT  Additional Equipment: None  Intra-op Plan:   Post-operative Plan: Extubation in OR  Informed Consent: I have reviewed the patients History and Physical, chart, labs and discussed the procedure including the risks, benefits and alternatives for the proposed anesthesia with the patient or authorized representative who has indicated his/her understanding and acceptance.   Dental advisory given  Plan Discussed with: CRNA  Anesthesia Plan Comments:        Anesthesia Quick Evaluation

## 2017-11-17 NOTE — Discharge Instructions (Signed)

## 2017-11-17 NOTE — Transfer of Care (Signed)
Immediate Anesthesia Transfer of Care Note  Patient: Nicole [name]  Procedure(s) Performed: Trans Antral Left sided orbital floor fracture repair with dissovable floor implant (Left Face) ENDOSCOPIC SINUS approach (Left Face)  Patient Location: PACU  Anesthesia Type:General  Level of Consciousness: drowsy  Airway & Oxygen Therapy: Patient Spontanous Breathing and Patient connected to face mask oxygen  Post-op Assessment: Report given to RN and Post -op Vital signs reviewed and stable  Post vital signs: Reviewed and stable  Last Vitals:  Vitals Value Taken Time  BP 145/77 11/17/2017  2:55 PM  Temp    Pulse 81 11/17/2017  2:57 PM  Resp 16 11/17/2017  2:58 PM  SpO2 99 % 11/17/2017  2:57 PM  Vitals shown include unvalidated device data.  Last Pain:  Vitals:   11/17/17 1017  TempSrc:   PainSc: 3          Complications: No apparent anesthesia complications

## 2017-11-17 NOTE — Anesthesia Procedure Notes (Signed)
Procedure Name: Intubation Date/Time: 11/17/2017 1:08 PM Performed by: Doran Clay, CRNA Pre-anesthesia Checklist: Patient identified, Emergency Drugs available, Suction available, Patient being monitored and Timeout performed Patient Re-evaluated:Patient Re-evaluated prior to induction Oxygen Delivery Method: Circle system utilized Preoxygenation: Pre-oxygenation with 100% oxygen Induction Type: IV induction Ventilation: Mask ventilation without difficulty Laryngoscope Size: Miller and 2 Grade View: Grade I Tube type: Oral Tube size: 7.0 mm Number of attempts: 1 Airway Equipment and Method: Stylet Placement Confirmation: ETT inserted through vocal cords under direct vision,  positive ETCO2 and breath sounds checked- equal and bilateral Secured at: 22 cm Tube secured with: Tape Dental Injury: Teeth and Oropharynx as per pre-operative assessment

## 2017-11-19 NOTE — Anesthesia Postprocedure Evaluation (Signed)
Anesthesia Post Note  Patient: Nicole [name]  Procedure(s) Performed: Trans Antral Left sided orbital floor fracture repair with dissovable floor implant (Left Face) ENDOSCOPIC SINUS approach (Left Face)     Patient location during evaluation: PACU Anesthesia Type: General Level of consciousness: awake and alert Pain management: pain level controlled Vital Signs Assessment: post-procedure vital signs reviewed and stable Respiratory status: spontaneous breathing, nonlabored ventilation, respiratory function stable and patient connected to nasal cannula oxygen Cardiovascular status: blood pressure returned to baseline and stable Postop Assessment: no apparent nausea or vomiting Anesthetic complications: no    Last Vitals:  Vitals:   11/17/17 1640 11/17/17 1656  BP: (!) 170/87 (!) 170/89  Pulse: 83 80  Resp: (!) 22 18  Temp: (!) 36.2 C   SpO2: 91% 95%    Last Pain:  Vitals:   11/17/17 1656  TempSrc:   PainSc: 3                  Effie Berkshire

## 2017-11-22 ENCOUNTER — Encounter (HOSPITAL_COMMUNITY): Payer: Self-pay | Admitting: Otolaryngology

## 2017-12-01 DIAGNOSIS — Z87891 Personal history of nicotine dependence: Secondary | ICD-10-CM | POA: Diagnosis not present

## 2017-12-01 DIAGNOSIS — L0201 Cutaneous abscess of face: Secondary | ICD-10-CM | POA: Diagnosis not present

## 2017-12-01 DIAGNOSIS — Z7289 Other problems related to lifestyle: Secondary | ICD-10-CM | POA: Diagnosis not present

## 2017-12-01 DIAGNOSIS — S0232XG Fracture of orbital floor, left side, subsequent encounter for fracture with delayed healing: Secondary | ICD-10-CM | POA: Diagnosis not present

## 2017-12-06 DIAGNOSIS — Z0100 Encounter for examination of eyes and vision without abnormal findings: Secondary | ICD-10-CM | POA: Diagnosis not present

## 2017-12-06 DIAGNOSIS — H524 Presbyopia: Secondary | ICD-10-CM | POA: Diagnosis not present

## 2017-12-08 DIAGNOSIS — Z87891 Personal history of nicotine dependence: Secondary | ICD-10-CM | POA: Diagnosis not present

## 2017-12-08 DIAGNOSIS — S0232XD Fracture of orbital floor, left side, subsequent encounter for fracture with routine healing: Secondary | ICD-10-CM | POA: Diagnosis not present

## 2017-12-08 DIAGNOSIS — L0201 Cutaneous abscess of face: Secondary | ICD-10-CM | POA: Diagnosis not present

## 2017-12-08 DIAGNOSIS — Z7289 Other problems related to lifestyle: Secondary | ICD-10-CM | POA: Diagnosis not present

## 2017-12-21 DIAGNOSIS — I10 Essential (primary) hypertension: Secondary | ICD-10-CM | POA: Diagnosis not present

## 2017-12-21 DIAGNOSIS — L0291 Cutaneous abscess, unspecified: Secondary | ICD-10-CM | POA: Diagnosis not present

## 2018-03-22 DIAGNOSIS — S7002XD Contusion of left hip, subsequent encounter: Secondary | ICD-10-CM | POA: Diagnosis not present

## 2018-03-22 DIAGNOSIS — I10 Essential (primary) hypertension: Secondary | ICD-10-CM | POA: Diagnosis not present

## 2018-06-21 DIAGNOSIS — I10 Essential (primary) hypertension: Secondary | ICD-10-CM | POA: Diagnosis not present

## 2018-06-21 DIAGNOSIS — M25552 Pain in left hip: Secondary | ICD-10-CM | POA: Diagnosis not present

## 2018-06-27 DIAGNOSIS — R0902 Hypoxemia: Secondary | ICD-10-CM | POA: Diagnosis not present

## 2018-06-27 DIAGNOSIS — Z79899 Other long term (current) drug therapy: Secondary | ICD-10-CM | POA: Diagnosis not present

## 2018-06-27 DIAGNOSIS — S82142A Displaced bicondylar fracture of left tibia, initial encounter for closed fracture: Secondary | ICD-10-CM | POA: Diagnosis not present

## 2018-06-27 DIAGNOSIS — W1839XA Other fall on same level, initial encounter: Secondary | ICD-10-CM | POA: Diagnosis not present

## 2018-06-27 DIAGNOSIS — M25562 Pain in left knee: Secondary | ICD-10-CM | POA: Diagnosis not present

## 2018-06-27 DIAGNOSIS — R569 Unspecified convulsions: Secondary | ICD-10-CM | POA: Diagnosis not present

## 2018-06-27 DIAGNOSIS — I10 Essential (primary) hypertension: Secondary | ICD-10-CM | POA: Diagnosis not present

## 2018-06-27 DIAGNOSIS — R52 Pain, unspecified: Secondary | ICD-10-CM | POA: Diagnosis not present

## 2018-06-27 DIAGNOSIS — Z91041 Radiographic dye allergy status: Secondary | ICD-10-CM | POA: Diagnosis not present

## 2018-07-02 DIAGNOSIS — S82142A Displaced bicondylar fracture of left tibia, initial encounter for closed fracture: Secondary | ICD-10-CM | POA: Diagnosis not present

## 2018-07-03 DIAGNOSIS — C73 Malignant neoplasm of thyroid gland: Secondary | ICD-10-CM | POA: Diagnosis not present

## 2018-07-03 DIAGNOSIS — Z9181 History of falling: Secondary | ICD-10-CM | POA: Diagnosis not present

## 2018-07-03 DIAGNOSIS — M6281 Muscle weakness (generalized): Secondary | ICD-10-CM | POA: Diagnosis not present

## 2018-07-03 DIAGNOSIS — R569 Unspecified convulsions: Secondary | ICD-10-CM | POA: Diagnosis not present

## 2018-07-03 DIAGNOSIS — I10 Essential (primary) hypertension: Secondary | ICD-10-CM | POA: Diagnosis not present

## 2018-07-03 DIAGNOSIS — Z87891 Personal history of nicotine dependence: Secondary | ICD-10-CM | POA: Diagnosis not present

## 2018-07-03 DIAGNOSIS — Z85528 Personal history of other malignant neoplasm of kidney: Secondary | ICD-10-CM | POA: Diagnosis not present

## 2018-07-03 DIAGNOSIS — S82142A Displaced bicondylar fracture of left tibia, initial encounter for closed fracture: Secondary | ICD-10-CM | POA: Diagnosis not present

## 2018-07-03 DIAGNOSIS — Z1159 Encounter for screening for other viral diseases: Secondary | ICD-10-CM | POA: Diagnosis not present

## 2018-07-05 DIAGNOSIS — C73 Malignant neoplasm of thyroid gland: Secondary | ICD-10-CM | POA: Diagnosis not present

## 2018-07-05 DIAGNOSIS — S82142A Displaced bicondylar fracture of left tibia, initial encounter for closed fracture: Secondary | ICD-10-CM | POA: Diagnosis not present

## 2018-07-05 DIAGNOSIS — Z1159 Encounter for screening for other viral diseases: Secondary | ICD-10-CM | POA: Diagnosis not present

## 2018-07-05 DIAGNOSIS — Z85528 Personal history of other malignant neoplasm of kidney: Secondary | ICD-10-CM | POA: Diagnosis not present

## 2018-07-05 DIAGNOSIS — I10 Essential (primary) hypertension: Secondary | ICD-10-CM | POA: Diagnosis not present

## 2018-07-05 DIAGNOSIS — S82102A Unspecified fracture of upper end of left tibia, initial encounter for closed fracture: Secondary | ICD-10-CM | POA: Diagnosis not present

## 2018-07-05 DIAGNOSIS — S82202D Unspecified fracture of shaft of left tibia, subsequent encounter for closed fracture with routine healing: Secondary | ICD-10-CM | POA: Diagnosis not present

## 2018-07-05 DIAGNOSIS — Z87891 Personal history of nicotine dependence: Secondary | ICD-10-CM | POA: Diagnosis not present

## 2018-07-05 DIAGNOSIS — Z4889 Encounter for other specified surgical aftercare: Secondary | ICD-10-CM | POA: Diagnosis not present

## 2018-07-05 DIAGNOSIS — R569 Unspecified convulsions: Secondary | ICD-10-CM | POA: Diagnosis not present

## 2018-07-05 DIAGNOSIS — Z9181 History of falling: Secondary | ICD-10-CM | POA: Diagnosis not present

## 2018-07-05 DIAGNOSIS — M6281 Muscle weakness (generalized): Secondary | ICD-10-CM | POA: Diagnosis not present

## 2018-07-05 DIAGNOSIS — S82142D Displaced bicondylar fracture of left tibia, subsequent encounter for closed fracture with routine healing: Secondary | ICD-10-CM | POA: Diagnosis not present

## 2018-07-06 DIAGNOSIS — E871 Hypo-osmolality and hyponatremia: Secondary | ICD-10-CM | POA: Diagnosis not present

## 2018-07-06 DIAGNOSIS — Z87891 Personal history of nicotine dependence: Secondary | ICD-10-CM | POA: Diagnosis not present

## 2018-07-06 DIAGNOSIS — R569 Unspecified convulsions: Secondary | ICD-10-CM | POA: Diagnosis not present

## 2018-07-06 DIAGNOSIS — S82142A Displaced bicondylar fracture of left tibia, initial encounter for closed fracture: Secondary | ICD-10-CM | POA: Diagnosis not present

## 2018-07-06 DIAGNOSIS — Z85528 Personal history of other malignant neoplasm of kidney: Secondary | ICD-10-CM | POA: Diagnosis not present

## 2018-07-06 DIAGNOSIS — Z720 Tobacco use: Secondary | ICD-10-CM | POA: Diagnosis not present

## 2018-07-06 DIAGNOSIS — I10 Essential (primary) hypertension: Secondary | ICD-10-CM | POA: Diagnosis not present

## 2018-07-06 DIAGNOSIS — M6281 Muscle weakness (generalized): Secondary | ICD-10-CM | POA: Diagnosis not present

## 2018-07-06 DIAGNOSIS — C73 Malignant neoplasm of thyroid gland: Secondary | ICD-10-CM | POA: Diagnosis not present

## 2018-07-06 DIAGNOSIS — Z1159 Encounter for screening for other viral diseases: Secondary | ICD-10-CM | POA: Diagnosis not present

## 2018-07-06 DIAGNOSIS — Z9181 History of falling: Secondary | ICD-10-CM | POA: Diagnosis not present

## 2018-07-06 DIAGNOSIS — R296 Repeated falls: Secondary | ICD-10-CM | POA: Diagnosis not present

## 2018-07-07 DIAGNOSIS — I10 Essential (primary) hypertension: Secondary | ICD-10-CM | POA: Diagnosis not present

## 2018-07-07 DIAGNOSIS — S82892A Other fracture of left lower leg, initial encounter for closed fracture: Secondary | ICD-10-CM | POA: Diagnosis not present

## 2018-07-07 DIAGNOSIS — Z820 Family history of epilepsy and other diseases of the nervous system: Secondary | ICD-10-CM | POA: Diagnosis not present

## 2018-07-07 DIAGNOSIS — Z1159 Encounter for screening for other viral diseases: Secondary | ICD-10-CM | POA: Diagnosis not present

## 2018-07-07 DIAGNOSIS — R569 Unspecified convulsions: Secondary | ICD-10-CM | POA: Diagnosis not present

## 2018-07-07 DIAGNOSIS — M6281 Muscle weakness (generalized): Secondary | ICD-10-CM | POA: Diagnosis not present

## 2018-07-07 DIAGNOSIS — Z87891 Personal history of nicotine dependence: Secondary | ICD-10-CM | POA: Diagnosis not present

## 2018-07-07 DIAGNOSIS — C73 Malignant neoplasm of thyroid gland: Secondary | ICD-10-CM | POA: Diagnosis not present

## 2018-07-07 DIAGNOSIS — Z9181 History of falling: Secondary | ICD-10-CM | POA: Diagnosis not present

## 2018-07-07 DIAGNOSIS — Z85528 Personal history of other malignant neoplasm of kidney: Secondary | ICD-10-CM | POA: Diagnosis not present

## 2018-07-07 DIAGNOSIS — S82142A Displaced bicondylar fracture of left tibia, initial encounter for closed fracture: Secondary | ICD-10-CM | POA: Diagnosis not present

## 2018-07-08 DIAGNOSIS — I10 Essential (primary) hypertension: Secondary | ICD-10-CM | POA: Diagnosis not present

## 2018-07-08 DIAGNOSIS — M6281 Muscle weakness (generalized): Secondary | ICD-10-CM | POA: Diagnosis not present

## 2018-07-08 DIAGNOSIS — Z1159 Encounter for screening for other viral diseases: Secondary | ICD-10-CM | POA: Diagnosis not present

## 2018-07-08 DIAGNOSIS — R569 Unspecified convulsions: Secondary | ICD-10-CM | POA: Diagnosis not present

## 2018-07-08 DIAGNOSIS — Z9181 History of falling: Secondary | ICD-10-CM | POA: Diagnosis not present

## 2018-07-08 DIAGNOSIS — Z85528 Personal history of other malignant neoplasm of kidney: Secondary | ICD-10-CM | POA: Diagnosis not present

## 2018-07-08 DIAGNOSIS — Z87891 Personal history of nicotine dependence: Secondary | ICD-10-CM | POA: Diagnosis not present

## 2018-07-08 DIAGNOSIS — C73 Malignant neoplasm of thyroid gland: Secondary | ICD-10-CM | POA: Diagnosis not present

## 2018-07-08 DIAGNOSIS — S82142A Displaced bicondylar fracture of left tibia, initial encounter for closed fracture: Secondary | ICD-10-CM | POA: Diagnosis not present

## 2018-07-09 DIAGNOSIS — M6281 Muscle weakness (generalized): Secondary | ICD-10-CM | POA: Diagnosis not present

## 2018-07-09 DIAGNOSIS — Z9181 History of falling: Secondary | ICD-10-CM | POA: Diagnosis not present

## 2018-07-09 DIAGNOSIS — Z85528 Personal history of other malignant neoplasm of kidney: Secondary | ICD-10-CM | POA: Diagnosis not present

## 2018-07-09 DIAGNOSIS — R569 Unspecified convulsions: Secondary | ICD-10-CM | POA: Diagnosis not present

## 2018-07-09 DIAGNOSIS — C73 Malignant neoplasm of thyroid gland: Secondary | ICD-10-CM | POA: Diagnosis not present

## 2018-07-09 DIAGNOSIS — Z1159 Encounter for screening for other viral diseases: Secondary | ICD-10-CM | POA: Diagnosis not present

## 2018-07-09 DIAGNOSIS — S82142A Displaced bicondylar fracture of left tibia, initial encounter for closed fracture: Secondary | ICD-10-CM | POA: Diagnosis not present

## 2018-07-09 DIAGNOSIS — I10 Essential (primary) hypertension: Secondary | ICD-10-CM | POA: Diagnosis not present

## 2018-07-09 DIAGNOSIS — Z87891 Personal history of nicotine dependence: Secondary | ICD-10-CM | POA: Diagnosis not present

## 2018-07-10 DIAGNOSIS — M6281 Muscle weakness (generalized): Secondary | ICD-10-CM | POA: Diagnosis not present

## 2018-07-10 DIAGNOSIS — I10 Essential (primary) hypertension: Secondary | ICD-10-CM | POA: Diagnosis not present

## 2018-07-10 DIAGNOSIS — Z1159 Encounter for screening for other viral diseases: Secondary | ICD-10-CM | POA: Diagnosis not present

## 2018-07-10 DIAGNOSIS — R569 Unspecified convulsions: Secondary | ICD-10-CM | POA: Diagnosis not present

## 2018-07-10 DIAGNOSIS — Z9181 History of falling: Secondary | ICD-10-CM | POA: Diagnosis not present

## 2018-07-10 DIAGNOSIS — Z85528 Personal history of other malignant neoplasm of kidney: Secondary | ICD-10-CM | POA: Diagnosis not present

## 2018-07-10 DIAGNOSIS — Z87891 Personal history of nicotine dependence: Secondary | ICD-10-CM | POA: Diagnosis not present

## 2018-07-10 DIAGNOSIS — S82142A Displaced bicondylar fracture of left tibia, initial encounter for closed fracture: Secondary | ICD-10-CM | POA: Diagnosis not present

## 2018-07-10 DIAGNOSIS — C73 Malignant neoplasm of thyroid gland: Secondary | ICD-10-CM | POA: Diagnosis not present

## 2018-07-11 DIAGNOSIS — S82142A Displaced bicondylar fracture of left tibia, initial encounter for closed fracture: Secondary | ICD-10-CM | POA: Diagnosis not present

## 2018-07-11 DIAGNOSIS — Z85528 Personal history of other malignant neoplasm of kidney: Secondary | ICD-10-CM | POA: Diagnosis not present

## 2018-07-11 DIAGNOSIS — J449 Chronic obstructive pulmonary disease, unspecified: Secondary | ICD-10-CM | POA: Diagnosis not present

## 2018-07-11 DIAGNOSIS — Z87891 Personal history of nicotine dependence: Secondary | ICD-10-CM | POA: Diagnosis not present

## 2018-07-11 DIAGNOSIS — G4089 Other seizures: Secondary | ICD-10-CM | POA: Diagnosis not present

## 2018-07-11 DIAGNOSIS — Z1159 Encounter for screening for other viral diseases: Secondary | ICD-10-CM | POA: Diagnosis not present

## 2018-07-11 DIAGNOSIS — C73 Malignant neoplasm of thyroid gland: Secondary | ICD-10-CM | POA: Diagnosis not present

## 2018-07-11 DIAGNOSIS — F1010 Alcohol abuse, uncomplicated: Secondary | ICD-10-CM | POA: Diagnosis not present

## 2018-07-11 DIAGNOSIS — R569 Unspecified convulsions: Secondary | ICD-10-CM | POA: Diagnosis not present

## 2018-07-11 DIAGNOSIS — S82122D Displaced fracture of lateral condyle of left tibia, subsequent encounter for closed fracture with routine healing: Secondary | ICD-10-CM | POA: Diagnosis not present

## 2018-07-11 DIAGNOSIS — M6281 Muscle weakness (generalized): Secondary | ICD-10-CM | POA: Diagnosis not present

## 2018-07-11 DIAGNOSIS — Z9181 History of falling: Secondary | ICD-10-CM | POA: Diagnosis not present

## 2018-07-11 DIAGNOSIS — I10 Essential (primary) hypertension: Secondary | ICD-10-CM | POA: Diagnosis not present

## 2018-07-24 DIAGNOSIS — S82142D Displaced bicondylar fracture of left tibia, subsequent encounter for closed fracture with routine healing: Secondary | ICD-10-CM | POA: Diagnosis not present

## 2018-10-26 DIAGNOSIS — Z0001 Encounter for general adult medical examination with abnormal findings: Secondary | ICD-10-CM | POA: Diagnosis not present

## 2018-10-26 DIAGNOSIS — Z23 Encounter for immunization: Secondary | ICD-10-CM | POA: Diagnosis not present

## 2018-10-26 DIAGNOSIS — I10 Essential (primary) hypertension: Secondary | ICD-10-CM | POA: Diagnosis not present

## 2018-10-26 DIAGNOSIS — Z6830 Body mass index (BMI) 30.0-30.9, adult: Secondary | ICD-10-CM | POA: Diagnosis not present

## 2018-10-26 DIAGNOSIS — M1990 Unspecified osteoarthritis, unspecified site: Secondary | ICD-10-CM | POA: Diagnosis not present

## 2018-10-26 DIAGNOSIS — Z1331 Encounter for screening for depression: Secondary | ICD-10-CM | POA: Diagnosis not present

## 2018-10-26 DIAGNOSIS — Z1389 Encounter for screening for other disorder: Secondary | ICD-10-CM | POA: Diagnosis not present

## 2018-10-31 DIAGNOSIS — T1490XA Injury, unspecified, initial encounter: Secondary | ICD-10-CM | POA: Diagnosis not present

## 2018-10-31 DIAGNOSIS — R5381 Other malaise: Secondary | ICD-10-CM | POA: Diagnosis not present

## 2018-11-25 DIAGNOSIS — M549 Dorsalgia, unspecified: Secondary | ICD-10-CM | POA: Diagnosis not present

## 2018-11-25 DIAGNOSIS — I10 Essential (primary) hypertension: Secondary | ICD-10-CM | POA: Diagnosis not present

## 2018-12-26 DIAGNOSIS — E049 Nontoxic goiter, unspecified: Secondary | ICD-10-CM | POA: Diagnosis not present

## 2018-12-26 DIAGNOSIS — M549 Dorsalgia, unspecified: Secondary | ICD-10-CM | POA: Diagnosis not present

## 2019-01-28 DIAGNOSIS — M549 Dorsalgia, unspecified: Secondary | ICD-10-CM | POA: Diagnosis not present

## 2019-01-28 DIAGNOSIS — M1990 Unspecified osteoarthritis, unspecified site: Secondary | ICD-10-CM | POA: Diagnosis not present

## 2019-02-25 IMAGING — DX DG SHOULDER 1V*L*
2 series · 2 of 2 positions shown · non-contrast
Comparison: Chest radiographs and CT Abdomen and Pelvis today
reported separately.

CLINICAL DATA: 65-year-old female with recurrent left shoulder
pain. Fall 1 year ago.

EXAM:
LEFT SHOULDER - 1 VIEW

[shoulder obl (1 of 2)]
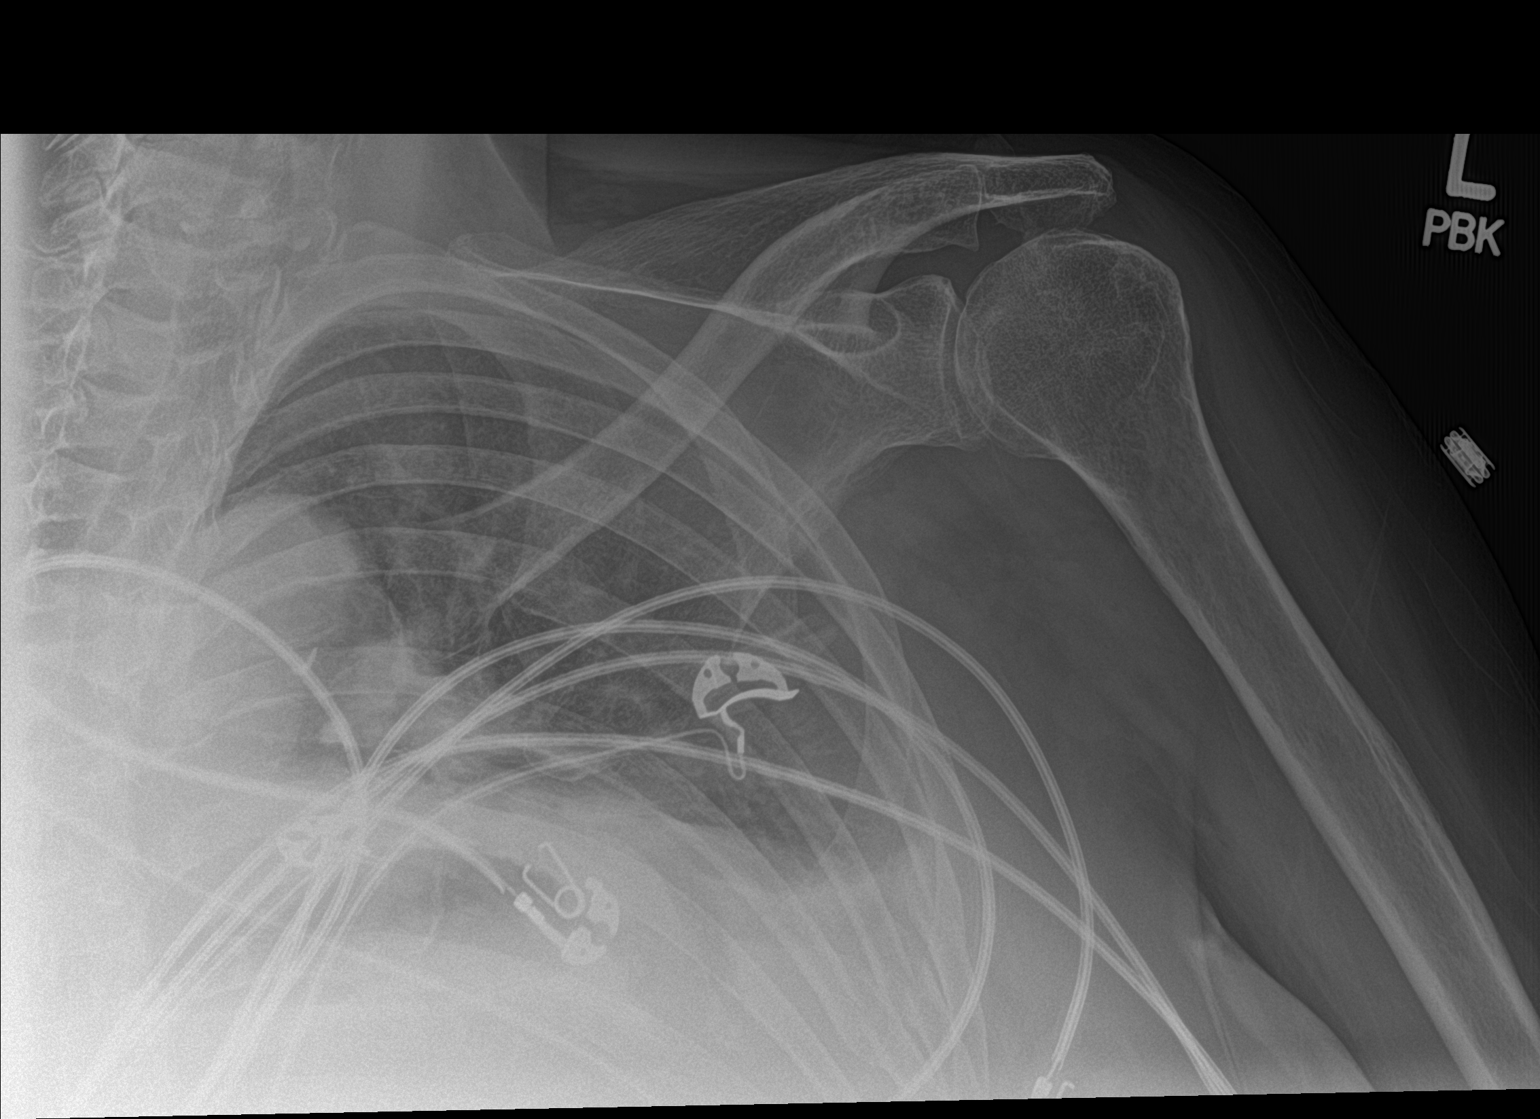

[shoulder obl (2 of 2)]
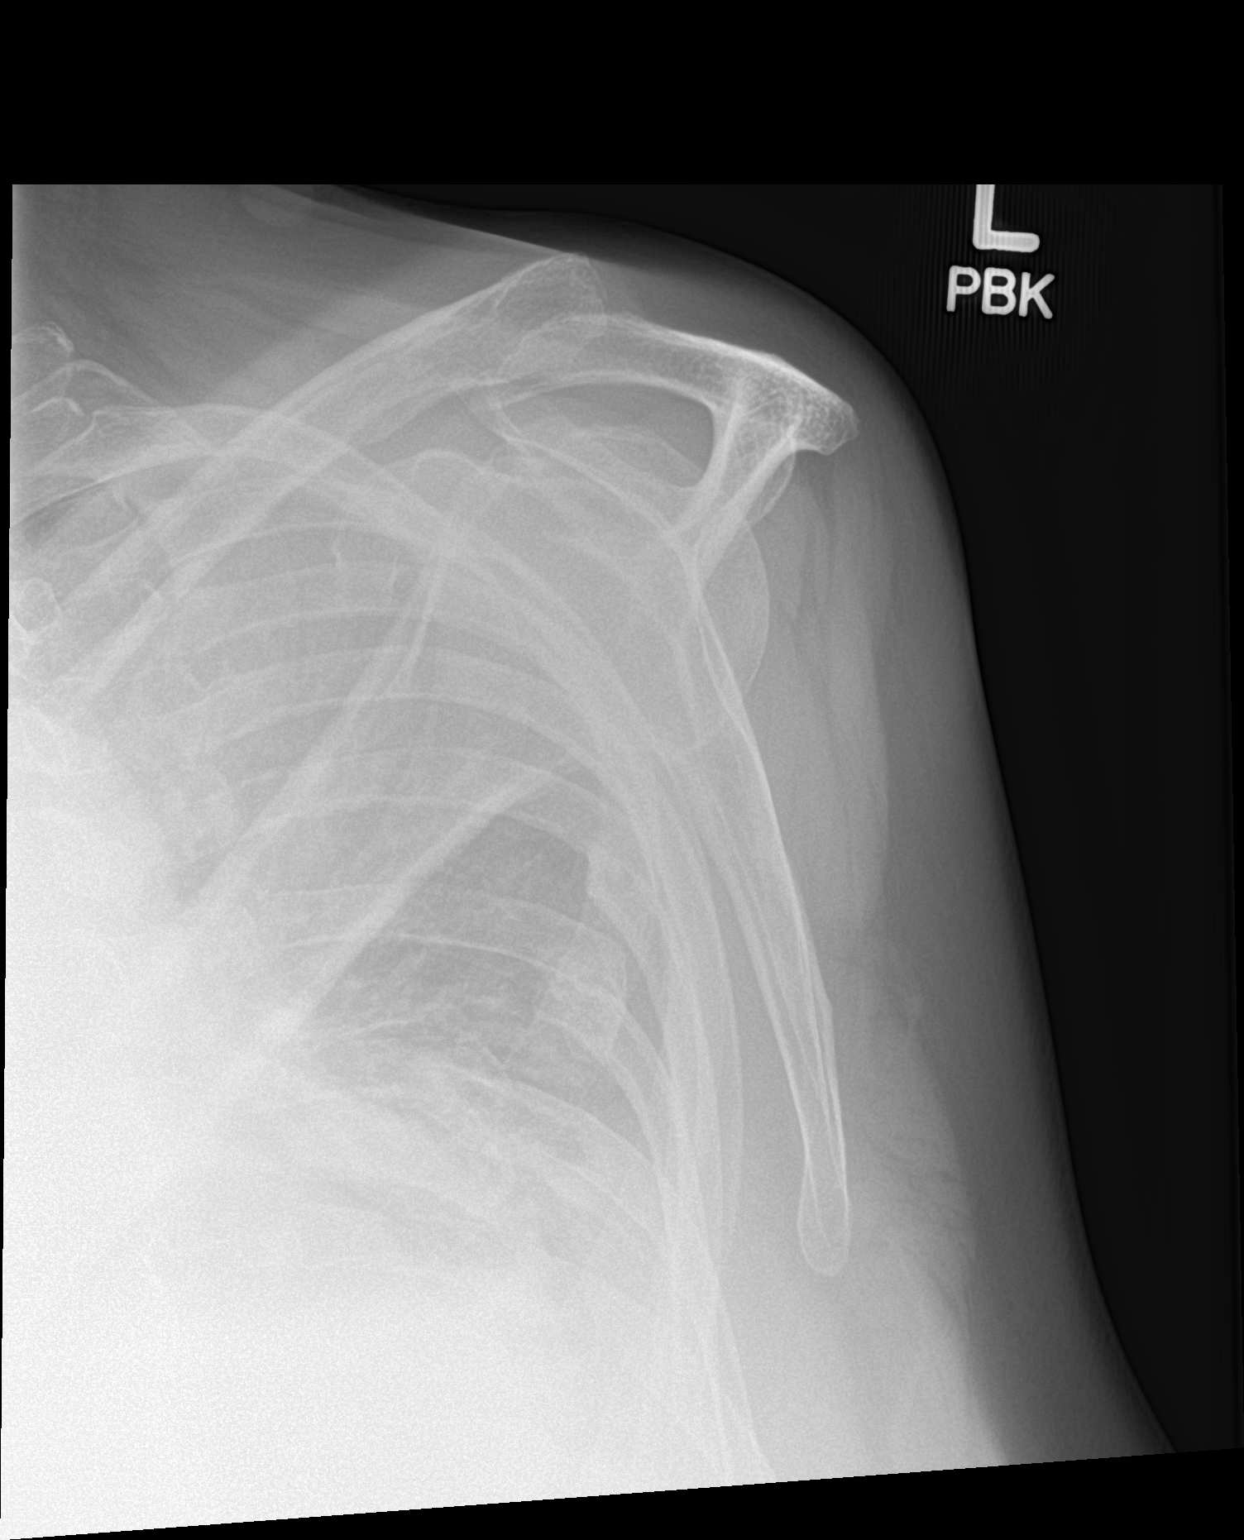

[2 of 2 positions shown; findings below may reference images not displayed]

FINDINGS: Portable AP and scapular Y-view of the left shoulder. No
glenohumeral joint dislocation. Visible left humerus intact. Left
clavicle and scapula appear intact. Veiling opacity at the left lung
base. There are displaced fractures of the lateral left fifth
through seventh ribs but these were partially visible and appeared
chronic on the CT today.
IMPRESSION: 1.  No acute osseous abnormality identified.  At the left shoulder.
2. Abnormal left lung base, see CT Abdomen and Pelvis report today.
3. Chronic appearing left lateral rib fractures.

## 2019-02-28 DIAGNOSIS — I10 Essential (primary) hypertension: Secondary | ICD-10-CM | POA: Diagnosis not present

## 2019-02-28 DIAGNOSIS — M549 Dorsalgia, unspecified: Secondary | ICD-10-CM | POA: Diagnosis not present

## 2019-03-23 DIAGNOSIS — R5381 Other malaise: Secondary | ICD-10-CM | POA: Diagnosis not present

## 2019-03-23 DIAGNOSIS — R69 Illness, unspecified: Secondary | ICD-10-CM | POA: Diagnosis not present

## 2019-05-12 DIAGNOSIS — F10929 Alcohol use, unspecified with intoxication, unspecified: Secondary | ICD-10-CM | POA: Diagnosis not present

## 2019-05-12 DIAGNOSIS — W19XXXA Unspecified fall, initial encounter: Secondary | ICD-10-CM | POA: Diagnosis not present

## 2019-08-05 DIAGNOSIS — R5381 Other malaise: Secondary | ICD-10-CM | POA: Diagnosis not present

## 2019-08-05 DIAGNOSIS — Z20822 Contact with and (suspected) exposure to covid-19: Secondary | ICD-10-CM | POA: Diagnosis not present

## 2019-08-05 DIAGNOSIS — K5730 Diverticulosis of large intestine without perforation or abscess without bleeding: Secondary | ICD-10-CM | POA: Diagnosis not present

## 2019-08-05 DIAGNOSIS — Z85528 Personal history of other malignant neoplasm of kidney: Secondary | ICD-10-CM | POA: Diagnosis not present

## 2019-08-05 DIAGNOSIS — K439 Ventral hernia without obstruction or gangrene: Secondary | ICD-10-CM | POA: Diagnosis not present

## 2019-08-05 DIAGNOSIS — G4700 Insomnia, unspecified: Secondary | ICD-10-CM | POA: Diagnosis not present

## 2019-08-05 DIAGNOSIS — M6281 Muscle weakness (generalized): Secondary | ICD-10-CM | POA: Diagnosis not present

## 2019-08-05 DIAGNOSIS — R197 Diarrhea, unspecified: Secondary | ICD-10-CM | POA: Diagnosis not present

## 2019-08-05 DIAGNOSIS — D649 Anemia, unspecified: Secondary | ICD-10-CM | POA: Diagnosis not present

## 2019-08-05 DIAGNOSIS — E876 Hypokalemia: Secondary | ICD-10-CM | POA: Diagnosis not present

## 2019-08-05 DIAGNOSIS — K529 Noninfective gastroenteritis and colitis, unspecified: Secondary | ICD-10-CM | POA: Diagnosis not present

## 2019-08-05 DIAGNOSIS — Z87891 Personal history of nicotine dependence: Secondary | ICD-10-CM | POA: Diagnosis not present

## 2019-08-05 DIAGNOSIS — I10 Essential (primary) hypertension: Secondary | ICD-10-CM | POA: Diagnosis not present

## 2019-08-05 DIAGNOSIS — I4891 Unspecified atrial fibrillation: Secondary | ICD-10-CM | POA: Diagnosis not present

## 2019-08-05 DIAGNOSIS — R000 Tachycardia, unspecified: Secondary | ICD-10-CM | POA: Diagnosis not present

## 2019-08-05 DIAGNOSIS — R2689 Other abnormalities of gait and mobility: Secondary | ICD-10-CM | POA: Diagnosis not present

## 2019-08-05 DIAGNOSIS — D538 Other specified nutritional anemias: Secondary | ICD-10-CM | POA: Diagnosis not present

## 2019-08-05 DIAGNOSIS — E43 Unspecified severe protein-calorie malnutrition: Secondary | ICD-10-CM | POA: Diagnosis not present

## 2019-08-05 DIAGNOSIS — K760 Fatty (change of) liver, not elsewhere classified: Secondary | ICD-10-CM | POA: Diagnosis not present

## 2019-08-05 DIAGNOSIS — R531 Weakness: Secondary | ICD-10-CM | POA: Diagnosis not present

## 2019-08-05 DIAGNOSIS — E860 Dehydration: Secondary | ICD-10-CM | POA: Diagnosis not present

## 2019-08-05 DIAGNOSIS — F1010 Alcohol abuse, uncomplicated: Secondary | ICD-10-CM | POA: Diagnosis not present

## 2019-08-05 DIAGNOSIS — Z91041 Radiographic dye allergy status: Secondary | ICD-10-CM | POA: Diagnosis not present

## 2019-08-05 DIAGNOSIS — F1020 Alcohol dependence, uncomplicated: Secondary | ICD-10-CM | POA: Diagnosis not present

## 2019-08-06 DIAGNOSIS — Z905 Acquired absence of kidney: Secondary | ICD-10-CM | POA: Insufficient documentation

## 2019-08-06 DIAGNOSIS — K529 Noninfective gastroenteritis and colitis, unspecified: Secondary | ICD-10-CM | POA: Insufficient documentation

## 2019-08-07 DIAGNOSIS — E43 Unspecified severe protein-calorie malnutrition: Secondary | ICD-10-CM | POA: Insufficient documentation

## 2019-08-09 DIAGNOSIS — F1020 Alcohol dependence, uncomplicated: Secondary | ICD-10-CM | POA: Diagnosis not present

## 2019-08-09 DIAGNOSIS — E441 Mild protein-calorie malnutrition: Secondary | ICD-10-CM | POA: Diagnosis not present

## 2019-08-09 DIAGNOSIS — R2689 Other abnormalities of gait and mobility: Secondary | ICD-10-CM | POA: Diagnosis not present

## 2019-08-09 DIAGNOSIS — M6281 Muscle weakness (generalized): Secondary | ICD-10-CM | POA: Diagnosis not present

## 2019-08-09 DIAGNOSIS — E43 Unspecified severe protein-calorie malnutrition: Secondary | ICD-10-CM | POA: Diagnosis not present

## 2019-08-09 DIAGNOSIS — D649 Anemia, unspecified: Secondary | ICD-10-CM | POA: Diagnosis not present

## 2019-08-09 DIAGNOSIS — F1010 Alcohol abuse, uncomplicated: Secondary | ICD-10-CM | POA: Diagnosis not present

## 2019-08-09 DIAGNOSIS — R69 Illness, unspecified: Secondary | ICD-10-CM | POA: Diagnosis not present

## 2019-08-09 DIAGNOSIS — G4700 Insomnia, unspecified: Secondary | ICD-10-CM | POA: Diagnosis not present

## 2019-08-09 DIAGNOSIS — R000 Tachycardia, unspecified: Secondary | ICD-10-CM | POA: Insufficient documentation

## 2019-08-09 DIAGNOSIS — I10 Essential (primary) hypertension: Secondary | ICD-10-CM | POA: Diagnosis not present

## 2019-08-09 DIAGNOSIS — E876 Hypokalemia: Secondary | ICD-10-CM | POA: Diagnosis not present

## 2019-08-09 DIAGNOSIS — K529 Noninfective gastroenteritis and colitis, unspecified: Secondary | ICD-10-CM | POA: Diagnosis not present

## 2019-08-09 DIAGNOSIS — W19XXXA Unspecified fall, initial encounter: Secondary | ICD-10-CM | POA: Diagnosis not present

## 2019-08-09 DIAGNOSIS — K219 Gastro-esophageal reflux disease without esophagitis: Secondary | ICD-10-CM | POA: Diagnosis not present

## 2019-08-09 DIAGNOSIS — D538 Other specified nutritional anemias: Secondary | ICD-10-CM | POA: Diagnosis not present

## 2019-08-09 DIAGNOSIS — R197 Diarrhea, unspecified: Secondary | ICD-10-CM | POA: Diagnosis not present

## 2019-08-11 DIAGNOSIS — F1010 Alcohol abuse, uncomplicated: Secondary | ICD-10-CM | POA: Diagnosis not present

## 2019-08-11 DIAGNOSIS — E441 Mild protein-calorie malnutrition: Secondary | ICD-10-CM | POA: Diagnosis not present

## 2019-08-11 DIAGNOSIS — R197 Diarrhea, unspecified: Secondary | ICD-10-CM | POA: Diagnosis not present

## 2019-08-11 DIAGNOSIS — K219 Gastro-esophageal reflux disease without esophagitis: Secondary | ICD-10-CM | POA: Diagnosis not present

## 2019-08-30 DIAGNOSIS — K529 Noninfective gastroenteritis and colitis, unspecified: Secondary | ICD-10-CM | POA: Diagnosis not present

## 2019-08-30 DIAGNOSIS — G4700 Insomnia, unspecified: Secondary | ICD-10-CM | POA: Diagnosis not present

## 2019-08-30 DIAGNOSIS — K449 Diaphragmatic hernia without obstruction or gangrene: Secondary | ICD-10-CM | POA: Diagnosis not present

## 2019-08-30 DIAGNOSIS — I10 Essential (primary) hypertension: Secondary | ICD-10-CM | POA: Diagnosis not present

## 2019-08-30 DIAGNOSIS — K219 Gastro-esophageal reflux disease without esophagitis: Secondary | ICD-10-CM | POA: Diagnosis not present

## 2019-08-30 DIAGNOSIS — F1020 Alcohol dependence, uncomplicated: Secondary | ICD-10-CM | POA: Diagnosis not present

## 2019-08-30 DIAGNOSIS — E43 Unspecified severe protein-calorie malnutrition: Secondary | ICD-10-CM | POA: Diagnosis not present

## 2019-08-30 DIAGNOSIS — D649 Anemia, unspecified: Secondary | ICD-10-CM | POA: Diagnosis not present

## 2019-09-03 DIAGNOSIS — D649 Anemia, unspecified: Secondary | ICD-10-CM | POA: Diagnosis not present

## 2019-09-03 DIAGNOSIS — K529 Noninfective gastroenteritis and colitis, unspecified: Secondary | ICD-10-CM | POA: Diagnosis not present

## 2019-09-03 DIAGNOSIS — K219 Gastro-esophageal reflux disease without esophagitis: Secondary | ICD-10-CM | POA: Diagnosis not present

## 2019-09-03 DIAGNOSIS — F1020 Alcohol dependence, uncomplicated: Secondary | ICD-10-CM | POA: Diagnosis not present

## 2019-09-03 DIAGNOSIS — I10 Essential (primary) hypertension: Secondary | ICD-10-CM | POA: Diagnosis not present

## 2019-09-03 DIAGNOSIS — E43 Unspecified severe protein-calorie malnutrition: Secondary | ICD-10-CM | POA: Diagnosis not present

## 2019-09-03 DIAGNOSIS — K449 Diaphragmatic hernia without obstruction or gangrene: Secondary | ICD-10-CM | POA: Diagnosis not present

## 2019-09-03 DIAGNOSIS — G4700 Insomnia, unspecified: Secondary | ICD-10-CM | POA: Diagnosis not present

## 2019-09-11 DIAGNOSIS — R002 Palpitations: Secondary | ICD-10-CM | POA: Diagnosis not present

## 2019-09-16 DIAGNOSIS — R000 Tachycardia, unspecified: Secondary | ICD-10-CM | POA: Diagnosis not present

## 2019-10-02 DIAGNOSIS — R5381 Other malaise: Secondary | ICD-10-CM | POA: Diagnosis not present

## 2019-10-31 ENCOUNTER — Emergency Department (HOSPITAL_COMMUNITY): Payer: Medicare HMO

## 2019-10-31 ENCOUNTER — Other Ambulatory Visit: Payer: Self-pay

## 2019-10-31 ENCOUNTER — Encounter (HOSPITAL_COMMUNITY): Payer: Self-pay | Admitting: *Deleted

## 2019-10-31 ENCOUNTER — Inpatient Hospital Stay (HOSPITAL_COMMUNITY)
Admission: EM | Admit: 2019-10-31 | Discharge: 2019-11-09 | DRG: 441 | Disposition: A | Discharge status: Skilled Nursing Facility | Payer: Medicare HMO | Attending: Internal Medicine | Admitting: Internal Medicine

## 2019-10-31 DIAGNOSIS — J9811 Atelectasis: Secondary | ICD-10-CM | POA: Diagnosis not present

## 2019-10-31 DIAGNOSIS — M79606 Pain in leg, unspecified: Secondary | ICD-10-CM

## 2019-10-31 DIAGNOSIS — R7989 Other specified abnormal findings of blood chemistry: Secondary | ICD-10-CM

## 2019-10-31 DIAGNOSIS — Z743 Need for continuous supervision: Secondary | ICD-10-CM | POA: Diagnosis not present

## 2019-10-31 DIAGNOSIS — Z452 Encounter for adjustment and management of vascular access device: Secondary | ICD-10-CM

## 2019-10-31 DIAGNOSIS — K760 Fatty (change of) liver, not elsewhere classified: Secondary | ICD-10-CM | POA: Diagnosis present

## 2019-10-31 DIAGNOSIS — R4182 Altered mental status, unspecified: Secondary | ICD-10-CM | POA: Diagnosis not present

## 2019-10-31 DIAGNOSIS — F1020 Alcohol dependence, uncomplicated: Secondary | ICD-10-CM | POA: Diagnosis present

## 2019-10-31 DIAGNOSIS — K863 Pseudocyst of pancreas: Secondary | ICD-10-CM | POA: Diagnosis present

## 2019-10-31 DIAGNOSIS — I4891 Unspecified atrial fibrillation: Secondary | ICD-10-CM | POA: Diagnosis present

## 2019-10-31 DIAGNOSIS — R29898 Other symptoms and signs involving the musculoskeletal system: Secondary | ICD-10-CM | POA: Diagnosis not present

## 2019-10-31 DIAGNOSIS — G40909 Epilepsy, unspecified, not intractable, without status epilepticus: Secondary | ICD-10-CM | POA: Diagnosis present

## 2019-10-31 DIAGNOSIS — Z20822 Contact with and (suspected) exposure to covid-19: Secondary | ICD-10-CM | POA: Diagnosis not present

## 2019-10-31 DIAGNOSIS — Z809 Family history of malignant neoplasm, unspecified: Secondary | ICD-10-CM

## 2019-10-31 DIAGNOSIS — Z833 Family history of diabetes mellitus: Secondary | ICD-10-CM

## 2019-10-31 DIAGNOSIS — R531 Weakness: Secondary | ICD-10-CM | POA: Diagnosis not present

## 2019-10-31 DIAGNOSIS — M25511 Pain in right shoulder: Secondary | ICD-10-CM | POA: Diagnosis present

## 2019-10-31 DIAGNOSIS — Z91013 Allergy to seafood: Secondary | ICD-10-CM

## 2019-10-31 DIAGNOSIS — Z91041 Radiographic dye allergy status: Secondary | ICD-10-CM

## 2019-10-31 DIAGNOSIS — E871 Hypo-osmolality and hyponatremia: Secondary | ICD-10-CM | POA: Diagnosis not present

## 2019-10-31 DIAGNOSIS — E042 Nontoxic multinodular goiter: Secondary | ICD-10-CM | POA: Diagnosis present

## 2019-10-31 DIAGNOSIS — K219 Gastro-esophageal reflux disease without esophagitis: Secondary | ICD-10-CM | POA: Diagnosis present

## 2019-10-31 DIAGNOSIS — K7010 Alcoholic hepatitis without ascites: Secondary | ICD-10-CM | POA: Diagnosis not present

## 2019-10-31 DIAGNOSIS — N390 Urinary tract infection, site not specified: Secondary | ICD-10-CM | POA: Diagnosis not present

## 2019-10-31 DIAGNOSIS — W19XXXA Unspecified fall, initial encounter: Secondary | ICD-10-CM | POA: Diagnosis present

## 2019-10-31 DIAGNOSIS — K5730 Diverticulosis of large intestine without perforation or abscess without bleeding: Secondary | ICD-10-CM | POA: Diagnosis not present

## 2019-10-31 DIAGNOSIS — G9341 Metabolic encephalopathy: Secondary | ICD-10-CM | POA: Diagnosis not present

## 2019-10-31 DIAGNOSIS — Z515 Encounter for palliative care: Secondary | ICD-10-CM

## 2019-10-31 DIAGNOSIS — K449 Diaphragmatic hernia without obstruction or gangrene: Secondary | ICD-10-CM | POA: Diagnosis present

## 2019-10-31 DIAGNOSIS — Z66 Do not resuscitate: Secondary | ICD-10-CM | POA: Diagnosis not present

## 2019-10-31 DIAGNOSIS — R413 Other amnesia: Secondary | ICD-10-CM | POA: Diagnosis present

## 2019-10-31 DIAGNOSIS — R262 Difficulty in walking, not elsewhere classified: Secondary | ICD-10-CM | POA: Diagnosis present

## 2019-10-31 DIAGNOSIS — K7290 Hepatic failure, unspecified without coma: Secondary | ICD-10-CM | POA: Diagnosis not present

## 2019-10-31 DIAGNOSIS — K831 Obstruction of bile duct: Secondary | ICD-10-CM | POA: Diagnosis present

## 2019-10-31 DIAGNOSIS — E860 Dehydration: Secondary | ICD-10-CM | POA: Diagnosis present

## 2019-10-31 DIAGNOSIS — Z79899 Other long term (current) drug therapy: Secondary | ICD-10-CM

## 2019-10-31 DIAGNOSIS — B9620 Unspecified Escherichia coli [E. coli] as the cause of diseases classified elsewhere: Secondary | ICD-10-CM | POA: Diagnosis present

## 2019-10-31 DIAGNOSIS — J90 Pleural effusion, not elsewhere classified: Secondary | ICD-10-CM | POA: Diagnosis not present

## 2019-10-31 DIAGNOSIS — R188 Other ascites: Secondary | ICD-10-CM | POA: Diagnosis not present

## 2019-10-31 DIAGNOSIS — Z8249 Family history of ischemic heart disease and other diseases of the circulatory system: Secondary | ICD-10-CM

## 2019-10-31 DIAGNOSIS — E7220 Disorder of urea cycle metabolism, unspecified: Secondary | ICD-10-CM | POA: Diagnosis present

## 2019-10-31 DIAGNOSIS — G8929 Other chronic pain: Secondary | ICD-10-CM | POA: Diagnosis present

## 2019-10-31 DIAGNOSIS — F209 Schizophrenia, unspecified: Secondary | ICD-10-CM | POA: Diagnosis present

## 2019-10-31 DIAGNOSIS — R200 Anesthesia of skin: Secondary | ICD-10-CM | POA: Diagnosis present

## 2019-10-31 DIAGNOSIS — R2681 Unsteadiness on feet: Secondary | ICD-10-CM | POA: Diagnosis not present

## 2019-10-31 DIAGNOSIS — E7229 Other disorders of urea cycle metabolism: Secondary | ICD-10-CM | POA: Diagnosis not present

## 2019-10-31 DIAGNOSIS — G4089 Other seizures: Secondary | ICD-10-CM | POA: Diagnosis not present

## 2019-10-31 DIAGNOSIS — D696 Thrombocytopenia, unspecified: Secondary | ICD-10-CM | POA: Diagnosis not present

## 2019-10-31 DIAGNOSIS — R17 Unspecified jaundice: Secondary | ICD-10-CM | POA: Diagnosis not present

## 2019-10-31 DIAGNOSIS — R10816 Epigastric abdominal tenderness: Secondary | ICD-10-CM | POA: Diagnosis present

## 2019-10-31 DIAGNOSIS — Z905 Acquired absence of kidney: Secondary | ICD-10-CM | POA: Diagnosis not present

## 2019-10-31 DIAGNOSIS — M79605 Pain in left leg: Secondary | ICD-10-CM | POA: Diagnosis present

## 2019-10-31 DIAGNOSIS — Z7189 Other specified counseling: Secondary | ICD-10-CM

## 2019-10-31 DIAGNOSIS — R600 Localized edema: Secondary | ICD-10-CM

## 2019-10-31 DIAGNOSIS — Z87891 Personal history of nicotine dependence: Secondary | ICD-10-CM | POA: Diagnosis not present

## 2019-10-31 DIAGNOSIS — I10 Essential (primary) hypertension: Secondary | ICD-10-CM | POA: Diagnosis not present

## 2019-10-31 DIAGNOSIS — Z823 Family history of stroke: Secondary | ICD-10-CM

## 2019-10-31 DIAGNOSIS — Z85528 Personal history of other malignant neoplasm of kidney: Secondary | ICD-10-CM

## 2019-10-31 DIAGNOSIS — K7682 Hepatic encephalopathy: Secondary | ICD-10-CM | POA: Diagnosis present

## 2019-10-31 DIAGNOSIS — M1990 Unspecified osteoarthritis, unspecified site: Secondary | ICD-10-CM | POA: Diagnosis not present

## 2019-10-31 DIAGNOSIS — M24111 Other articular cartilage disorders, right shoulder: Secondary | ICD-10-CM | POA: Diagnosis not present

## 2019-10-31 DIAGNOSIS — R197 Diarrhea, unspecified: Secondary | ICD-10-CM | POA: Diagnosis not present

## 2019-10-31 DIAGNOSIS — K7200 Acute and subacute hepatic failure without coma: Secondary | ICD-10-CM | POA: Diagnosis not present

## 2019-10-31 DIAGNOSIS — R41841 Cognitive communication deficit: Secondary | ICD-10-CM | POA: Diagnosis not present

## 2019-10-31 DIAGNOSIS — M6389 Disorders of muscle in diseases classified elsewhere, multiple sites: Secondary | ICD-10-CM | POA: Diagnosis not present

## 2019-10-31 DIAGNOSIS — D638 Anemia in other chronic diseases classified elsewhere: Secondary | ICD-10-CM | POA: Diagnosis present

## 2019-10-31 DIAGNOSIS — N179 Acute kidney failure, unspecified: Secondary | ICD-10-CM | POA: Diagnosis not present

## 2019-10-31 DIAGNOSIS — I959 Hypotension, unspecified: Secondary | ICD-10-CM | POA: Diagnosis present

## 2019-10-31 DIAGNOSIS — M6281 Muscle weakness (generalized): Secondary | ICD-10-CM | POA: Diagnosis not present

## 2019-10-31 LAB — CBC WITH DIFFERENTIAL/PLATELET
Abs Immature Granulocytes: 0.03 10*3/uL (ref 0.00–0.07)
Basophils Absolute: 0 10*3/uL (ref 0.0–0.1)
Basophils Relative: 0 %
Eosinophils Absolute: 0.1 10*3/uL (ref 0.0–0.5)
Eosinophils Relative: 1 %
HCT: 29.8 % — ABNORMAL LOW (ref 36.0–46.0)
Hemoglobin: 10 g/dL — ABNORMAL LOW (ref 12.0–15.0)
Immature Granulocytes: 0 %
Lymphocytes Relative: 7 %
Lymphs Abs: 0.5 10*3/uL — ABNORMAL LOW (ref 0.7–4.0)
MCH: 33 pg (ref 26.0–34.0)
MCHC: 33.6 g/dL (ref 30.0–36.0)
MCV: 98.3 fL (ref 80.0–100.0)
Monocytes Absolute: 0.7 10*3/uL (ref 0.1–1.0)
Monocytes Relative: 11 %
Neutro Abs: 5.4 10*3/uL (ref 1.7–7.7)
Neutrophils Relative %: 81 %
Platelets: 142 10*3/uL — ABNORMAL LOW (ref 150–400)
RBC: 3.03 MIL/uL — ABNORMAL LOW (ref 3.87–5.11)
RDW: 16 % — ABNORMAL HIGH (ref 11.5–15.5)
WBC: 6.7 10*3/uL (ref 4.0–10.5)
nRBC: 0 % (ref 0.0–0.2)

## 2019-10-31 LAB — COMPREHENSIVE METABOLIC PANEL
ALT: 96 U/L — ABNORMAL HIGH (ref 0–44)
AST: 103 U/L — ABNORMAL HIGH (ref 15–41)
Albumin: 1.8 g/dL — ABNORMAL LOW (ref 3.5–5.0)
Alkaline Phosphatase: 488 U/L — ABNORMAL HIGH (ref 38–126)
Anion gap: 13 (ref 5–15)
BUN: 30 mg/dL — ABNORMAL HIGH (ref 8–23)
CO2: 21 mmol/L — ABNORMAL LOW (ref 22–32)
Calcium: 8 mg/dL — ABNORMAL LOW (ref 8.9–10.3)
Chloride: 89 mmol/L — ABNORMAL LOW (ref 98–111)
Creatinine, Ser: 1.88 mg/dL — ABNORMAL HIGH (ref 0.44–1.00)
GFR calc Af Amer: 31 mL/min — ABNORMAL LOW (ref >60)
GFR calc non Af Amer: 27 mL/min — ABNORMAL LOW (ref >60)
Glucose, Bld: 81 mg/dL (ref 70–99)
Potassium: 4.1 mmol/L (ref 3.5–5.1)
Sodium: 123 mmol/L — ABNORMAL LOW (ref 135–145)
Total Bilirubin: 9 mg/dL — ABNORMAL HIGH (ref 0.3–1.2)
Total Protein: 5.5 g/dL — ABNORMAL LOW (ref 6.5–8.1)

## 2019-10-31 LAB — AMMONIA: Ammonia: 70 umol/L — ABNORMAL HIGH (ref 9–35)

## 2019-10-31 LAB — PROTIME-INR
INR: 1.6 — ABNORMAL HIGH (ref 0.8–1.2)
Prothrombin Time: 18.2 seconds — ABNORMAL HIGH (ref 11.4–15.2)

## 2019-10-31 LAB — ACETAMINOPHEN LEVEL: Acetaminophen (Tylenol), Serum: <10 ug/mL — ABNORMAL LOW (ref 10–30)

## 2019-10-31 MED ORDER — LORAZEPAM 1 MG PO TABS
1.0000 mg | ORAL_TABLET | Freq: Once | ORAL | Status: AC
  Administered 2019-10-31: 1 mg via ORAL
  Filled 2019-10-31: qty 1

## 2019-10-31 MED ORDER — LACTULOSE 10 GM/15ML PO SOLN
30.0000 g | Freq: Once | ORAL | Status: AC
  Administered 2019-10-31: 30 g via ORAL
  Filled 2019-10-31: qty 60

## 2019-10-31 MED ORDER — SODIUM CHLORIDE 0.9 % IV BOLUS
500.0000 mL | Freq: Once | INTRAVENOUS | Status: AC
  Administered 2019-10-31: 500 mL via INTRAVENOUS

## 2019-10-31 NOTE — ED Provider Notes (Signed)
[facility] EMERGENCY DEPARTMENT Provider Note   CSN: [id number] Arrival date & time: 10/31/19  1605     History Chief Complaint  Patient presents with  . Jaundice    Nicole [name] is a 69 y.o. female with a history as outlined below, most significant for chronic alcoholism, hepatic steatosis, also with atrial fibrillation, GERD, hypertension was seen by her primary doctor Dr. Legrand Rams today for evaluation of lower extremity pain and generalized weakness and was found to be extremely jaundiced so was sent here for further evaluation. She does endorse that she has been drinking "way too much alcohol" lately, her last intake was yesterday. She denies history of dt's.  She denies nausea or vomiting, also no abdominal pain. She does endorse light-colored stool and darker than normal urine. She reports generalized weakness, denies fevers or chills.    The history is provided by the patient.       Past Medical History:  Diagnosis Date  . Anemia   . Arthritis   . Asthma    as a child  . Atrial fibrillation with RVR ([place])   . Cancer ([place])    right kidney  . Chronic alcoholism ([place])   . Chronic left shoulder pain   . Dysrhythmia    Atrial Fib with RVR   . GERD (gastroesophageal reflux disease)   . Heartburn   . Hiatal hernia 12/17/2016   stomach noted in chest on CT scan  . History of blood transfusion   . Hypertension   . Memory loss   . Schizophrenia ([place])   . Seizures ([place])     had when she had period and when she ovalate  . Thyroid nodule     Patient Active Problem List   Diagnosis Date Noted  . Acute respiratory failure with hypoxia ([place])   . Epigastric abdominal tenderness 12/18/2016  . Gastric mass 12/17/2016    Class: Acute  . Right renal mass 12/17/2016    Class: Acute  . Diverticulosis of sigmoid colon 12/17/2016  . Hiatal hernia 12/17/2016    Class: Acute  . Chronic alcoholism ([place]) 12/17/2016    Class: Chronic  . Chronic left shoulder pain 12/17/2016     Class: Chronic  . Arthritis 12/17/2016    Class: Chronic  . Hyponatremia 12/17/2016    Class: Acute  . Hypokalemia 12/17/2016    Class: Acute  . Anemia 12/17/2016  . Atrial fibrillation with rapid ventricular response ([place]) 12/17/2016    Class: Acute    Past Surgical History:  Procedure Laterality Date  . ANKLE FRACTURE SURGERY Right 2008   screws  . ANKLE FRACTURE SURGERY Left 2008   plates  . ESOPHAGOGASTRODUODENOSCOPY (EGD) WITH PROPOFOL N/A 12/18/2016   Procedure: ESOPHAGOGASTRODUODENOSCOPY (EGD) WITH PROPOFOL;  Surgeon: Wilford Corner, MD;  Location: [facility];  Service: Endoscopy;  Laterality: N/A;  . NASAL SINUS SURGERY Left 11/17/2017   Procedure: ENDOSCOPIC SINUS approach;  Surgeon: Melida Quitter, MD;  Location: [facility];  Service: ENT;  Laterality: Left;  . NEPHRECTOMY Right    robotic riight radical   . ORIF ORBITAL FRACTURE Left 11/17/2017   Procedure: Trans Antral Left sided orbital floor fracture repair with dissovable floor implant;  Surgeon: Melida Quitter, MD;  Location: [facility] OR;  Service: ENT;  Laterality: Left;     OB History   No obstetric history on file.     Family History  Problem Relation Age of Onset  . Stroke Mother   . Heart attack Father   .  Ulcers Father   . Diabetes Sister   . Cancer Brother   . Diabetes Sister   . Diabetes Sister     Social History   Tobacco Use  . Smoking status: Former Smoker    Years: 15.00    Types: Cigarettes    Quit date: 11/14/2007    Years since quitting: 11.9  . Smokeless tobacco: Never Used  Vaping Use  . Vaping Use: Never used  Substance Use Topics  . Alcohol use: Not Currently    Comment: none since fall 11/13/17  . Drug use: No    Home Medications Prior to Admission medications   Medication Sig Start Date End Date Taking? Authorizing Provider  Calcium Carbonate-Vitamin D (CALCIUM 500 + D PO) Take 1 tablet by mouth daily.    [provider]  clindamycin (CLEOCIN) 150 MG capsule Take 1  capsule (150 mg total) by mouth every 6 (six) hours. 11/13/17   Nat Christen, MD  ferrous sulfate 325 (65 FE) MG tablet Take 325 mg by mouth 2 (two) times daily with a meal.    [provider]  Lido-Capsaicin-Men-Methyl Sal (MEDI-PATCH-LIDOCAINE EX) Apply topically.    [provider]  Magnesium 250 MG TABS Take 2 tablets by mouth daily.    [provider]  metoprolol tartrate (LOPRESSOR) 50 MG tablet Take 50 mg by mouth 2 (two) times daily.    [provider]  Multiple Vitamins-Minerals (MULTIVITAMIN WITH MINERALS) tablet Take 1 tablet by mouth daily.    [provider]  potassium gluconate (HM POTASSIUM) 595 (99 K) MG TABS tablet Take 595 mg by mouth daily.    [provider]  Propylene Glycol (SYSTANE COMPLETE) 0.6 % SOLN Place 1 drop into both eyes See admin instructions. Instill one drop into both eyes every morning, may also use later in the day as needed for dry eyes    [provider]    Allergies    Other, Shellfish allergy, and Iodinated diagnostic agents  Review of Systems   Review of Systems  Constitutional: Positive for fatigue. Negative for chills and fever.  HENT: Negative for congestion.   Eyes: Negative.   Respiratory: Negative for chest tightness and shortness of breath.   Cardiovascular: Negative for chest pain.  Gastrointestinal: Negative for abdominal pain, diarrhea, nausea and vomiting.  Genitourinary: Negative.  Negative for dysuria.  Musculoskeletal: Positive for myalgias. Negative for arthralgias, joint swelling and neck pain.  Skin: Positive for color change. Negative for rash and wound.  Neurological: Positive for weakness. Negative for dizziness, light-headedness, numbness and headaches.  Psychiatric/Behavioral: Negative.     Physical Exam Updated Vital Signs BP 102/61   Pulse 64   Temp 97.8 F (36.6 C) (Oral)   Resp 17   Ht 5' 8"  (1.727 m)   SpO2 100%   BMI 29.04 kg/m   Physical  Exam Vitals and nursing note reviewed.  Constitutional:      Appearance: She is well-developed.  HENT:     Head: Normocephalic and atraumatic.  Eyes:     General: Scleral icterus present.     Conjunctiva/sclera: Conjunctivae normal.  Cardiovascular:     Rate and Rhythm: Normal rate and regular rhythm.     Heart sounds: Normal heart sounds.  Pulmonary:     Effort: Pulmonary effort is normal.     Breath sounds: Normal breath sounds. No wheezing.  Abdominal:     General: Bowel sounds are normal.     Palpations: Abdomen is soft. There  is hepatomegaly.     Tenderness: There is no abdominal tenderness. There is no guarding or rebound.     Comments:    Musculoskeletal:        General: Normal range of motion.     Cervical back: Normal range of motion.  Skin:    General: Skin is warm and dry.     Coloration: Skin is jaundiced.     Findings: Bruising present.     Comments: Several bruises upper arms, abrasions right face.   Neurological:     Mental Status: She is alert and oriented to person, place, and time.     Cranial Nerves: No cranial nerve deficit.     Sensory: Sensation is intact.     Comments: No asterixis     ED Results / Procedures / Treatments   Labs (all labs ordered are listed, but only abnormal results are displayed) Labs Reviewed  AMMONIA - Abnormal; Notable for the following components:      Result Value   Ammonia 70 (*)    All other components within normal limits  COMPREHENSIVE METABOLIC PANEL - Abnormal; Notable for the following components:   Sodium 123 (*)    Chloride 89 (*)    CO2 21 (*)    BUN 30 (*)    Creatinine, Ser 1.88 (*)    Calcium 8.0 (*)    Total Protein 5.5 (*)    Albumin 1.8 (*)    AST 103 (*)    ALT 96 (*)    Alkaline Phosphatase 488 (*)    Total Bilirubin 9.0 (*)    GFR calc non Af Amer 27 (*)    GFR calc Af Amer 31 (*)    All other components within normal limits  CBC WITH DIFFERENTIAL/PLATELET - Abnormal; Notable for the  following components:   RBC 3.03 (*)    Hemoglobin 10.0 (*)    HCT 29.8 (*)    RDW 16.0 (*)    Platelets 142 (*)    Lymphs Abs 0.5 (*)    All other components within normal limits  ACETAMINOPHEN LEVEL - Abnormal; Notable for the following components:   Acetaminophen (Tylenol), Serum <10 (*)    All other components within normal limits  HEPATITIS PANEL, ACUTE    EKG None  Radiology CT ABDOMEN PELVIS WO CONTRAST  Result Date: 10/31/2019 CLINICAL DATA:  Weakness, diarrhea, jaundice, diffuse abdominal pain EXAM: CT ABDOMEN AND PELVIS WITHOUT CONTRAST TECHNIQUE: Multidetector CT imaging of the abdomen and pelvis was performed following the standard protocol without IV contrast. COMPARISON:  12/24/2016, 08/05/2019 FINDINGS: Lower chest: There is a large hiatal hernia containing the majority of the stomach. Trace left pleural effusion with compressive atelectasis in the left lower lobe. The right lung base is clear. Hepatobiliary: There is severe hepatic steatosis again noted, unchanged. No focal liver abnormality. Gallbladder is unremarkable. No biliary dilation. Pancreas: Pancreas is atrophic without focal abnormality. The complex fluid collection between the pancreatic tail and the left lobe liver on prior study has resolved in the interim. Spleen: Normal in size without focal abnormality. Adrenals/Urinary Tract: The right kidney and adrenal gland are surgically absent. Left kidney and adrenal are unremarkable. No urinary tract calculi or obstruction. Bladder is decompressed without focal abnormality. Stomach/Bowel: There is diffuse colonic diverticulosis without diverticulitis. No bowel obstruction or ileus. Normal appendix right lower quadrant. Vascular/Lymphatic: Aortic atherosclerosis. No enlarged abdominal or pelvic lymph nodes. Reproductive: Uterus and bilateral adnexa are unremarkable. Other: Trace simple appearing free fluid in  the bilateral upper quadrants and lower pelvis. No free  intraperitoneal gas. No abdominal wall hernia. Musculoskeletal: No acute or destructive bony lesions. Reconstructed images demonstrate no additional findings. IMPRESSION: 1. Interval resolution of the complex fluid collection between the pancreatic tail and left lobe liver. 2. Stable severe hepatic steatosis. 3. Large hiatal hernia. 4. Trace simple appearing free fluid in the bilateral upper quadrants and lower pelvis. 5. Diffuse colonic diverticulosis without diverticulitis. 6. Aortic Atherosclerosis (ICD10-I70.0). Electronically Signed   By: Randa Ngo M.D.   On: 10/31/2019 22:22    Procedures Procedures (including critical care time)   CRITICAL CARE Performed by: Evalee Jefferson Total critical care time: 35 minutes Critical care time was exclusive of separately billable procedures and treating other patients. Critical care was necessary to treat or prevent imminent or life-threatening deterioration. Critical care was time spent personally by me on the following activities: development of treatment plan with patient and/or surrogate as well as nursing, discussions with consultants, evaluation of patient's response to treatment, examination of patient, obtaining history from patient or surrogate, ordering and performing treatments and interventions, ordering and review of laboratory studies, ordering and review of radiographic studies, pulse oximetry and re-evaluation of patient's condition.   Medications Ordered in ED Medications  LORazepam (ATIVAN) tablet 1 mg (has no administration in time range)  lactulose (CHRONULAC) 10 GM/15ML solution 30 g (has no administration in time range)  sodium chloride 0.9 % bolus 500 mL (0 mLs Intravenous Stopped 10/31/19 2251)    ED Course  I have reviewed the triage vital signs and the nursing notes.  Pertinent labs & imaging results that were available during my care of the patient were reviewed by me and considered in my medical decision making (see chart  for details).    MDM Rules/Calculators/A&P                          Patient with a history of alcohol abuse and known hepatic steatosis presenting with profound jaundice.  Labs are concerning for a bilirubin of 9.0.  Her AST is 103, ALT 96, alk phos is very elevated at 488.  She also has dehydration with a BUN of 30 and a elevated creatinine of 1.88, hyponatremia at 123.  Her acetaminophen level is less than 10.  She does have an elevated ammonia at 70.  She does endorse some memory issues recently, denies confusion.   Discussed with Dr. Jannette Spanner who accepts pt for admission.   Final Clinical Impression(s) / ED Diagnoses Final diagnoses:  Acute liver failure without hepatic coma  Increased ammonia level  Hyponatremia  AKI (acute kidney injury) [facility]    Rx / DC Orders ED Discharge Orders    None       Landis Martins 10/31/19 2338    Noemi Chapel, MD 11/08/19 [phone]

## 2019-10-31 NOTE — H&P (Signed)
TRH H&P    Patient Demographics:    Nicole [name], is a 69 y.o. female  MRN: [id number]  DOB - [birth year]  Admit Date - 10/31/2019  Referring MD/NP/PA: Alfonzo Feller  Outpatient Primary MD for the patient is Rosita Fire, MD  Patient coming from: Home  Chief complaint- jaundice   HPI:    Nicole [name]  is a 69 y.o. female, with history of thyroid nodule, seizures, schizophrenia, HTN, GERD, and Afib who presents to the ED at the recommendation of her PCP. Patient reports that she made an appt with PCP for chronic leg pain, and she was told to come to the ED for jaundice. Patient reports that she had not noticed any change in color at home. Patient does report that she is an alcoholic. She reports that she drinks too much too often. She quantifies that as several "glasses" or liquor every day. She reports that when she is "coming off of alcohol" she hallucinates. She is not able to report what her hallucinations are. Of note - she had been given ativan in the ED and is very drowsy at the time of my exam. She also reports that she has not been eating at home. This is because she doesn't want to have to "clean up" after a bowel movement.  Patient reports no known history of liver failure or hepatitis. She reports that she lives with her ex husband and her son. She is supposed to ambulate with a walker, but she has not been lately.   As for her leg pain - it has been present for at least one month. Patient reports that it feels like "compression." Walking makes the pain worse. Nothing makes it better. Patient reports that the pain is in her calves and radiates up to her thighs. Patient reports that her left leg hurts more than the right. She reports that this is consistent with her other pains, that are always worse on the left than on the right. She also reports that the left leg is more edematous than the right normally because  she has had surgery on the right leg.   Wounds on right face - patient reports a fall 3-4 days ago. Patient reports that she was under the influence of EtOH at the time.   In the ED T 97.8, HR 64, R 17, BP 102/61 WBC 6.7 Na 123 Cr 1.88 tyl <10 Hep panel pending CT abdomen - Interval resolution of complex fluid collection between pancreatic tail and left lobe liver. Severe hepatic steatosis. Trace free fluid in BL upper quadrants and lower pelvis. Diverticulosis without diverticulitis    Review of systems:    In addition to the HPI above,  Review of Systems  Constitutional: Positive for malaise/fatigue. Negative for chills and fever.  HENT: Negative for congestion, sinus pain, sore throat and tinnitus.   Eyes: Negative for blurred vision and double vision.  Respiratory: Negative for cough, shortness of breath and wheezing.   Cardiovascular: Positive for leg swelling. Negative for chest pain and palpitations.  Gastrointestinal: Negative for abdominal  pain, constipation, diarrhea, nausea and vomiting.  Genitourinary: Negative for dysuria, frequency and urgency.  Musculoskeletal: Negative for myalgias.  Skin: Negative for rash.  Neurological: Negative for seizures and weakness.  Psychiatric/Behavioral: Substance abuse: alcohol abuse.     All other systems reviewed and are negative.    Past History of the following :    Past Medical History:  Diagnosis Date  . Anemia   . Arthritis   . Asthma    as a child  . Atrial fibrillation with RVR ([place])   . Cancer ([place])    right kidney  . Chronic alcoholism ([place])   . Chronic left shoulder pain   . Dysrhythmia    Atrial Fib with RVR   . GERD (gastroesophageal reflux disease)   . Heartburn   . Hiatal hernia 12/17/2016   stomach noted in chest on CT scan  . History of blood transfusion   . Hypertension   . Memory loss   . Schizophrenia ([place])   . Seizures ([place])     had when she had period and when she ovalate  . Thyroid  nodule       Past Surgical History:  Procedure Laterality Date  . ANKLE FRACTURE SURGERY Right 2008   screws  . ANKLE FRACTURE SURGERY Left 2008   plates  . ESOPHAGOGASTRODUODENOSCOPY (EGD) WITH PROPOFOL N/A 12/18/2016   Procedure: ESOPHAGOGASTRODUODENOSCOPY (EGD) WITH PROPOFOL;  Surgeon: Wilford Corner, MD;  Location: [facility];  Service: Endoscopy;  Laterality: N/A;  . NASAL SINUS SURGERY Left 11/17/2017   Procedure: ENDOSCOPIC SINUS approach;  Surgeon: Melida Quitter, MD;  Location: [facility];  Service: ENT;  Laterality: Left;  . NEPHRECTOMY Right    robotic riight radical   . ORIF ORBITAL FRACTURE Left 11/17/2017   Procedure: Trans Antral Left sided orbital floor fracture repair with dissovable floor implant;  Surgeon: Melida Quitter, MD;  Location: [facility] OR;  Service: ENT;  Laterality: Left;      Social History:      Social History   Tobacco Use  . Smoking status: Former Smoker    Years: 15.00    Types: Cigarettes    Quit date: 11/14/2007    Years since quitting: 11.9  . Smokeless tobacco: Never Used  Substance Use Topics  . Alcohol use: Not Currently    Comment: none since fall 11/13/17       Family History :     Family History  Problem Relation Age of Onset  . Stroke Mother   . Heart attack Father   . Ulcers Father   . Diabetes Sister   . Cancer Brother   . Diabetes Sister   . Diabetes Sister       Home Medications:   Prior to Admission medications   Medication Sig Start Date End Date Taking? Authorizing Provider  Calcium Carbonate-Vitamin D (CALCIUM 500 + D PO) Take 1 tablet by mouth daily.    [provider]  clindamycin (CLEOCIN) 150 MG capsule Take 1 capsule (150 mg total) by mouth every 6 (six) hours. 11/13/17   Nat Christen, MD  ferrous sulfate 325 (65 FE) MG tablet Take 325 mg by mouth 2 (two) times daily with a meal.    [provider]  Lido-Capsaicin-Men-Methyl Sal (MEDI-PATCH-LIDOCAINE EX) Apply topically.    [provider]  Magnesium 250 MG TABS Take 2 tablets by mouth daily.    [provider]  metoprolol tartrate (LOPRESSOR) 50 MG tablet Take 50 mg by mouth 2 (two) times  daily.    [provider]  Multiple Vitamins-Minerals (MULTIVITAMIN WITH MINERALS) tablet Take 1 tablet by mouth daily.    [provider]  potassium gluconate (HM POTASSIUM) 595 (99 K) MG TABS tablet Take 595 mg by mouth daily.    [provider]  Propylene Glycol (SYSTANE COMPLETE) 0.6 % SOLN Place 1 drop into both eyes See admin instructions. Instill one drop into both eyes every morning, may also use later in the day as needed for dry eyes    [provider]     Allergies:     Allergies  Allergen Reactions  . Other Shortness Of Breath and Anxiety    UNSPECIFIED REACTION to "fast foods and processed food"  . Shellfish Allergy Shortness Of Breath  . Iodinated Diagnostic Agents Swelling    Hand swelling from contrast dye for MRI     Physical Exam:   Vitals  Blood pressure 102/64, pulse 67, temperature 97.8 F (36.6 C), temperature source Oral, resp. rate 15, height _0  (1.727 m), SpO2 100 %.  1.  General: Laying supine in bed, no acute distress, visibly jaundiced from the door  2. Psychiatric: Loses focus easily Cooperative, pleasant  3. Neurologic: Moves all 4 extremities voluntarily Cranial nerves II-XII grossly intact  4. HEENMT:  Head with right sided wounds at different stages of healing  Mucous membranes dry Trachea midline Scleral icterus Neck is supple  5. Respiratory : LCTABL, diminished breath sounds in the lower lung fields  6. Cardiovascular  Heart rate normal, rhythm regular No murmur No gallops 2 + pitting edema in the left lower extremity  7. Gastrointestinal:  Abdomen is soft, non distended, tender to the RUQ  8. Skin:  Diffusely jaundiced  9.Musculoskeletal:  Peripheral edema    Data Review:    CBC Recent Labs  Lab 10/31/19 1636    WBC 6.7  HGB 10.0*  HCT 29.8*  PLT 142*  MCV 98.3  MCH 33.0  MCHC 33.6  RDW 16.0*  LYMPHSABS 0.5*  MONOABS 0.7  EOSABS 0.1  BASOSABS 0.0   ------------------------------------------------------------------------------------------------------------------  Results for orders placed or performed during the hospital encounter of 10/31/19 (from the past 48 hour(s))  Ammonia     Status: Abnormal   Collection Time: 10/31/19  4:36 PM  Result Value Ref Range   Ammonia 70 (H) 9 - 35 umol/L    Comment: Performed at [facility], [address], [place], [place] [address]  Comprehensive metabolic panel     Status: Abnormal   Collection Time: 10/31/19  4:36 PM  Result Value Ref Range   Sodium 123 (L) 135 - 145 mmol/L   Potassium 4.1 3.5 - 5.1 mmol/L   Chloride 89 (L) 98 - 111 mmol/L   CO2 21 (L) 22 - 32 mmol/L   Glucose, Bld 81 70 - 99 mg/dL    Comment: Glucose reference range applies only to samples taken after fasting for at least 8 hours.   BUN 30 (H) 8 - 23 mg/dL   Creatinine, Ser 1.88 (H) 0.44 - 1.00 mg/dL   Calcium 8.0 (L) 8.9 - 10.3 mg/dL   Total Protein 5.5 (L) 6.5 - 8.1 g/dL   Albumin 1.8 (L) 3.5 - 5.0 g/dL   AST 103 (H) 15 - 41 U/L   ALT 96 (H) 0 - 44 U/L   Alkaline Phosphatase 488 (H) 38 - 126 U/L   Total Bilirubin 9.0 (H) 0.3 - 1.2 mg/dL   GFR calc non Af Amer 27 (  L) >60 mL/min   GFR calc Af Amer 31 (L) >60 mL/min   Anion gap 13 5 - 15    Comment: Performed at [facility], [address] [place], [place] [address]  CBC with Differential     Status: Abnormal   Collection Time: 10/31/19  4:36 PM  Result Value Ref Range   WBC 6.7 4.0 - 10.5 K/uL   RBC 3.03 (L) 3.87 - 5.11 MIL/uL   Hemoglobin 10.0 (L) 12.0 - 15.0 g/dL   HCT 29.8 (L) 36 - 46 %   MCV 98.3 80.0 - 100.0 fL   MCH 33.0 26.0 - 34.0 pg   MCHC 33.6 30.0 - 36.0 g/dL   RDW 16.0 (H) 11.5 - 15.5 %   Platelets 142 (L) 150 - 400 K/uL   nRBC 0.0 0.0 - 0.2 %   Neutrophils Relative % 81 %   Neutro Abs 5.4 1.7 -  7.7 K/uL   Lymphocytes Relative 7 %   Lymphs Abs 0.5 (L) 0.7 - 4.0 K/uL   Monocytes Relative 11 %   Monocytes Absolute 0.7 0 - 1 K/uL   Eosinophils Relative 1 %   Eosinophils Absolute 0.1 0 - 0 K/uL   Basophils Relative 0 %   Basophils Absolute 0.0 0 - 0 K/uL   Immature Granulocytes 0 %   Abs Immature Granulocytes 0.03 0.00 - 0.07 K/uL    Comment: Performed at [facility], [address], [place], [place] [address]  Acetaminophen level     Status: Abnormal   Collection Time: 10/31/19  4:36 PM  Result Value Ref Range   Acetaminophen (Tylenol), Serum <10 (L) 10 - 30 ug/mL    Comment: (NOTE) Therapeutic concentrations vary significantly. A range of 10-30 ug/mL  may be an effective concentration for many patients. However, some  are best treated at concentrations outside of this range. Acetaminophen concentrations >150 ug/mL at 4 hours after ingestion  and >50 ug/mL at 12 hours after ingestion are often associated with  toxic reactions.  Performed at [facility], [address] [place], [place] [address]   Protime-INR     Status: Abnormal   Collection Time: 10/31/19  4:36 PM  Result Value Ref Range   Prothrombin Time 18.2 (H) 11.4 - 15.2 seconds   INR 1.6 (H) 0.8 - 1.2    Comment: (NOTE) INR goal varies based on device and disease states. Performed at [facility], [address], [place], [place] [address]     Chemistries  Recent Labs  Lab 10/31/19 1636  NA 123*  K 4.1  CL 89*  CO2 21*  GLUCOSE 81  BUN 30*  CREATININE 1.88*  CALCIUM 8.0*  AST 103*  ALT 96*  ALKPHOS 488*  BILITOT 9.0*   ------------------------------------------------------------------------------------------------------------------  ------------------------------------------------------------------------------------------------------------------ GFR: CrCl cannot be calculated (Unknown ideal weight.). Liver Function Tests: Recent Labs  Lab 10/31/19 1636  AST 103*  ALT 96*  ALKPHOS 488*   BILITOT 9.0*  PROT 5.5*  ALBUMIN 1.8*   No results for input(s): LIPASE, AMYLASE in the last 168 hours. Recent Labs  Lab 10/31/19 1636  AMMONIA 70*   Coagulation Profile: Recent Labs  Lab 10/31/19 1636  INR 1.6*   Cardiac Enzymes: No results for input(s): CKTOTAL, CKMB, CKMBINDEX, TROPONINI in the last 168 hours. BNP (last 3 results) No results for input(s): PROBNP in the last 8760 hours. HbA1C: No results for input(s): HGBA1C in the last 72 hours. CBG: No results for input(s): GLUCAP in the last 168 hours.  Lipid Profile: No results for input(s): CHOL, HDL, LDLCALC, TRIG, CHOLHDL, LDLDIRECT in the last 72 hours. Thyroid Function Tests: No results for input(s): TSH, T4TOTAL, FREET4, T3FREE, THYROIDAB in the last 72 hours. Anemia Panel: No results for input(s): VITAMINB12, FOLATE, FERRITIN, TIBC, IRON, RETICCTPCT in the last 72 hours.  --------------------------------------------------------------------------------------------------------------- Urine analysis:    Component Value Date/Time   COLORURINE YELLOW 12/17/2016 0200   APPEARANCEUR CLEAR 12/17/2016 0200   LABSPEC 1.020 12/17/2016 0200   PHURINE 6.0 12/17/2016 0200   GLUCOSEU NEGATIVE 12/17/2016 0200   HGBUR SMALL (A) 12/17/2016 0200   BILIRUBINUR NEGATIVE 12/17/2016 0200   KETONESUR 80 (A) 12/17/2016 0200   PROTEINUR 30 (A) 12/17/2016 0200   NITRITE NEGATIVE 12/17/2016 0200   LEUKOCYTESUR NEGATIVE 12/17/2016 0200      Imaging Results:    CT ABDOMEN PELVIS WO CONTRAST  Result Date: 10/31/2019 CLINICAL DATA:  Weakness, diarrhea, jaundice, diffuse abdominal pain EXAM: CT ABDOMEN AND PELVIS WITHOUT CONTRAST TECHNIQUE: Multidetector CT imaging of the abdomen and pelvis was performed following the standard protocol without IV contrast. COMPARISON:  12/24/2016, 08/05/2019 FINDINGS: Lower chest: There is a large hiatal hernia containing the majority of the stomach. Trace left pleural effusion with compressive  atelectasis in the left lower lobe. The right lung base is clear. Hepatobiliary: There is severe hepatic steatosis again noted, unchanged. No focal liver abnormality. Gallbladder is unremarkable. No biliary dilation. Pancreas: Pancreas is atrophic without focal abnormality. The complex fluid collection between the pancreatic tail and the left lobe liver on prior study has resolved in the interim. Spleen: Normal in size without focal abnormality. Adrenals/Urinary Tract: The right kidney and adrenal gland are surgically absent. Left kidney and adrenal are unremarkable. No urinary tract calculi or obstruction. Bladder is decompressed without focal abnormality. Stomach/Bowel: There is diffuse colonic diverticulosis without diverticulitis. No bowel obstruction or ileus. Normal appendix right lower quadrant. Vascular/Lymphatic: Aortic atherosclerosis. No enlarged abdominal or pelvic lymph nodes. Reproductive: Uterus and bilateral adnexa are unremarkable. Other: Trace simple appearing free fluid in the bilateral upper quadrants and lower pelvis. No free intraperitoneal gas. No abdominal wall hernia. Musculoskeletal: No acute or destructive bony lesions. Reconstructed images demonstrate no additional findings. IMPRESSION: 1. Interval resolution of the complex fluid collection between the pancreatic tail and left lobe liver. 2. Stable severe hepatic steatosis. 3. Large hiatal hernia. 4. Trace simple appearing free fluid in the bilateral upper quadrants and lower pelvis. 5. Diffuse colonic diverticulosis without diverticulitis. 6. Aortic Atherosclerosis (ICD10-I70.0). Electronically Signed   By: Randa Ngo M.D.   On: 10/31/2019 22:22    My personal review of EKG: Rhythm NSR, Rate 64 /min, QTc 466 ,no Acute ST changes   Assessment & Plan:    Active Problems:   Acute liver failure   1. Acute liver failure 1. AST 103, ALT 96, Ammonia 70, Alk phos 488, Bili 9.0 2. Lactulose in ED 3. CT abd = hepatic  steatosis 4. Hep panel pending 5. Known history of EtOH abuse 6. Trend LFTs in the AM 2. EtOH abuse 1. CIWA protocol 2. Counseled on the importance of cessation 3. Hyponatremia 1. Na 123 2. 2/2 poor PO intake 3. Trend Na 4. 556m bolus in ED 5. Continue IV NS 6. Continue to monitor 4. Hypotension 1. Likely 2/2 to dehydration  2. Improved with 5075mbolus from 83/50 to 100/60 3. Continue IV fluids 4. Continue to monitor 5. AKI 1. Cr baseline 1.3 2. Today cr 1.88 3. 2/2 dehydration? 4. 2/2 hepatorenal syndrome 5. Likely  multifactorial 6. Continue IV fluids 7. Continue liver failure workup as above 8. Continue to monitor 6. Leg pain 1. Calves to thighs 2. [place] lower extremities to r/o DVT 3. Continue to monitor   DVT Prophylaxis-   heparin - SCDs   AM Labs Ordered, also please review Full Orders  Family Communication: No family at bedside  Code Status:  Full  Admission status: Inpatient :The appropriate admission status for this patient is INPATIENT. Inpatient status is judged to be reasonable and necessary in order to provide the required intensity of service to ensure the patient's safety. The patient's presenting symptoms, physical exam findings, and initial radiographic and laboratory data in the context of their chronic comorbidities is felt to place them at high risk for further clinical deterioration. Furthermore, it is not anticipated that the patient will be medically stable for discharge from the hospital within 2 midnights of admission. The following factors support the admission status of inpatient.     The patient's presenting symptoms include Jaundice The worrisome physical exam findings include jaundice, elevated LFTs The initial radiographic and laboratory data are worrisome because of Elevated bili, alk phos, transaminases The chronic co-morbidities include thyroid nodule, seizures, schizophrenia, HTN, GERD, afib       * I certify that at the point of  admission it is my clinical judgment that the patient will require inpatient hospital care spanning beyond 2 midnights from the point of admission due to high intensity of service, high risk for further deterioration and high frequency of surveillance required.*  Time spent in minutes : [place]

## 2019-10-31 NOTE — ED Notes (Signed)
Pt in CT at this time.

## 2019-10-31 NOTE — ED Triage Notes (Signed)
Pt noted with jaundice at doctor's office.  Pt with generalized weakness and has had some diarrhea.  Pt alert and oriented to year and place but not month.  C/o pain all over-back and legs.

## 2019-10-31 NOTE — ED Provider Notes (Signed)
This patient is a very ill-appearing 69 year old female, she is a self-admitted chronic alcoholic who has been drinking too much lately. She presented to her doctor's office due to some severe weakness and some ongoing pain that she has had in her legs and her muscles diffusely. She was found to be jaundiced clinically and sent to the hospital for further evaluation.  On my exam the patient does have jaundice and scleral icterus, she has moist mucous membranes, she has a palpable liver edge but essentially nontender abdomen. Heart and lung exams are unremarkable, she is borderline hypotensive with blood pressures in the upper 80s and lower 90s, her heart rate is normal, she is afebrile, her labs are metabolic mass with multiple lab abnormalities including acute kidney injury, acute liver failure, hyperbilirubinemia over 9. She is hyponatremic and severely weak, she will need to come into the hospital. She is critically ill.   EKG Interpretation  Date/Time:  Thursday October 31 2019 23:12:45 EDT Ventricular Rate:  67 PR Interval:    QRS Duration: 87 QT Interval:  441 QTC Calculation: 466 R Axis:   72 Text Interpretation: Sinus rhythm Low voltage, extremity and precordial leads Borderline repolarization abnormality Confirmed by Noemi Chapel [phone]) on 10/31/2019 11:26:10 PM         Medical screening examination/treatment/procedure(s) were conducted as a shared visit with non-physician practitioner(s) and myself.  I personally evaluated the patient during the encounter.  Clinical Impression:   Final diagnoses:  Acute liver failure without hepatic coma  Increased ammonia level  Hyponatremia  AKI (acute kidney injury) ([place])  Acute liver failure         Noemi Chapel, MD 11/08/19 [phone]

## 2019-11-01 ENCOUNTER — Inpatient Hospital Stay (HOSPITAL_COMMUNITY): Payer: Medicare HMO

## 2019-11-01 DIAGNOSIS — K7010 Alcoholic hepatitis without ascites: Secondary | ICD-10-CM

## 2019-11-01 DIAGNOSIS — I959 Hypotension, unspecified: Secondary | ICD-10-CM | POA: Diagnosis present

## 2019-11-01 DIAGNOSIS — E7220 Disorder of urea cycle metabolism, unspecified: Secondary | ICD-10-CM

## 2019-11-01 DIAGNOSIS — K5730 Diverticulosis of large intestine without perforation or abscess without bleeding: Secondary | ICD-10-CM

## 2019-11-01 DIAGNOSIS — F1020 Alcohol dependence, uncomplicated: Secondary | ICD-10-CM

## 2019-11-01 DIAGNOSIS — R17 Unspecified jaundice: Secondary | ICD-10-CM

## 2019-11-01 DIAGNOSIS — K7290 Hepatic failure, unspecified without coma: Secondary | ICD-10-CM

## 2019-11-01 DIAGNOSIS — K7682 Hepatic encephalopathy: Secondary | ICD-10-CM | POA: Diagnosis present

## 2019-11-01 DIAGNOSIS — N179 Acute kidney failure, unspecified: Secondary | ICD-10-CM

## 2019-11-01 LAB — CBC
HCT: 26.8 % — ABNORMAL LOW (ref 36.0–46.0)
Hemoglobin: 9 g/dL — ABNORMAL LOW (ref 12.0–15.0)
MCH: 33.5 pg (ref 26.0–34.0)
MCHC: 33.6 g/dL (ref 30.0–36.0)
MCV: 99.6 fL (ref 80.0–100.0)
Platelets: 121 10*3/uL — ABNORMAL LOW (ref 150–400)
RBC: 2.69 MIL/uL — ABNORMAL LOW (ref 3.87–5.11)
RDW: 16.2 % — ABNORMAL HIGH (ref 11.5–15.5)
WBC: 5.6 10*3/uL (ref 4.0–10.5)
nRBC: 0 % (ref 0.0–0.2)

## 2019-11-01 LAB — TROPONIN I (HIGH SENSITIVITY): Troponin I (High Sensitivity): 19 ng/L — ABNORMAL HIGH (ref <18)

## 2019-11-01 LAB — LIPID PANEL
Cholesterol: 308 mg/dL — ABNORMAL HIGH (ref 0–200)
HDL: <10 mg/dL — ABNORMAL LOW (ref >40)
Triglycerides: 256 mg/dL — ABNORMAL HIGH (ref <150)

## 2019-11-01 LAB — COMPREHENSIVE METABOLIC PANEL
ALT: 85 U/L — ABNORMAL HIGH (ref 0–44)
AST: 96 U/L — ABNORMAL HIGH (ref 15–41)
Albumin: 1.5 g/dL — ABNORMAL LOW (ref 3.5–5.0)
Alkaline Phosphatase: 428 U/L — ABNORMAL HIGH (ref 38–126)
Anion gap: 10 (ref 5–15)
BUN: 30 mg/dL — ABNORMAL HIGH (ref 8–23)
CO2: 20 mmol/L — ABNORMAL LOW (ref 22–32)
Calcium: 7.7 mg/dL — ABNORMAL LOW (ref 8.9–10.3)
Chloride: 93 mmol/L — ABNORMAL LOW (ref 98–111)
Creatinine, Ser: 1.93 mg/dL — ABNORMAL HIGH (ref 0.44–1.00)
GFR calc Af Amer: 30 mL/min — ABNORMAL LOW (ref >60)
GFR calc non Af Amer: 26 mL/min — ABNORMAL LOW (ref >60)
Glucose, Bld: 70 mg/dL (ref 70–99)
Potassium: 3.6 mmol/L (ref 3.5–5.1)
Sodium: 123 mmol/L — ABNORMAL LOW (ref 135–145)
Total Bilirubin: 9 mg/dL — ABNORMAL HIGH (ref 0.3–1.2)
Total Protein: 4.9 g/dL — ABNORMAL LOW (ref 6.5–8.1)

## 2019-11-01 LAB — HEPATITIS PANEL, ACUTE
HCV Ab: NONREACTIVE
Hep A IgM: NONREACTIVE
Hep B C IgM: NONREACTIVE
Hepatitis B Surface Ag: NONREACTIVE

## 2019-11-01 LAB — BILIRUBIN, FRACTIONATED(TOT/DIR/INDIR)
Bilirubin, Direct: 5.6 mg/dL — ABNORMAL HIGH (ref 0.0–0.2)
Indirect Bilirubin: 3.3 mg/dL — ABNORMAL HIGH (ref 0.3–0.9)
Total Bilirubin: 8.9 mg/dL — ABNORMAL HIGH (ref 0.3–1.2)

## 2019-11-01 LAB — T4, FREE: Free T4: 0.79 ng/dL (ref 0.61–1.12)

## 2019-11-01 LAB — HIV ANTIBODY (ROUTINE TESTING W REFLEX): HIV Screen 4th Generation wRfx: NONREACTIVE

## 2019-11-01 LAB — LIPASE, BLOOD: Lipase: 93 U/L — ABNORMAL HIGH (ref 11–51)

## 2019-11-01 LAB — TSH: TSH: 6.318 u[IU]/mL — ABNORMAL HIGH (ref 0.350–4.500)

## 2019-11-01 LAB — PHOSPHORUS: Phosphorus: 3.7 mg/dL (ref 2.5–4.6)

## 2019-11-01 LAB — SARS CORONAVIRUS 2 BY RT PCR (HOSPITAL ORDER, PERFORMED IN ~~LOC~~ HOSPITAL LAB): SARS Coronavirus 2: NEGATIVE

## 2019-11-01 LAB — MAGNESIUM: Magnesium: 1.6 mg/dL — ABNORMAL LOW (ref 1.7–2.4)

## 2019-11-01 LAB — ALBUMIN: Albumin: 1.7 g/dL — ABNORMAL LOW (ref 3.5–5.0)

## 2019-11-01 LAB — AMMONIA: Ammonia: 57 umol/L — ABNORMAL HIGH (ref 9–35)

## 2019-11-01 MED ORDER — ONDANSETRON HCL 4 MG PO TABS: 4.0000 mg | ORAL_TABLET | Freq: Four times a day (QID) | ORAL | Status: DC | PRN

## 2019-11-01 MED ORDER — ONDANSETRON HCL 4 MG/2ML IJ SOLN: 4.0000 mg | Freq: Four times a day (QID) | INTRAMUSCULAR | Status: DC | PRN

## 2019-11-01 MED ORDER — VITAMIN K1 10 MG/ML IJ SOLN
10.0000 mg | Freq: Once | INTRAMUSCULAR | Status: AC
  Administered 2019-11-01: 10 mg via SUBCUTANEOUS
  Filled 2019-11-01: qty 1

## 2019-11-01 MED ORDER — THIAMINE HCL 100 MG PO TABS
100.0000 mg | ORAL_TABLET | Freq: Every day | ORAL | Status: DC
  Administered 2019-11-01 – 2019-11-06 (×5): 100 mg via ORAL
  Filled 2019-11-01 (×6): qty 1

## 2019-11-01 MED ORDER — HEPARIN SODIUM (PORCINE) 5000 UNIT/ML IJ SOLN
5000.0000 [IU] | Freq: Three times a day (TID) | INTRAMUSCULAR | Status: DC
  Administered 2019-11-01 – 2019-11-03 (×6): 5000 [IU] via SUBCUTANEOUS
  Filled 2019-11-01 (×7): qty 1

## 2019-11-01 MED ORDER — LORAZEPAM 2 MG/ML IJ SOLN: 0.0000 mg | Freq: Two times a day (BID) | INTRAMUSCULAR | Status: AC

## 2019-11-01 MED ORDER — LORAZEPAM 2 MG/ML IJ SOLN
0.0000 mg | Freq: Four times a day (QID) | INTRAMUSCULAR | Status: AC
  Administered 2019-11-02: 2 mg via INTRAVENOUS
  Filled 2019-11-01: qty 1

## 2019-11-01 MED ORDER — SODIUM CHLORIDE 0.9 % IV SOLN: INTRAVENOUS | Status: DC

## 2019-11-01 MED ORDER — THIAMINE HCL 100 MG/ML IJ SOLN: 100.0000 mg | Freq: Every day | INTRAMUSCULAR | Status: DC

## 2019-11-01 MED ORDER — OXYCODONE HCL 5 MG PO TABS
5.0000 mg | ORAL_TABLET | ORAL | Status: DC | PRN
  Administered 2019-11-01 – 2019-11-05 (×4): 5 mg via ORAL
  Filled 2019-11-01 (×4): qty 1

## 2019-11-01 MED ORDER — LORAZEPAM 1 MG PO TABS: 1.0000 mg | ORAL_TABLET | ORAL | Status: AC | PRN

## 2019-11-01 MED ORDER — METOPROLOL TARTRATE 50 MG PO TABS
50.0000 mg | ORAL_TABLET | Freq: Two times a day (BID) | ORAL | Status: DC
  Administered 2019-11-03 – 2019-11-09 (×12): 50 mg via ORAL
  Filled 2019-11-01 (×15): qty 1

## 2019-11-01 MED ORDER — LORAZEPAM 2 MG/ML IJ SOLN: 1.0000 mg | INTRAMUSCULAR | Status: AC | PRN

## 2019-11-01 MED ORDER — FERROUS SULFATE 325 (65 FE) MG PO TABS: 325.0000 mg | ORAL_TABLET | Freq: Two times a day (BID) | ORAL | Status: DC

## 2019-11-01 MED ORDER — ALBUMIN HUMAN 25 % IV SOLN
50.0000 g | Freq: Every day | INTRAVENOUS | Status: AC
  Administered 2019-11-01: 50 g via INTRAVENOUS
  Administered 2019-11-02: 37.5 g via INTRAVENOUS
  Administered 2019-11-02: 12.5 g via INTRAVENOUS
  Filled 2019-11-01 (×4): qty 200

## 2019-11-01 MED ORDER — ADULT MULTIVITAMIN W/MINERALS CH
1.0000 | ORAL_TABLET | Freq: Every day | ORAL | Status: DC
  Administered 2019-11-01 – 2019-11-09 (×8): 1 via ORAL
  Filled 2019-11-01 (×9): qty 1

## 2019-11-01 MED ORDER — FERROUS SULFATE 325 (65 FE) MG PO TABS
325.0000 mg | ORAL_TABLET | Freq: Every day | ORAL | Status: DC
  Administered 2019-11-01 – 2019-11-09 (×9): 325 mg via ORAL
  Filled 2019-11-01 (×9): qty 1

## 2019-11-01 MED ORDER — MAGNESIUM SULFATE 2 GM/50ML IV SOLN
2.0000 g | Freq: Once | INTRAVENOUS | Status: AC
  Administered 2019-11-01: 2 g via INTRAVENOUS
  Filled 2019-11-01: qty 50

## 2019-11-01 MED ORDER — FOLIC ACID 1 MG PO TABS
1.0000 mg | ORAL_TABLET | Freq: Every day | ORAL | Status: DC
  Administered 2019-11-01 – 2019-11-09 (×9): 1 mg via ORAL
  Filled 2019-11-01 (×9): qty 1

## 2019-11-01 NOTE — Progress Notes (Signed)
PROGRESS NOTE   Nicole [name]  [id number] DOB: November 02, 1950 DOA: 10/31/2019 PCP: Rosita Fire, MD   Chief Complaint  Patient presents with  . Jaundice   Brief Admission History:  69 y.o. female, with history of thyroid nodule, seizures, schizophrenia, HTN, GERD, and Afib who presents to the ED at the recommendation of her PCP. Patient reports that she made an appt with PCP for chronic leg pain, and she was told to come to the ED for jaundice. Patient reports that she had not noticed any change in color at home. Patient does report that she is an alcoholic. She reports that she drinks too much too often.  She was admitted with jaundice and findings of alcoholic hepatitis.   Assessment & Plan:   Principal Problem:   Alcoholic hepatitis Active Problems:   Diverticulosis of sigmoid colon   Hiatal hernia   Chronic alcoholism (HCC)   Arthritis   Hyponatremia   Epigastric abdominal tenderness   Acute liver failure   Hepatic encephalopathy (HCC)   Hypotension   Jaundice   Hyperbilirubinemia   Hyperammonemia (HCC)  1. Alcoholic hepatitis - Pt presented with jaundice and elevated LFTs and markedly elevated bilirubin. She is being treated supportively. GI consult requested.  Acute hepatitis panel pending.  2. Hyponatremia - secondary to chronic liver disease, following.  3. Hepatic encephalopathy - ammonia trending down with therapy, follow.  4. Abdominal pain - generalized - follow up lipase level. No evidence of biliary obstruction on [place] or CT.   DVT prophylaxis: SCDs Code Status: Full  Family Communication:  Disposition:   Status is: Inpatient  Remains inpatient appropriate because:IV treatments appropriate due to intensity of illness or inability to take PO and Inpatient level of care appropriate due to severity of illness  Dispo: The patient is from: Home              Anticipated d/c is to: Home              Anticipated d/c date is: 3 days              Patient currently  is not medically stable to d/c.  Consultants:   GI   Procedures:     Antimicrobials:     Subjective: Pt reports she has abdominal pain, feels like she needs to have bowel movement.   Objective: Vitals:   11/01/19 0900 11/01/19 0945 11/01/19 1050 11/01/19 1157  BP: 95/72  96/78 (!) 82/57  Pulse: 73 64 80 71  Resp: 19 15 12    Temp:      TempSrc:      SpO2: 98% 100% 100% 100%  Height:        Intake/Output Summary (Last 24 hours) at 11/01/2019 1213 Last data filed at 10/31/2019 2251 Gross per 24 hour  Intake 500 ml  Output --  Net 500 ml   Examination:  General exam: chronically ill appearing, diffuse jaundice and scleral icterus, Appears calm and comfortable  Respiratory system: Clear to auscultation. Respiratory effort normal. Cardiovascular system: normal S1 & S2 heard. No JVD, murmurs, rubs, gallops or clicks. 1+ pedal edema. Gastrointestinal system: Abdomen is nondistended, soft and epigastric tenderness with no guarding. No organomegaly or masses felt. Normal bowel sounds heard. Central nervous system: oriented to person. No focal neurological deficits. Extremities: no gross abnormalities.  Skin: No rashes, lesions or ulcers Psychiatry: flat affect.   Data Reviewed: I have personally reviewed following labs and imaging studies  CBC: Recent Labs  Lab 10/31/19 1636  11/01/19 0328  WBC 6.7 5.6  NEUTROABS 5.4  --   HGB 10.0* 9.0*  HCT 29.8* 26.8*  MCV 98.3 99.6  PLT 142* 121*    Basic Metabolic Panel: Recent Labs  Lab 10/31/19 1636 11/01/19 0328  NA 123* 123*  K 4.1 3.6  CL 89* 93*  CO2 21* 20*  GLUCOSE 81 70  BUN 30* 30*  CREATININE 1.88* 1.93*  CALCIUM 8.0* 7.7*  MG  --  1.6*  PHOS  --  3.7    GFR: CrCl cannot be calculated (Unknown ideal weight.).  Liver Function Tests: Recent Labs  Lab 10/31/19 1636 11/01/19 0328  AST 103* 96*  ALT 96* 85*  ALKPHOS 488* 428*  BILITOT 9.0* 8.9*  9.0*  PROT 5.5* 4.9*  ALBUMIN 1.8* 1.5*     CBG: No results for input(s): GLUCAP in the last 168 hours.  Recent Results (from the past 240 hour(s))  SARS Coronavirus 2 by RT PCR (hospital order, performed in [facility] hospital lab) Nasopharyngeal Nasopharyngeal Swab     Status: None   Collection Time: 10/31/19 11:20 PM   Specimen: Nasopharyngeal Swab  Result Value Ref Range Status   SARS Coronavirus 2 NEGATIVE NEGATIVE Final    Comment: (NOTE) SARS-CoV-2 target nucleic acids are NOT DETECTED.  The SARS-CoV-2 RNA is generally detectable in upper and lower respiratory specimens during the acute phase of infection. The lowest concentration of SARS-CoV-2 viral copies this assay can detect is 250 copies / mL. A negative result does not preclude SARS-CoV-2 infection and should not be used as the sole basis for treatment or other patient management decisions.  A negative result may occur with improper specimen collection / handling, submission of specimen other than nasopharyngeal swab, presence of viral mutation(s) within the areas targeted by this assay, and inadequate number of viral copies (<250 copies / mL). A negative result must be combined with clinical observations, patient history, and epidemiological information.  Fact Sheet for Patients:   StrictlyIdeas.no  Fact Sheet for Healthcare Providers: BankingDealers.co.za  This test is not yet approved or  cleared by the [place] FDA and has been authorized for detection and/or diagnosis of SARS-CoV-2 by FDA under an Emergency Use Authorization (EUA).  This EUA will remain in effect (meaning this test can be used) for the duration of the COVID-19 declaration under Section 564(b)(1) of the Act, 21 U.S.C. section 360bbb-3(b)(1), unless the authorization is terminated or revoked sooner.  Performed at [facility], [address] [place],  [address]      Radiology Studies: CT ABDOMEN PELVIS WO  CONTRAST  Result Date: 10/31/2019 CLINICAL DATA:  Weakness, diarrhea, jaundice, diffuse abdominal pain EXAM: CT ABDOMEN AND PELVIS WITHOUT CONTRAST TECHNIQUE: Multidetector CT imaging of the abdomen and pelvis was performed following the standard protocol without IV contrast. COMPARISON:  12/24/2016, 08/05/2019 FINDINGS: Lower chest: There is a large hiatal hernia containing the majority of the stomach. Trace left pleural effusion with compressive atelectasis in the left lower lobe. The right lung base is clear. Hepatobiliary: There is severe hepatic steatosis again noted, unchanged. No focal liver abnormality. Gallbladder is unremarkable. No biliary dilation. Pancreas: Pancreas is atrophic without focal abnormality. The complex fluid collection between the pancreatic tail and the left lobe liver on prior study has resolved in the interim. Spleen: Normal in size without focal abnormality. Adrenals/Urinary Tract: The right kidney and adrenal gland are surgically absent. Left kidney and adrenal are unremarkable. No urinary tract calculi or obstruction. Bladder is decompressed without  focal abnormality. Stomach/Bowel: There is diffuse colonic diverticulosis without diverticulitis. No bowel obstruction or ileus. Normal appendix right lower quadrant. Vascular/Lymphatic: Aortic atherosclerosis. No enlarged abdominal or pelvic lymph nodes. Reproductive: Uterus and bilateral adnexa are unremarkable. Other: Trace simple appearing free fluid in the bilateral upper quadrants and lower pelvis. No free intraperitoneal gas. No abdominal wall hernia. Musculoskeletal: No acute or destructive bony lesions. Reconstructed images demonstrate no additional findings. IMPRESSION: 1. Interval resolution of the complex fluid collection between the pancreatic tail and left lobe liver. 2. Stable severe hepatic steatosis. 3. Large hiatal hernia. 4. Trace simple appearing free fluid in the bilateral upper quadrants and lower pelvis. 5.  Diffuse colonic diverticulosis without diverticulitis. 6. Aortic Atherosclerosis (ICD10-I70.0). Electronically Signed   By: Randa Ngo M.D.   On: 10/31/2019 22:22   US Venous Img Lower Bilateral (DVT)  Result Date: 11/01/2019 CLINICAL DATA:  Lower extremity edema. EXAM: BILATERAL LOWER EXTREMITY VENOUS DOPPLER ULTRASOUND TECHNIQUE: Gray-scale sonography with graded compression, as well as color Doppler and duplex ultrasound were performed to evaluate the lower extremity deep venous systems from the level of the common femoral vein and including the common femoral, femoral, profunda femoral, popliteal and calf veins including the posterior tibial, peroneal and gastrocnemius veins when visible. The superficial great saphenous vein was also interrogated. Spectral Doppler was utilized to evaluate flow at rest and with distal augmentation maneuvers in the common femoral, femoral and popliteal veins. COMPARISON:  None. FINDINGS: RIGHT LOWER EXTREMITY Common Femoral Vein: No evidence of thrombus. Normal compressibility, respiratory phasicity and response to augmentation. Saphenofemoral Junction: No evidence of thrombus. Normal compressibility and flow on color Doppler imaging. Profunda Femoral Vein: No evidence of thrombus. Normal compressibility and flow on color Doppler imaging. Femoral Vein: No evidence of thrombus. Normal compressibility, respiratory phasicity and response to augmentation. Popliteal Vein: No evidence of thrombus. Normal compressibility, respiratory phasicity and response to augmentation. Calf Veins: No evidence of thrombus. Normal compressibility and flow on color Doppler imaging. Superficial Great Saphenous Vein: No evidence of thrombus. Normal compressibility. Venous Reflux:  None. Other Findings:  None. LEFT LOWER EXTREMITY Common Femoral Vein: No evidence of thrombus. Normal compressibility, respiratory phasicity and response to augmentation. Saphenofemoral Junction: No evidence of  thrombus. Normal compressibility and flow on color Doppler imaging. Profunda Femoral Vein: No evidence of thrombus. Normal compressibility and flow on color Doppler imaging. Femoral Vein: No evidence of thrombus. Normal compressibility, respiratory phasicity and response to augmentation. Popliteal Vein: No evidence of thrombus. Normal compressibility, respiratory phasicity and response to augmentation. Calf Veins: No evidence of thrombus. Normal compressibility and flow on color Doppler imaging. Superficial Great Saphenous Vein: No evidence of thrombus. Normal compressibility. Venous Reflux:  None. Other Findings:  None. IMPRESSION: No evidence of deep venous thrombosis in either lower extremity. Electronically Signed   By: Kerby Moors M.D.   On: 11/01/2019 10:06   US Abdomen Limited RUQ  Result Date: 11/01/2019 CLINICAL DATA:  Acute liver failure. EXAM: ULTRASOUND ABDOMEN LIMITED RIGHT UPPER QUADRANT COMPARISON:  CT abdomen pelvis, 10/31/2019 FINDINGS: Gallbladder: No gallstones or wall thickening visualized. No sonographic Murphy sign noted by sonographer. Common bile duct: Diameter: 7 mm Liver: Marked increase in liver parenchymal echogenicity which leads to poor through transmission of the sound beam. Liver assessment is somewhat limited due to the poor penetration. No defined liver mass. Portal vein is patent on color Doppler imaging with normal direction of blood flow towards the liver. Other: Small amount of ascites noted along the inferior liver margin. IMPRESSION:  1. Marked increased liver parenchymal echogenicity consistent with extensive hepatic steatosis, which was also evident on the previous day's CT. 2. No acute findings. Electronically Signed   By: Lajean Manes M.D.   On: 11/01/2019 10:13   Scheduled Meds: . ferrous sulfate  325 mg Oral Q breakfast  . folic acid  1 mg Oral Daily  . heparin  5,000 Units Subcutaneous Q8H  . LORazepam  0-4 mg Intravenous Q6H   Followed by  . [START ON  11/03/2019] LORazepam  0-4 mg Intravenous Q12H  . metoprolol tartrate  50 mg Oral BID  . multivitamin with minerals  1 tablet Oral Daily  . phytonadione  10 mg Subcutaneous Once  . thiamine  100 mg Oral Daily   Or  . thiamine  100 mg Intravenous Daily   Continuous Infusions: . sodium chloride 75 mL/hr at 11/01/19 0804  . albumin human      LOS: 1 day   Time spent: 36 mins  Othniel Maret Wynetta Emery, MD How to contact the [facility] Attending or Consulting provider [place] or covering provider during after hours [place], for this patient?  1. Check the care team in [facility] and look for a) attending/consulting [facility] provider listed and b) the [facility] team listed 2. Log into [email or web address] and use [place]'s universal password to access. If you do not have the password, please contact the hospital operator. 3. Locate the [facility] provider you are looking for under [facility] and page to a number that you can be directly reached. 4. If you still have difficulty reaching the provider, please page the [facility] (Director on Call) for the Hospitalists listed on amion for assistance.  11/01/2019, 12:13 PM

## 2019-11-01 NOTE — Consult Note (Addendum)
Referring Provider: Irwin Brakeman, MD Primary Care Physician:  Nicole Fire, MD Primary Gastroenterologist:  Dr. Laural [name]  Reason for Consultation:    Jaundice.  HPI:   Patient is 69 year old Caucasian female with history of atrial fibrillation GERD with known large hiatal hernia chronic alcohol abuse history of renal cell carcinoma status post right nephrectomy in August 2019, seizure disorder, history of pancreatic pseudocyst presenting as a gastric mass on EGD(EUS in November 2018 at [facility], fluid CEA 71 and amylase 3345) Patient has been feeling poorly for several weeks and she went to see her primary care physician Dr. Sherrie [name] yesterday who noted her to be jaundice and advised her to go to the hospital.  Patient wanted to come to [facility] for further evaluation. Evaluation in emergency room revealed hyponatremia with serum sodium of 123, BUN of 30 and a creatinine of 1.88.  Bilirubin was 9 AP 488, AST 103 and ALT 96.  Serum albumin was 1.8.  INR was 1.6.  WBC 6.7 H&H 10.0 and 29.8.  Platelet count was 142K.  Her serum ammonia was also elevated at 70.  Acetaminophen level was less than 10.  She underwent abdominal pelvic CT which revealed severe but chronic hepatic steatosis large hiatal hernia trace perihepatic and perisplenic fluid.  Colonic diverticulosis without diverticulitis and interval resolution of complex fluid collection between pancreatic tail and left lobe of the liver. Patient was admitted to hospitalist service and begun on folic acid, thiamine, multivitamin IV fluids as well as lactulose.  She is on lorazepam to prevent alcohol withdrawal. This morning she had abdominal ultrasound which once again revealed very echogenic liver and bile duct measuring 6.7 mm.  She also had Doppler study of lower extremities and studies negative for thrombosis.  Patient states she drinks too much vodka.  Last drink was maybe 3 days ago.  She says she has been feeling poorly for several weeks.   She states she has horrible diet.  She has had nausea and sporadic emesis.  No history of hematemesis or melena.  She also complains of diarrhea and pain across upper abdomen.  Patient tells me that she has been having hallucinations.  She also complains of numbness in both feet and legs.  She has difficulty ambulation.  She states she is losing weight but she is not sure how much.  She feels her abdomen is becoming more more distended.  Patient also reports having issues with her memory and unable to think straight.  She also complains of weakness in right upper extremity.  She says this started sometime after she had kidney surgery in 2019  Patient is married.  She lives in [place] with her husband and one of her 3 sons also lives with her.  She is retired Therapist, sports.  She says she used to drink alcohol socially but since she has been retired she has been drinking vodka for least 10 years.  She states she drinks too much.  She smokes cigarettes for about 5 years less than a pack per day but quit 15 years ago.  Both parents died of cardiac disease in their 79s.  She has 1 sister living.  2 sisters and 1 brother are disease.  She states her sisters had some kind of malignancy.   Past Medical History:  Diagnosis Date  . Anemia   . Arthritis   . Asthma    as a child  . Atrial fibrillation with RVR ([place])   . Cancer ([place])    right kidney  .  Chronic alcoholism ([place])   . Chronic left shoulder pain   . Dysrhythmia    Atrial Fib with RVR   . GERD (gastroesophageal reflux disease)   . Heartburn   . Hiatal hernia 12/17/2016   stomach noted in chest on CT scan  . History of blood transfusion   . Hypertension   . Memory loss   . Schizophrenia ([place])   . Seizures ([place])     had when she had period and when she ovalate  . Thyroid nodule     Past Surgical History:  Procedure Laterality Date  . ANKLE FRACTURE SURGERY Right 2008   screws  . ANKLE FRACTURE SURGERY Left 2008   plates  .  ESOPHAGOGASTRODUODENOSCOPY (EGD) WITH PROPOFOL N/A 12/18/2016   Procedure: ESOPHAGOGASTRODUODENOSCOPY (EGD) WITH PROPOFOL;  Surgeon: Wilford Corner, MD;  Location: [place];  Service: Endoscopy;  Laterality: N/A;  . NASAL SINUS SURGERY Left 11/17/2017   Procedure: ENDOSCOPIC SINUS approach;  Surgeon: Melida Quitter, MD;  Location: [place];  Service: ENT;  Laterality: Left;  . NEPHRECTOMY Right    robotic riight radical   . ORIF ORBITAL FRACTURE Left 11/17/2017   Procedure: Trans Antral Left sided orbital floor fracture repair with dissovable floor implant;  Surgeon: Melida Quitter, MD;  Location: [place];  Service: ENT;  Laterality: Left;    Prior to Admission medications   Medication Sig Start Date End Date Taking? Authorizing Provider  Calcium Carbonate-Vitamin D (CALCIUM 500 + D PO) Take 1 tablet by mouth daily.    [provider]  clindamycin (CLEOCIN) 150 MG capsule Take 1 capsule (150 mg total) by mouth every 6 (six) hours. 11/13/17   Nat Christen, MD  ferrous sulfate 325 (65 FE) MG tablet Take 325 mg by mouth 2 (two) times daily with a meal.    [provider]  Lido-Capsaicin-Men-Methyl Sal (MEDI-PATCH-LIDOCAINE EX) Apply topically.    [provider]  Magnesium 250 MG TABS Take 2 tablets by mouth daily.    [provider]  metoprolol tartrate (LOPRESSOR) 50 MG tablet Take 50 mg by mouth 2 (two) times daily.    [provider]  Multiple Vitamins-Minerals (MULTIVITAMIN WITH MINERALS) tablet Take 1 tablet by mouth daily.    [provider]  potassium gluconate (HM POTASSIUM) 595 (99 K) MG TABS tablet Take 595 mg by mouth daily.    [provider]  Propylene Glycol (SYSTANE COMPLETE) 0.6 % SOLN Place 1 drop into both eyes See admin instructions. Instill one drop into both eyes every morning, may also use later in the day as needed for dry eyes    [provider]    Current Facility-Administered Medications  Medication  Dose Route Frequency Provider Last Rate Last Admin  . 0.9 %  sodium chloride infusion   Intravenous Continuous Nicole [name] L, MD 75 mL/hr at 11/01/19 0804 New Bag at 11/01/19 0804  . ferrous sulfate tablet 325 mg  325 mg Oral Q breakfast Johnson, Clanford L, MD   325 mg at 11/01/19 1049  . folic acid (FOLVITE) tablet 1 mg  1 mg Oral Daily Zierle-Ghosh, Asia B, DO   1 mg at 11/01/19 1050  . heparin injection 5,000 Units  5,000 Units Subcutaneous Q8H Zierle-Ghosh, Asia B, DO   5,000 Units at 11/01/19 0559  . LORazepam (ATIVAN) injection 0-4 mg  0-4 mg Intravenous Q6H Zierle-Ghosh, Asia B, DO       Followed by  . [START ON 11/03/2019] LORazepam (ATIVAN) injection 0-4 mg  0-4 mg Intravenous Q12H Zierle-Ghosh, Asia B, DO      . LORazepam (ATIVAN) tablet 1-4 mg  1-4 mg Oral Q1H PRN Zierle-Ghosh, Asia B, DO       Or  . LORazepam (ATIVAN) injection 1-4 mg  1-4 mg Intravenous Q1H PRN Zierle-Ghosh, Asia B, DO      . metoprolol tartrate (LOPRESSOR) tablet 50 mg  50 mg Oral BID Zierle-Ghosh, Asia B, DO      . multivitamin with minerals tablet 1 tablet  1 tablet Oral Daily Zierle-Ghosh, Asia B, DO   1 tablet at 11/01/19 1049  . ondansetron (ZOFRAN) tablet 4 mg  4 mg Oral Q6H PRN Zierle-Ghosh, Asia B, DO       Or  . ondansetron (ZOFRAN) injection 4 mg  4 mg Intravenous Q6H PRN Zierle-Ghosh, Asia B, DO      . oxyCODONE (Oxy IR/ROXICODONE) immediate release tablet 5 mg  5 mg Oral Q4H PRN Zierle-Ghosh, Asia B, DO      . thiamine tablet 100 mg  100 mg Oral Daily Zierle-Ghosh, Asia B, DO   100 mg at 11/01/19 1049   Or  . thiamine (B-1) injection 100 mg  100 mg Intravenous Daily Zierle-Ghosh, Asia B, DO       Current Outpatient Medications  Medication Sig Dispense Refill  . Calcium Carbonate-Vitamin D (CALCIUM 500 + D PO) Take 1 tablet by mouth daily.    . clindamycin (CLEOCIN) 150 MG capsule Take 1 capsule (150 mg total) by mouth every 6 (six) hours. 28 capsule 0  . ferrous sulfate 325 (65 FE) MG tablet  Take 325 mg by mouth 2 (two) times daily with a meal.    . Lido-Capsaicin-Men-Methyl Sal (MEDI-PATCH-LIDOCAINE EX) Apply topically.    . Magnesium 250 MG TABS Take 2 tablets by mouth daily.    . metoprolol tartrate (LOPRESSOR) 50 MG tablet Take 50 mg by mouth 2 (two) times daily.    . Multiple Vitamins-Minerals (MULTIVITAMIN WITH MINERALS) tablet Take 1 tablet by mouth daily.    . potassium gluconate (HM POTASSIUM) 595 (99 K) MG TABS tablet Take 595 mg by mouth daily.    Marland Kitchen Propylene Glycol (SYSTANE COMPLETE) 0.6 % SOLN Place 1 drop into both eyes See admin instructions. Instill one drop into both eyes every morning, may also use later in the day as needed for dry eyes      Allergies as of 10/31/2019 - Review Complete 10/31/2019  Allergen Reaction Noted  . Other Shortness Of Breath and Anxiety 12/16/2016  . Shellfish allergy Shortness Of Breath 12/16/2016  . Iodinated diagnostic agents Swelling 12/16/2016    Family History  Problem Relation Age of Onset  . Stroke Mother   . Heart attack Father   . Ulcers Father   . Diabetes Sister   . Cancer Brother   . Diabetes Sister   . Diabetes Sister     Social History   Socioeconomic History  . Marital status: Single    Spouse name: Not on file  . Number of children: Not on file  . Years of education: Not on file  . Highest education level: Not on file  Occupational History  . Not on file  Tobacco Use  . Smoking status: Former Smoker    Years: 15.00    Types: Cigarettes    Quit date: 11/14/2007    Years since quitting: 11.9  . Smokeless tobacco: Never Used  Vaping Use  . Vaping Use: Never used  Substance and Sexual  Activity  . Alcohol use: Not Currently    Comment: none since fall 11/13/17  . Drug use: No  . Sexual activity: Not Currently  Other Topics Concern  . Not on file  Social History Narrative   quit drinking in late August but went on a binge over last weekend (october 26, 26, 28).  Hasn't drank since Sunday (6 days  ago) due to abd pain, N, V   Social Determinants of Health   Financial Resource Strain:   . Difficulty of Paying Living Expenses: Not on file  Food Insecurity:   . Worried About Charity fundraiser in the Last Year: Not on file  . Ran Out of Food in the Last Year: Not on file  Transportation Needs:   . Lack of Transportation (Medical): Not on file  . Lack of Transportation (Non-Medical): Not on file  Physical Activity:   . Days of Exercise per Week: Not on file  . Minutes of Exercise per Session: Not on file  Stress:   . Feeling of Stress : Not on file  Social Connections:   . Frequency of Communication with Friends and Family: Not on file  . Frequency of Social Gatherings with Friends and Family: Not on file  . Attends Religious Services: Not on file  . Active Member of Clubs or Organizations: Not on file  . Attends Archivist Meetings: Not on file  . Marital Status: Not on file  Intimate Partner Violence:   . Fear of Current or Ex-Partner: Not on file  . Emotionally Abused: Not on file  . Physically Abused: Not on file  . Sexually Abused: Not on file    Review of Systems: See HPI, otherwise normal ROS  Physical Exam: Temp:  [97.8 F (36.6 C)] 97.8 F (36.6 C) (09/16 1610) Pulse Rate:  [55-80] 80 (09/17 1050) Resp:  [11-19] 12 (09/17 1050) BP: (72-110)/(47-78) 96/78 (09/17 1050) SpO2:  [96 %-100 %] 100 % (09/17 1050)   Patient is alert and responds appropriately to questions. She knows she is is in the emergency room.  She thinks is September 12.  She does not know the day but knows the year. She does not have tremors or asterixis. She has small area of ecchymosis in front of right ear. Conjunctivae is pink.  Sclera is icteric. Oropharyngeal mucosa is very dry. She has few teeth in lower and upper jaw in poor condition. Neck without masses adenopathy or thyromegaly. Cardiac exam with regular rhythm normal S1 and S2.  No murmur gallop noted. Auscultation  of lungs reveal vesicular breath sounds bilaterally. Abdomen is full.  Bowel sounds are normal.  On palpation is soft.  She has mild tenderness across upper abdomen.  Liver edge is indistinct. She does not have clubbing or peripheral edema. She has few ecchymosis over her upper extremities.     Lab Results: Recent Labs    10/31/19 1636 11/01/19 0328  WBC 6.7 5.6  HGB 10.0* 9.0*  HCT 29.8* 26.8*  PLT 142* 121*   BMET Recent Labs    10/31/19 1636 11/01/19 0328  NA 123* 123*  K 4.1 3.6  CL 89* 93*  CO2 21* 20*  GLUCOSE 81 70  BUN 30* 30*  CREATININE 1.88* 1.93*  CALCIUM 8.0* 7.7*   LFT Recent Labs    11/01/19 0328  PROT 4.9*  ALBUMIN 1.5*  AST 96*  ALT 85*  ALKPHOS 428*  BILITOT 8.9*  9.0*  BILIDIR 5.6*  IBILI 3.3*  PT/INR Recent Labs    10/31/19 1636  LABPROT 18.2*  INR 1.6*   Hepatitis Panel Pending.  Covid test is negative.  Studies/Results: CT ABDOMEN PELVIS WO CONTRAST  Result Date: 10/31/2019 CLINICAL DATA:  Weakness, diarrhea, jaundice, diffuse abdominal pain EXAM: CT ABDOMEN AND PELVIS WITHOUT CONTRAST TECHNIQUE: Multidetector CT imaging of the abdomen and pelvis was performed following the standard protocol without IV contrast. COMPARISON:  12/24/2016, 08/05/2019 FINDINGS: Lower chest: There is a large hiatal hernia containing the majority of the stomach. Trace left pleural effusion with compressive atelectasis in the left lower lobe. The right lung base is clear. Hepatobiliary: There is severe hepatic steatosis again noted, unchanged. No focal liver abnormality. Gallbladder is unremarkable. No biliary dilation. Pancreas: Pancreas is atrophic without focal abnormality. The complex fluid collection between the pancreatic tail and the left lobe liver on prior study has resolved in the interim. Spleen: Normal in size without focal abnormality. Adrenals/Urinary Tract: The right kidney and adrenal gland are surgically absent. Left kidney and adrenal are  unremarkable. No urinary tract calculi or obstruction. Bladder is decompressed without focal abnormality. Stomach/Bowel: There is diffuse colonic diverticulosis without diverticulitis. No bowel obstruction or ileus. Normal appendix right lower quadrant. Vascular/Lymphatic: Aortic atherosclerosis. No enlarged abdominal or pelvic lymph nodes. Reproductive: Uterus and bilateral adnexa are unremarkable. Other: Trace simple appearing free fluid in the bilateral upper quadrants and lower pelvis. No free intraperitoneal gas. No abdominal wall hernia. Musculoskeletal: No acute or destructive bony lesions. Reconstructed images demonstrate no additional findings. IMPRESSION: 1. Interval resolution of the complex fluid collection between the pancreatic tail and left lobe liver. 2. Stable severe hepatic steatosis. 3. Large hiatal hernia. 4. Trace simple appearing free fluid in the bilateral upper quadrants and lower pelvis. 5. Diffuse colonic diverticulosis without diverticulitis. 6. Aortic Atherosclerosis (ICD10-I70.0). Electronically Signed   By: Randa Ngo M.D.   On: 10/31/2019 22:22   US Venous Img Lower Bilateral (DVT)  Result Date: 11/01/2019 CLINICAL DATA:  Lower extremity edema. EXAM: BILATERAL LOWER EXTREMITY VENOUS DOPPLER ULTRASOUND TECHNIQUE: Gray-scale sonography with graded compression, as well as color Doppler and duplex ultrasound were performed to evaluate the lower extremity deep venous systems from the level of the common femoral vein and including the common femoral, femoral, profunda femoral, popliteal and calf veins including the posterior tibial, peroneal and gastrocnemius veins when visible. The superficial great saphenous vein was also interrogated. Spectral Doppler was utilized to evaluate flow at rest and with distal augmentation maneuvers in the common femoral, femoral and popliteal veins. COMPARISON:  None. FINDINGS: RIGHT LOWER EXTREMITY Common Femoral Vein: No evidence of thrombus. Normal  compressibility, respiratory phasicity and response to augmentation. Saphenofemoral Junction: No evidence of thrombus. Normal compressibility and flow on color Doppler imaging. Profunda Femoral Vein: No evidence of thrombus. Normal compressibility and flow on color Doppler imaging. Femoral Vein: No evidence of thrombus. Normal compressibility, respiratory phasicity and response to augmentation. Popliteal Vein: No evidence of thrombus. Normal compressibility, respiratory phasicity and response to augmentation. Calf Veins: No evidence of thrombus. Normal compressibility and flow on color Doppler imaging. Superficial Great Saphenous Vein: No evidence of thrombus. Normal compressibility. Venous Reflux:  None. Other Findings:  None. LEFT LOWER EXTREMITY Common Femoral Vein: No evidence of thrombus. Normal compressibility, respiratory phasicity and response to augmentation. Saphenofemoral Junction: No evidence of thrombus. Normal compressibility and flow on color Doppler imaging. Profunda Femoral Vein: No evidence of thrombus. Normal compressibility and flow on color Doppler imaging. Femoral Vein: No evidence of thrombus. Normal compressibility,  respiratory phasicity and response to augmentation. Popliteal Vein: No evidence of thrombus. Normal compressibility, respiratory phasicity and response to augmentation. Calf Veins: No evidence of thrombus. Normal compressibility and flow on color Doppler imaging. Superficial Great Saphenous Vein: No evidence of thrombus. Normal compressibility. Venous Reflux:  None. Other Findings:  None. IMPRESSION: No evidence of deep venous thrombosis in either lower extremity. Electronically Signed   By: Kerby Moors M.D.   On: 11/01/2019 10:06   US Abdomen Limited RUQ  Result Date: 11/01/2019 CLINICAL DATA:  Acute liver failure. EXAM: ULTRASOUND ABDOMEN LIMITED RIGHT UPPER QUADRANT COMPARISON:  CT abdomen pelvis, 10/31/2019 FINDINGS: Gallbladder: No gallstones or wall thickening  visualized. No sonographic Murphy sign noted by sonographer. Common bile duct: Diameter: 7 mm Liver: Marked increase in liver parenchymal echogenicity which leads to poor through transmission of the sound beam. Liver assessment is somewhat limited due to the poor penetration. No defined liver mass. Portal vein is patent on color Doppler imaging with normal direction of blood flow towards the liver. Other: Small amount of ascites noted along the inferior liver margin. IMPRESSION: 1. Marked increased liver parenchymal echogenicity consistent with extensive hepatic steatosis, which was also evident on the previous day's CT. 2. No acute findings. Electronically Signed   By: Lajean Manes M.D.   On: 11/01/2019 10:13    Assessment;  Patient is 69 year old Caucasian female with multiple medical problems including ongoing alcohol abuse, history of alcoholic liver disease who presents with failure to thrive and has jaundice.  #1.  Jaundice.  She has intrahepatic cholestasis secondary to alcohol hepatitis.  She does not have evidence of biliary obstruction based on CT and ultrasound.  Her hepatitis discriminant function is 37.52 based on labs from yesterday.  If viral markers for hepatitis B and C are negative she may be candidate for prednisolone if there is evidence of progression of hepatic dysfunction.  Patient has severe hypoalbuminemia.  She may benefit from IV albumin which may help improve renal circulation.  #2.  Hepatic encephalopathy secondary to decompensated alcoholic hepatitis.  It remains to be seen if she has underlying cirrhosis as well.  At least she does not have splenomegaly.  Patient is on lactulose.  She may or may not need Xifaxan depending on her clinical course.  #3.  History of extrinsic mass noted on EGD of November 2018 and it turns out that mass was cystic on EUS and fluid analysis consistent with pancreatic pseudocyst.  Does not have any cyst on CT from this admission.  Will check  serum lipase since she is having upper abdominal pain.  #4.  Dehydration with electrolyte abnormalities.  Suspect sodium secondary to dehydration.  If hyponatremia persists would consider screening for Addison's disease.  #5.  Anemia appears to be due to chronic disease.  Will watch for evidence of GI bleed.  #6.  History of renal carcinoma.  Status post right nephrectomy in November 2018.  She appears to be in remission.  #7.  Acute on chronic kidney disease secondary to dehydration.  With decompensated liver disease she is also at risk for hepatorenal syndrome.  Recommendations;  No acetaminophen for this patient. Vitamin K 10 mg subcu x1. Daily INR and LFTs for the next 3 days. Check serum lipase. Albumin infusion 50 g IV daily for 3 days.   LOS: 1 day   Nicole [name]  11/01/2019, 11:01 AM

## 2019-11-01 NOTE — NC FL2 (Signed)
[place] LEVEL OF CARE SCREENING TOOL     IDENTIFICATION  Patient Name: Nicole [name] Birthdate: 03/28/1950 Sex: female Admission Date (Current Location): 10/31/2019  [facility] and [place] Number:  [employer] and Address:  [facility] [address] [place]      Provider Number: [id number]  Attending Physician Name and Address:  Murlean Iba, MD  Relative Name and Phone Number:       Current Level of Care: Hospital Recommended Level of Care: [place] Prior Approval Number:    Date Approved/Denied:   PASRR Number: [id number]  Discharge Plan: SNF    Current Diagnoses: Patient Active Problem List   Diagnosis Date Noted  . Hepatic encephalopathy ([place]) 11/01/2019  . Hypotension 11/01/2019  . Jaundice 11/01/2019  . Hyperbilirubinemia 11/01/2019  . Hyperammonemia ([place]) 11/01/2019  . Alcoholic hepatitis 78/93/8101  . Acute liver failure 10/31/2019  . Acute respiratory failure with hypoxia ([place])   . Epigastric abdominal tenderness 12/18/2016  . Gastric mass 12/17/2016  . Right renal mass 12/17/2016  . Diverticulosis of sigmoid colon 12/17/2016  . Hiatal hernia 12/17/2016  . Chronic alcoholism ([place]) 12/17/2016  . Chronic left shoulder pain 12/17/2016  . Arthritis 12/17/2016  . Hyponatremia 12/17/2016  . Hypokalemia 12/17/2016  . Anemia 12/17/2016  . Atrial fibrillation with rapid ventricular response ([place]) 12/17/2016    Orientation RESPIRATION BLADDER Height & Weight     Self, Time, Situation, Place  Normal Continent Weight:   Height:  5\' 8"  (172.7 cm)  BEHAVIORAL SYMPTOMS/MOOD NEUROLOGICAL BOWEL NUTRITION STATUS      Continent Diet (see dc summary)  AMBULATORY STATUS COMMUNICATION OF NEEDS Skin   Extensive Assist Verbally Normal                       Personal Care Assistance Level of Assistance  Bathing, Feeding, Dressing Bathing Assistance: Limited assistance Feeding  assistance: Independent Dressing Assistance: Limited assistance     Functional Limitations Info  Sight, Hearing, Speech Sight Info: Adequate Hearing Info: Adequate Speech Info: Adequate    SPECIAL CARE FACTORS FREQUENCY  PT (By licensed PT), OT (By licensed OT)     PT Frequency: 5x week OT Frequency: 3x week            Contractures      Additional Factors Info  Code Status, Allergies Code Status Info: full Allergies Info: shellfish, iodinated diagnostic agents, fast foods and processed food           Current Medications (11/01/2019):  This is the current hospital active medication list Current Facility-Administered Medications  Medication Dose Route Frequency Provider Last Rate Last Admin  . 0.9 %  sodium chloride infusion   Intravenous Continuous Irwin Brakeman L, MD 75 mL/hr at 11/01/19 0804 New Bag at 11/01/19 0804  . albumin human 25 % solution 50 g  50 g Intravenous Daily Rogene Houston, MD 60 mL/hr at 11/01/19 1250 50 g at 11/01/19 1250  . ferrous sulfate tablet 325 mg  325 mg Oral Q breakfast Johnson, Clanford L, MD   325 mg at 11/01/19 1049  . folic acid (FOLVITE) tablet 1 mg  1 mg Oral Daily Zierle-Ghosh, Asia B, DO   1 mg at 11/01/19 1050  . heparin injection 5,000 Units  5,000 Units Subcutaneous Q8H Zierle-Ghosh, Asia B, DO   5,000 Units at 11/01/19 0559  . LORazepam (ATIVAN) injection 0-4 mg  0-4 mg Intravenous Q6H Zierle-Ghosh, Asia B,  DO       Followed by  . [START ON 11/03/2019] LORazepam (ATIVAN) injection 0-4 mg  0-4 mg Intravenous Q12H Zierle-Ghosh, Asia B, DO      . LORazepam (ATIVAN) tablet 1-4 mg  1-4 mg Oral Q1H PRN Zierle-Ghosh, Asia B, DO       Or  . LORazepam (ATIVAN) injection 1-4 mg  1-4 mg Intravenous Q1H PRN Zierle-Ghosh, Asia B, DO      . metoprolol tartrate (LOPRESSOR) tablet 50 mg  50 mg Oral BID Zierle-Ghosh, Asia B, DO      . multivitamin with minerals tablet 1 tablet  1 tablet Oral Daily Zierle-Ghosh, Asia B, DO   1 tablet at  11/01/19 1049  . ondansetron (ZOFRAN) tablet 4 mg  4 mg Oral Q6H PRN Zierle-Ghosh, Asia B, DO       Or  . ondansetron (ZOFRAN) injection 4 mg  4 mg Intravenous Q6H PRN Zierle-Ghosh, Asia B, DO      . oxyCODONE (Oxy IR/ROXICODONE) immediate release tablet 5 mg  5 mg Oral Q4H PRN Zierle-Ghosh, Asia B, DO      . thiamine tablet 100 mg  100 mg Oral Daily Zierle-Ghosh, Asia B, DO   100 mg at 11/01/19 1049   Or  . thiamine (B-1) injection 100 mg  100 mg Intravenous Daily Zierle-Ghosh, Asia B, DO       Current Outpatient Medications  Medication Sig Dispense Refill  . Calcium Carbonate-Vitamin D (CALCIUM 500 + D PO) Take 1 tablet by mouth daily.    . clindamycin (CLEOCIN) 150 MG capsule Take 1 capsule (150 mg total) by mouth every 6 (six) hours. 28 capsule 0  . ferrous sulfate 325 (65 FE) MG tablet Take 325 mg by mouth 2 (two) times daily with a meal.    . Lido-Capsaicin-Men-Methyl Sal (MEDI-PATCH-LIDOCAINE EX) Apply topically.    . Magnesium 250 MG TABS Take 2 tablets by mouth daily.    . metoprolol tartrate (LOPRESSOR) 50 MG tablet Take 50 mg by mouth 2 (two) times daily.    . Multiple Vitamins-Minerals (MULTIVITAMIN WITH MINERALS) tablet Take 1 tablet by mouth daily.    . potassium gluconate (HM POTASSIUM) 595 (99 K) MG TABS tablet Take 595 mg by mouth daily.    Marland Kitchen Propylene Glycol (SYSTANE COMPLETE) 0.6 % SOLN Place 1 drop into both eyes See admin instructions. Instill one drop into both eyes every morning, may also use later in the day as needed for dry eyes       Discharge Medications: Please see discharge summary for a list of discharge medications.  Relevant Imaging Results:  Relevant Lab Results:   Additional Information SSN: [id number] [address] LCSW

## 2019-11-01 NOTE — Evaluation (Addendum)
Physical Therapy Evaluation Patient Details Name: Nicole [name] MRN: [id number] DOB: 03/21/1950 Today's Date: 11/01/2019   History of Present Illness  Nicole [name]  is a 69 y.o. female, with history of thyroid nodule, seizures, schizophrenia, HTN, GERD, and Afib who presents to the ED at the recommendation of her PCP. Patient reports that she made an appt with PCP for chronic leg pain, and she was told to come to the ED for jaundice. Patient reports that she had not noticed any change in color at home. Patient does report that she is an alcoholic. She reports that she drinks too much too often. She quantifies that as several "glasses" or liquor every day. She reports that when she is "coming off of alcohol" she hallucinates. She is not able to report what her hallucinations are. Of note - she had been given ativan in the ED and is very drowsy at the time of my exam. She also reports that she has not been eating at home. This is because she doesn't want to have to "clean up" after a bowel movement.  Patient reports no known history of liver failure or hepatitis. She reports that she lives with her ex husband and her son. She is supposed to ambulate with a walker, but she has not been lately.     Clinical Impression  The patient performed supine to sit transfer min assist due to trunk weakness. She was able to tolerate sitting EOB without LOB but reported increased dizziness and lightheadedness. She performed sit to stand transfer mod assist, but required mod/max assist in order to maintain BLE weight acceptance. She said she felt she was dizzy and feared she may fall. Her O2 was 100% and bp was 98/68 at this time. After sitting EOB 1 minute, she stated she did not want to attempt another sit to stand because she was "scared she may fall." She said she hasn't stood up for the last 2 weeks out of fear that she may fall. Patient left in bed with call bell in arm's reach- RN notified. PLAN: The patient will  continue to benefit from skilled physical therapy services in hospital at recommended venue below in order to improve balance, gait, and ADL's to promote independence in functional activities.     Follow Up Recommendations SNF    Equipment Recommendations  Other (comment) (ramped entrance for home)    Recommendations for Other Services       Precautions / Restrictions Precautions Precautions: Fall Precaution Comments: fear w weight bearing Restrictions Weight Bearing Restrictions: No      Mobility  Bed Mobility Overal bed mobility: Needs Assistance Bed Mobility: Supine to Sit     Supine to sit: Min assist     General bed mobility comments: slow labored movement, trunk weakness resulting in need for min assist during transfer  Transfers Overall transfer level: Needs assistance Equipment used: Rolling walker (2 wheeled) Transfers: Sit to/from Stand;Anterior-Posterior Transfer Sit to Stand: Mod assist     Anterior-Posterior transfers: Mod assist   General transfer comment: pt able to support 50% body weight through BLE before reporting fear that she may fall; insists she sits back down  Ambulation/Gait                Stairs            Wheelchair Mobility    Modified Rankin (Stroke Patients Only)       Balance Overall balance assessment: Needs assistance Sitting-balance support: Bilateral upper extremity supported;Feet unsupported  Sitting balance-Leahy Scale: Fair Sitting balance - Comments: requiring min assist to maintain sitting balance, became slumped with increased time   Standing balance support: Bilateral upper extremity supported;During functional activity Standing balance-Leahy Scale: Poor Standing balance comment: standing tolerance for only 10 seconds before reporting that she may pass out and needed to sit down; BP 98/68 O2 100% at this time                             Pertinent Vitals/Pain Pain Assessment: No/denies pain     Home Living Family/patient expects to be discharged to:: Private residence Living Arrangements: Spouse/significant other;Children Available Help at Discharge:  (no help from son, minimal help from husband) Type of Home: House Home Access: Ramped entrance (reports ramp is too narrow, pt unable to navigate)     Home Layout: One level Home Equipment: Walker - 4 wheels;Cane - single point;Bedside commode Additional Comments: uses primarily rollator to get around     Prior Function Level of Independence: Needs assistance   Gait / Transfers Assistance Needed: rollator for household ambulation, but hasn't stood up in 2 weeks per patient report; independent w sponge baths, does not receive help from others for bathing, dressing independent  ADL's / Homemaking Assistance Needed: independent w sponge baths, does not receive help from others for bathing, dressing independent; husband does driving/shopping for her        Hand Dominance   Dominant Hand: Right    Extremity/Trunk Assessment   Upper Extremity Assessment Upper Extremity Assessment: Overall WFL for tasks assessed    Lower Extremity Assessment Lower Extremity Assessment: Generalized weakness    Cervical / Trunk Assessment Cervical / Trunk Assessment: Normal  Communication      Cognition Arousal/Alertness: Awake/alert Behavior During Therapy: WFL for tasks assessed/performed Overall Cognitive Status: Impaired/Different from baseline Area of Impairment: Attention;Memory (endorses confusion and memory issues)                               General Comments: increased fear w movement and ambulation; slow slurred speech w decreased attention      General Comments      Exercises     Assessment/Plan    PT Assessment Patient needs continued PT services  PT Problem List Decreased strength;Decreased range of motion;Decreased activity tolerance;Decreased balance;Decreased mobility;Decreased  coordination;Decreased cognition;Decreased knowledge of use of DME;Decreased safety awareness       PT Treatment Interventions DME instruction;Gait training;Functional mobility training;Therapeutic activities;Therapeutic exercise;Balance training;Cognitive remediation;Patient/family education    PT Goals (Current goals can be found in the Care Plan section)  Acute Rehab PT Goals Patient Stated Goal: go home after SNF, get stronger to be able to stand PT Goal Formulation: With patient Time For Goal Achievement: 10/15/20 Potential to Achieve Goals: Good    Frequency Min 3X/week   Barriers to discharge        Co-evaluation               AM-PAC PT "6 Clicks" Mobility  Outcome Measure Help needed turning from your back to your side while in a flat bed without using bedrails?: None Help needed moving from lying on your back to sitting on the side of a flat bed without using bedrails?: A Little Help needed moving to and from a bed to a chair (including a wheelchair)?: A Lot Help needed standing up from a chair using your arms (e.g.,  wheelchair or bedside chair)?: A Lot Help needed to walk in hospital room?: A Lot Help needed climbing 3-5 steps with a railing? : A Lot 6 Click Score: 15    End of Session Equipment Utilized During Treatment: Gait belt Activity Tolerance: Patient limited by fatigue;Other (comment) (limited by fear of BLE weight bearing) Patient left: in bed;with call bell/phone within reach Nurse Communication: Mobility status PT Visit Diagnosis: Unsteadiness on feet (R26.81);Other abnormalities of gait and mobility (R26.89);Repeated falls (R29.6);Muscle weakness (generalized) (M62.81);History of falling (Z91.81)    Time: 1447-1520 PT Time Calculation (min) (ACUTE ONLY): 33 min   Charges:   PT Evaluation $PT Eval Moderate Complexity: 1 Mod PT Treatments $Therapeutic Activity: 23-37 mins        3:32 PM , 11/01/19 Karlyn Agee, SPT Physical Therapy with  [facility] [phone] office  During this treatment session, the therapist was present, participating in and directing the treatment.  3:32 PM, 11/01/19 Lonell Grandchild, MPT Physical Therapist with [facility] [phone] office [phone] mobile phone

## 2019-11-01 NOTE — Plan of Care (Addendum)
  Problem: Acute Rehab PT Goals(only PT should resolve) Goal: Pt Will Go Supine/Side To Sit Outcome: Progressing Flowsheets (Taken 11/01/2019 1529) Pt will go Supine/Side to Sit: with modified independence Goal: Patient Will Transfer Sit To/From Stand Outcome: Progressing Flowsheets (Taken 11/01/2019 1529) Patient will transfer sit to/from stand: with minimal assist Goal: Pt Will Transfer Bed To Chair/Chair To Bed Outcome: Progressing Flowsheets (Taken 11/01/2019 1529) Pt will Transfer Bed to Chair/Chair to Bed: with min assist Goal: Pt Will Ambulate Outcome: Progressing Flowsheets (Taken 11/01/2019 1529) Pt will Ambulate: . 15 feet . with minimal assist . with rolling walker   3:29 PM , 11/01/19 Karlyn Agee, SPT Physical Therapy with [facility] [phone] office   During this treatment session, the therapist was present, participating in and directing the treatment.  3:33 PM, 11/01/19 Lonell Grandchild, MPT Physical Therapist with [facility] [phone] office [phone] mobile phone

## 2019-11-01 NOTE — ED Notes (Signed)
md notified of bp, requests manual bp, md updated about manual bp

## 2019-11-01 NOTE — TOC Initial Note (Addendum)
Transition of Care [facility]) - Initial/Assessment Note    Patient Details  Name: Nicole [name] MRN: [id number] Date of Birth: 02/11/1951  [facility]) CM/SW Contact:    Shade Flood, LCSW Phone Number: 11/01/2019, 3:14 PM  Clinical Narrative:                  Pt admitted from home. Received TOC consult for SA resource counseling. PT also recommending SNF rehab at dc.  Spoke with pt who is agreeable to SNF referrals. She lives with her ex husband and her son. She is usually fairly independent in ADLs prior to this admission.   Discussed CMS provider options and will refer as requested.   Pt's [employer] Medicare is not managed by [employer] and so the accepting SNF will need to do the notification/auth process.  SA treatment resources will be provided to pt prior to dc.  TOC will follow.  Expected Discharge Plan: Skilled Nursing Facility Barriers to Discharge: Continued Medical Work up, Ship broker   Patient Goals and CMS Choice Patient states their goals for this hospitalization and ongoing recovery are:: get better CMS Medicare.gov Compare Post Acute Care list provided to:: Patient Choice offered to / list presented to : Patient  Expected Discharge Plan and Services Expected Discharge Plan: [place] In-house Referral: Clinical Social Work   Post Acute Care Choice: [place] Living arrangements for the past 2 months: [place]                                      Prior Living Arrangements/Services Living arrangements for the past 2 months: Single Family Home Lives with:: Adult Children Patient language and need for interpreter reviewed:: Yes Do you feel safe going back to the place where you live?: Yes      Need for Family Participation in Patient Care: Yes (Comment) Care giver support system in place?: Yes (comment)   Criminal Activity/Legal Involvement Pertinent to Current Situation/Hospitalization: No  - Comment as needed  Activities of Daily Living Home Assistive Devices/Equipment: Walker (specify type), Eyeglasses ADL Screening (condition at time of admission) Patient's cognitive ability adequate to safely complete daily activities?: Yes Is the patient deaf or have difficulty hearing?: No Does the patient have difficulty seeing, even when wearing glasses/contacts?: No Does the patient have difficulty concentrating, remembering, or making decisions?: No Patient able to express need for assistance with ADLs?: Yes Does the patient have difficulty dressing or bathing?: No Independently performs ADLs?: No Communication: Independent Dressing (OT): Needs assistance Is this a change from baseline?: Change from baseline, expected to last <3days Grooming: Needs assistance Is this a change from baseline?: Change from baseline, expected to last <3 days Feeding: Independent Bathing: Needs assistance Is this a change from baseline?: Change from baseline, expected to last <3 days Toileting: Needs assistance Is this a change from baseline?: Change from baseline, expected to last <3 days In/Out Bed: Independent with device (comment) Walks in Home: Independent with device (comment) Does the patient have difficulty walking or climbing stairs?: Yes Weakness of Legs: Both Weakness of Arms/Hands: None  Permission Sought/Granted Permission sought to share information with : Facility Art therapist granted to share information with : Yes, Verbal Permission Granted     Permission granted to share info w AGENCY: local snfs        Emotional Assessment   Attitude/Demeanor/Rapport: Engaged Affect (typically observed): Pleasant  Orientation: : Oriented to Self, Oriented to Place, Oriented to  Time, Oriented to Situation Alcohol / Substance Use: Alcohol Use Psych Involvement: No (comment)  Admission diagnosis:  Acute liver failure [K72.00] Patient Active Problem List   Diagnosis  Date Noted  . Hepatic encephalopathy ([place]) 11/01/2019  . Hypotension 11/01/2019  . Jaundice 11/01/2019  . Hyperbilirubinemia 11/01/2019  . Hyperammonemia ([place]) 11/01/2019  . Alcoholic hepatitis 38/25/0539  . Acute liver failure 10/31/2019  . Acute respiratory failure with hypoxia ([place])   . Epigastric abdominal tenderness 12/18/2016  . Gastric mass 12/17/2016    Class: Acute  . Right renal mass 12/17/2016    Class: Acute  . Diverticulosis of sigmoid colon 12/17/2016  . Hiatal hernia 12/17/2016    Class: Acute  . Chronic alcoholism ([place]) 12/17/2016    Class: Chronic  . Chronic left shoulder pain 12/17/2016    Class: Chronic  . Arthritis 12/17/2016    Class: Chronic  . Hyponatremia 12/17/2016    Class: Acute  . Hypokalemia 12/17/2016    Class: Acute  . Anemia 12/17/2016  . Atrial fibrillation with rapid ventricular response ([place]) 12/17/2016    Class: Acute   PCP:  Rosita Fire, MD Pharmacy:   [place], [place] - [address] [facility]. [address] [place] [address] Phone: [phone] Fax: [phone]  [id number] [place], [place] [address] [place] [address] Phone: [phone] Fax: [phone]     Social Determinants of Health (SDOH) Interventions    Readmission Risk Interventions Readmission Risk Prevention Plan 11/01/2019  Transportation Screening Complete  Medication Review (RN CM) Complete  Some recent data might be hidden

## 2019-11-02 ENCOUNTER — Inpatient Hospital Stay (HOSPITAL_COMMUNITY): Payer: Medicare HMO

## 2019-11-02 ENCOUNTER — Inpatient Hospital Stay: Payer: Self-pay

## 2019-11-02 ENCOUNTER — Other Ambulatory Visit: Payer: Self-pay

## 2019-11-02 DIAGNOSIS — M1990 Unspecified osteoarthritis, unspecified site: Secondary | ICD-10-CM

## 2019-11-02 DIAGNOSIS — N179 Acute kidney failure, unspecified: Secondary | ICD-10-CM

## 2019-11-02 DIAGNOSIS — K7010 Alcoholic hepatitis without ascites: Secondary | ICD-10-CM

## 2019-11-02 LAB — CBC
HCT: 26 % — ABNORMAL LOW (ref 36.0–46.0)
Hemoglobin: 8.1 g/dL — ABNORMAL LOW (ref 12.0–15.0)
MCH: 32.8 pg (ref 26.0–34.0)
MCHC: 31.2 g/dL (ref 30.0–36.0)
MCV: 105.3 fL — ABNORMAL HIGH (ref 80.0–100.0)
Platelets: 102 10*3/uL — ABNORMAL LOW (ref 150–400)
RBC: 2.47 MIL/uL — ABNORMAL LOW (ref 3.87–5.11)
RDW: 15.9 % — ABNORMAL HIGH (ref 11.5–15.5)
WBC: 5.5 10*3/uL (ref 4.0–10.5)
nRBC: 0 % (ref 0.0–0.2)

## 2019-11-02 LAB — COMPREHENSIVE METABOLIC PANEL
ALT: 65 U/L — ABNORMAL HIGH (ref 0–44)
AST: 88 U/L — ABNORMAL HIGH (ref 15–41)
Albumin: 2.2 g/dL — ABNORMAL LOW (ref 3.5–5.0)
Alkaline Phosphatase: 337 U/L — ABNORMAL HIGH (ref 38–126)
Anion gap: 12 (ref 5–15)
BUN: 33 mg/dL — ABNORMAL HIGH (ref 8–23)
CO2: 19 mmol/L — ABNORMAL LOW (ref 22–32)
Calcium: 8 mg/dL — ABNORMAL LOW (ref 8.9–10.3)
Chloride: 96 mmol/L — ABNORMAL LOW (ref 98–111)
Creatinine, Ser: 1.73 mg/dL — ABNORMAL HIGH (ref 0.44–1.00)
GFR calc Af Amer: 34 mL/min — ABNORMAL LOW (ref >60)
GFR calc non Af Amer: 29 mL/min — ABNORMAL LOW (ref >60)
Glucose, Bld: 100 mg/dL — ABNORMAL HIGH (ref 70–99)
Potassium: 3.3 mmol/L — ABNORMAL LOW (ref 3.5–5.1)
Sodium: 127 mmol/L — ABNORMAL LOW (ref 135–145)
Total Bilirubin: 15.3 mg/dL — ABNORMAL HIGH (ref 0.3–1.2)
Total Protein: 4.9 g/dL — ABNORMAL LOW (ref 6.5–8.1)

## 2019-11-02 LAB — VITAMIN D 25 HYDROXY (VIT D DEFICIENCY, FRACTURES): Vit D, 25-Hydroxy: 20.5 ng/mL — ABNORMAL LOW (ref 30–100)

## 2019-11-02 LAB — MAGNESIUM: Magnesium: 2 mg/dL (ref 1.7–2.4)

## 2019-11-02 LAB — PROTIME-INR
INR: 1.7 — ABNORMAL HIGH (ref 0.8–1.2)
Prothrombin Time: 19 seconds — ABNORMAL HIGH (ref 11.4–15.2)

## 2019-11-02 LAB — AMMONIA: Ammonia: 71 umol/L — ABNORMAL HIGH (ref 9–35)

## 2019-11-02 LAB — LIPASE, BLOOD: Lipase: 122 U/L — ABNORMAL HIGH (ref 11–51)

## 2019-11-02 MED ORDER — PREDNISONE 20 MG PO TABS
40.0000 mg | ORAL_TABLET | Freq: Every day | ORAL | Status: DC
  Administered 2019-11-02 – 2019-11-09 (×8): 40 mg via ORAL
  Filled 2019-11-02 (×8): qty 2

## 2019-11-02 MED ORDER — PREDNISOLONE 5 MG PO TABS: 30.0000 mg | ORAL_TABLET | Freq: Every day | ORAL | Status: DC

## 2019-11-02 MED ORDER — CHLORHEXIDINE GLUCONATE CLOTH 2 % EX PADS
6.0000 | MEDICATED_PAD | Freq: Every day | CUTANEOUS | Status: DC
  Administered 2019-11-02 – 2019-11-09 (×4): 6 via TOPICAL

## 2019-11-02 MED ORDER — POTASSIUM CHLORIDE 10 MEQ/100ML IV SOLN
10.0000 meq | INTRAVENOUS | Status: AC
  Administered 2019-11-02 (×3): 10 meq via INTRAVENOUS
  Filled 2019-11-02 (×3): qty 100

## 2019-11-02 MED ORDER — SODIUM CHLORIDE 0.9% FLUSH
10.0000 mL | Freq: Two times a day (BID) | INTRAVENOUS | Status: DC
  Administered 2019-11-02 – 2019-11-09 (×7): 10 mL

## 2019-11-02 MED ORDER — SODIUM CHLORIDE 0.9% FLUSH: 10.0000 mL | INTRAVENOUS | Status: DC | PRN

## 2019-11-02 MED ORDER — POTASSIUM CHLORIDE 10 MEQ/100ML IV SOLN: 10.0000 meq | INTRAVENOUS | Status: AC

## 2019-11-02 MED ORDER — LACTULOSE 10 GM/15ML PO SOLN
10.0000 g | Freq: Every day | ORAL | Status: DC
  Administered 2019-11-02 – 2019-11-04 (×3): 10 g via ORAL
  Filled 2019-11-02 (×3): qty 30

## 2019-11-02 NOTE — Progress Notes (Addendum)
Patient without complaints aside from being hungry. Breakfast tray in the corner of her room (untouched as of 1100 hrs. when I came to see her)   Vital signs in last 24 hours: Temp:  [96.9 F (36.1 C)-97.9 F (36.6 C)] 96.9 F (36.1 C) (09/18 0547) Pulse Rate:  [64-95] 90 (09/18 0547) Resp:  [12-19] 17 (09/18 0547) BP: (79-110)/(55-78) 92/55 (09/18 0547) SpO2:  [99 %-100 %] 99 % (09/18 0547) Weight:  [87 kg] 87 kg (09/18 0447) Last BM Date: 11/01/19 General:   Appears somewhat lethargic.  Responsive knows her location and day of the week.  No asterixis apparent. Abdomen: Nondistended.  Positive bowel sounds.  Soft and nontender without obvious mass organomegaly extremities without clubbing or edema.    Intake/Output from previous day: 09/17 0701 - 09/18 0700 In: 1478.2 [I.V.:1228.2; IV Piggyback:250] Out: -  Intake/Output this shift: No intake/output data recorded.  Lab Results: Recent Labs    10/31/19 1636 11/01/19 0328 11/02/19 0751  WBC 6.7 5.6 5.5  HGB 10.0* 9.0* 8.1*  HCT 29.8* 26.8* 26.0*  PLT 142* 121* 102*   BMET Recent Labs    10/31/19 1636 11/01/19 0328 11/02/19 0751  NA 123* 123* 127*  K 4.1 3.6 3.3*  CL 89* 93* 96*  CO2 21* 20* 19*  GLUCOSE 81 70 100*  BUN 30* 30* 33*  CREATININE 1.88* 1.93* 1.73*  CALCIUM 8.0* 7.7* 8.0*   LFT Recent Labs    11/01/19 0328 11/01/19 1214 11/02/19 0751  PROT 4.9*  --  4.9*  ALBUMIN 1.5*   < > 2.2*  AST 96*  --  88*  ALT 85*  --  65*  ALKPHOS 428*  --  337*  BILITOT 8.9*  9.0*  --  15.3*  BILIDIR 5.6*  --   --   IBILI 3.3*  --   --    < > = values in this interval not displayed.   PT/INR Recent Labs    10/31/19 1636 11/02/19 0751  LABPROT 18.2* 19.0*  INR 1.6* 1.7*   Ammonia 71  Hepatitis Panel Recent Labs    10/31/19 1636  HEPBSAG NON REACTIVE  HCVAB NON REACTIVE  HEPAIGM NON REACTIVE  HEPBIGM NON REACTIVE   C-Diff No results for input(s): CDIFFTOX in the last 72  hours.  Studies/Results: CT ABDOMEN PELVIS WO CONTRAST  Result Date: 10/31/2019 CLINICAL DATA:  Weakness, diarrhea, jaundice, diffuse abdominal pain EXAM: CT ABDOMEN AND PELVIS WITHOUT CONTRAST TECHNIQUE: Multidetector CT imaging of the abdomen and pelvis was performed following the standard protocol without IV contrast. COMPARISON:  12/24/2016, 08/05/2019 FINDINGS: Lower chest: There is a large hiatal hernia containing the majority of the stomach. Trace left pleural effusion with compressive atelectasis in the left lower lobe. The right lung base is clear. Hepatobiliary: There is severe hepatic steatosis again noted, unchanged. No focal liver abnormality. Gallbladder is unremarkable. No biliary dilation. Pancreas: Pancreas is atrophic without focal abnormality. The complex fluid collection between the pancreatic tail and the left lobe liver on prior study has resolved in the interim. Spleen: Normal in size without focal abnormality. Adrenals/Urinary Tract: The right kidney and adrenal gland are surgically absent. Left kidney and adrenal are unremarkable. No urinary tract calculi or obstruction. Bladder is decompressed without focal abnormality. Stomach/Bowel: There is diffuse colonic diverticulosis without diverticulitis. No bowel obstruction or ileus. Normal appendix right lower quadrant. Vascular/Lymphatic: Aortic atherosclerosis. No enlarged abdominal or pelvic lymph nodes. Reproductive: Uterus and bilateral adnexa are unremarkable. Other: Trace simple appearing free fluid  in the bilateral upper quadrants and lower pelvis. No free intraperitoneal gas. No abdominal wall hernia. Musculoskeletal: No acute or destructive bony lesions. Reconstructed images demonstrate no additional findings. IMPRESSION: 1. Interval resolution of the complex fluid collection between the pancreatic tail and left lobe liver. 2. Stable severe hepatic steatosis. 3. Large hiatal hernia. 4. Trace simple appearing free fluid in the  bilateral upper quadrants and lower pelvis. 5. Diffuse colonic diverticulosis without diverticulitis. 6. Aortic Atherosclerosis (ICD10-I70.0). Electronically Signed   By: Randa Ngo M.D.   On: 10/31/2019 22:22   US Venous Img Lower Bilateral (DVT)  Result Date: 11/01/2019 CLINICAL DATA:  Lower extremity edema. EXAM: BILATERAL LOWER EXTREMITY VENOUS DOPPLER ULTRASOUND TECHNIQUE: Gray-scale sonography with graded compression, as well as color Doppler and duplex ultrasound were performed to evaluate the lower extremity deep venous systems from the level of the common femoral vein and including the common femoral, femoral, profunda femoral, popliteal and calf veins including the posterior tibial, peroneal and gastrocnemius veins when visible. The superficial great saphenous vein was also interrogated. Spectral Doppler was utilized to evaluate flow at rest and with distal augmentation maneuvers in the common femoral, femoral and popliteal veins. COMPARISON:  None. FINDINGS: RIGHT LOWER EXTREMITY Common Femoral Vein: No evidence of thrombus. Normal compressibility, respiratory phasicity and response to augmentation. Saphenofemoral Junction: No evidence of thrombus. Normal compressibility and flow on color Doppler imaging. Profunda Femoral Vein: No evidence of thrombus. Normal compressibility and flow on color Doppler imaging. Femoral Vein: No evidence of thrombus. Normal compressibility, respiratory phasicity and response to augmentation. Popliteal Vein: No evidence of thrombus. Normal compressibility, respiratory phasicity and response to augmentation. Calf Veins: No evidence of thrombus. Normal compressibility and flow on color Doppler imaging. Superficial Great Saphenous Vein: No evidence of thrombus. Normal compressibility. Venous Reflux:  None. Other Findings:  None. LEFT LOWER EXTREMITY Common Femoral Vein: No evidence of thrombus. Normal compressibility, respiratory phasicity and response to augmentation.  Saphenofemoral Junction: No evidence of thrombus. Normal compressibility and flow on color Doppler imaging. Profunda Femoral Vein: No evidence of thrombus. Normal compressibility and flow on color Doppler imaging. Femoral Vein: No evidence of thrombus. Normal compressibility, respiratory phasicity and response to augmentation. Popliteal Vein: No evidence of thrombus. Normal compressibility, respiratory phasicity and response to augmentation. Calf Veins: No evidence of thrombus. Normal compressibility and flow on color Doppler imaging. Superficial Great Saphenous Vein: No evidence of thrombus. Normal compressibility. Venous Reflux:  None. Other Findings:  None. IMPRESSION: No evidence of deep venous thrombosis in either lower extremity. Electronically Signed   By: Kerby Moors M.D.   On: 11/01/2019 10:06   US Abdomen Limited RUQ  Result Date: 11/01/2019 CLINICAL DATA:  Acute liver failure. EXAM: ULTRASOUND ABDOMEN LIMITED RIGHT UPPER QUADRANT COMPARISON:  CT abdomen pelvis, 10/31/2019 FINDINGS: Gallbladder: No gallstones or wall thickening visualized. No sonographic Murphy sign noted by sonographer. Common bile duct: Diameter: 7 mm Liver: Marked increase in liver parenchymal echogenicity which leads to poor through transmission of the sound beam. Liver assessment is somewhat limited due to the poor penetration. No defined liver mass. Portal vein is patent on color Doppler imaging with normal direction of blood flow towards the liver. Other: Small amount of ascites noted along the inferior liver margin. IMPRESSION: 1. Marked increased liver parenchymal echogenicity consistent with extensive hepatic steatosis, which was also evident on the previous day's CT. 2. No acute findings. Electronically Signed   By: Lajean Manes M.D.   On: 11/01/2019 10:13    Impression: 69 year old  lady with alcohol abuse admitted with alcoholic hepatitis, secondary hepatic encephalopathy and electrolyte derangement. Chronic  malnutrition. Chronic kidney disease with modest improvement in creatinine.  Receiving IV albumin  Serological markers for chronic viral hepatitis are negative.  Clinically, no other signs of active infection.  Doubt HRS at this time. I reviewed the medical record.  Confirmed with nursing staff patient is NOT on lactulose. She remains encephalopathic.  She did receive 2 g of Ativan early this a.m. for agitation. Mildly elevated lipase.  Clinically, no significant pancreatitis. Discriminant function now 64 -suggests she would benefit from prednisolone therapy  Recommendations: Continue supportive measures.  I discussed with nursing staff need to assist patient with meals. Agree with albumin for 2 more days. Initiate therapy with lactulose-15 cc daily-titrate to 3-4 semiformed stools daily. Initiate therapy with prednisolone 40 mg daily for the next 30 days.

## 2019-11-02 NOTE — Progress Notes (Addendum)
Peripherally Inserted Central Catheter Placement  The IV Nurse has discussed with the patient and/or persons authorized to consent for the patient, the purpose of this procedure and the potential benefits and risks involved with this procedure.  The benefits include less needle sticks, lab draws from the catheter, and the patient may be discharged home with the catheter. Risks include, but not limited to, infection, bleeding, blood clot (thrombus formation), and puncture of an artery; nerve damage and irregular heartbeat and possibility to perform a PICC exchange if needed/ordered by physician.  Alternatives to this procedure were also discussed.  Bard Power PICC patient education guide, fact sheet on infection prevention and patient information card has been provided to patient /or left at bedside.    PICC Placement Documentation  PICC Single Lumen 18/40/37 PICC Right Basilic 36 cm 0 cm (Active)  Indication for Insertion or Continuance of Line Limited venous access - need for IV therapy >5 days (PICC only) 11/02/19 1849  Exposed Catheter (cm) 0 cm 11/02/19 1849  Site Assessment Clean;Dry;Intact 11/02/19 1849  Line Status Flushed;Saline locked;Blood return noted 11/02/19 1849  Dressing Type Transparent;Securing device 11/02/19 1849  Dressing Status Clean;Dry;Intact;Antimicrobial disc in place 11/02/19 [place] Not Applicable 54/36/06 7703  Dressing Change Due 11/09/19 11/02/19 1849    Additional picc tray had to be used due to guide wire being cut    Lesle Reek 11/02/2019, 6:51 PM

## 2019-11-02 NOTE — TOC Progression Note (Addendum)
Transition of Care [facility]) - Progression Note   Patient Details  Name: Nicole [name] MRN: [id number] Date of Birth: 1950-06-01  Transition of Care [facility]) CM/SW [place], LCSW Phone Number: 11/02/2019, 3:06 PM  Clinical Narrative: CSW unable to review bed offers with patient, but patient was too lethargic and somewhat disoriented. CSW attempted to call patient's ex-husband, Nicole [name], but was unable to reach him so CSW left voicemail requesting call back. CSW spoke with patient's son, Nicole [name], regarding bed offers. Son reported he thinks [facility] would be preferred as it is closer than the other bed offers, but was unsure without discussing it with the patient and patient's ex-husband.  Addendum: 3:32pm-CSW received return call from [facility]. Nicole [name] requested Nicole [name] as it is closest to the family. Bed offer accepted in Epic. Debbie with [facility] updated.  Expected Discharge Plan: [place] Barriers to Discharge: Continued Medical Work up, Ship broker  Expected Discharge Plan and Services Expected Discharge Plan: [place] In-house Referral: Clinical Social Work Post Acute Care Choice: [place] Living arrangements for the past 2 months: Single Family Home  Readmission Risk Interventions Readmission Risk Prevention Plan 11/01/2019  Transportation Screening Complete  Medication Review (RN CM) Complete  Some recent data might be hidden

## 2019-11-02 NOTE — Progress Notes (Addendum)
PROGRESS NOTE   Nicole [name]  [id number] DOB: 21-Nov-1950 DOA: 10/31/2019 PCP: Rosita Fire, MD   Chief Complaint  Patient presents with  . Jaundice   Brief Admission History:  69 y.o. female, with history of thyroid nodule, seizures, schizophrenia, HTN, GERD, and Afib who presents to the ED at the recommendation of her PCP. Patient reports that she made an appt with PCP for chronic leg pain, and she was told to come to the ED for jaundice. Patient reports that she had not noticed any change in color at home. Patient does report that she is an alcoholic. She reports that she drinks too much too often.  She was admitted with jaundice and findings of alcoholic hepatitis.   Assessment & Plan:   Principal Problem:   Alcoholic hepatitis Active Problems:   Diverticulosis of sigmoid colon   Hiatal hernia   Chronic alcoholism (HCC)   Arthritis   Hyponatremia   Epigastric abdominal tenderness   Acute liver failure   Hepatic encephalopathy (HCC)   Hypotension   Jaundice   Hyperbilirubinemia   Hyperammonemia (HCC)  1. Alcoholic hepatitis - Pt presented with jaundice and elevated LFTs and markedly elevated bilirubin. She is being treated supportively. GI consult requested.  Acute hepatitis panel negative and GI started on steroids.  Palliative medicine consult requested for goals of care.  2. Hyponatremia - improved slightly with hydration, secondary to chronic liver disease, following.  3. Hepatic encephalopathy - ammonia is being followed, GI starting lactulose.   4. Abdominal pain - generalized - follow up lipase level. No evidence of biliary obstruction on [place] or CT.  5. Right shoulder pain - xray right shoulder.   DVT prophylaxis: SCDs Code Status: Full  Family Communication:  Disposition:  SNF   Status is: Inpatient  Remains inpatient appropriate because:IV treatments appropriate due to intensity of illness or inability to take PO and Inpatient level of care appropriate due  to severity of illness  Dispo: The patient is from: Home              Anticipated d/c is to: SNF              Anticipated d/c date is: 3 days              Patient currently is not medically stable to d/c.  Consultants:   GI   Procedures:     Antimicrobials:     Subjective: Pt somnolent, was medicated with lorazepam overnight (2mg ) c/o right shoulder pain  Objective: Vitals:   11/01/19 2105 11/02/19 0447 11/02/19 0547 11/02/19 1458  BP: (!) 90/56  (!) 92/55 102/63  Pulse: 87  90 98  Resp: 18  17 18   Temp: 97.9 F (36.6 C)  (!) 96.9 F (36.1 C) 97.9 F (36.6 C)  TempSrc: Oral     SpO2: 100%  99% 95%  Weight:  87 kg    Height:        Intake/Output Summary (Last 24 hours) at 11/02/2019 1717 Last data filed at 11/02/2019 1558 Gross per 24 hour  Intake 1586.04 ml  Output --  Net 1586.04 ml   Examination:  General exam: chronically ill appearing, diffuse jaundice and scleral icterus (worse today than yesterday), somnolent, confused.  Respiratory system:  Respiratory effort normal. Cardiovascular system: normal S1 & S2 heard. No JVD, murmurs, rubs, gallops or clicks. 1+ pedal edema. Gastrointestinal system: Abdomen is nondistended, soft and epigastric tenderness with no guarding. No organomegaly or masses felt. Normal bowel sounds  heard. Central nervous system: oriented to person. No focal neurological deficits. Extremities: no gross abnormalities.  Skin: No rashes, lesions or ulcers Psychiatry: flat affect.   Data Reviewed: I have personally reviewed following labs and imaging studies  CBC: Recent Labs  Lab 10/31/19 1636 11/01/19 0328 11/02/19 0751  WBC 6.7 5.6 5.5  NEUTROABS 5.4  --   --   HGB 10.0* 9.0* 8.1*  HCT 29.8* 26.8* 26.0*  MCV 98.3 99.6 105.3*  PLT 142* 121* 102*    Basic Metabolic Panel: Recent Labs  Lab 10/31/19 1636 11/01/19 0328 11/02/19 0751  NA 123* 123* 127*  K 4.1 3.6 3.3*  CL 89* 93* 96*  CO2 21* 20* 19*  GLUCOSE 81 70 100*   BUN 30* 30* 33*  CREATININE 1.88* 1.93* 1.73*  CALCIUM 8.0* 7.7* 8.0*  MG  --  1.6* 2.0  PHOS  --  3.7  --     GFR: Estimated Creatinine Clearance: 34.9 mL/min (A) (by C-G formula based on SCr of 1.73 mg/dL (H)).  Liver Function Tests: Recent Labs  Lab 10/31/19 1636 11/01/19 0328 11/01/19 1214 11/02/19 0751  AST 103* 96*  --  88*  ALT 96* 85*  --  65*  ALKPHOS 488* 428*  --  337*  BILITOT 9.0* 8.9*  9.0*  --  15.3*  PROT 5.5* 4.9*  --  4.9*  ALBUMIN 1.8* 1.5* 1.7* 2.2*    CBG: No results for input(s): GLUCAP in the last 168 hours.  Recent Results (from the past 240 hour(s))  SARS Coronavirus 2 by RT PCR (hospital order, performed in [facility] hospital lab) Nasopharyngeal Nasopharyngeal Swab     Status: None   Collection Time: 10/31/19 11:20 PM   Specimen: Nasopharyngeal Swab  Result Value Ref Range Status   SARS Coronavirus 2 NEGATIVE NEGATIVE Final    Comment: (NOTE) SARS-CoV-2 target nucleic acids are NOT DETECTED.  The SARS-CoV-2 RNA is generally detectable in upper and lower respiratory specimens during the acute phase of infection. The lowest concentration of SARS-CoV-2 viral copies this assay can detect is 250 copies / mL. A negative result does not preclude SARS-CoV-2 infection and should not be used as the sole basis for treatment or other patient management decisions.  A negative result may occur with improper specimen collection / handling, submission of specimen other than nasopharyngeal swab, presence of viral mutation(s) within the areas targeted by this assay, and inadequate number of viral copies (<250 copies / mL). A negative result must be combined with clinical observations, patient history, and epidemiological information.  Fact Sheet for Patients:   StrictlyIdeas.no  Fact Sheet for Healthcare Providers: BankingDealers.co.za  This test is not yet approved or  cleared by the [place] FDA  and has been authorized for detection and/or diagnosis of SARS-CoV-2 by FDA under an Emergency Use Authorization (EUA).  This EUA will remain in effect (meaning this test can be used) for the duration of the COVID-19 declaration under Section 564(b)(1) of the Act, 21 U.S.C. section 360bbb-3(b)(1), unless the authorization is terminated or revoked sooner.  Performed at [facility], [address] [place], [place] [address]      Radiology Studies: CT ABDOMEN PELVIS WO CONTRAST  Result Date: 10/31/2019 CLINICAL DATA:  Weakness, diarrhea, jaundice, diffuse abdominal pain EXAM: CT ABDOMEN AND PELVIS WITHOUT CONTRAST TECHNIQUE: Multidetector CT imaging of the abdomen and pelvis was performed following the standard protocol without IV contrast. COMPARISON:  12/24/2016, 08/05/2019 FINDINGS: Lower chest: There is a large hiatal hernia  containing the majority of the stomach. Trace left pleural effusion with compressive atelectasis in the left lower lobe. The right lung base is clear. Hepatobiliary: There is severe hepatic steatosis again noted, unchanged. No focal liver abnormality. Gallbladder is unremarkable. No biliary dilation. Pancreas: Pancreas is atrophic without focal abnormality. The complex fluid collection between the pancreatic tail and the left lobe liver on prior study has resolved in the interim. Spleen: Normal in size without focal abnormality. Adrenals/Urinary Tract: The right kidney and adrenal gland are surgically absent. Left kidney and adrenal are unremarkable. No urinary tract calculi or obstruction. Bladder is decompressed without focal abnormality. Stomach/Bowel: There is diffuse colonic diverticulosis without diverticulitis. No bowel obstruction or ileus. Normal appendix right lower quadrant. Vascular/Lymphatic: Aortic atherosclerosis. No enlarged abdominal or pelvic lymph nodes. Reproductive: Uterus and bilateral adnexa are unremarkable. Other: Trace simple appearing free fluid in the  bilateral upper quadrants and lower pelvis. No free intraperitoneal gas. No abdominal wall hernia. Musculoskeletal: No acute or destructive bony lesions. Reconstructed images demonstrate no additional findings. IMPRESSION: 1. Interval resolution of the complex fluid collection between the pancreatic tail and left lobe liver. 2. Stable severe hepatic steatosis. 3. Large hiatal hernia. 4. Trace simple appearing free fluid in the bilateral upper quadrants and lower pelvis. 5. Diffuse colonic diverticulosis without diverticulitis. 6. Aortic Atherosclerosis (ICD10-I70.0). Electronically Signed   By: Randa Ngo M.D.   On: 10/31/2019 22:22   US Venous Img Lower Bilateral (DVT)  Result Date: 11/01/2019 CLINICAL DATA:  Lower extremity edema. EXAM: BILATERAL LOWER EXTREMITY VENOUS DOPPLER ULTRASOUND TECHNIQUE: Gray-scale sonography with graded compression, as well as color Doppler and duplex ultrasound were performed to evaluate the lower extremity deep venous systems from the level of the common femoral vein and including the common femoral, femoral, profunda femoral, popliteal and calf veins including the posterior tibial, peroneal and gastrocnemius veins when visible. The superficial great saphenous vein was also interrogated. Spectral Doppler was utilized to evaluate flow at rest and with distal augmentation maneuvers in the common femoral, femoral and popliteal veins. COMPARISON:  None. FINDINGS: RIGHT LOWER EXTREMITY Common Femoral Vein: No evidence of thrombus. Normal compressibility, respiratory phasicity and response to augmentation. Saphenofemoral Junction: No evidence of thrombus. Normal compressibility and flow on color Doppler imaging. Profunda Femoral Vein: No evidence of thrombus. Normal compressibility and flow on color Doppler imaging. Femoral Vein: No evidence of thrombus. Normal compressibility, respiratory phasicity and response to augmentation. Popliteal Vein: No evidence of thrombus. Normal  compressibility, respiratory phasicity and response to augmentation. Calf Veins: No evidence of thrombus. Normal compressibility and flow on color Doppler imaging. Superficial Great Saphenous Vein: No evidence of thrombus. Normal compressibility. Venous Reflux:  None. Other Findings:  None. LEFT LOWER EXTREMITY Common Femoral Vein: No evidence of thrombus. Normal compressibility, respiratory phasicity and response to augmentation. Saphenofemoral Junction: No evidence of thrombus. Normal compressibility and flow on color Doppler imaging. Profunda Femoral Vein: No evidence of thrombus. Normal compressibility and flow on color Doppler imaging. Femoral Vein: No evidence of thrombus. Normal compressibility, respiratory phasicity and response to augmentation. Popliteal Vein: No evidence of thrombus. Normal compressibility, respiratory phasicity and response to augmentation. Calf Veins: No evidence of thrombus. Normal compressibility and flow on color Doppler imaging. Superficial Great Saphenous Vein: No evidence of thrombus. Normal compressibility. Venous Reflux:  None. Other Findings:  None. IMPRESSION: No evidence of deep venous thrombosis in either lower extremity. Electronically Signed   By: Kerby Moors M.D.   On: 11/01/2019 10:06   [place] EKG SITE  RITE  Result Date: 11/02/2019 If Site Rite image not attached, placement could not be confirmed due to current cardiac rhythm.  US Abdomen Limited RUQ  Result Date: 11/01/2019 CLINICAL DATA:  Acute liver failure. EXAM: ULTRASOUND ABDOMEN LIMITED RIGHT UPPER QUADRANT COMPARISON:  CT abdomen pelvis, 10/31/2019 FINDINGS: Gallbladder: No gallstones or wall thickening visualized. No sonographic Murphy sign noted by sonographer. Common bile duct: Diameter: 7 mm Liver: Marked increase in liver parenchymal echogenicity which leads to poor through transmission of the sound beam. Liver assessment is somewhat limited due to the poor penetration. No defined liver mass. Portal  vein is patent on color Doppler imaging with normal direction of blood flow towards the liver. Other: Small amount of ascites noted along the inferior liver margin. IMPRESSION: 1. Marked increased liver parenchymal echogenicity consistent with extensive hepatic steatosis, which was also evident on the previous day's CT. 2. No acute findings. Electronically Signed   By: Lajean Manes M.D.   On: 11/01/2019 10:13   Scheduled Meds: . ferrous sulfate  325 mg Oral Q breakfast  . folic acid  1 mg Oral Daily  . heparin  5,000 Units Subcutaneous Q8H  . lactulose  10 g Oral Daily  . LORazepam  0-4 mg Intravenous Q6H   Followed by  . [START ON 11/03/2019] LORazepam  0-4 mg Intravenous Q12H  . metoprolol tartrate  50 mg Oral BID  . multivitamin with minerals  1 tablet Oral Daily  . predniSONE  40 mg Oral Daily  . thiamine  100 mg Oral Daily   Or  . thiamine  100 mg Intravenous Daily   Continuous Infusions: . sodium chloride Stopped (11/02/19 0818)  . albumin human Stopped (11/02/19 0844)  . potassium chloride      LOS: 2 days   Time spent: 33 mins  Ariyah Sedlack Wynetta Emery, MD How to contact the [facility] Attending or Consulting provider [place] or covering provider during after hours [place], for this patient?  1. Check the care team in [facility] and look for a) attending/consulting [facility] provider listed and b) the [facility] team listed 2. Log into [email or web address] and use [place]'s universal password to access. If you do not have the password, please contact the hospital operator. 3. Locate the [facility] provider you are looking for under [facility] and page to a number that you can be directly reached. 4. If you still have difficulty reaching the provider, please page the [facility] (Director on Call) for the Hospitalists listed on amion for assistance.  11/02/2019, 5:17 PM

## 2019-11-03 DIAGNOSIS — K7200 Acute and subacute hepatic failure without coma: Principal | ICD-10-CM

## 2019-11-03 LAB — COMPREHENSIVE METABOLIC PANEL
ALT: 62 U/L — ABNORMAL HIGH (ref 0–44)
AST: 94 U/L — ABNORMAL HIGH (ref 15–41)
Albumin: 2.3 g/dL — ABNORMAL LOW (ref 3.5–5.0)
Alkaline Phosphatase: 318 U/L — ABNORMAL HIGH (ref 38–126)
Anion gap: 14 (ref 5–15)
BUN: 35 mg/dL — ABNORMAL HIGH (ref 8–23)
CO2: 16 mmol/L — ABNORMAL LOW (ref 22–32)
Calcium: 8.4 mg/dL — ABNORMAL LOW (ref 8.9–10.3)
Chloride: 98 mmol/L (ref 98–111)
Creatinine, Ser: 1.35 mg/dL — ABNORMAL HIGH (ref 0.44–1.00)
GFR calc Af Amer: 46 mL/min — ABNORMAL LOW (ref >60)
GFR calc non Af Amer: 40 mL/min — ABNORMAL LOW (ref >60)
Glucose, Bld: 137 mg/dL — ABNORMAL HIGH (ref 70–99)
Potassium: 4 mmol/L (ref 3.5–5.1)
Sodium: 128 mmol/L — ABNORMAL LOW (ref 135–145)
Total Bilirubin: 20.3 mg/dL (ref 0.3–1.2)
Total Protein: 5.2 g/dL — ABNORMAL LOW (ref 6.5–8.1)

## 2019-11-03 LAB — CBC
HCT: 22.2 % — ABNORMAL LOW (ref 36.0–46.0)
Hemoglobin: 7 g/dL — ABNORMAL LOW (ref 12.0–15.0)
MCH: 32.9 pg (ref 26.0–34.0)
MCHC: 31.5 g/dL (ref 30.0–36.0)
MCV: 104.2 fL — ABNORMAL HIGH (ref 80.0–100.0)
Platelets: 96 10*3/uL — ABNORMAL LOW (ref 150–400)
RBC: 2.13 MIL/uL — ABNORMAL LOW (ref 3.87–5.11)
RDW: 16.1 % — ABNORMAL HIGH (ref 11.5–15.5)
WBC: 4.3 10*3/uL (ref 4.0–10.5)
nRBC: 0 % (ref 0.0–0.2)

## 2019-11-03 LAB — MAGNESIUM: Magnesium: 2.1 mg/dL (ref 1.7–2.4)

## 2019-11-03 LAB — LIPASE, BLOOD: Lipase: 78 U/L — ABNORMAL HIGH (ref 11–51)

## 2019-11-03 LAB — PROTIME-INR
INR: 1.4 — ABNORMAL HIGH (ref 0.8–1.2)
Prothrombin Time: 16.5 seconds — ABNORMAL HIGH (ref 11.4–15.2)

## 2019-11-03 LAB — PREPARE RBC (CROSSMATCH)

## 2019-11-03 LAB — ABO/RH: ABO/RH(D): B POS

## 2019-11-03 MED ORDER — ALBUMIN HUMAN 25 % IV SOLN
50.0000 g | Freq: Once | INTRAVENOUS | Status: AC
  Administered 2019-11-03: 50 g via INTRAVENOUS
  Filled 2019-11-03: qty 200

## 2019-11-03 MED ORDER — SODIUM CHLORIDE 0.9% IV SOLUTION: Freq: Once | INTRAVENOUS | Status: DC

## 2019-11-03 NOTE — Progress Notes (Signed)
   11/03/19 0437  Assess: MEWS Score  Temp 97.8 F (36.6 C)  BP 124/77  Pulse Rate (!) 104  Resp 20  SpO2 98 %  O2 Device Room Air  Assess: MEWS Score  MEWS Temp 0  MEWS Systolic 0  MEWS Pulse 1  MEWS RR 0  MEWS LOC 1  MEWS Score 2  MEWS Score Color Yellow  Assess: if the MEWS score is Yellow or Red  Were vital signs taken at a resting state? Yes  Focused Assessment No change from prior assessment  Early Detection of Sepsis Score *See Row Information* Low  MEWS guidelines implemented *See Row Information* Yes  Notify: Charge Nurse/RN  Name of Charge Nurse/RN Notified K. Cyriah Childrey  Date Charge Nurse/RN Notified 11/03/19  Time Charge Nurse/RN Notified [phone]

## 2019-11-03 NOTE — Progress Notes (Signed)
Patient without complaints. PICC now in place.  Hemoglobin down to 7.0; 1 unit of packed RBCs ordered. No melena, hematochezia or hematemesis (no BM overnight).  Eating with assistance. Staff reports patient is taking her prednisone and lactulose.  Temp:  [97.8 F (36.6 C)-98.2 F (36.8 C)] 98.2 F (36.8 C) (09/19 0800) Pulse Rate:  [93-106] 106 (09/19 0800) Resp:  [12-20] 12 (09/19 0800) BP: (102-133)/(63-77) 125/75 (09/19 0800) SpO2:  [95 %-100 %] 100 % (09/19 0800) Last BM Date: 11/02/19 General:   Seen with nursing staff.  Jaundiced.  Patient awake and conversant.  Appears in no acute distress.  Patient with multiple skin excoriation (head, neck, trunk and extremities) with bleeding.  Scalp hair matted with old blood.  Large area of ecchymosis left antecubital area Abdomen: Soft, nontender to palpation   Intake/Output from previous day: 09/18 0701 - 09/19 0700 In: 676.9 [I.V.:638.2; IV Piggyback:38.8] Out: 252 [Urine:251; Stool:1] Intake/Output this shift: Total I/O In: 10 [I.V.:10] Out: -   Lab Results: Recent Labs    11/01/19 0328 11/02/19 0751 11/03/19 0740  WBC 5.6 5.5 4.3  HGB 9.0* 8.1* 7.0*  HCT 26.8* 26.0* 22.2*  PLT 121* 102* 96*   BMET Recent Labs    11/01/19 0328 11/02/19 0751 11/03/19 0740  NA 123* 127* 128*  K 3.6 3.3* 4.0  CL 93* 96* 98  CO2 20* 19* 16*  GLUCOSE 70 100* 137*  BUN 30* 33* 35*  CREATININE 1.93* 1.73* 1.35*  CALCIUM 7.7* 8.0* 8.4*   LFT Recent Labs    11/01/19 0328 11/01/19 1214 11/03/19 0740  PROT 4.9*   < > 5.2*  ALBUMIN 1.5*   < > 2.3*  AST 96*   < > 94*  ALT 85*   < > 62*  ALKPHOS 428*   < > 318*  BILITOT 8.9*  9.0*   < > 20.3*  BILIDIR 5.6*  --   --   IBILI 3.3*  --   --    < > = values in this interval not displayed.   PT/INR Recent Labs    11/02/19 0751 11/03/19 0740  LABPROT 19.0* 16.5*  INR 1.7* 1.4*   Hepatitis Panel Recent Labs    10/31/19 1636  HEPBSAG NON REACTIVE  HCVAB NON REACTIVE   HEPAIGM NON REACTIVE  HEPBIGM NON REACTIVE    Impression: Alcoholic hepatitis with secondary hepatic encephalopathy and electrolyte derangement. Serum creatinine/albumin has improved since admission.  Third dose of albumin being given today. Now on lactulose and prednisone. Decline in hemoglobin likely multifactorial in etiology.  She has certainly lost some blood recently from skin/soft tissue losses. No evidence of GI bleeding.  Recommendations: Continue lactulose and prednisone; may need upward titration and lactulose depending on stooling. Prognosis guarded.  She will be reassessed tomorrow morning.

## 2019-11-03 NOTE — Progress Notes (Signed)
PROGRESS NOTE   Nicole [name]  [id number] DOB: October 21, 1950 DOA: 10/31/2019 PCP: Rosita Fire, MD   Chief Complaint  Patient presents with  . Jaundice   Brief Admission History:  69 y.o. female, with history of thyroid nodule, seizures, schizophrenia, HTN, GERD, and Afib who presents to the ED at the recommendation of her PCP. Patient reports that she made an appt with PCP for chronic leg pain, and she was told to come to the ED for jaundice. Patient reports that she had not noticed any change in color at home. Patient does report that she is an alcoholic. She reports that she drinks too much too often.  She was admitted with jaundice and findings of alcoholic hepatitis.   Assessment & Plan:   Principal Problem:   Alcoholic hepatitis Active Problems:   Diverticulosis of sigmoid colon   Hiatal hernia   Chronic alcoholism (HCC)   Arthritis   Hyponatremia   Epigastric abdominal tenderness   Acute liver failure   Hepatic encephalopathy (HCC)   Hypotension   Jaundice   Hyperbilirubinemia   Hyperammonemia (HCC)   AKI (acute kidney injury) ([place])  1. Alcoholic hepatitis - Pt presented with jaundice and elevated LFTs and markedly elevated bilirubin. Unfortunately her bilirubin continues to climb now up to 20 and appears more jaundiced.  She is being treated supportively. GI consult requested.  Acute hepatitis panel negative and GI started on steroids.  Prognosis seems very guarded at this time.   Palliative medicine consult requested for goals of care.  2. Hyponatremia - improved slightly with hydration, secondary to chronic liver disease, following.  3. Hepatic encephalopathy - mentation seems a little better today.  Ammonia is being followed, GI started lactulose.   4. Abdominal pain - generalized - lipase trending down. No evidence of biliary obstruction on [place] or CT.  5. Right shoulder pain - xray right shoulder with no acute findings.   DVT prophylaxis: SCDs Code Status: Full    Family Communication: call to spouse 9/19, I asked him to find her living will and get it to [place] if possible Disposition:  SNF   Status is: Inpatient  Remains inpatient appropriate because:IV treatments appropriate due to intensity of illness or inability to take PO and Inpatient level of care appropriate due to severity of illness  Dispo: The patient is from: Home              Anticipated d/c is to: SNF              Anticipated d/c date is: 3 days              Patient currently is not medically stable to d/c.  Consultants:   GI   Procedures:     Antimicrobials:     Subjective: Pt more alert today but appears markedly more jaundiced, denies pruritus at this time  Objective: Vitals:   11/02/19 2012 11/03/19 0437 11/03/19 0646 11/03/19 0800  BP: 120/77 124/77 133/68 125/75  Pulse: 93 (!) 104 100 (!) 106  Resp: 20 20 20 12   Temp: 98.1 F (36.7 C) 97.8 F (36.6 C) 97.9 F (36.6 C) 98.2 F (36.8 C)  TempSrc: Oral Oral Oral Oral  SpO2: 98% 98% 96% 100%  Weight:      Height:        Intake/Output Summary (Last 24 hours) at 11/03/2019 1038 Last data filed at 11/03/2019 0951 Gross per 24 hour  Intake 686.94 ml  Output 252 ml  Net 434.94 ml  Examination:  General exam: chronically ill appearing, diffuse jaundice and scleral icterus (worse today than yesterday), somnolent, confused.  Respiratory system:  Respiratory effort normal. Cardiovascular system: normal S1 & S2 heard. No JVD, murmurs, rubs, gallops or clicks. 1+ pedal edema. Gastrointestinal system: Abdomen is nondistended, soft and epigastric tenderness with no guarding. No organomegaly or masses felt. Normal bowel sounds heard. Central nervous system: oriented to person. No focal neurological deficits. Extremities: no gross abnormalities.  Skin: No rashes, lesions or ulcers Psychiatry: flat affect.   Data Reviewed: I have personally reviewed following labs and imaging studies  CBC: Recent Labs  Lab  10/31/19 1636 11/01/19 0328 11/02/19 0751 11/03/19 0740  WBC 6.7 5.6 5.5 4.3  NEUTROABS 5.4  --   --   --   HGB 10.0* 9.0* 8.1* 7.0*  HCT 29.8* 26.8* 26.0* 22.2*  MCV 98.3 99.6 105.3* 104.2*  PLT 142* 121* 102* 96*    Basic Metabolic Panel: Recent Labs  Lab 10/31/19 1636 11/01/19 0328 11/02/19 0751 11/03/19 0740  NA 123* 123* 127* 128*  K 4.1 3.6 3.3* 4.0  CL 89* 93* 96* 98  CO2 21* 20* 19* 16*  GLUCOSE 81 70 100* 137*  BUN 30* 30* 33* 35*  CREATININE 1.88* 1.93* 1.73* 1.35*  CALCIUM 8.0* 7.7* 8.0* 8.4*  MG  --  1.6* 2.0 2.1  PHOS  --  3.7  --   --     GFR: Estimated Creatinine Clearance: 44.7 mL/min (A) (by C-G formula based on SCr of 1.35 mg/dL (H)).  Liver Function Tests: Recent Labs  Lab 10/31/19 1636 11/01/19 0328 11/01/19 1214 11/02/19 0751 11/03/19 0740  AST 103* 96*  --  88* 94*  ALT 96* 85*  --  65* 62*  ALKPHOS 488* 428*  --  337* 318*  BILITOT 9.0* 8.9*  9.0*  --  15.3* 20.3*  PROT 5.5* 4.9*  --  4.9* 5.2*  ALBUMIN 1.8* 1.5* 1.7* 2.2* 2.3*    CBG: No results for input(s): GLUCAP in the last 168 hours.  Recent Results (from the past 240 hour(s))  SARS Coronavirus 2 by RT PCR (hospital order, performed in [facility] hospital lab) Nasopharyngeal Nasopharyngeal Swab     Status: None   Collection Time: 10/31/19 11:20 PM   Specimen: Nasopharyngeal Swab  Result Value Ref Range Status   SARS Coronavirus 2 NEGATIVE NEGATIVE Final    Comment: (NOTE) SARS-CoV-2 target nucleic acids are NOT DETECTED.  The SARS-CoV-2 RNA is generally detectable in upper and lower respiratory specimens during the acute phase of infection. The lowest concentration of SARS-CoV-2 viral copies this assay can detect is 250 copies / mL. A negative result does not preclude SARS-CoV-2 infection and should not be used as the sole basis for treatment or other patient management decisions.  A negative result may occur with improper specimen collection / handling, submission of  specimen other than nasopharyngeal swab, presence of viral mutation(s) within the areas targeted by this assay, and inadequate number of viral copies (<250 copies / mL). A negative result must be combined with clinical observations, patient history, and epidemiological information.  Fact Sheet for Patients:   StrictlyIdeas.no  Fact Sheet for Healthcare Providers: BankingDealers.co.za  This test is not yet approved or  cleared by the [place] FDA and has been authorized for detection and/or diagnosis of SARS-CoV-2 by FDA under an Emergency Use Authorization (EUA).  This EUA will remain in effect (meaning this test can be used) for the duration of the COVID-19  declaration under Section 564(b)(1) of the Act, 21 U.S.C. section 360bbb-3(b)(1), unless the authorization is terminated or revoked sooner.  Performed at [facility], [address], [place], [place] [address]      Radiology Studies: DG Shoulder Right Port  Result Date: 11/02/2019 CLINICAL DATA:  Right shoulder pain. EXAM: PORTABLE RIGHT SHOULDER COMPARISON:  None. FINDINGS: There is no evidence of fracture or dislocation. There is no evidence of arthropathy or other focal bone abnormality. A right-sided PICC line is in place. Soft tissues are otherwise unremarkable. IMPRESSION: Negative. Electronically Signed   By: Virgina Norfolk M.D.   On: 11/02/2019 19:30   [place] EKG SITE RITE  Result Date: 11/02/2019 If Site Rite image not attached, placement could not be confirmed due to current cardiac rhythm.  Scheduled Meds: . sodium chloride   Intravenous Once  . Chlorhexidine Gluconate Cloth  6 each Topical Daily  . ferrous sulfate  325 mg Oral Q breakfast  . folic acid  1 mg Oral Daily  . heparin  5,000 Units Subcutaneous Q8H  . lactulose  10 g Oral Daily  . LORazepam  0-4 mg Intravenous Q12H  . metoprolol tartrate  50 mg Oral BID  . multivitamin with minerals  1 tablet Oral  Daily  . predniSONE  40 mg Oral Daily  . sodium chloride flush  10-40 mL Intracatheter Q12H  . thiamine  100 mg Oral Daily   Or  . thiamine  100 mg Intravenous Daily   Continuous Infusions: . sodium chloride 30 mL/hr at 11/02/19 1850    LOS: 3 days   Time spent: 31 mins  Mileena Rothenberger Wynetta Emery, MD How to contact the [facility] Attending or Consulting provider [place] or covering provider during after hours [place], for this patient?  1. Check the care team in [facility] and look for a) attending/consulting TRH provider listed and b) the [facility] team listed 2. Log into [email or web address] and use 's universal password to access. If you do not have the password, please contact the hospital operator. 3. Locate the [facility] provider you are looking for under [facility] and page to a number that you can be directly reached. 4. If you still have difficulty reaching the provider, please page the [facility] (Director on Call) for the Hospitalists listed on amion for assistance.  11/03/2019, 10:38 AM

## 2019-11-04 LAB — COMPREHENSIVE METABOLIC PANEL
ALT: 54 U/L — ABNORMAL HIGH (ref 0–44)
ALT: 56 U/L — ABNORMAL HIGH (ref 0–44)
AST: 75 U/L — ABNORMAL HIGH (ref 15–41)
AST: 78 U/L — ABNORMAL HIGH (ref 15–41)
Albumin: 2.8 g/dL — ABNORMAL LOW (ref 3.5–5.0)
Albumin: 2.8 g/dL — ABNORMAL LOW (ref 3.5–5.0)
Alkaline Phosphatase: 264 U/L — ABNORMAL HIGH (ref 38–126)
Alkaline Phosphatase: 272 U/L — ABNORMAL HIGH (ref 38–126)
Anion gap: 12 (ref 5–15)
Anion gap: 10 (ref 5–15)
BUN: 36 mg/dL — ABNORMAL HIGH (ref 8–23)
BUN: 40 mg/dL — ABNORMAL HIGH (ref 8–23)
CO2: 19 mmol/L — ABNORMAL LOW (ref 22–32)
CO2: 20 mmol/L — ABNORMAL LOW (ref 22–32)
Calcium: 8.7 mg/dL — ABNORMAL LOW (ref 8.9–10.3)
Calcium: 8.5 mg/dL — ABNORMAL LOW (ref 8.9–10.3)
Chloride: 101 mmol/L (ref 98–111)
Chloride: 101 mmol/L (ref 98–111)
Creatinine, Ser: 1.16 mg/dL — ABNORMAL HIGH (ref 0.44–1.00)
Creatinine, Ser: 1.1 mg/dL — ABNORMAL HIGH (ref 0.44–1.00)
GFR calc Af Amer: 55 mL/min — ABNORMAL LOW (ref >60)
GFR calc Af Amer: 59 mL/min — ABNORMAL LOW (ref >60)
GFR calc non Af Amer: 48 mL/min — ABNORMAL LOW (ref >60)
GFR calc non Af Amer: 51 mL/min — ABNORMAL LOW (ref >60)
Glucose, Bld: 131 mg/dL — ABNORMAL HIGH (ref 70–99)
Glucose, Bld: 114 mg/dL — ABNORMAL HIGH (ref 70–99)
Potassium: 3.8 mmol/L (ref 3.5–5.1)
Potassium: 3.5 mmol/L (ref 3.5–5.1)
Sodium: 132 mmol/L — ABNORMAL LOW (ref 135–145)
Sodium: 131 mmol/L — ABNORMAL LOW (ref 135–145)
Total Bilirubin: 21 mg/dL (ref 0.3–1.2)
Total Bilirubin: 21 mg/dL (ref 0.3–1.2)
Total Protein: 5.5 g/dL — ABNORMAL LOW (ref 6.5–8.1)
Total Protein: 5.5 g/dL — ABNORMAL LOW (ref 6.5–8.1)

## 2019-11-04 LAB — PROTIME-INR
INR: 1.3 — ABNORMAL HIGH (ref 0.8–1.2)
Prothrombin Time: 15.7 seconds — ABNORMAL HIGH (ref 11.4–15.2)

## 2019-11-04 LAB — CBC
HCT: 22.5 % — ABNORMAL LOW (ref 36.0–46.0)
Hemoglobin: 7.1 g/dL — ABNORMAL LOW (ref 12.0–15.0)
MCH: 30.9 pg (ref 26.0–34.0)
MCHC: 31.6 g/dL (ref 30.0–36.0)
MCV: 97.8 fL (ref 80.0–100.0)
Platelets: 93 10*3/uL — ABNORMAL LOW (ref 150–400)
RBC: 2.3 MIL/uL — ABNORMAL LOW (ref 3.87–5.11)
RDW: 23.2 % — ABNORMAL HIGH (ref 11.5–15.5)
WBC: 5.3 10*3/uL (ref 4.0–10.5)
nRBC: 0 % (ref 0.0–0.2)

## 2019-11-04 LAB — LIPASE, BLOOD: Lipase: 61 U/L — ABNORMAL HIGH (ref 11–51)

## 2019-11-04 LAB — PREPARE RBC (CROSSMATCH)

## 2019-11-04 LAB — MAGNESIUM: Magnesium: 2.2 mg/dL (ref 1.7–2.4)

## 2019-11-04 MED ORDER — LACTULOSE 10 GM/15ML PO SOLN
10.0000 g | Freq: Three times a day (TID) | ORAL | Status: DC
  Administered 2019-11-04 – 2019-11-05 (×2): 10 g via ORAL
  Filled 2019-11-04 (×2): qty 30

## 2019-11-04 MED ORDER — SODIUM CHLORIDE 0.9% IV SOLUTION: Freq: Once | INTRAVENOUS | Status: DC

## 2019-11-04 NOTE — Progress Notes (Addendum)
PROGRESS NOTE   Nicole [name]  [id number] DOB: 1950/06/30 DOA: 10/31/2019 PCP: Rosita Fire, MD   Chief Complaint  Patient presents with  . Jaundice   Brief Admission History:  69 y.o. female, with history of thyroid nodule, seizures, schizophrenia, HTN, GERD, and Afib who presents to the ED at the recommendation of her PCP. Patient reports that she made an appt with PCP for chronic leg pain, and she was told to come to the ED for jaundice. Patient reports that she had not noticed any change in color at home. Patient does report that she is an alcoholic. She reports that she drinks too much too often.  She was admitted with jaundice and findings of alcoholic hepatitis.   Assessment & Plan:   Principal Problem:   Alcoholic hepatitis Active Problems:   Diverticulosis of sigmoid colon   Hiatal hernia   Chronic alcoholism (HCC)   Arthritis   Hyponatremia   Epigastric abdominal tenderness   Acute liver failure   Hepatic encephalopathy (HCC)   Hypotension   Jaundice   Hyperbilirubinemia   Hyperammonemia (HCC)   AKI (acute kidney injury) ([place])  1. Alcoholic hepatitis - Pt presented with jaundice and elevated LFTs and markedly elevated bilirubin. Unfortunately her bilirubin continues to climb now up to 21 and appears more jaundiced.  She is being treated supportively. GI consult requested.  Acute hepatitis panel negative and GI started on steroids 9/18.  Prognosis seems guarded at this time.   Palliative medicine consult requested for goals of care. I personally conferenced with the palliative provider about patient.  2. Hyponatremia - improving with hydration, secondary to chronic liver disease, following.  3. Anemia chronic disease - Hg only up to 7.1 after 1 unit PRBC.  Give additional 1 unit PRBC today.  4. Hepatic encephalopathy - mentation seems a little better today.  Ammonia is being followed, GI started lactulose.   5. Abdominal pain - generalized - lipase trending down. No  evidence of biliary obstruction on [place] or CT.  6. Right shoulder pain - xray right shoulder with no acute findings.   DVT prophylaxis: SCDs Code Status: Full  Family Communication: call to spouse 9/19, 9/20, I asked him to find her living will and get it to [place] if possible Disposition:  SNF   Status is: Inpatient  Remains inpatient appropriate because:IV treatments appropriate due to intensity of illness or inability to take PO and Inpatient level of care appropriate due to severity of illness  Dispo: The patient is from: Home              Anticipated d/c is to: SNF              Anticipated d/c date is: 3 days              Patient currently is not medically stable to d/c.  Consultants:   GI   Procedures:     Antimicrobials:     Subjective: Pt somnolent but arousal and appears markedly jaundiced.   Objective: Vitals:   11/03/19 2041 11/03/19 2118 11/03/19 2338 11/04/19 0535  BP: 115/72 112/70 136/77 120/78  Pulse: 92 91 88 96  Resp: 20 18 18 18   Temp: 97.8 F (36.6 C) 98.1 F (36.7 C) 97.9 F (36.6 C) 97.9 F (36.6 C)  TempSrc: Oral Oral Oral Oral  SpO2: 93% 94% 94% 96%  Weight:      Height:        Intake/Output Summary (Last 24 hours) at 11/04/2019  1347 Last data filed at 11/04/2019 0539 Gross per 24 hour  Intake 1598.19 ml  Output 200 ml  Net 1398.19 ml   Examination:  General exam: chronically ill appearing, diffuse jaundice and scleral icterus (worse today than than on initial presentation), somnolent, confused.  Respiratory system:  Respiratory effort normal. Cardiovascular system: normal S1 & S2 heard. No JVD, murmurs, rubs, gallops or clicks. 1+ pedal edema. Gastrointestinal system: Abdomen is nondistended, soft and epigastric tenderness with no guarding. No organomegaly or masses felt. Normal bowel sounds heard. Central nervous system: oriented to person. No focal neurological deficits. Extremities: no gross abnormalities.  Skin: No rashes, lesions or  ulcers Psychiatry: flat affect.   Data Reviewed: I have personally reviewed following labs and imaging studies  CBC: Recent Labs  Lab 10/31/19 1636 11/01/19 0328 11/02/19 0751 11/03/19 0740 11/04/19 0626  WBC 6.7 5.6 5.5 4.3 5.3  NEUTROABS 5.4  --   --   --   --   HGB 10.0* 9.0* 8.1* 7.0* 7.1*  HCT 29.8* 26.8* 26.0* 22.2* 22.5*  MCV 98.3 99.6 105.3* 104.2* 97.8  PLT 142* 121* 102* 96* 93*    Basic Metabolic Panel: Recent Labs  Lab 10/31/19 1636 11/01/19 0328 11/02/19 0751 11/03/19 0740 11/04/19 0626  NA 123* 123* 127* 128* 132*  K 4.1 3.6 3.3* 4.0 3.8  CL 89* 93* 96* 98 101  CO2 21* 20* 19* 16* 19*  GLUCOSE 81 70 100* 137* 131*  BUN 30* 30* 33* 35* 36*  CREATININE 1.88* 1.93* 1.73* 1.35* 1.16*  CALCIUM 8.0* 7.7* 8.0* 8.4* 8.7*  MG  --  1.6* 2.0 2.1 2.2  PHOS  --  3.7  --   --   --     GFR: Estimated Creatinine Clearance: 52.1 mL/min (A) (by C-G formula based on SCr of 1.16 mg/dL (H)).  Liver Function Tests: Recent Labs  Lab 10/31/19 1636 10/31/19 1636 11/01/19 0328 11/01/19 1214 11/02/19 0751 11/03/19 0740 11/04/19 0626  AST 103*  --  96*  --  88* 94* 75*  ALT 96*  --  85*  --  65* 62* 54*  ALKPHOS 488*  --  428*  --  337* 318* 264*  BILITOT 9.0*  --  8.9*  9.0*  --  15.3* 20.3* 21.0*  PROT 5.5*  --  4.9*  --  4.9* 5.2* 5.5*  ALBUMIN 1.8*   < > 1.5* 1.7* 2.2* 2.3* 2.8*   < > = values in this interval not displayed.    CBG: No results for input(s): GLUCAP in the last 168 hours.  Recent Results (from the past 240 hour(s))  SARS Coronavirus 2 by RT PCR (hospital order, performed in [facility] hospital lab) Nasopharyngeal Nasopharyngeal Swab     Status: None   Collection Time: 10/31/19 11:20 PM   Specimen: Nasopharyngeal Swab  Result Value Ref Range Status   SARS Coronavirus 2 NEGATIVE NEGATIVE Final    Comment: (NOTE) SARS-CoV-2 target nucleic acids are NOT DETECTED.  The SARS-CoV-2 RNA is generally detectable in upper and lower respiratory  specimens during the acute phase of infection. The lowest concentration of SARS-CoV-2 viral copies this assay can detect is 250 copies / mL. A negative result does not preclude SARS-CoV-2 infection and should not be used as the sole basis for treatment or other patient management decisions.  A negative result may occur with improper specimen collection / handling, submission of specimen other than nasopharyngeal swab, presence of viral mutation(s) within the areas targeted by this  assay, and inadequate number of viral copies (<250 copies / mL). A negative result must be combined with clinical observations, patient history, and epidemiological information.  Fact Sheet for Patients:   StrictlyIdeas.no  Fact Sheet for Healthcare Providers: BankingDealers.co.za  This test is not yet approved or  cleared by the [place] FDA and has been authorized for detection and/or diagnosis of SARS-CoV-2 by FDA under an Emergency Use Authorization (EUA).  This EUA will remain in effect (meaning this test can be used) for the duration of the COVID-19 declaration under Section 564(b)(1) of the Act, 21 U.S.C. section 360bbb-3(b)(1), unless the authorization is terminated or revoked sooner.  Performed at [facility], [address] [place], [place] [address]      Radiology Studies: DG Shoulder Right Port  Result Date: 11/02/2019 CLINICAL DATA:  Right shoulder pain. EXAM: PORTABLE RIGHT SHOULDER COMPARISON:  None. FINDINGS: There is no evidence of fracture or dislocation. There is no evidence of arthropathy or other focal bone abnormality. A right-sided PICC line is in place. Soft tissues are otherwise unremarkable. IMPRESSION: Negative. Electronically Signed   By: Virgina Norfolk M.D.   On: 11/02/2019 19:30   Scheduled Meds: . sodium chloride   Intravenous Once  . Chlorhexidine Gluconate Cloth  6 each Topical Daily  . ferrous sulfate  325 mg Oral Q  breakfast  . folic acid  1 mg Oral Daily  . lactulose  10 g Oral Daily  . LORazepam  0-4 mg Intravenous Q12H  . metoprolol tartrate  50 mg Oral BID  . multivitamin with minerals  1 tablet Oral Daily  . predniSONE  40 mg Oral Daily  . sodium chloride flush  10-40 mL Intracatheter Q12H  . thiamine  100 mg Oral Daily   Or  . thiamine  100 mg Intravenous Daily   Continuous Infusions: . sodium chloride 50 mL/hr at 11/03/19 2348    LOS: 4 days   Time spent: 31 mins  Papa [place] Wynetta Emery, MD How to contact the [facility] Attending or Consulting provider [place] or covering provider during after hours [place], for this patient?  1. Check the care team in [facility] and look for a) attending/consulting TRH provider listed and b) the [facility] team listed 2. Log into [email or web address] and use [place]'s universal password to access. If you do not have the password, please contact the hospital operator. 3. Locate the [facility] provider you are looking for under [facility] and page to a number that you can be directly reached. 4. If you still have difficulty reaching the provider, please page the [facility] (Director on Call) for the Hospitalists listed on amion for assistance.  11/04/2019, 1:47 PM

## 2019-11-04 NOTE — Progress Notes (Signed)
CRITICAL VALUE ALERT  Critical Value:  Bilirubin 21  Date & Time Notied:  11/04/19 8:45 AM  Provider Notified: Dr. Wynetta Emery    Orders Received/Actions taken: No new orders at this time.

## 2019-11-04 NOTE — Progress Notes (Signed)
Subjective:  Patient has no complaints. When I saw her earlier in the day she was complaining of mittens and wanted them removed. Since they have been removed she is feeling fine. According to the nursing staff she did eat some at lunchtime.  Current Medications:  Current Facility-Administered Medications:  .  0.9 %  sodium chloride infusion (Manually program via Guardrails IV Fluids), , Intravenous, Once, Johnson, Clanford L, MD .  0.9 %  sodium chloride infusion, , Intravenous, Continuous, Cordae Mccarey U, MD, Last Rate: 50 mL/hr at 11/03/19 2348, New Bag at 11/03/19 2348 .  Chlorhexidine Gluconate Cloth 2 % PADS 6 each, 6 each, Topical, Daily, Wynetta Emery, Clanford L, MD, 6 each at 11/02/19 2007 .  ferrous sulfate tablet 325 mg, 325 mg, Oral, Q breakfast, Johnson, Clanford L, MD, 325 mg at 94/49/67 5916 .  folic acid (FOLVITE) tablet 1 mg, 1 mg, Oral, Daily, Zierle-Ghosh, Asia B, DO, 1 mg at 11/04/19 1525 .  lactulose (CHRONULAC) 10 GM/15ML solution 10 g, 10 g, Oral, Daily, Rourk, Cristopher Estimable, MD, 10 g at 11/04/19 1525 .  [EXPIRED] LORazepam (ATIVAN) injection 0-4 mg, 0-4 mg, Intravenous, Q6H, 2 mg at 11/02/19 0119 **FOLLOWED BY** LORazepam (ATIVAN) injection 0-4 mg, 0-4 mg, Intravenous, Q12H, Zierle-Ghosh, Asia B, DO .  metoprolol tartrate (LOPRESSOR) tablet 50 mg, 50 mg, Oral, BID, Zierle-Ghosh, Asia B, DO, 50 mg at 11/04/19 1525 .  multivitamin with minerals tablet 1 tablet, 1 tablet, Oral, Daily, Zierle-Ghosh, Asia B, DO, 1 tablet at 11/03/19 0950 .  ondansetron (ZOFRAN) tablet 4 mg, 4 mg, Oral, Q6H PRN **OR** ondansetron (ZOFRAN) injection 4 mg, 4 mg, Intravenous, Q6H PRN, Zierle-Ghosh, Asia B, DO .  oxyCODONE (Oxy IR/ROXICODONE) immediate release tablet 5 mg, 5 mg, Oral, Q4H PRN, Zierle-Ghosh, Asia B, DO, 5 mg at 11/04/19 0651 .  predniSONE (DELTASONE) tablet 40 mg, 40 mg, Oral, Daily, Rourk, Cristopher Estimable, MD, 40 mg at 11/04/19 1525 .  sodium chloride flush (NS) 0.9 % injection 10-40 mL, 10-40  mL, Intracatheter, Q12H, Johnson, Clanford L, MD, 10 mL at 11/03/19 2343 .  sodium chloride flush (NS) 0.9 % injection 10-40 mL, 10-40 mL, Intracatheter, PRN, Johnson, Clanford L, MD .  thiamine tablet 100 mg, 100 mg, Oral, Daily, 100 mg at 11/03/19 0950 **OR** thiamine (B-1) injection 100 mg, 100 mg, Intravenous, Daily, Zierle-Ghosh, Asia B, DO   Objective: Blood pressure 99/70, pulse 81, temperature 98 F (36.7 C), temperature source Oral, resp. rate 16, height 5' 8"  (1.727 m), weight 87 kg, SpO2 97 %. Patient is drowsy but responds appropriate to questions. She has scabs over bleeding site over right forehead/temple region. There was some dried blood over chest and neck but is all been cleaned. Sclera remains deeply icteric. Abdomen is full but soft without tenderness organomegaly or masses. No peripheral edema or clubbing noted.  Labs/studies Results:  CBC Latest Ref Rng & Units 11/04/2019 11/03/2019 11/02/2019  WBC 4.0 - 10.5 K/uL 5.3 4.3 5.5  Hemoglobin 12.0 - 15.0 g/dL 7.1(L) 7.0(L) 8.1(L)  Hematocrit 36 - 46 % 22.5(L) 22.2(L) 26.0(L)  Platelets 150 - 400 K/uL 93(L) 96(L) 102(L)    CMP Latest Ref Rng & Units 11/04/2019 11/03/2019 11/02/2019  Glucose 70 - 99 mg/dL 131(H) 137(H) 100(H)  BUN 8 - 23 mg/dL 36(H) 35(H) 33(H)  Creatinine 0.44 - 1.00 mg/dL 1.16(H) 1.35(H) 1.73(H)  Sodium 135 - 145 mmol/L 132(L) 128(L) 127(L)  Potassium 3.5 - 5.1 mmol/L 3.8 4.0 3.3(L)  Chloride 98 - 111 mmol/L 101 98  96(L)  CO2 22 - 32 mmol/L 19(L) 16(L) 19(L)  Calcium 8.9 - 10.3 mg/dL 8.7(L) 8.4(L) 8.0(L)  Total Protein 6.5 - 8.1 g/dL 5.5(L) 5.2(L) 4.9(L)  Total Bilirubin 0.3 - 1.2 mg/dL 21.0(HH) 20.3(HH) 15.3(H)  Alkaline Phos 38 - 126 U/L 264(H) 318(H) 337(H)  AST 15 - 41 U/L 75(H) 94(H) 88(H)  ALT 0 - 44 U/L 54(H) 62(H) 65(H)    Hepatic Function Latest Ref Rng & Units 11/04/2019 11/03/2019 11/02/2019  Total Protein 6.5 - 8.1 g/dL 5.5(L) 5.2(L) 4.9(L)  Albumin 3.5 - 5.0 g/dL 2.8(L) 2.3(L) 2.2(L)  AST  15 - 41 U/L 75(H) 94(H) 88(H)  ALT 0 - 44 U/L 54(H) 62(H) 65(H)  Alk Phosphatase 38 - 126 U/L 264(H) 318(H) 337(H)  Total Bilirubin 0.3 - 1.2 mg/dL 21.0(HH) 20.3(HH) 15.3(H)  Bilirubin, Direct 0.0 - 0.2 mg/dL - - -    INR 1.3.  Assessment:  #1. Decompensated alcoholic liver disease. Patient has alcoholic hepatitis. She possibly has underlying cirrhosis as well. Cholestasis has been progressive. INR has improved with vitamin K which is reassuring. Day 2 on prednisone. We do not have prednisolone on her formulary. There has been significant improvement in renal function with IV hydration and albumin infusion. Anemia has worsened. No evidence of overt GI bleed but she has bled significant from scalp lesions possibly occurring from scratching. Patient has received 1 unit of PRBCs but hemoglobin remains quite low. As discussed with Dr. Wynetta Emery she will receive another unit of PRBCs. Hepatic encephalopathy. Patient is on lactulose. Drowsiness most likely due to pain medication. If serum ammonia continues to creep up we will start patient on Xifaxan.  Recommendations  Increase IV fluid rate to 75 mL/h Increase lactulose dose to 10 g 3 times a day. Patient will receive another unit of PRBCs as discussed with Dr. Wynetta Emery. For repeat lab in a.m. to include CBC, c-Met, INR and serum ammonia.

## 2019-11-05 ENCOUNTER — Inpatient Hospital Stay (HOSPITAL_COMMUNITY): Payer: Medicare HMO

## 2019-11-05 DIAGNOSIS — K7010 Alcoholic hepatitis without ascites: Secondary | ICD-10-CM

## 2019-11-05 DIAGNOSIS — F1020 Alcohol dependence, uncomplicated: Secondary | ICD-10-CM

## 2019-11-05 DIAGNOSIS — K7290 Hepatic failure, unspecified without coma: Secondary | ICD-10-CM

## 2019-11-05 DIAGNOSIS — Z7189 Other specified counseling: Secondary | ICD-10-CM

## 2019-11-05 DIAGNOSIS — R7989 Other specified abnormal findings of blood chemistry: Secondary | ICD-10-CM

## 2019-11-05 DIAGNOSIS — R17 Unspecified jaundice: Secondary | ICD-10-CM

## 2019-11-05 DIAGNOSIS — Z515 Encounter for palliative care: Secondary | ICD-10-CM

## 2019-11-05 DIAGNOSIS — E871 Hypo-osmolality and hyponatremia: Secondary | ICD-10-CM

## 2019-11-05 DIAGNOSIS — N179 Acute kidney failure, unspecified: Secondary | ICD-10-CM

## 2019-11-05 LAB — CBC
HCT: 26.1 % — ABNORMAL LOW (ref 36.0–46.0)
Hemoglobin: 8.6 g/dL — ABNORMAL LOW (ref 12.0–15.0)
MCH: 32 pg (ref 26.0–34.0)
MCHC: 33 g/dL (ref 30.0–36.0)
MCV: 97 fL (ref 80.0–100.0)
Platelets: 120 10*3/uL — ABNORMAL LOW (ref 150–400)
RBC: 2.69 MIL/uL — ABNORMAL LOW (ref 3.87–5.11)
RDW: 21.3 % — ABNORMAL HIGH (ref 11.5–15.5)
WBC: 9.6 10*3/uL (ref 4.0–10.5)
nRBC: 0.3 % — ABNORMAL HIGH (ref 0.0–0.2)

## 2019-11-05 LAB — COMPREHENSIVE METABOLIC PANEL
ALT: 59 U/L — ABNORMAL HIGH (ref 0–44)
AST: 83 U/L — ABNORMAL HIGH (ref 15–41)
Albumin: 2.8 g/dL — ABNORMAL LOW (ref 3.5–5.0)
Alkaline Phosphatase: 289 U/L — ABNORMAL HIGH (ref 38–126)
Anion gap: 12 (ref 5–15)
BUN: 43 mg/dL — ABNORMAL HIGH (ref 8–23)
CO2: 18 mmol/L — ABNORMAL LOW (ref 22–32)
Calcium: 8.7 mg/dL — ABNORMAL LOW (ref 8.9–10.3)
Chloride: 103 mmol/L (ref 98–111)
Creatinine, Ser: 1.05 mg/dL — ABNORMAL HIGH (ref 0.44–1.00)
GFR calc Af Amer: >60 mL/min (ref >60)
GFR calc non Af Amer: 54 mL/min — ABNORMAL LOW (ref >60)
Glucose, Bld: 127 mg/dL — ABNORMAL HIGH (ref 70–99)
Potassium: 3.7 mmol/L (ref 3.5–5.1)
Sodium: 133 mmol/L — ABNORMAL LOW (ref 135–145)
Total Bilirubin: 22 mg/dL (ref 0.3–1.2)
Total Protein: 5.8 g/dL — ABNORMAL LOW (ref 6.5–8.1)

## 2019-11-05 LAB — PROTIME-INR
INR: 1.2 (ref 0.8–1.2)
Prothrombin Time: 14.5 seconds (ref 11.4–15.2)

## 2019-11-05 LAB — AMMONIA: Ammonia: 59 umol/L — ABNORMAL HIGH (ref 9–35)

## 2019-11-05 MED ORDER — PANTOPRAZOLE SODIUM 40 MG IV SOLR
40.0000 mg | INTRAVENOUS | Status: DC
  Administered 2019-11-05 – 2019-11-07 (×2): 40 mg via INTRAVENOUS
  Filled 2019-11-05 (×3): qty 40

## 2019-11-05 MED ORDER — ACETAMINOPHEN 325 MG PO TABS
650.0000 mg | ORAL_TABLET | Freq: Four times a day (QID) | ORAL | Status: DC | PRN
  Administered 2019-11-07 – 2019-11-09 (×3): 650 mg via ORAL
  Filled 2019-11-05 (×3): qty 2

## 2019-11-05 MED ORDER — LACTULOSE 10 GM/15ML PO SOLN
20.0000 g | Freq: Three times a day (TID) | ORAL | Status: DC
  Administered 2019-11-05 – 2019-11-07 (×7): 20 g via ORAL
  Filled 2019-11-05 (×9): qty 30

## 2019-11-05 MED ORDER — DIPHENHYDRAMINE HCL 25 MG PO CAPS
25.0000 mg | ORAL_CAPSULE | Freq: Three times a day (TID) | ORAL | Status: DC | PRN
  Administered 2019-11-05 – 2019-11-09 (×5): 25 mg via ORAL
  Filled 2019-11-05 (×5): qty 1

## 2019-11-05 MED ORDER — RIFAXIMIN 550 MG PO TABS
550.0000 mg | ORAL_TABLET | Freq: Two times a day (BID) | ORAL | Status: DC
  Administered 2019-11-05 – 2019-11-09 (×9): 550 mg via ORAL
  Filled 2019-11-05 (×9): qty 1

## 2019-11-05 MED ORDER — KETOROLAC TROMETHAMINE 15 MG/ML IJ SOLN
15.0000 mg | Freq: Three times a day (TID) | INTRAMUSCULAR | Status: AC | PRN
  Administered 2019-11-05: 15 mg via INTRAVENOUS
  Filled 2019-11-05 (×3): qty 1

## 2019-11-05 NOTE — Care Management Important Message (Signed)
Important Message  Patient Details  Name: Nicole [name] MRN: [id number] Date of Birth: 04/13/1950   Medicare Important Message Given:  Yes     Tommy Medal 11/05/2019, 3:22 PM

## 2019-11-05 NOTE — Progress Notes (Signed)
Lab called with critical total bilirubin of 22.  Sent message to Dr. Wynetta Emery

## 2019-11-05 NOTE — Progress Notes (Signed)
Bathed and bedchange. Flexiseal had no output in bag  Small amount in tubing.  Ate very little today.

## 2019-11-05 NOTE — Progress Notes (Signed)
Pt pulled out PICC to (R) upper arm. PICC line intact and measured at 36 cm. [facility] notified with new order for chest xray.

## 2019-11-05 NOTE — Progress Notes (Signed)
PROGRESS NOTE   Nicole [name]  [id number] DOB: Jun 28, 1950 DOA: 10/31/2019 PCP: Rosita Fire, MD   Chief Complaint  Patient presents with  . Jaundice   Brief Admission History:  69 y.o. female, with history of thyroid nodule, seizures, schizophrenia, HTN, GERD, and Afib who presents to the ED at the recommendation of her PCP. Patient reports that she made an appt with PCP for chronic leg pain, and she was told to come to the ED for jaundice. Patient reports that she had not noticed any change in color at home. Patient does report that she is an alcoholic. She reports that she drinks too much too often.  She was admitted with jaundice and findings of alcoholic hepatitis.   Assessment & Plan:   Principal Problem:   Alcoholic hepatitis Active Problems:   Diverticulosis of sigmoid colon   Hiatal hernia   Chronic alcoholism (HCC)   Arthritis   Hyponatremia   Epigastric abdominal tenderness   Acute liver failure   Hepatic encephalopathy (HCC)   Hypotension   Jaundice   Hyperbilirubinemia   Hyperammonemia (HCC)   AKI (acute kidney injury) ([place])  1. Alcoholic hepatitis - Pt presented with jaundice and elevated LFTs and markedly elevated bilirubin. Unfortunately her bilirubin continues to climb now up to 21 and appears more jaundiced.  She is being treated supportively. GI consult requested.  Acute hepatitis panel negative and GI started on steroids 9/18.  Prognosis seems guarded at this time.   Palliative medicine consult requested for goals of care, meeting with family 9/21 son and spouse. I personally conferenced with the palliative provider about patient and plan of care.  2. Hyponatremia - improving with hydration, secondary to chronic liver disease, following.  3. Anemia chronic disease - Hg up to 8.6 after an additional 1 unit PRBC given 9/20.  Follow CBC.  4. Hepatic encephalopathy - mentation seems a little better today.  Ammonia is being followed, GI started lactulose.    5. Abdominal pain - generalized - lipase trending down. No evidence of biliary obstruction on [place] or CT.  6. Right shoulder pain - xray right shoulder with no acute findings.   DVT prophylaxis: SCDs Code Status: Full  Family Communication: call to spouse 9/19, 9/20, son bedside 9/21 Disposition:  SNF   Status is: Inpatient  Remains inpatient appropriate because:IV treatments appropriate due to intensity of illness or inability to take PO and Inpatient level of care appropriate due to severity of illness  Dispo: The patient is from: Home              Anticipated d/c is to: SNF              Anticipated d/c date is: 3 days (or pending palliative discussions)              Patient currently is not medically stable to d/c.  Consultants:   GI   Procedures:     Antimicrobials:    Subjective: Pt says she will try to eat some breakfast today, no abdominal pain, no pruritus.   Objective: Vitals:   11/05/19 0320 11/05/19 0322 11/05/19 0500 11/05/19 0530  BP: 129/86 129/86  (!) 133/91  Pulse: 81 81  77  Resp: 18 18  18   Temp: 98.1 F (36.7 C) 98.1 F (36.7 C)  97.9 F (36.6 C)  TempSrc:      SpO2: 95%   92%  Weight:   84.7 kg   Height:       No  intake or output data in the 24 hours ending 11/05/19 1255  Examination: General exam: chronically ill appearing, diffuse jaundice and scleral icterus, somnolent but arousable.  Respiratory system:  Respiratory effort normal. Cardiovascular system: normal S1 & S2 heard. No JVD, murmurs, rubs, gallops or clicks. 1+ pedal edema. Gastrointestinal system: Abdomen is nondistended, soft and epigastric tenderness with no guarding. No organomegaly or masses felt. Normal bowel sounds heard. Central nervous system: oriented to person. No focal neurological deficits. Extremities: no gross abnormalities.  Skin: No rashes, lesions or ulcers Psychiatry: flat affect slightly improved today.   Data Reviewed: I have personally reviewed following  labs and imaging studies  CBC: Recent Labs  Lab 10/31/19 1636 10/31/19 1636 11/01/19 0328 11/02/19 0751 11/03/19 0740 11/04/19 0626 11/05/19 0921  WBC 6.7   < > 5.6 5.5 4.3 5.3 9.6  NEUTROABS 5.4  --   --   --   --   --   --   HGB 10.0*   < > 9.0* 8.1* 7.0* 7.1* 8.6*  HCT 29.8*   < > 26.8* 26.0* 22.2* 22.5* 26.1*  MCV 98.3   < > 99.6 105.3* 104.2* 97.8 97.0  PLT 142*   < > 121* 102* 96* 93* 120*   < > = values in this interval not displayed.    Basic Metabolic Panel: Recent Labs  Lab 11/01/19 0328 11/01/19 0328 11/02/19 0751 11/03/19 0740 11/04/19 0626 11/04/19 1738 11/05/19 0921  NA 123*   < > 127* 128* 132* 131* 133*  K 3.6   < > 3.3* 4.0 3.8 3.5 3.7  CL 93*   < > 96* 98 101 101 103  CO2 20*   < > 19* 16* 19* 20* 18*  GLUCOSE 70   < > 100* 137* 131* 114* 127*  BUN 30*   < > 33* 35* 36* 40* 43*  CREATININE 1.93*   < > 1.73* 1.35* 1.16* 1.10* 1.05*  CALCIUM 7.7*   < > 8.0* 8.4* 8.7* 8.5* 8.7*  MG 1.6*  --  2.0 2.1 2.2  --   --   PHOS 3.7  --   --   --   --   --   --    < > = values in this interval not displayed.    GFR: Estimated Creatinine Clearance: 56.8 mL/min (A) (by C-G formula based on SCr of 1.05 mg/dL (H)).  Liver Function Tests: Recent Labs  Lab 11/02/19 0751 11/03/19 0740 11/04/19 0626 11/04/19 1738 11/05/19 0921  AST 88* 94* 75* 78* 83*  ALT 65* 62* 54* 56* 59*  ALKPHOS 337* 318* 264* 272* 289*  BILITOT 15.3* 20.3* 21.0* 21.0* 22.0*  PROT 4.9* 5.2* 5.5* 5.5* 5.8*  ALBUMIN 2.2* 2.3* 2.8* 2.8* 2.8*    CBG: No results for input(s): GLUCAP in the last 168 hours.  Recent Results (from the past 240 hour(s))  SARS Coronavirus 2 by RT PCR (hospital order, performed in [facility] hospital lab) Nasopharyngeal Nasopharyngeal Swab     Status: None   Collection Time: 10/31/19 11:20 PM   Specimen: Nasopharyngeal Swab  Result Value Ref Range Status   SARS Coronavirus 2 NEGATIVE NEGATIVE Final    Comment: (NOTE) SARS-CoV-2 target nucleic acids are  NOT DETECTED.  The SARS-CoV-2 RNA is generally detectable in upper and lower respiratory specimens during the acute phase of infection. The lowest concentration of SARS-CoV-2 viral copies this assay can detect is 250 copies / mL. A negative result does not preclude SARS-CoV-2 infection and  should not be used as the sole basis for treatment or other patient management decisions.  A negative result may occur with improper specimen collection / handling, submission of specimen other than nasopharyngeal swab, presence of viral mutation(s) within the areas targeted by this assay, and inadequate number of viral copies (<250 copies / mL). A negative result must be combined with clinical observations, patient history, and epidemiological information.  Fact Sheet for Patients:   StrictlyIdeas.no  Fact Sheet for Healthcare Providers: BankingDealers.co.za  This test is not yet approved or  cleared by the [place] FDA and has been authorized for detection and/or diagnosis of SARS-CoV-2 by FDA under an Emergency Use Authorization (EUA).  This EUA will remain in effect (meaning this test can be used) for the duration of the COVID-19 declaration under Section 564(b)(1) of the Act, 21 U.S.C. section 360bbb-3(b)(1), unless the authorization is terminated or revoked sooner.  Performed at [facility], [address] [place], [place] [address]    Radiology Studies: No results found. Scheduled Meds: . sodium chloride   Intravenous Once  . sodium chloride   Intravenous Once  . Chlorhexidine Gluconate Cloth  6 each Topical Daily  . ferrous sulfate  325 mg Oral Q breakfast  . folic acid  1 mg Oral Daily  . lactulose  10 g Oral TID  . metoprolol tartrate  50 mg Oral BID  . multivitamin with minerals  1 tablet Oral Daily  . predniSONE  40 mg Oral Daily  . rifaximin  550 mg Oral BID  . sodium chloride flush  10-40 mL Intracatheter Q12H  . thiamine   100 mg Oral Daily   Or  . thiamine  100 mg Intravenous Daily   Continuous Infusions: . sodium chloride 60 mL/hr at 11/04/19 1844    LOS: 5 days   Time spent: 30 mins  Konor Noren Wynetta Emery, MD How to contact the [facility] Attending or Consulting provider [place] or covering provider during after hours [place], for this patient?  1. Check the care team in [facility] and look for a) attending/consulting [facility] provider listed and b) the [facility] team listed 2. Log into [email or web address] and use [place]'s universal password to access. If you do not have the password, please contact the hospital operator. 3. Locate the [facility] provider you are looking for under [facility] and page to a number that you can be directly reached. 4. If you still have difficulty reaching the provider, please page the [facility] (Director on Call) for the Hospitalists listed on amion for assistance.  11/05/2019, 12:55 PM

## 2019-11-05 NOTE — Progress Notes (Signed)
Nicole [name], M.D. Gastroenterology & Hepatology   Interval History: No acute events overnight. Patient is very somnolent today in the morning.  The nurse reported that she took a dose of oxycodone in the morning.  She has a rectal tube but has not had a bowel movement since the morning. Patient responds some answers but is disoriented.  Has not presented any nausea or vomiting.  No episodes of hematemesis, melena or hematochezia. Labs today showed increase in her total bilirubin to 22.0, alkaline phosphatase 289.  Inpatient Medications:  Current Facility-Administered Medications:  .  0.9 %  sodium chloride infusion (Manually program via Guardrails IV Fluids), , Intravenous, Once, Johnson, Clanford L, MD .  0.9 %  sodium chloride infusion (Manually program via Guardrails IV Fluids), , Intravenous, Once, Johnson, Clanford L, MD .  0.9 %  sodium chloride infusion, , Intravenous, Continuous, Johnson, Clanford L, MD, Last Rate: 60 mL/hr at 11/04/19 1844, New Bag at 11/04/19 1844 .  Chlorhexidine Gluconate Cloth 2 % PADS 6 each, 6 each, Topical, Daily, Wynetta Emery, Clanford L, MD, 6 each at 11/05/19 0917 .  ferrous sulfate tablet 325 mg, 325 mg, Oral, Q breakfast, Johnson, Clanford L, MD, 325 mg at 11/05/19 0916 .  folic acid (FOLVITE) tablet 1 mg, 1 mg, Oral, Daily, Zierle-Ghosh, Asia B, DO, 1 mg at 11/05/19 0915 .  lactulose (CHRONULAC) 10 GM/15ML solution 10 g, 10 g, Oral, TID, Rehman, Najeeb U, MD, 10 g at 11/05/19 0917 .  metoprolol tartrate (LOPRESSOR) tablet 50 mg, 50 mg, Oral, BID, Zierle-Ghosh, Asia B, DO, 50 mg at 11/05/19 0917 .  multivitamin with minerals tablet 1 tablet, 1 tablet, Oral, Daily, Zierle-Ghosh, Asia B, DO, 1 tablet at 11/05/19 0917 .  ondansetron (ZOFRAN) tablet 4 mg, 4 mg, Oral, Q6H PRN **OR** ondansetron (ZOFRAN) injection 4 mg, 4 mg, Intravenous, Q6H PRN, Zierle-Ghosh, Asia B, DO .  oxyCODONE (Oxy IR/ROXICODONE) immediate release tablet 5 mg, 5 mg, Oral, Q4H PRN,  Zierle-Ghosh, Asia B, DO, 5 mg at 11/05/19 0917 .  predniSONE (DELTASONE) tablet 40 mg, 40 mg, Oral, Daily, Rourk, Cristopher Estimable, MD, 40 mg at 11/05/19 0915 .  sodium chloride flush (NS) 0.9 % injection 10-40 mL, 10-40 mL, Intracatheter, Q12H, Johnson, Clanford L, MD, 10 mL at 11/04/19 2200 .  sodium chloride flush (NS) 0.9 % injection 10-40 mL, 10-40 mL, Intracatheter, PRN, Johnson, Clanford L, MD .  thiamine tablet 100 mg, 100 mg, Oral, Daily, 100 mg at 11/05/19 0916 **OR** thiamine (B-1) injection 100 mg, 100 mg, Intravenous, Daily, Zierle-Ghosh, Asia B, DO   I/O   No intake or output data in the 24 hours ending 11/05/19 1113   Physical Exam: Temp:  [97.9 F (36.6 C)-98.7 F (37.1 C)] 97.9 F (36.6 C) (09/21 0530) Pulse Rate:  [76-81] 77 (09/21 0530) Resp:  [16-20] 18 (09/21 0530) BP: (99-133)/(70-91) 133/91 (09/21 0530) SpO2:  [92 %-97 %] 92 % (09/21 0530) Weight:  [84.7 kg] 84.7 kg (09/21 0500)  Temp (24hrs), Avg:98.2 F (36.8 C), Min:97.9 F (36.6 C), Max:98.7 F (37.1 C) GENERAL: The patient is somnolent, AOX1, falls sleep easily, in mild distress. HEENT: Head is normocephalic and atraumatic. EOMI are intact. Mouth is well hydrated and without lesions. Icteric sclerae. NECK: Supple. No masses LUNGS: Clear to auscultation. No presence of rhonchi/wheezing/rales. Adequate chest expansion HEART: RRR, normal s1 and s2. ABDOMEN: Mildly tender upon palpation of the upper abdomen, no guarding, no peritoneal signs, and nondistended. BS +. No masses. EXTREMITIES: Without any cyanosis, clubbing,  rash, lesions or edema.  Has mild asterixis. NEUROLOGIC: AOx3, no focal motor deficit. SKIN: generalized jaundice, no rashes.  Has presence of excoriations in her upper extremities  Laboratory Data: CBC:     Component Value Date/Time   WBC 9.6 11/05/2019 0921   RBC 2.69 (L) 11/05/2019 0921   HGB 8.6 (L) 11/05/2019 0921   HCT 26.1 (L) 11/05/2019 0921   PLT 120 (L) 11/05/2019 0921   MCV 97.0  11/05/2019 0921   MCH 32.0 11/05/2019 0921   MCHC 33.0 11/05/2019 0921   RDW 21.3 (H) 11/05/2019 0921   LYMPHSABS 0.5 (L) 10/31/2019 1636   MONOABS 0.7 10/31/2019 1636   EOSABS 0.1 10/31/2019 1636   BASOSABS 0.0 10/31/2019 1636   COAG:  Lab Results  Component Value Date   INR 1.2 11/05/2019   INR 1.3 (H) 11/04/2019   INR 1.4 (H) 11/03/2019    BMP:  BMP Latest Ref Rng & Units 11/05/2019 11/04/2019 11/04/2019  Glucose 70 - 99 mg/dL 127(H) 114(H) 131(H)  BUN 8 - 23 mg/dL 43(H) 40(H) 36(H)  Creatinine 0.44 - 1.00 mg/dL 1.05(H) 1.10(H) 1.16(H)  Sodium 135 - 145 mmol/L 133(L) 131(L) 132(L)  Potassium 3.5 - 5.1 mmol/L 3.7 3.5 3.8  Chloride 98 - 111 mmol/L 103 101 101  CO2 22 - 32 mmol/L 18(L) 20(L) 19(L)  Calcium 8.9 - 10.3 mg/dL 8.7(L) 8.5(L) 8.7(L)    HEPATIC:  Hepatic Function Latest Ref Rng & Units 11/05/2019 11/04/2019 11/04/2019  Total Protein 6.5 - 8.1 g/dL 5.8(L) 5.5(L) 5.5(L)  Albumin 3.5 - 5.0 g/dL 2.8(L) 2.8(L) 2.8(L)  AST 15 - 41 U/L 83(H) 78(H) 75(H)  ALT 0 - 44 U/L 59(H) 56(H) 54(H)  Alk Phosphatase 38 - 126 U/L 289(H) 272(H) 264(H)  Total Bilirubin 0.3 - 1.2 mg/dL 22.0(HH) 21.0(HH) 21.0(HH)  Bilirubin, Direct 0.0 - 0.2 mg/dL - - -    CARDIAC:  Lab Results  Component Value Date   TROPONINI <0.03 12/22/2016      Imaging: I personally reviewed and interpreted the available labs, imaging and endoscopic files.   Assessment/Plan: 69 year old Caucasian female with history of atrial fibrillation, GERD, large hiatal hernia, chronic alcohol abuse, history of renal cell carcinoma status post right nephrectomy in August 2019, seizure disorder, history of pancreatic pseudocyst, who was admitted to the hospital after presenting new onset jaundice and weakness.  The patient was found to have alterations consistent with alcoholic hepatitis (patient drinks large amount of vodka on a daily basis) with presence of a moderate score of 37, for which she was started on prednisone, today  she is day 4 of medication.  The patient has had slow increase in her total bilirubin with rest of hepatic synthetic function tests stable.  Her current meld score is 24.  It is possible that due to her chronic alcohol intake she may have underlying cirrhosis.  We will calculate the Lille score at day 7.  Notably, the patient has presented worsening mental status today in the morning of unclear etiology but likely related to recent intake of opiates.  Also she is not moving her bowels frequently.  She would benefit from increasing her lactulose and starting Xifaxan at this moment, but also infectious sources should be evaluated with UA, urine culture and chest x-ray, blood culture should also be checked.  # Acute alcoholic hepatitis  - Daily MELD labs - Check UA/UCx, CXR and BCx today - Stop opiates - Lille score to be calculated day 7 - Increase lactulose 20 mg TID  for 3 BM daily - Start Xifaxan 550 mg BID - Continue prednisone 40 mg qday  Nicole Peppers, MD Gastroenterology and Hepatology [facility]  Note: Occasional unusual wording and randomly placed punctuation marks may result from the use of speech recognition technology to transcribe this document

## 2019-11-05 NOTE — Progress Notes (Signed)
PT Cancellation Note  Patient Details Name: Nicole [name] MRN: [id number] DOB: 1950-04-06   Cancelled Treatment:    Reason Eval/Treat Not Completed: Patient declined, no reason specified  Patient declined therapy with no reason given. Scant amount of blood noted on bed rail with open wound on forehead. Notified nursing who stated patient is scratching constantly causing wounds. Family to consider palliative care and to hold on therapy for today.   3:38 PM, 11/05/19 Jerene Pitch, DPT Physical Therapy with [facility]  [phone] office

## 2019-11-05 NOTE — Consult Note (Addendum)
Consultation Note Date: 11/05/2019   Patient Name: Nicole [name]  DOB: 1950-09-26  MRN: [id number]  Age / Sex: 69 y.o., female  PCP: Nicole Fire, MD Referring Physician: Murlean Iba, MD  Reason for Consultation: Establishing goals of care  HPI/Patient Profile: 69 y.o. female  with past medical history of ETOH use, thyroid nodules, seizures, schizophrenia, HTN, GERD, afib admitted on 10/31/2019 with jaundice from PCP office. Hospital admission for alcoholic hepatis, decompensated alcoholic liver disease, hepatic encephalopathy, hyponatremia. GI following. Bilirubin continues to rise up to 22. Receiving medical management. Prognosis is guarded. Current MELD score of 24. Palliative medicine consultation for goals of care.   Clinical Assessment and Goals of Care:  I have reviewed medical records, discussed with Dr. Wynetta [name] and met with patient and her son Nicole [name]) and husband Nicole [name]) at bedside to discuss goals of care. Nicole [name] is oriented to person, otherwise disoriented and does not engage in [place] discussion. She complains of constant, generalized pain. RN to give prn oxycodone.   I introduced Palliative Medicine as specialized medical care for people living with serious illness. It focuses on providing relief from the symptoms and stress of a serious illness. The goal is to improve quality of life for both the patient and the family.  We discussed a brief life review of the patient. Prior to admission, living with Nicole [name] and Nicole [name]. They report that she spends majority of her time in bed. She was recently hospitalized at [facility] with weakness and dehydration but no liver concerns at that time. She was uncooperative at rehab following that hospitalization. Oral intake is poor. She continues to drink ETOH and son reports approximately 1/2 liter of vodka daily. Per family, she drinks because her chronic  pain is not managed well. She has chronic generalized pain from falls and various history of surgeries.   Discussed events leading up to admission and course of hospitalization including diagnoses, interventions, plan of care. Reviewed recommendations from specialists and discussed tenuous clinical condition. Son and husband do seem to understand how serious her condition is. Husband jokes about son donating his liver to Nicole [name]. Explained transplant process and unfortunately this is not an option (and not on the table for a long time if ever) with active alcohol use. Family understands. Discussed importance of sobriety moving forward. Discussed medical management and watchful waiting for outcomes, but again very tenuous condition.  I attempted to elicit values and goals of care important to the patient and family. Family reports she has a documented living will with Nicole [name] as documented HCPOA. They are searching for this paperwork. Nicole [name] shares his belief that his mother has previously spoken of her desire for DNR. He shares his disagreement with this decision but his wish to honor her decision if she has this clearly documented.   Medically recommended consideration of DNR/DNI code status with tenuous condition and high risk for acute decline. Frankly and compassionately shared that if her condition took a turn for the worst, it is unlikely she would survive aggressive measures such as  CPR/ventilator support with current liver condition. That this would only prolong her suffering. Nicole [name] and Nicole Noa do seem to understand this but Nicole [name] does wish to further discuss with his two brothers before any decisions are made.   Discussed MOST form and consideration of DNR/DNI with ongoing medical management, watchful waiting, and time for outcomes with GI recommendations.   Questions and concerns were addressed.  Hard Choices booklet left for review. PMT contact information given. PMT provider reassured  family of f/u tomorrow 9/22.   SUMMARY OF RECOMMENDATIONS    Continue full code/full scope treatment.  Family reports son, Nicole [name] is documented HCPOA. Family is searching for patient's documentation.   Ongoing palliative discussions. Recommended consideration of DNR/DNI with tenuous condition. Son Nicole [name] wishes to discuss with his two brothers before making any decisions.   PMT provider will continue to follow inpatient. Will f/u with family tomorrow 9/22.   Code Status/Advance Care Planning:  Full code  Symptom Management:   Per attending  Palliative Prophylaxis:   Aspiration, Delirium Protocol, Oral Care and Turn Reposition  Psycho-social/Spiritual:   Desire for further Chaplaincy support: yes  Additional Recommendations: Caregiving  Support/Resources  Prognosis:   Guarded. Tenuous clinical condition with high risk for decompensation.  Discharge Planning: To Be Determined      Primary Diagnoses: Present on Admission: . Acute liver failure . Hepatic encephalopathy ([place]) . Hyponatremia . Epigastric abdominal tenderness . Diverticulosis of sigmoid colon . Chronic alcoholism ([place]) . Arthritis . Hypotension . Jaundice . Hyperbilirubinemia . Hyperammonemia ([place]) . Alcoholic hepatitis   I have reviewed the medical record, interviewed the patient and family, and examined the patient. The following aspects are pertinent.  Past Medical History:  Diagnosis Date  . Anemia   . Arthritis   . Asthma    as a child  . Atrial fibrillation with RVR ([place])   . Cancer ([place])    right kidney  . Chronic alcoholism ([place])   . Chronic left shoulder pain   . Dysrhythmia    Atrial Fib with RVR   . GERD (gastroesophageal reflux disease)   . Heartburn   . Hiatal hernia 12/17/2016   stomach noted in chest on CT scan  . History of blood transfusion   . Hypertension   . Memory loss   . Schizophrenia ([place])   . Seizures ([place])     had when she had period and when  she ovalate  . Thyroid nodule    Social History   Socioeconomic History  . Marital status: Single    Spouse name: Not on file  . Number of children: Not on file  . Years of education: Not on file  . Highest education level: Not on file  Occupational History  . Not on file  Tobacco Use  . Smoking status: Former Smoker    Years: 15.00    Types: Cigarettes    Quit date: 11/14/2007    Years since quitting: 11.9  . Smokeless tobacco: Never Used  Vaping Use  . Vaping Use: Never used  Substance and Sexual Activity  . Alcohol use: Not Currently    Comment: none since fall 11/13/17  . Drug use: No  . Sexual activity: Not Currently  Other Topics Concern  . Not on file  Social History Narrative   quit drinking in late August but went on a binge over last weekend (october 26, 26, 28).  Hasn't drank since Sunday (6 days ago) due to abd pain, N, V   Social  Determinants of Health   Financial Resource Strain:   . Difficulty of Paying Living Expenses: Not on file  Food Insecurity:   . Worried About Charity fundraiser in the Last Year: Not on file  . Ran Out of Food in the Last Year: Not on file  Transportation Needs:   . Lack of Transportation (Medical): Not on file  . Lack of Transportation (Non-Medical): Not on file  Physical Activity:   . Days of Exercise per Week: Not on file  . Minutes of Exercise per Session: Not on file  Stress:   . Feeling of Stress : Not on file  Social Connections:   . Frequency of Communication with Friends and Family: Not on file  . Frequency of Social Gatherings with Friends and Family: Not on file  . Attends Religious Services: Not on file  . Active Member of Clubs or Organizations: Not on file  . Attends Archivist Meetings: Not on file  . Marital Status: Not on file   Family History  Problem Relation Age of Onset  . Stroke Mother   . Heart attack Father   . Ulcers Father   . Diabetes Sister   . Cancer Brother   . Diabetes Sister     . Diabetes Sister    Scheduled Meds: . sodium chloride   Intravenous Once  . sodium chloride   Intravenous Once  . Chlorhexidine Gluconate Cloth  6 each Topical Daily  . ferrous sulfate  325 mg Oral Q breakfast  . folic acid  1 mg Oral Daily  . lactulose  20 g Oral TID  . metoprolol tartrate  50 mg Oral BID  . multivitamin with minerals  1 tablet Oral Daily  . predniSONE  40 mg Oral Daily  . rifaximin  550 mg Oral BID  . sodium chloride flush  10-40 mL Intracatheter Q12H  . thiamine  100 mg Oral Daily   Or  . thiamine  100 mg Intravenous Daily   Continuous Infusions: . sodium chloride 60 mL/hr at 11/04/19 1844   PRN Meds:.ondansetron **OR** ondansetron (ZOFRAN) IV, sodium chloride flush Medications Prior to Admission:  Prior to Admission medications   Medication Sig Start Date End Date Taking? Authorizing Provider  Calcium Carbonate-Vitamin D (CALCIUM 500 + D PO) Take 1 tablet by mouth daily.   Yes [provider]  ferrous sulfate 325 (65 FE) MG tablet Take 325 mg by mouth daily with breakfast.    Yes [provider]  Magnesium 250 MG TABS Take 2 tablets by mouth daily.   Yes [provider]  metoprolol tartrate (LOPRESSOR) 50 MG tablet Take 50 mg by mouth 2 (two) times daily.   Yes [provider]  Multiple Vitamins-Minerals (MULTIVITAMIN WITH MINERALS) tablet Take 1 tablet by mouth daily.   Yes [provider]  omeprazole (PRILOSEC) 20 MG capsule Take 20 mg by mouth daily.   Yes [provider]  potassium gluconate (HM POTASSIUM) 595 (99 K) MG TABS tablet Take 595 mg by mouth daily.   Yes [provider]  Propylene Glycol (SYSTANE COMPLETE) 0.6 % SOLN Place 1 drop into both eyes See admin instructions. Instill one drop into both eyes every morning, may also use later in the day as needed for dry eyes   Yes [provider]   Allergies  Allergen Reactions  . Other Shortness Of Breath and Anxiety     UNSPECIFIED REACTION to "fast foods and processed food"  . Shellfish  Allergy Shortness Of Breath  . Iodinated Diagnostic Agents Swelling    Hand swelling from contrast dye for MRI   Review of Systems  Unable to perform ROS: Mental status change   Physical Exam Vitals and nursing note reviewed.  Constitutional:      Appearance: She is ill-appearing.  HENT:     Head:     Comments: scabs Eyes:     General: Scleral icterus present.  Pulmonary:     Effort: Accessory muscle usage present. No tachypnea or respiratory distress.  Abdominal:     General: There is distension.     Tenderness: There is no abdominal tenderness.  Skin:    General: Skin is warm and dry.     Coloration: Skin is jaundiced.  Neurological:     Mental Status: She is easily aroused.     Comments: Oriented to self, otherwise confused  Psychiatric:        Attention and Perception: She is inattentive.        Speech: Speech is delayed.        Cognition and Memory: Cognition is impaired.     Vital Signs: BP (!) 133/91 (BP Location: Left Arm)   Pulse 77   Temp 97.9 F (36.6 C)   Resp 18   Ht 5' 8"  (1.727 m)   Wt 84.7 kg   SpO2 92%   BMI 28.39 kg/m  Pain Scale: 0-10   Pain Score: 0-No pain   SpO2: SpO2: 92 % O2 Device:SpO2: 92 % O2 Flow Rate: .   IO: Intake/output summary: No intake or output data in the 24 hours ending 11/05/19 1522  LBM: Last BM Date: 11/03/19 Baseline Weight: Weight: 87 kg Most recent weight: Weight: 84.7 kg     Palliative Assessment/Data: PPS 30%   Flowsheet Rows     Most Recent Value  Intake Tab  Referral Department Hospitalist  Unit at Time of Referral Med/Surg Unit  Palliative Care Primary Diagnosis Other (Comment)  Palliative Care Type New Palliative care  Reason for referral Clarify Goals of Care  Date first seen by Palliative Care 11/05/19  Clinical Assessment  Palliative Performance Scale Score 30%  Psychosocial & Spiritual Assessment  Palliative Care Outcomes    Patient/Family meeting held? Yes  Who was at the meeting? husband and son  Palliative Care Outcomes Clarified goals of care, Provided psychosocial or spiritual support, ACP counseling assistance      Time Total: 31mn Greater than 50%  of this time was spent counseling and coordinating care related to the above assessment and plan.  Signed by:  MIhor Dow DNP, FNP-C Palliative Medicine Team  Phone: [phone]: [phone]  Please contact Palliative Medicine Team phone at [phone] questions and concerns.  For individual provider: See AShea Evans

## 2019-11-06 LAB — URINALYSIS, COMPLETE (UACMP) WITH MICROSCOPIC
Glucose, UA: NEGATIVE mg/dL
Ketones, ur: NEGATIVE mg/dL
Nitrite: POSITIVE — AB
Protein, ur: NEGATIVE mg/dL
Specific Gravity, Urine: 1.017 (ref 1.005–1.030)
WBC, UA: >50 WBC/hpf — ABNORMAL HIGH (ref 0–5)
pH: 5 (ref 5.0–8.0)

## 2019-11-06 LAB — COMPREHENSIVE METABOLIC PANEL
ALT: 64 U/L — ABNORMAL HIGH (ref 0–44)
AST: 85 U/L — ABNORMAL HIGH (ref 15–41)
Albumin: 2.8 g/dL — ABNORMAL LOW (ref 3.5–5.0)
Alkaline Phosphatase: 272 U/L — ABNORMAL HIGH (ref 38–126)
Anion gap: 13 (ref 5–15)
BUN: 47 mg/dL — ABNORMAL HIGH (ref 8–23)
CO2: 16 mmol/L — ABNORMAL LOW (ref 22–32)
Calcium: 9 mg/dL (ref 8.9–10.3)
Chloride: 104 mmol/L (ref 98–111)
Creatinine, Ser: 0.99 mg/dL (ref 0.44–1.00)
GFR calc Af Amer: >60 mL/min (ref >60)
GFR calc non Af Amer: 58 mL/min — ABNORMAL LOW (ref >60)
Glucose, Bld: 123 mg/dL — ABNORMAL HIGH (ref 70–99)
Potassium: 3.8 mmol/L (ref 3.5–5.1)
Sodium: 133 mmol/L — ABNORMAL LOW (ref 135–145)
Total Bilirubin: 21.2 mg/dL (ref 0.3–1.2)
Total Protein: 5.4 g/dL — ABNORMAL LOW (ref 6.5–8.1)

## 2019-11-06 LAB — CBC
HCT: 25.7 % — ABNORMAL LOW (ref 36.0–46.0)
Hemoglobin: 8.3 g/dL — ABNORMAL LOW (ref 12.0–15.0)
MCH: 31.6 pg (ref 26.0–34.0)
MCHC: 32.3 g/dL (ref 30.0–36.0)
MCV: 97.7 fL (ref 80.0–100.0)
Platelets: 115 10*3/uL — ABNORMAL LOW (ref 150–400)
RBC: 2.63 MIL/uL — ABNORMAL LOW (ref 3.87–5.11)
RDW: 21.5 % — ABNORMAL HIGH (ref 11.5–15.5)
WBC: 9.4 10*3/uL (ref 4.0–10.5)
nRBC: 0.4 % — ABNORMAL HIGH (ref 0.0–0.2)

## 2019-11-06 MED ORDER — THIAMINE HCL 100 MG/ML IJ SOLN
500.0000 mg | Freq: Three times a day (TID) | INTRAVENOUS | Status: DC
  Filled 2019-11-06 (×6): qty 5

## 2019-11-06 MED ORDER — KETOROLAC TROMETHAMINE 10 MG PO TABS
10.0000 mg | ORAL_TABLET | Freq: Once | ORAL | Status: AC
  Administered 2019-11-06: 10 mg via ORAL
  Filled 2019-11-06: qty 1

## 2019-11-06 MED ORDER — HYDROXYZINE HCL 10 MG PO TABS
10.0000 mg | ORAL_TABLET | Freq: Once | ORAL | Status: AC
  Administered 2019-11-06: 10 mg via ORAL
  Filled 2019-11-06: qty 1

## 2019-11-06 NOTE — Progress Notes (Signed)
GI Inpatient Follow-up Note  Patient Identification: Nicole [name] is a 69 y.o. female with PMHx of atrial fibrillation, GERD, large hiatal hernia, chronic alcohol abuse, history of renal cell carcinoma status post right nephrectomy in August 2019, seizure disorder,history of pancreatic pseudocyst, who was admitted to the hospital after presenting new onset jaundice and weakness/  Subjective: Orientated to person, place and time but unable to get any adequate history.  Did discuss with the nurse, she pulled out her PICC line overnight.  States she is hungry. Has rectal tube - has brown stool in tubing but none in bag.  Scheduled Inpatient Medications:  . sodium chloride   Intravenous Once  . sodium chloride   Intravenous Once  . Chlorhexidine Gluconate Cloth  6 each Topical Daily  . ferrous sulfate  325 mg Oral Q breakfast  . folic acid  1 mg Oral Daily  . lactulose  20 g Oral TID  . metoprolol tartrate  50 mg Oral BID  . multivitamin with minerals  1 tablet Oral Daily  . pantoprazole (PROTONIX) IV  40 mg Intravenous Q24H  . predniSONE  40 mg Oral Daily  . rifaximin  550 mg Oral BID  . sodium chloride flush  10-40 mL Intracatheter Q12H  . thiamine  100 mg Oral Daily   Or  . thiamine  100 mg Intravenous Daily    Continuous Inpatient Infusions:   . sodium chloride 60 mL/hr at 11/05/19 1702    PRN Inpatient Medications:  acetaminophen, diphenhydrAMINE, ketorolac, ondansetron **OR** ondansetron (ZOFRAN) IV, sodium chloride flush  Review of Systems: Constitutional: Weight is stable.  Eyes: No changes in vision. ENT: No oral lesions, sore throat.  GI: see HPI.  Heme/Lymph: No easy bruising.  CV: No chest pain.  GU: No hematuria.  Integumentary: No rashes.  Neuro: No headaches.  Psych: No depression/anxiety.  Endocrine: No heat/cold intolerance.  Allergic/Immunologic: No urticaria.  Resp: No cough, SOB.  Musculoskeletal: No joint swelling.    Physical Examination: BP  115/73 (BP Location: Left Arm)   Pulse 81   Temp 97.8 F (36.6 C)   Resp 16   Ht 5\' 8"  (1.727 m)   Wt 82.7 kg   SpO2 92%   BMI 27.72 kg/m  Gen: NAD, alert and oriented x 4 but not able to provide history. Jaundiced. Chronically ill appearing.  HEENT: PEERLA, EOMI, Neck: supple, no JVD or thyromegaly Chest: CTA bilaterally, no wheezes, crackles, or other adventitious sounds CV: RRR, no m/g/c/r Abd: soft, NT, ND, +BS in all four quadrants; no HSM, guarding, ridigity, or rebound tenderness. Bruising in lower abd Ext: no edema, well perfused with 2+ pulses, Skin: no rash or lesions noted Lymph: no LAD  Data: Lab Results  Component Value Date   WBC 9.4 11/06/2019   HGB 8.3 (L) 11/06/2019   HCT 25.7 (L) 11/06/2019   MCV 97.7 11/06/2019   PLT 115 (L) 11/06/2019   Recent Labs  Lab 11/04/19 0626 11/05/19 0921 11/06/19 0535  HGB 7.1* 8.6* 8.3*   Lab Results  Component Value Date   NA 133 (L) 11/06/2019   K 3.8 11/06/2019   CL 104 11/06/2019   CO2 16 (L) 11/06/2019   BUN 47 (H) 11/06/2019   CREATININE 0.99 11/06/2019   Lab Results  Component Value Date   ALT 64 (H) 11/06/2019   AST 85 (H) 11/06/2019   ALKPHOS 272 (H) 11/06/2019   BILITOT 21.2 (HH) 11/06/2019   Recent Labs  Lab 11/05/19 0921  INR  1.2  Was 1.6 on admisison   Ammonia 59 yesterday (9-35)  11/05/19 IMPRESSION: 1. Interval right-sided PICC line removal. 2. Stable right basilar atelectasis, with stable left basilar consolidation and left pleural effusion.   Assessment/Plan: [name] is a 69 y.o. female admitted for alcoholic hepatitis.  Patient had Madrey score of 37 and is currently on day 4 of prednisone.  Current meld is 24, bilirubin remained elevated above 20.  She had opiates yesterday and had worsening mental status. She is less solmnolent today but still confused.  She was increased to lactulose 20 mg 3 times daily and Xifaxan 550 mg twice daily.  Continue on prednisone 40 mg daily. Blood  cultures yesterday w/ no growth. UA has not resulted.  Given her lack of improvement in her bilirubin would consider discussing palliative care with her family, Dr Laural Golden to discuss further.   Recommendations:  -Add on INR ammonia to tomorrow AM labs.  -Continue lactulose, Xifaxan, prednisone.  Please call with questions or concerns.    Ronney Asters, PA-C [facility]

## 2019-11-06 NOTE — Progress Notes (Signed)
PROGRESS NOTE  Nicole [name] [id number] DOB: 1950-08-16 DOA: 10/31/2019 PCP: Rosita Fire, MD  Brief History:  69 y.o.female,with history of renal cell carcinoma status post right nephrectomy in August 2019thyroid nodule, seizures, schizophrenia, HTN, GERD, and Afib who presents to the ED at the recommendation of her PCP. Patient reports that she made an appt with PCP for chronic leg pain, and she was told to come to the ED for jaundice. Patient reports that she had not noticed any change in color at home. Patient does report that she is an alcoholic. She reports that she drinks too much too often.  She was admitted with jaundice and findings of alcoholic hepatitis.  Since admission, the patient was started on lactulose for hepatic encephalopathy. GI was consulted to assist with management.  Assessment/Plan: Decompensated alcoholic liver disease/alcoholic hepatitis -Maddrey's discriminant score = 37 -continue prednisone D#4 -appreciate GI help  Hepatic Encephalopathy -continue lactulose -dose increased to 20 g tid -am ammonia -continue rifaximin  Acute metabolic Encephalopathy -multifactorial including hyperammonemia and likely Wernicke/Korsakoff encephalopathy -start high dose thiamine  -remains confused/somnolent -11/05/19 eve--pulled out PICC line -obtain UA and culture -TSH 6.318, Free T4--0.79 -B12 -folate  Anemia of Chronic Disease -received 2 units PRBC this admission  AKI -due to volume depletion -serum creatinine peaked 1.80 -improved with IVF and PRBCs -baseline creatinine 0.9-1.1  Thrombocytopenia -due to Etoh liver disease -daily CBC  Hyponatremia -due to chronic liver disease -monitor BMPs  Essential Hypertension -continue metoprolol  Goals of care -palliative medicine consulted -11/06/19 family meeting-->DNR; MOST form completed       Status is: Inpatient  Remains inpatient appropriate because:Altered mental status and IV  treatments appropriate due to intensity of illness or inability to take PO   Dispo: The patient is from: Home              Anticipated d/c is to: SNF              Anticipated d/c date is: 2 days              Patient currently is not medically stable to d/c.        Family Communication:   Family at bedside  Consultants:  GI, palliative medicine  Code Status:  DNR  DVT Prophylaxis:  SCDs   Procedures: As Listed in Progress Note Above  Antibiotics: None       Subjective:  Patient is drowsy but arouses easily.  Only intermittently answers questions.  Denies cp, sob, abd pain, vomiting.f/c Objective: Vitals:   11/05/19 1600 11/06/19 0500 11/06/19 0611 11/06/19 1200  BP: 123/86  115/73 121/79  Pulse: 75  81 79  Resp: 18  16 18   Temp: 98.7 F (37.1 C)  97.8 F (36.6 C) 98.2 F (36.8 C)  TempSrc: Oral   Oral  SpO2: 94%  92% 94%  Weight:  82.7 kg    Height:        Intake/Output Summary (Last 24 hours) at 11/06/2019 1433 Last data filed at 11/06/2019 0400 Gross per 24 hour  Intake 1083.62 ml  Output 500 ml  Net 583.62 ml   Weight change: -2 kg Exam:   General:  Pt is alert,intermittently follows commands appropriately, not in acute distress  HEENT:+ icterus, No thrush, No neck mass, [place]/AT  Cardiovascular: RRR, S1/S2, no rubs, no gallops  Respiratory: bibasilar rales. No wheeze  Abdomen: Soft/+BS, non tender, non distended, no guarding  Extremities: No edema, No  lymphangitis, No petechiae, No rashes, no synovitis   Data Reviewed: I have personally reviewed following labs and imaging studies Basic Metabolic Panel: Recent Labs  Lab 11/01/19 0328 11/01/19 0328 11/02/19 0751 11/02/19 0751 11/03/19 0740 11/04/19 0626 11/04/19 1738 11/05/19 0921 11/06/19 0535  NA 123*   < > 127*   < > 128* 132* 131* 133* 133*  K 3.6   < > 3.3*   < > 4.0 3.8 3.5 3.7 3.8  CL 93*   < > 96*   < > 98 101 101 103 104  CO2 20*   < > 19*   < > 16* 19* 20* 18* 16*    GLUCOSE 70   < > 100*   < > 137* 131* 114* 127* 123*  BUN 30*   < > 33*   < > 35* 36* 40* 43* 47*  CREATININE 1.93*   < > 1.73*   < > 1.35* 1.16* 1.10* 1.05* 0.99  CALCIUM 7.7*   < > 8.0*   < > 8.4* 8.7* 8.5* 8.7* 9.0  MG 1.6*  --  2.0  --  2.1 2.2  --   --   --   PHOS 3.7  --   --   --   --   --   --   --   --    < > = values in this interval not displayed.   Liver Function Tests: Recent Labs  Lab 11/03/19 0740 11/04/19 0626 11/04/19 1738 11/05/19 0921 11/06/19 0535  AST 94* 75* 78* 83* 85*  ALT 62* 54* 56* 59* 64*  ALKPHOS 318* 264* 272* 289* 272*  BILITOT 20.3* 21.0* 21.0* 22.0* 21.2*  PROT 5.2* 5.5* 5.5* 5.8* 5.4*  ALBUMIN 2.3* 2.8* 2.8* 2.8* 2.8*   Recent Labs  Lab 11/01/19 1214 11/02/19 0751 11/03/19 0740 11/04/19 0626  LIPASE 93* 122* 78* 61*   Recent Labs  Lab 10/31/19 1636 11/01/19 1023 11/02/19 0751 11/05/19 0921  AMMONIA 70* 57* 71* 59*   Coagulation Profile: Recent Labs  Lab 10/31/19 1636 11/02/19 0751 11/03/19 0740 11/04/19 0626 11/05/19 0921  INR 1.6* 1.7* 1.4* 1.3* 1.2   CBC: Recent Labs  Lab 10/31/19 1636 11/01/19 0328 11/02/19 0751 11/03/19 0740 11/04/19 0626 11/05/19 0921 11/06/19 0535  WBC 6.7   < > 5.5 4.3 5.3 9.6 9.4  NEUTROABS 5.4  --   --   --   --   --   --   HGB 10.0*   < > 8.1* 7.0* 7.1* 8.6* 8.3*  HCT 29.8*   < > 26.0* 22.2* 22.5* 26.1* 25.7*  MCV 98.3   < > 105.3* 104.2* 97.8 97.0 97.7  PLT 142*   < > 102* 96* 93* 120* 115*   < > = values in this interval not displayed.   Cardiac Enzymes: No results for input(s): CKTOTAL, CKMB, CKMBINDEX, TROPONINI in the last 168 hours. BNP: Invalid input(s): POCBNP CBG: No results for input(s): GLUCAP in the last 168 hours. HbA1C: No results for input(s): HGBA1C in the last 72 hours. Urine analysis:    Component Value Date/Time   COLORURINE YELLOW 12/17/2016 0200   APPEARANCEUR CLEAR 12/17/2016 0200   LABSPEC 1.020 12/17/2016 0200   PHURINE 6.0 12/17/2016 0200   GLUCOSEU  NEGATIVE 12/17/2016 0200   HGBUR SMALL (A) 12/17/2016 0200   BILIRUBINUR NEGATIVE 12/17/2016 0200   KETONESUR 80 (A) 12/17/2016 0200   PROTEINUR 30 (A) 12/17/2016 0200   NITRITE NEGATIVE 12/17/2016 0200   LEUKOCYTESUR NEGATIVE  12/17/2016 0200   Sepsis Labs: @LABRCNTIP (procalcitonin:4,lacticidven:4) ) Recent Results (from the past 240 hour(s))  SARS Coronavirus 2 by RT PCR (hospital order, performed in [facility] hospital lab) Nasopharyngeal Nasopharyngeal Swab     Status: None   Collection Time: 10/31/19 11:20 PM   Specimen: Nasopharyngeal Swab  Result Value Ref Range Status   SARS Coronavirus 2 NEGATIVE NEGATIVE Final    Comment: (NOTE) SARS-CoV-2 target nucleic acids are NOT DETECTED.  The SARS-CoV-2 RNA is generally detectable in upper and lower respiratory specimens during the acute phase of infection. The lowest concentration of SARS-CoV-2 viral copies this assay can detect is 250 copies / mL. A negative result does not preclude SARS-CoV-2 infection and should not be used as the sole basis for treatment or other patient management decisions.  A negative result may occur with improper specimen collection / handling, submission of specimen other than nasopharyngeal swab, presence of viral mutation(s) within the areas targeted by this assay, and inadequate number of viral copies (<250 copies / mL). A negative result must be combined with clinical observations, patient history, and epidemiological information.  Fact Sheet for Patients:   StrictlyIdeas.no  Fact Sheet for Healthcare Providers: BankingDealers.co.za  This test is not yet approved or  cleared by the [place] FDA and has been authorized for detection and/or diagnosis of SARS-CoV-2 by FDA under an Emergency Use Authorization (EUA).  This EUA will remain in effect (meaning this test can be used) for the duration of the COVID-19 declaration under Section 564(b)(1)  of the Act, 21 U.S.C. section 360bbb-3(b)(1), unless the authorization is terminated or revoked sooner.  Performed at [facility], [address] [place], [place] [address]   Culture, blood (routine x 2)     Status: None (Preliminary result)   Collection Time: 11/05/19  3:12 PM   Specimen: BLOOD LEFT HAND  Result Value Ref Range Status   Specimen Description BLOOD LEFT HAND  Final   Special Requests   Final    BOTTLES DRAWN AEROBIC AND ANAEROBIC Blood Culture adequate volume   Culture   Final    NO GROWTH < 24 HOURS Performed at [facility], [address], [place], [place] [address]    Report Status PENDING  Incomplete  Culture, blood (routine x 2)     Status: None (Preliminary result)   Collection Time: 11/05/19  3:12 PM   Specimen: BLOOD LEFT HAND  Result Value Ref Range Status   Specimen Description BLOOD LEFT HAND  Final   Special Requests   Final    BOTTLES DRAWN AEROBIC AND ANAEROBIC Blood Culture adequate volume   Culture   Final    NO GROWTH < 24 HOURS Performed at [facility], [address], [place], [place] [address]    Report Status PENDING  Incomplete     Scheduled Meds: . sodium chloride   Intravenous Once  . sodium chloride   Intravenous Once  . Chlorhexidine Gluconate Cloth  6 each Topical Daily  . ferrous sulfate  325 mg Oral Q breakfast  . folic acid  1 mg Oral Daily  . hydrOXYzine  10 mg Oral Once  . ketorolac  10 mg Oral Once  . lactulose  20 g Oral TID  . metoprolol tartrate  50 mg Oral BID  . multivitamin with minerals  1 tablet Oral Daily  . pantoprazole (PROTONIX) IV  40 mg Intravenous Q24H  . predniSONE  40 mg Oral Daily  . rifaximin  550 mg Oral BID  .  sodium chloride flush  10-40 mL Intracatheter Q12H  . thiamine  100 mg Oral Daily   Or  . thiamine  100 mg Intravenous Daily   Continuous Infusions: . sodium chloride 60 mL/hr at 11/05/19 1702    Procedures/Studies: CT ABDOMEN PELVIS WO CONTRAST  Result Date: 10/31/2019 CLINICAL  DATA:  Weakness, diarrhea, jaundice, diffuse abdominal pain EXAM: CT ABDOMEN AND PELVIS WITHOUT CONTRAST TECHNIQUE: Multidetector CT imaging of the abdomen and pelvis was performed following the standard protocol without IV contrast. COMPARISON:  12/24/2016, 08/05/2019 FINDINGS: Lower chest: There is a large hiatal hernia containing the majority of the stomach. Trace left pleural effusion with compressive atelectasis in the left lower lobe. The right lung base is clear. Hepatobiliary: There is severe hepatic steatosis again noted, unchanged. No focal liver abnormality. Gallbladder is unremarkable. No biliary dilation. Pancreas: Pancreas is atrophic without focal abnormality. The complex fluid collection between the pancreatic tail and the left lobe liver on prior study has resolved in the interim. Spleen: Normal in size without focal abnormality. Adrenals/Urinary Tract: The right kidney and adrenal gland are surgically absent. Left kidney and adrenal are unremarkable. No urinary tract calculi or obstruction. Bladder is decompressed without focal abnormality. Stomach/Bowel: There is diffuse colonic diverticulosis without diverticulitis. No bowel obstruction or ileus. Normal appendix right lower quadrant. Vascular/Lymphatic: Aortic atherosclerosis. No enlarged abdominal or pelvic lymph nodes. Reproductive: Uterus and bilateral adnexa are unremarkable. Other: Trace simple appearing free fluid in the bilateral upper quadrants and lower pelvis. No free intraperitoneal gas. No abdominal wall hernia. Musculoskeletal: No acute or destructive bony lesions. Reconstructed images demonstrate no additional findings. IMPRESSION: 1. Interval resolution of the complex fluid collection between the pancreatic tail and left lobe liver. 2. Stable severe hepatic steatosis. 3. Large hiatal hernia. 4. Trace simple appearing free fluid in the bilateral upper quadrants and lower pelvis. 5. Diffuse colonic diverticulosis without  diverticulitis. 6. Aortic Atherosclerosis (ICD10-I70.0). Electronically Signed   By: Randa Ngo M.D.   On: 10/31/2019 22:22   US Venous Img Lower Bilateral (DVT)  Result Date: 11/01/2019 CLINICAL DATA:  Lower extremity edema. EXAM: BILATERAL LOWER EXTREMITY VENOUS DOPPLER ULTRASOUND TECHNIQUE: Gray-scale sonography with graded compression, as well as color Doppler and duplex ultrasound were performed to evaluate the lower extremity deep venous systems from the level of the common femoral vein and including the common femoral, femoral, profunda femoral, popliteal and calf veins including the posterior tibial, peroneal and gastrocnemius veins when visible. The superficial great saphenous vein was also interrogated. Spectral Doppler was utilized to evaluate flow at rest and with distal augmentation maneuvers in the common femoral, femoral and popliteal veins. COMPARISON:  None. FINDINGS: RIGHT LOWER EXTREMITY Common Femoral Vein: No evidence of thrombus. Normal compressibility, respiratory phasicity and response to augmentation. Saphenofemoral Junction: No evidence of thrombus. Normal compressibility and flow on color Doppler imaging. Profunda Femoral Vein: No evidence of thrombus. Normal compressibility and flow on color Doppler imaging. Femoral Vein: No evidence of thrombus. Normal compressibility, respiratory phasicity and response to augmentation. Popliteal Vein: No evidence of thrombus. Normal compressibility, respiratory phasicity and response to augmentation. Calf Veins: No evidence of thrombus. Normal compressibility and flow on color Doppler imaging. Superficial Great Saphenous Vein: No evidence of thrombus. Normal compressibility. Venous Reflux:  None. Other Findings:  None. LEFT LOWER EXTREMITY Common Femoral Vein: No evidence of thrombus. Normal compressibility, respiratory phasicity and response to augmentation. Saphenofemoral Junction: No evidence of thrombus. Normal compressibility and flow on  color Doppler imaging. Profunda Femoral Vein: No evidence  of thrombus. Normal compressibility and flow on color Doppler imaging. Femoral Vein: No evidence of thrombus. Normal compressibility, respiratory phasicity and response to augmentation. Popliteal Vein: No evidence of thrombus. Normal compressibility, respiratory phasicity and response to augmentation. Calf Veins: No evidence of thrombus. Normal compressibility and flow on color Doppler imaging. Superficial Great Saphenous Vein: No evidence of thrombus. Normal compressibility. Venous Reflux:  None. Other Findings:  None. IMPRESSION: No evidence of deep venous thrombosis in either lower extremity. Electronically Signed   By: Kerby Moors M.D.   On: 11/01/2019 10:06   DG CHEST PORT 1 VIEW  Result Date: 11/05/2019 CLINICAL DATA:  Patient pulled out PICC line. EXAM: PORTABLE CHEST 1 VIEW COMPARISON:  November 05, 2019 FINDINGS: The right-sided PICC line seen on the prior study has been removed. Mild, stable right basilar atelectasis is seen with a stable area of consolidation noted within the left lung base. A small, stable left pleural effusion is noted. No pneumothorax is identified. The cardiac silhouette is markedly enlarged. There is mild calcification of the aortic arch. Two radiopaque fixation plates and screws are seen overlying the mandibular symphysis. IMPRESSION: 1. Interval right-sided PICC line removal. 2. Stable right basilar atelectasis, with stable left basilar consolidation and left pleural effusion. Electronically Signed   By: Virgina Norfolk M.D.   On: 11/05/2019 23:35   DG CHEST PORT 1 VIEW  Result Date: 11/05/2019 CLINICAL DATA:  Altered mental status EXAM: PORTABLE CHEST 1 VIEW COMPARISON:  12/25/2016 chest radiograph.  12/20/2016 chest CT. FINDINGS: Right upper extremity PICC tip overlies the distal SVC. Right basilar atelectasis. Left basilar/retrocardiac opacities with small left pleural effusion. No pneumothorax. Multilevel  chronic left rib deformities. IMPRESSION: Left basilar consolidation and small left pleural effusion. Right basilar atelectasis. Electronically Signed   By: Primitivo Gauze M.D.   On: 11/05/2019 13:27   DG Shoulder Right Port  Result Date: 11/02/2019 CLINICAL DATA:  Right shoulder pain. EXAM: PORTABLE RIGHT SHOULDER COMPARISON:  None. FINDINGS: There is no evidence of fracture or dislocation. There is no evidence of arthropathy or other focal bone abnormality. A right-sided PICC line is in place. Soft tissues are otherwise unremarkable. IMPRESSION: Negative. Electronically Signed   By: Virgina Norfolk M.D.   On: 11/02/2019 19:30   [place] EKG SITE RITE  Result Date: 11/02/2019 If Site Rite image not attached, placement could not be confirmed due to current cardiac rhythm.  US Abdomen Limited RUQ  Result Date: 11/01/2019 CLINICAL DATA:  Acute liver failure. EXAM: ULTRASOUND ABDOMEN LIMITED RIGHT UPPER QUADRANT COMPARISON:  CT abdomen pelvis, 10/31/2019 FINDINGS: Gallbladder: No gallstones or wall thickening visualized. No sonographic Murphy sign noted by sonographer. Common bile duct: Diameter: 7 mm Liver: Marked increase in liver parenchymal echogenicity which leads to poor through transmission of the sound beam. Liver assessment is somewhat limited due to the poor penetration. No defined liver mass. Portal vein is patent on color Doppler imaging with normal direction of blood flow towards the liver. Other: Small amount of ascites noted along the inferior liver margin. IMPRESSION: 1. Marked increased liver parenchymal echogenicity consistent with extensive hepatic steatosis, which was also evident on the previous day's CT. 2. No acute findings. Electronically Signed   By: Lajean Manes M.D.   On: 11/01/2019 10:13    Orson Eva, DO  [facility]  If 7PM-7AM, please contact night-coverage [email or web address] Password [facility] 11/06/2019, 2:33 PM   LOS: 6 days

## 2019-11-06 NOTE — Progress Notes (Signed)
Daily Progress Note   Patient Name: Nicole [name]       Date: 11/06/2019 DOB: 1950-08-27  Age: 69 y.o. MRN#: [id number] Attending Physician: Orson Eva, MD Primary Care Physician: Rosita Fire, MD Admit Date: 10/31/2019  Reason for Consultation/Follow-up: Establishing goals of care  Subjective: Patient awake but remains pleasantly confused and does not engage in [place] discussion. She complains of generalized pain is continuously moans. She states "you all don't know what suffering is."   GOC:  F/u with patient's son/POA Geanie Logan) and husband Atina Feeley) at bedside.   Provided update for the day. Discussed diagnoses, interventions, plan of care, tenuous clinical condition and guarded prognosis.   It was important to Legrand Como to speak with his two brothers before making any decisions. Legrand Como shares that he spoke with his brother's and they all agree for DNR/DNI code status as they do not wish to see their mother suffer.   Legrand Como is ready to complete MOST form. Decisions include: DNR/DNI, limited additional interventions and continued medical management, IVF/ABX if indicated, and NO feeding tube. Electronic [employer] MOST form completed. Durable DNR completed. Copies placed in chart and given to son.   Expressed concern with patient talking about suffering this afternoon and that prn oxycodone was discontinued yesterday with hopes she would be more awake and oriented. Reviewed GI note and concern that we may be nearing a place to focus on comfort, symptom management and her quality of life with consideration of outpatient hospice services. Legrand Como does wish to further discuss recommendations with GI MD prior to making further decisions. Discussed continued medical management at this time.    Answered questions and concerns. Family has PMT contact information.    Length of Stay: 6  Current Medications: Scheduled Meds:  . sodium chloride   Intravenous Once  . sodium chloride   Intravenous Once  . Chlorhexidine Gluconate Cloth  6 each Topical Daily  . ferrous sulfate  325 mg Oral Q breakfast  . folic acid  1 mg Oral Daily  . lactulose  20 g Oral TID  . metoprolol tartrate  50 mg Oral BID  . multivitamin with minerals  1 tablet Oral Daily  . pantoprazole (PROTONIX) IV  40 mg Intravenous Q24H  . predniSONE  40 mg Oral Daily  . rifaximin  550 mg Oral BID  .  sodium chloride flush  10-40 mL Intracatheter Q12H  . thiamine  100 mg Oral Daily   Or  . thiamine  100 mg Intravenous Daily    Continuous Infusions: . sodium chloride 60 mL/hr at 11/05/19 1702    PRN Meds: acetaminophen, diphenhydrAMINE, ketorolac, ondansetron **OR** ondansetron (ZOFRAN) IV, sodium chloride flush  Physical Exam Vitals and nursing note reviewed.  Constitutional:      Appearance: She is ill-appearing.  Eyes:     General: Scleral icterus present.  Pulmonary:     Effort: No tachypnea, accessory muscle usage or respiratory distress.  Abdominal:     General: There is distension.     Tenderness: There is no abdominal tenderness.  Skin:    General: Skin is warm and dry.     Coloration: Skin is jaundiced.  Neurological:     Mental Status: She is easily aroused.     Comments: Awake, confused, irritable  Psychiatric:        Attention and Perception: She is inattentive.        Speech: Speech is delayed.        Cognition and Memory: Cognition is impaired.            Vital Signs: BP 115/73 (BP Location: Left Arm)   Pulse 81   Temp 97.8 F (36.6 C)   Resp 16   Ht 5\' 8"  (1.727 m)   Wt 82.7 kg   SpO2 92%   BMI 27.72 kg/m  SpO2: SpO2: 92 % O2 Device: O2 Device: Room Air O2 Flow Rate:    Intake/output summary:   Intake/Output Summary (Last 24 hours) at 11/06/2019 1002 Last data filed  at 11/06/2019 0400 Gross per 24 hour  Intake 1083.62 ml  Output 500 ml  Net 583.62 ml   LBM: Last BM Date: 11/06/19 Baseline Weight: Weight: 87 kg Most recent weight: Weight: 82.7 kg       Palliative Assessment/Data: PPS 30%    Flowsheet Rows     Most Recent Value  Intake Tab  Referral Department Hospitalist  Unit at Time of Referral Med/Surg Unit  Palliative Care Primary Diagnosis Other (Comment)  Palliative Care Type New Palliative care  Reason for referral Clarify Goals of Care  Date first seen by Palliative Care 11/05/19  Clinical Assessment  Palliative Performance Scale Score 30%  Psychosocial & Spiritual Assessment  Palliative Care Outcomes  Patient/Family meeting held? Yes  Who was at the meeting? husband and son  Palliative Care Outcomes Clarified goals of care, Provided psychosocial or spiritual support, ACP counseling assistance      Patient Active Problem List   Diagnosis Date Noted  . Increased ammonia level   . Palliative care by specialist   . Goals of care, counseling/discussion   . AKI (acute kidney injury) ([place])   . Hepatic encephalopathy ([place]) 11/01/2019  . Hypotension 11/01/2019  . Jaundice 11/01/2019  . Hyperbilirubinemia 11/01/2019  . Hyperammonemia ([place]) 11/01/2019  . Alcoholic hepatitis 97/98/9211  . Acute liver failure 10/31/2019  . Acute respiratory failure with hypoxia ([place])   . Epigastric abdominal tenderness 12/18/2016  . Gastric mass 12/17/2016  . Right renal mass 12/17/2016  . Diverticulosis of sigmoid colon 12/17/2016  . Hiatal hernia 12/17/2016  . Chronic alcoholism ([place]) 12/17/2016  . Chronic left shoulder pain 12/17/2016  . Arthritis 12/17/2016  . Hyponatremia 12/17/2016  . Hypokalemia 12/17/2016  . Anemia 12/17/2016  . Atrial fibrillation with rapid ventricular response ([place]) 12/17/2016    Palliative Care Assessment & Plan  Patient Profile: 69 y.o. female  with past medical history of ETOH use, thyroid nodules,  seizures, schizophrenia, HTN, GERD, afib admitted on 10/31/2019 with jaundice from PCP office. Hospital admission for alcoholic hepatis, decompensated alcoholic liver disease, hepatic encephalopathy, hyponatremia. GI following. Bilirubin continues to rise up to 22. Receiving medical management. Prognosis is guarded. Current MELD score of 24. Palliative medicine consultation for goals of care.   Assessment: Acute alcoholic hepatitis Decompensated liver disease Acute hepatic encephalopathy Hyponatremia Hyperbilirubinemia Anemia of chronic disease ETOH use  Recommendations/Plan:  Son/POA Geanie Logan) spoke with his two brothers and they all agree on decision for DNR/DNI code status.   MOST form completed. Decisions include: DNR/DNI, limited additional interventions including ongoing medical management, IVF/ABX if indicated, NO feeding tube. Electronic Vyn[employer] MOST form and durable DNR completed.   Continue current plan of care and medical management. Watchful waiting. Time for outcomes.   Son requesting to speak with GI team. Updated GI PA and Dr. Laural Golden on family request.   PMT will follow inpatient.   Code Status: DNR/DNI   Code Status Orders  (From admission, onward)         Start     Ordered   11/01/19 0215  Full code  Continuous        11/01/19 0214        Code Status History    Date Active Date Inactive Code Status Order ID Comments User Context   12/17/2016 0917 12/27/2016 2336 Full Code [id number]  Lady Deutscher, MD ED   Advance Care Planning Activity       Prognosis:   Guarded. Tenuous clinical condition with high risk for decompensation.  Discharge Planning:  To Be Determined  Care plan was discussed with RN, son Legrand Como), husband Chrissie Noa), updated Dr. Carles Collet and GI via secure chat  Thank you for allowing the Palliative Medicine Team to assist in the care of this patient.   Time In: 1310 Time Out: 1355 Total Time 45 Prolonged Time Billed  no       Greater than 50%  of this time was spent counseling and coordinating care related to the above assessment and plan.  Ihor Dow, DNP, FNP-C Palliative Medicine Team  Phone: [phone] Fax: [phone]  Please contact Palliative Medicine Team phone at [phone] for questions and concerns.

## 2019-11-06 NOTE — Progress Notes (Signed)
MD made aware of Bilirubin 21.2. No new orders given.

## 2019-11-06 NOTE — Progress Notes (Signed)
PMT provider f/u [place] meeting with husband and son today 9/22 at [place], [place], [place] Palliative Medicine Team  Phone: [phone] Fax: [phone]

## 2019-11-06 NOTE — Progress Notes (Signed)
PT Cancellation Note  Patient Details Name: Nicole [name] MRN: [id number] DOB: Jul 01, 1950   Cancelled Treatment:    Reason Eval/Treat Not Completed: Patient declined, no reason specified. Pt declines therapy, wants to eat lunch. RN approves mitts removal. Will check back as schedule permits.    Talbot Grumbling PT, DPT 11/06/19, 12:11 PM [phone]

## 2019-11-07 LAB — CBC WITH DIFFERENTIAL/PLATELET
Band Neutrophils: 1 %
Basophils Absolute: 0 10*3/uL (ref 0.0–0.1)
Basophils Relative: 0 %
Eosinophils Absolute: 0 10*3/uL (ref 0.0–0.5)
Eosinophils Relative: 0 %
HCT: 27.5 % — ABNORMAL LOW (ref 36.0–46.0)
Hemoglobin: 8.8 g/dL — ABNORMAL LOW (ref 12.0–15.0)
Lymphocytes Relative: 3 %
Lymphs Abs: 0.2 10*3/uL — ABNORMAL LOW (ref 0.7–4.0)
MCH: 31 pg (ref 26.0–34.0)
MCHC: 32 g/dL (ref 30.0–36.0)
MCV: 96.8 fL (ref 80.0–100.0)
Metamyelocytes Relative: 2 %
Monocytes Absolute: 0.1 10*3/uL (ref 0.1–1.0)
Monocytes Relative: 1 %
Myelocytes: 3 %
Neutro Abs: 7.2 10*3/uL (ref 1.7–7.7)
Neutrophils Relative %: 88 %
Platelets: 107 10*3/uL — ABNORMAL LOW (ref 150–400)
Promyelocytes Relative: 2 %
RBC: 2.84 MIL/uL — ABNORMAL LOW (ref 3.87–5.11)
RDW: 21.1 % — ABNORMAL HIGH (ref 11.5–15.5)
WBC: 8.1 10*3/uL (ref 4.0–10.5)
nRBC: 0.7 % — ABNORMAL HIGH (ref 0.0–0.2)

## 2019-11-07 LAB — TYPE AND SCREEN
ABO/RH(D): B POS
Antibody Screen: POSITIVE
DAT, IgG: NEGATIVE
Donor AG Type: NEGATIVE
Donor AG Type: NEGATIVE
Donor AG Type: NEGATIVE
Unit division: 0
Unit division: 0
Unit division: 0

## 2019-11-07 LAB — BPAM RBC
Blood Product Expiration Date: 202110052359
Blood Product Expiration Date: 202110052359
Blood Product Expiration Date: 202110272359
ISSUE DATE / TIME: 202109192055
ISSUE DATE / TIME: 202109192055
Unit Type and Rh: 1700
Unit Type and Rh: 1700
Unit Type and Rh: 7300

## 2019-11-07 LAB — COMPREHENSIVE METABOLIC PANEL
ALT: 73 U/L — ABNORMAL HIGH (ref 0–44)
AST: 110 U/L — ABNORMAL HIGH (ref 15–41)
Albumin: 2.6 g/dL — ABNORMAL LOW (ref 3.5–5.0)
Alkaline Phosphatase: 283 U/L — ABNORMAL HIGH (ref 38–126)
Anion gap: 12 (ref 5–15)
BUN: 47 mg/dL — ABNORMAL HIGH (ref 8–23)
CO2: 18 mmol/L — ABNORMAL LOW (ref 22–32)
Calcium: 9.1 mg/dL (ref 8.9–10.3)
Chloride: 105 mmol/L (ref 98–111)
Creatinine, Ser: 0.97 mg/dL (ref 0.44–1.00)
GFR calc Af Amer: >60 mL/min (ref >60)
GFR calc non Af Amer: 59 mL/min — ABNORMAL LOW (ref >60)
Glucose, Bld: 127 mg/dL — ABNORMAL HIGH (ref 70–99)
Potassium: 3.6 mmol/L (ref 3.5–5.1)
Sodium: 135 mmol/L (ref 135–145)
Total Bilirubin: 21.2 mg/dL (ref 0.3–1.2)
Total Protein: 5.7 g/dL — ABNORMAL LOW (ref 6.5–8.1)

## 2019-11-07 LAB — PROTIME-INR
INR: 1.2 (ref 0.8–1.2)
Prothrombin Time: 14.3 seconds (ref 11.4–15.2)

## 2019-11-07 LAB — VITAMIN B12: Vitamin B-12: 1575 pg/mL — ABNORMAL HIGH (ref 180–914)

## 2019-11-07 LAB — FOLATE: Folate: 12 ng/mL (ref >5.9)

## 2019-11-07 LAB — AMMONIA: Ammonia: 52 umol/L — ABNORMAL HIGH (ref 9–35)

## 2019-11-07 LAB — MAGNESIUM: Magnesium: 2.4 mg/dL (ref 1.7–2.4)

## 2019-11-07 MED ORDER — THIAMINE HCL 100 MG PO TABS
500.0000 mg | ORAL_TABLET | Freq: Three times a day (TID) | ORAL | Status: DC
  Administered 2019-11-07 – 2019-11-08 (×5): 500 mg via ORAL
  Filled 2019-11-07 (×6): qty 5

## 2019-11-07 MED ORDER — LACTULOSE 10 GM/15ML PO SOLN
20.0000 g | Freq: Once | ORAL | Status: AC
  Administered 2019-11-07: 20 g via ORAL
  Filled 2019-11-07: qty 30

## 2019-11-07 MED ORDER — CEFDINIR 300 MG PO CAPS
300.0000 mg | ORAL_CAPSULE | Freq: Two times a day (BID) | ORAL | Status: DC
  Administered 2019-11-07 – 2019-11-09 (×5): 300 mg via ORAL
  Filled 2019-11-07 (×5): qty 1

## 2019-11-07 NOTE — Progress Notes (Signed)
Patient has pulled out multiple IV's, MD aware and states ok to leave IV out for now.

## 2019-11-07 NOTE — Progress Notes (Signed)
PROGRESS NOTE  Nicole [name] [id number] DOB: 08-03-1950 DOA: 10/31/2019 PCP: Rosita Fire, MD  Brief History:  69 y.o.female,with history of renal cell carcinoma status post right nephrectomy in August 2019thyroid nodule, seizures, schizophrenia, HTN, GERD, and Afib who presents to the ED at the recommendation of her PCP. Patient reports that she made an appt with PCP for chronic leg pain, and she was told to come to the ED for jaundice. Patient reports that she had not noticed any change in color at home. Patient does report that she is an alcoholic. She reports that she drinks too much too often. She was admitted with jaundice and findings of alcoholic hepatitis.  Since admission, the patient was started on lactulose for hepatic encephalopathy. GI was consulted to assist with management.  Assessment/Plan: Decompensated alcoholic liver disease/alcoholic hepatitis -Maddrey's discriminant score = 37 -continue prednisone D#5 -appreciate GI help  Hepatic Encephalopathy -continue lactulose--pt refused am 9/23, but agreed after discussion with her -dose increased to 20 g tid -am ammonia 71>>59>>52 -continue rifaximin  Acute metabolic Encephalopathy -multifactorial including hyperammonemia and likely Wernicke/Korsakoff encephalopathy -start high dose thiamine  -remains confused/somnolent -11/05/19 eve--pulled out PICC line -obtain UA >50 WBC -TSH 6.318, Free T4--0.79 -B12--1575 -folate--12.0  Pyuria -9/22 UA >50WBC -start cefdinir pending culture data  Anemia of Chronic Disease -received 2 units PRBC this admission  AKI -due to volume depletion -serum creatinine peaked 1.80 -improved with IVF and PRBCs -baseline creatinine 0.9-1.1  Thrombocytopenia -due to Etoh liver disease -daily CBC  Hyponatremia -due to chronic liver disease -monitor BMPs  Essential Hypertension -continue metoprolol  Goals of care -palliative medicine  consulted -11/06/19 family meeting-->DNR; MOST form completed -11/07/19--discussed GOC again with son--would consider residential hospice if GI feels prognosis is poor       Status is: Inpatient  Remains inpatient appropriate because:Altered mental status and IV treatments appropriate due to intensity of illness or inability to take PO   Dispo: The patient is from: Home  Anticipated d/c is to: SNF  Anticipated d/c date is: 2 days  Patient currently is not medically stable to d/c.        Family Communication:   son updated on phone 9/23  Consultants:  GI, palliative medicine  Code Status:  DNR  DVT Prophylaxis:  SCDs   Procedures: As Listed in Progress Note Above  Antibiotics: None    Total time spent 35 minutes.  Greater than 50% spent face to face counseling and coordinating care.    Subjective: Patient is more alert today.  She denies f/c, cp, sob, abd pain, n/v.  She has loose stools.  No dysuria  Objective: Vitals:   11/06/19 1200 11/06/19 1800 11/06/19 2109 11/07/19 0704  BP: 121/79 109/70 122/87 115/75  Pulse: 79 84 74 71  Resp: 18 18 20 16   Temp: 98.2 F (36.8 C) 98.5 F (36.9 C) 98.2 F (36.8 C) 97.6 F (36.4 C)  TempSrc: Oral Oral  Oral  SpO2: 94% 95% 98% 98%  Weight:      Height:        Intake/Output Summary (Last 24 hours) at 11/07/2019 1118 Last data filed at 11/07/2019 0900 Gross per 24 hour  Intake 680 ml  Output 150 ml  Net 530 ml   Weight change:  Exam:   General:  Pt is alert, follows commands appropriately, not in acute distress  HEENT: No icterus, No thrush, No neck mass, [place]/AT  Cardiovascular: RRR, S1/S2, no rubs,  no gallops  Respiratory: bibasilar crackles. No wheeze  Abdomen: Soft/+BS, non tender, non distended, no guarding  Extremities: No edema, No lymphangitis, No petechiae, No rashes, no synovitis   Data Reviewed: I have personally reviewed  following labs and imaging studies Basic Metabolic Panel: Recent Labs  Lab 11/01/19 0328 11/01/19 0328 11/02/19 0751 11/02/19 0751 11/03/19 0740 11/03/19 0740 11/04/19 4010 11/04/19 1738 11/05/19 0921 11/06/19 0535 11/07/19 0640  NA 123*   < > 127*   < > 128*   < > 132* 131* 133* 133* 135  K 3.6   < > 3.3*   < > 4.0   < > 3.8 3.5 3.7 3.8 3.6  CL 93*   < > 96*   < > 98   < > 101 101 103 104 105  CO2 20*   < > 19*   < > 16*   < > 19* 20* 18* 16* 18*  GLUCOSE 70   < > 100*   < > 137*   < > 131* 114* 127* 123* 127*  BUN 30*   < > 33*   < > 35*   < > 36* 40* 43* 47* 47*  CREATININE 1.93*   < > 1.73*   < > 1.35*   < > 1.16* 1.10* 1.05* 0.99 0.97  CALCIUM 7.7*   < > 8.0*   < > 8.4*   < > 8.7* 8.5* 8.7* 9.0 9.1  MG 1.6*  --  2.0  --  2.1  --  2.2  --   --   --  2.4  PHOS 3.7  --   --   --   --   --   --   --   --   --   --    < > = values in this interval not displayed.   Liver Function Tests: Recent Labs  Lab 11/04/19 0626 11/04/19 1738 11/05/19 0921 11/06/19 0535 11/07/19 0640  AST 75* 78* 83* 85* 110*  ALT 54* 56* 59* 64* 73*  ALKPHOS 264* 272* 289* 272* 283*  BILITOT 21.0* 21.0* 22.0* 21.2* 21.2*  PROT 5.5* 5.5* 5.8* 5.4* 5.7*  ALBUMIN 2.8* 2.8* 2.8* 2.8* 2.6*   Recent Labs  Lab 11/01/19 1214 11/02/19 0751 11/03/19 0740 11/04/19 0626  LIPASE 93* 122* 78* 61*   Recent Labs  Lab 10/31/19 1636 11/01/19 1023 11/02/19 0751 11/05/19 0921 11/07/19 0640  AMMONIA 70* 57* 71* 59* 52*   Coagulation Profile: Recent Labs  Lab 11/02/19 0751 11/03/19 0740 11/04/19 0626 11/05/19 0921 11/07/19 0640  INR 1.7* 1.4* 1.3* 1.2 1.2   CBC: Recent Labs  Lab 10/31/19 1636 11/01/19 0328 11/03/19 0740 11/04/19 0626 11/05/19 0921 11/06/19 0535 11/07/19 0640  WBC 6.7   < > 4.3 5.3 9.6 9.4 8.1  NEUTROABS 5.4  --   --   --   --   --  7.2  HGB 10.0*   < > 7.0* 7.1* 8.6* 8.3* 8.8*  HCT 29.8*   < > 22.2* 22.5* 26.1* 25.7* 27.5*  MCV 98.3   < > 104.2* 97.8 97.0 97.7 96.8   PLT 142*   < > 96* 93* 120* 115* 107*   < > = values in this interval not displayed.   Cardiac Enzymes: No results for input(s): CKTOTAL, CKMB, CKMBINDEX, TROPONINI in the last 168 hours. BNP: Invalid input(s): POCBNP CBG: No results for input(s): GLUCAP in the last 168 hours. HbA1C: No results for input(s): HGBA1C in the last 72  hours. Urine analysis:    Component Value Date/Time   COLORURINE AMBER (A) 11/06/2019 1450   APPEARANCEUR CLOUDY (A) 11/06/2019 1450   LABSPEC 1.017 11/06/2019 1450   PHURINE 5.0 11/06/2019 1450   GLUCOSEU NEGATIVE 11/06/2019 1450   HGBUR SMALL (A) 11/06/2019 1450   BILIRUBINUR MODERATE (A) 11/06/2019 1450   KETONESUR NEGATIVE 11/06/2019 1450   PROTEINUR NEGATIVE 11/06/2019 1450   NITRITE POSITIVE (A) 11/06/2019 1450   LEUKOCYTESUR MODERATE (A) 11/06/2019 1450   Sepsis Labs: @LABRCNTIP (procalcitonin:4,lacticidven:4) ) Recent Results (from the past 240 hour(s))  SARS Coronavirus 2 by RT PCR (hospital order, performed in [facility] hospital lab) Nasopharyngeal Nasopharyngeal Swab     Status: None   Collection Time: 10/31/19 11:20 PM   Specimen: Nasopharyngeal Swab  Result Value Ref Range Status   SARS Coronavirus 2 NEGATIVE NEGATIVE Final    Comment: (NOTE) SARS-CoV-2 target nucleic acids are NOT DETECTED.  The SARS-CoV-2 RNA is generally detectable in upper and lower respiratory specimens during the acute phase of infection. The lowest concentration of SARS-CoV-2 viral copies this assay can detect is 250 copies / mL. A negative result does not preclude SARS-CoV-2 infection and should not be used as the sole basis for treatment or other patient management decisions.  A negative result may occur with improper specimen collection / handling, submission of specimen other than nasopharyngeal swab, presence of viral mutation(s) within the areas targeted by this assay, and inadequate number of viral copies (<250 copies / mL). A negative result must  be combined with clinical observations, patient history, and epidemiological information.  Fact Sheet for Patients:   StrictlyIdeas.no  Fact Sheet for Healthcare Providers: BankingDealers.co.za  This test is not yet approved or  cleared by the [place] FDA and has been authorized for detection and/or diagnosis of SARS-CoV-2 by FDA under an Emergency Use Authorization (EUA).  This EUA will remain in effect (meaning this test can be used) for the duration of the COVID-19 declaration under Section 564(b)(1) of the Act, 21 U.S.C. section 360bbb-3(b)(1), unless the authorization is terminated or revoked sooner.  Performed at [facility], [address], [place], [place] [address]   Culture, blood (routine x 2)     Status: None (Preliminary result)   Collection Time: 11/05/19  3:12 PM   Specimen: BLOOD LEFT HAND  Result Value Ref Range Status   Specimen Description BLOOD LEFT HAND  Final   Special Requests   Final    BOTTLES DRAWN AEROBIC AND ANAEROBIC Blood Culture adequate volume   Culture   Final    NO GROWTH 2 DAYS Performed at [facility], [address] [place], [place] [address]    Report Status PENDING  Incomplete  Culture, blood (routine x 2)     Status: None (Preliminary result)   Collection Time: 11/05/19  3:12 PM   Specimen: BLOOD LEFT HAND  Result Value Ref Range Status   Specimen Description BLOOD LEFT HAND  Final   Special Requests   Final    BOTTLES DRAWN AEROBIC AND ANAEROBIC Blood Culture adequate volume   Culture   Final    NO GROWTH 2 DAYS Performed at [facility], [address], [place],  [address]    Report Status PENDING  Incomplete     Scheduled Meds: . sodium chloride   Intravenous Once  . sodium chloride   Intravenous Once  . cefdinir  300 mg Oral Q12H  . Chlorhexidine Gluconate Cloth  6 each Topical Daily  . ferrous sulfate  325 mg Oral Q breakfast  . folic acid  1 mg Oral Daily  .  lactulose  20 g Oral TID  . metoprolol tartrate  50 mg Oral BID  . multivitamin with minerals  1 tablet Oral Daily  . pantoprazole (PROTONIX) IV  40 mg Intravenous Q24H  . predniSONE  40 mg Oral Daily  . rifaximin  550 mg Oral BID  . sodium chloride flush  10-40 mL Intracatheter Q12H   Continuous Infusions: . sodium chloride 60 mL/hr at 11/05/19 1702  . thiamine injection      Procedures/Studies: CT ABDOMEN PELVIS WO CONTRAST  Result Date: 10/31/2019 CLINICAL DATA:  Weakness, diarrhea, jaundice, diffuse abdominal pain EXAM: CT ABDOMEN AND PELVIS WITHOUT CONTRAST TECHNIQUE: Multidetector CT imaging of the abdomen and pelvis was performed following the standard protocol without IV contrast. COMPARISON:  12/24/2016, 08/05/2019 FINDINGS: Lower chest: There is a large hiatal hernia containing the majority of the stomach. Trace left pleural effusion with compressive atelectasis in the left lower lobe. The right lung base is clear. Hepatobiliary: There is severe hepatic steatosis again noted, unchanged. No focal liver abnormality. Gallbladder is unremarkable. No biliary dilation. Pancreas: Pancreas is atrophic without focal abnormality. The complex fluid collection between the pancreatic tail and the left lobe liver on prior study has resolved in the interim. Spleen: Normal in size without focal abnormality. Adrenals/Urinary Tract: The right kidney and adrenal gland are surgically absent. Left kidney and adrenal are unremarkable. No urinary tract calculi or obstruction. Bladder is decompressed without focal abnormality. Stomach/Bowel: There is diffuse colonic diverticulosis without diverticulitis. No bowel obstruction or ileus. Normal appendix right lower quadrant. Vascular/Lymphatic: Aortic atherosclerosis. No enlarged abdominal or pelvic lymph nodes. Reproductive: Uterus and bilateral adnexa are unremarkable. Other: Trace simple appearing free fluid in the bilateral upper quadrants and lower pelvis. No  free intraperitoneal gas. No abdominal wall hernia. Musculoskeletal: No acute or destructive bony lesions. Reconstructed images demonstrate no additional findings. IMPRESSION: 1. Interval resolution of the complex fluid collection between the pancreatic tail and left lobe liver. 2. Stable severe hepatic steatosis. 3. Large hiatal hernia. 4. Trace simple appearing free fluid in the bilateral upper quadrants and lower pelvis. 5. Diffuse colonic diverticulosis without diverticulitis. 6. Aortic Atherosclerosis (ICD10-I70.0). Electronically Signed   By: Randa Ngo M.D.   On: 10/31/2019 22:22   US Venous Img Lower Bilateral (DVT)  Result Date: 11/01/2019 CLINICAL DATA:  Lower extremity edema. EXAM: BILATERAL LOWER EXTREMITY VENOUS DOPPLER ULTRASOUND TECHNIQUE: Gray-scale sonography with graded compression, as well as color Doppler and duplex ultrasound were performed to evaluate the lower extremity deep venous systems from the level of the common femoral vein and including the common femoral, femoral, profunda femoral, popliteal and calf veins including the posterior tibial, peroneal and gastrocnemius veins when visible. The superficial great saphenous vein was also interrogated. Spectral Doppler was utilized to evaluate flow at rest and with distal augmentation maneuvers in the common femoral, femoral and popliteal veins. COMPARISON:  None. FINDINGS: RIGHT LOWER EXTREMITY Common Femoral Vein: No evidence of thrombus. Normal compressibility, respiratory phasicity and response to augmentation. Saphenofemoral Junction: No evidence of thrombus. Normal compressibility and flow on color Doppler imaging. Profunda Femoral Vein: No evidence of thrombus. Normal compressibility and flow on color Doppler imaging. Femoral Vein: No evidence of thrombus. Normal compressibility, respiratory phasicity and response to augmentation. Popliteal Vein: No evidence of thrombus. Normal compressibility, respiratory phasicity and response  to augmentation. Calf Veins: No evidence of thrombus. Normal compressibility and flow on color Doppler  imaging. Superficial Great Saphenous Vein: No evidence of thrombus. Normal compressibility. Venous Reflux:  None. Other Findings:  None. LEFT LOWER EXTREMITY Common Femoral Vein: No evidence of thrombus. Normal compressibility, respiratory phasicity and response to augmentation. Saphenofemoral Junction: No evidence of thrombus. Normal compressibility and flow on color Doppler imaging. Profunda Femoral Vein: No evidence of thrombus. Normal compressibility and flow on color Doppler imaging. Femoral Vein: No evidence of thrombus. Normal compressibility, respiratory phasicity and response to augmentation. Popliteal Vein: No evidence of thrombus. Normal compressibility, respiratory phasicity and response to augmentation. Calf Veins: No evidence of thrombus. Normal compressibility and flow on color Doppler imaging. Superficial Great Saphenous Vein: No evidence of thrombus. Normal compressibility. Venous Reflux:  None. Other Findings:  None. IMPRESSION: No evidence of deep venous thrombosis in either lower extremity. Electronically Signed   By: Kerby Moors M.D.   On: 11/01/2019 10:06   DG CHEST PORT 1 VIEW  Result Date: 11/05/2019 CLINICAL DATA:  Patient pulled out PICC line. EXAM: PORTABLE CHEST 1 VIEW COMPARISON:  November 05, 2019 FINDINGS: The right-sided PICC line seen on the prior study has been removed. Mild, stable right basilar atelectasis is seen with a stable area of consolidation noted within the left lung base. A small, stable left pleural effusion is noted. No pneumothorax is identified. The cardiac silhouette is markedly enlarged. There is mild calcification of the aortic arch. Two radiopaque fixation plates and screws are seen overlying the mandibular symphysis. IMPRESSION: 1. Interval right-sided PICC line removal. 2. Stable right basilar atelectasis, with stable left basilar consolidation and  left pleural effusion. Electronically Signed   By: Virgina Norfolk M.D.   On: 11/05/2019 23:35   DG CHEST PORT 1 VIEW  Result Date: 11/05/2019 CLINICAL DATA:  Altered mental status EXAM: PORTABLE CHEST 1 VIEW COMPARISON:  12/25/2016 chest radiograph.  12/20/2016 chest CT. FINDINGS: Right upper extremity PICC tip overlies the distal SVC. Right basilar atelectasis. Left basilar/retrocardiac opacities with small left pleural effusion. No pneumothorax. Multilevel chronic left rib deformities. IMPRESSION: Left basilar consolidation and small left pleural effusion. Right basilar atelectasis. Electronically Signed   By: Primitivo Gauze M.D.   On: 11/05/2019 13:27   DG Shoulder Right Port  Result Date: 11/02/2019 CLINICAL DATA:  Right shoulder pain. EXAM: PORTABLE RIGHT SHOULDER COMPARISON:  None. FINDINGS: There is no evidence of fracture or dislocation. There is no evidence of arthropathy or other focal bone abnormality. A right-sided PICC line is in place. Soft tissues are otherwise unremarkable. IMPRESSION: Negative. Electronically Signed   By: Virgina Norfolk M.D.   On: 11/02/2019 19:30   [place] EKG SITE RITE  Result Date: 11/02/2019 If Site Rite image not attached, placement could not be confirmed due to current cardiac rhythm.  US Abdomen Limited RUQ  Result Date: 11/01/2019 CLINICAL DATA:  Acute liver failure. EXAM: ULTRASOUND ABDOMEN LIMITED RIGHT UPPER QUADRANT COMPARISON:  CT abdomen pelvis, 10/31/2019 FINDINGS: Gallbladder: No gallstones or wall thickening visualized. No sonographic Murphy sign noted by sonographer. Common bile duct: Diameter: 7 mm Liver: Marked increase in liver parenchymal echogenicity which leads to poor through transmission of the sound beam. Liver assessment is somewhat limited due to the poor penetration. No defined liver mass. Portal vein is patent on color Doppler imaging with normal direction of blood flow towards the liver. Other: Small amount of ascites noted along  the inferior liver margin. IMPRESSION: 1. Marked increased liver parenchymal echogenicity consistent with extensive hepatic steatosis, which was also evident on the previous day's CT. 2. No  acute findings. Electronically Signed   By: Lajean Manes M.D.   On: 11/01/2019 10:13    Orson Eva, DO  [facility]  If 7PM-7AM, please contact night-coverage [email or web address] Password [email or web address] 11/07/2019, 11:18 AM   LOS: 7 days

## 2019-11-07 NOTE — Progress Notes (Signed)
Patient refused lactulose, explained thoroughly what the medication was for and patient returned explanation and still refused on the basis that it makes her poop. MD made aware.

## 2019-11-07 NOTE — Progress Notes (Signed)
Subjective:  Patient complains of polyarthralgias.  She is requesting pain medication to be determined by Dr. Shanon Brow Tat..  Biting to nursing staff she has been passing liquid stools.  No melena or rectal bleeding noted.  Patient is trying to eat when she is awake.  Current Medications:  Current Facility-Administered Medications:  .  0.9 %  sodium chloride infusion (Manually program via Guardrails IV Fluids), , Intravenous, Once, Johnson, Clanford L, MD .  0.9 %  sodium chloride infusion (Manually program via Guardrails IV Fluids), , Intravenous, Once, Johnson, Clanford L, MD .  0.9 %  sodium chloride infusion, , Intravenous, Continuous, Johnson, Clanford L, MD, Last Rate: 60 mL/hr at 11/05/19 1702, Rate Change at 11/05/19 1702 .  acetaminophen (TYLENOL) tablet 650 mg, 650 mg, Oral, Q6H PRN, Johnson, Clanford L, MD, 650 mg at 11/07/19 1658 .  cefdinir (OMNICEF) capsule 300 mg, 300 mg, Oral, Q12H, Tat, David, MD, 300 mg at 11/07/19 1015 .  Chlorhexidine Gluconate Cloth 2 % PADS 6 each, 6 each, Topical, Daily, Wynetta Emery, Clanford L, MD, 6 each at 11/05/19 0917 .  diphenhydrAMINE (BENADRYL) capsule 25 mg, 25 mg, Oral, Q8H PRN, Lang Snow, FNP, 25 mg at 11/07/19 0017 .  ferrous sulfate tablet 325 mg, 325 mg, Oral, Q breakfast, Johnson, Clanford L, MD, 325 mg at 11/07/19 0901 .  folic acid (FOLVITE) tablet 1 mg, 1 mg, Oral, Daily, Zierle-Ghosh, Asia B, DO, 1 mg at 11/07/19 0901 .  lactulose (CHRONULAC) 10 GM/15ML solution 20 g, 20 g, Oral, TID, Montez Morita, Daniel, MD, 20 g at 11/07/19 1700 .  metoprolol tartrate (LOPRESSOR) tablet 50 mg, 50 mg, Oral, BID, Zierle-Ghosh, Asia B, DO, 50 mg at 11/07/19 0901 .  multivitamin with minerals tablet 1 tablet, 1 tablet, Oral, Daily, Zierle-Ghosh, Asia B, DO, 1 tablet at 11/07/19 0901 .  ondansetron (ZOFRAN) tablet 4 mg, 4 mg, Oral, Q6H PRN **OR** ondansetron (ZOFRAN) injection 4 mg, 4 mg, Intravenous, Q6H PRN, Zierle-Ghosh, Asia B, DO .  pantoprazole  (PROTONIX) injection 40 mg, 40 mg, Intravenous, Q24H, Johnson, Clanford L, MD, 40 mg at 11/07/19 1650 .  predniSONE (DELTASONE) tablet 40 mg, 40 mg, Oral, Daily, Rourk, Cristopher Estimable, MD, 40 mg at 11/07/19 0901 .  rifaximin (XIFAXAN) tablet 550 mg, 550 mg, Oral, BID, Montez Morita, Quillian Quince, MD, 550 mg at 11/07/19 0901 .  sodium chloride flush (NS) 0.9 % injection 10-40 mL, 10-40 mL, Intracatheter, Q12H, Johnson, Clanford L, MD, 10 mL at 11/05/19 1341 .  sodium chloride flush (NS) 0.9 % injection 10-40 mL, 10-40 mL, Intracatheter, PRN, Johnson, Clanford L, MD .  thiamine tablet 500 mg, 500 mg, Oral, Q8H, Tat, David, MD, 500 mg at 11/07/19 1405   Objective: Blood pressure 112/78, pulse 76, temperature 98.3 F (36.8 C), temperature source Oral, resp. rate 16, height 5' 8"  (1.727 m), weight 82.7 kg, SpO2 98 %. Patient is alert and responds appropriate to questions Remains jaundiced. Abdomen is full but soft and nontender with no organomegaly or masses. she does not have peripheral edema.  Labs/studies Results:   CBC Latest Ref Rng & Units 11/07/2019 11/06/2019 11/05/2019  WBC 4.0 - 10.5 K/uL 8.1 9.4 9.6  Hemoglobin 12.0 - 15.0 g/dL 8.8(L) 8.3(L) 8.6(L)  Hematocrit 36 - 46 % 27.5(L) 25.7(L) 26.1(L)  Platelets 150 - 400 K/uL 107(L) 115(L) 120(L)    CMP Latest Ref Rng & Units 11/07/2019 11/06/2019 11/05/2019  Glucose 70 - 99 mg/dL 127(H) 123(H) 127(H)  BUN 8 - 23 mg/dL 47(H) 47(H) 43(H)  Creatinine 0.44 - 1.00 mg/dL 0.97 0.99 1.05(H)  Sodium 135 - 145 mmol/L 135 133(L) 133(L)  Potassium 3.5 - 5.1 mmol/L 3.6 3.8 3.7  Chloride 98 - 111 mmol/L 105 104 103  CO2 22 - 32 mmol/L 18(L) 16(L) 18(L)  Calcium 8.9 - 10.3 mg/dL 9.1 9.0 8.7(L)  Total Protein 6.5 - 8.1 g/dL 5.7(L) 5.4(L) 5.8(L)  Total Bilirubin 0.3 - 1.2 mg/dL 21.2(HH) 21.2(HH) 22.0(HH)  Alkaline Phos 38 - 126 U/L 283(H) 272(H) 289(H)  AST 15 - 41 U/L 110(H) 85(H) 83(H)  ALT 0 - 44 U/L 73(H) 64(H) 59(H)    Hepatic Function Latest Ref Rng &  Units 11/07/2019 11/06/2019 11/05/2019  Total Protein 6.5 - 8.1 g/dL 5.7(L) 5.4(L) 5.8(L)  Albumin 3.5 - 5.0 g/dL 2.6(L) 2.8(L) 2.8(L)  AST 15 - 41 U/L 110(H) 85(H) 83(H)  ALT 0 - 44 U/L 73(H) 64(H) 59(H)  Alk Phosphatase 38 - 126 U/L 283(H) 272(H) 289(H)  Total Bilirubin 0.3 - 1.2 mg/dL 21.2(HH) 21.2(HH) 22.0(HH)  Bilirubin, Direct 0.0 - 0.2 mg/dL - - -    INR 1.2. Serum ammonia 52. B12 level 1575 Folate level 12.0   Assessment:  #1. Decompensated alcoholic liver disease.  Patient has alcoholic hepatitis.  Suspect she may have underlying cirrhosis as well.  Cholestasis has not improved with prednisone.  INR has improved implying some recovery of hepatic function.  She has long way to go. Patient's condition discussed with her son Durinda Buzzelli.  While she is critically ill but she is stable and quite recovered from acute hepatic decompensation as long as she does not develop infection or other complications.  #2.  Hepatic encephalopathy.  Patient is on lactulose and Xifaxan.  She is refusing lactulose.  Her bowels are moving.  #3.  Anemia appears to be due to chronic disease.  Drop in hemoglobin would appear to be due to hydration and she also lost significant amount of blood when she pulled off scabs from her forehead.  She has received 2 unit of PRBCs.  No evidence of overt GI bleed.  #4.  Chronic pain secondary to arthritis.  Recommendations  Change pantoprazole to oral route. If patient needs analgesic consider low-dose narcotic without acetaminophen. CMET in am

## 2019-11-08 LAB — COMPREHENSIVE METABOLIC PANEL
ALT: 88 U/L — ABNORMAL HIGH (ref 0–44)
AST: 135 U/L — ABNORMAL HIGH (ref 15–41)
Albumin: 2.4 g/dL — ABNORMAL LOW (ref 3.5–5.0)
Alkaline Phosphatase: 308 U/L — ABNORMAL HIGH (ref 38–126)
Anion gap: 12 (ref 5–15)
BUN: 46 mg/dL — ABNORMAL HIGH (ref 8–23)
CO2: 18 mmol/L — ABNORMAL LOW (ref 22–32)
Calcium: 9.1 mg/dL (ref 8.9–10.3)
Chloride: 107 mmol/L (ref 98–111)
Creatinine, Ser: 0.86 mg/dL (ref 0.44–1.00)
GFR calc Af Amer: >60 mL/min (ref >60)
GFR calc non Af Amer: >60 mL/min (ref >60)
Glucose, Bld: 121 mg/dL — ABNORMAL HIGH (ref 70–99)
Potassium: 3.5 mmol/L (ref 3.5–5.1)
Sodium: 137 mmol/L (ref 135–145)
Total Bilirubin: 19.2 mg/dL (ref 0.3–1.2)
Total Protein: 5.5 g/dL — ABNORMAL LOW (ref 6.5–8.1)

## 2019-11-08 LAB — RESPIRATORY PANEL BY RT PCR (FLU A&B, COVID)
Influenza A by PCR: NEGATIVE
Influenza B by PCR: NEGATIVE
SARS Coronavirus 2 by RT PCR: NEGATIVE

## 2019-11-08 LAB — URINE CULTURE: Culture: >=100000 — AB

## 2019-11-08 LAB — AMMONIA: Ammonia: 26 umol/L (ref 9–35)

## 2019-11-08 MED ORDER — PANTOPRAZOLE SODIUM 40 MG PO TBEC
40.0000 mg | DELAYED_RELEASE_TABLET | Freq: Every day | ORAL | Status: DC
  Administered 2019-11-08 – 2019-11-09 (×2): 40 mg via ORAL
  Filled 2019-11-08 (×3): qty 1

## 2019-11-08 MED ORDER — LACTULOSE 10 GM/15ML PO SOLN
10.0000 g | Freq: Two times a day (BID) | ORAL | Status: DC
  Administered 2019-11-08 – 2019-11-09 (×3): 10 g via ORAL
  Filled 2019-11-08 (×2): qty 30

## 2019-11-08 MED ORDER — LACTULOSE 10 GM/15ML PO SOLN: 10.0000 g | Freq: Two times a day (BID) | ORAL | Status: DC

## 2019-11-08 NOTE — TOC Progression Note (Addendum)
Transition of Care [facility]) - Progression Note    Patient Details  Name: Torry Adamczak MRN: [id number] Date of Birth: September 02, 1950  Transition of Care [facility]) CM/SW [place], [place] Phone Number: 11/08/2019, 1:43 PM  Clinical Narrative:    CSW spoke with Jackelyn Poling at [facility] to see if there was a bed available. Debbie informed CSW that if pt has record of vaccination there would be a bed 9/25. If pt does not have vaccination card a quarantine bed will not be available until Sunday 9/26. CSW followed up with pts son Legrand Como concerning pts vaccination card. Pts son states it is on record at [facility]. Pts son gave CSW fax number and asked for cover sheet to be faxed. CSW followed up with [facility], they do not have record of vaccination. Pts son states it may be in pts wallet in hospital room. Pt reported card is in her wallet though she does not know where wallet is. TOC to follow  Addendum: 3:35pm: TOC received copy of pts vaccination card. CSW updated Debbie with [facility]. [facility] will have bed for pt tomorrow 9/24. CSW faxed vaccination card to [facility]. TOC to follow.   Expected Discharge Plan: [place] Barriers to Discharge: Continued Medical Work up, Ship broker  Expected Discharge Plan and Services Expected Discharge Plan: [place] In-house Referral: Clinical Social Work   Post Acute Care Choice: [place] Living arrangements for the past 2 months: Single Family Home                                       Social Determinants of Health (SDOH) Interventions    Readmission Risk Interventions Readmission Risk Prevention Plan 11/01/2019  Transportation Screening Complete  Medication Review (RN CM) Complete  Some recent data might be hidden

## 2019-11-08 NOTE — Progress Notes (Signed)
CRITICAL VALUE ALERT  Critical Value:  Total bilirubin 19.2  Date & Time Notied:  11/08/19 5329  Provider Notified: Tat  Orders Received/Actions taken: awaiting further orders

## 2019-11-08 NOTE — Progress Notes (Signed)
PROGRESS NOTE  Nicole [name] [id number] DOB: 1950-09-04 DOA: 10/31/2019 PCP: Rosita Fire, MD   Brief History: 69 y.o.female,with history ofrenal cell carcinoma status post right nephrectomy in August 2019thyroid nodule, seizures, schizophrenia, HTN, GERD, and Afib who presents to the ED at the recommendation of her PCP. Patient reports that she made an appt with PCP for chronic leg pain, and she was told to come to the ED for jaundice. Patient reports that she had not noticed any change in color at home. Patient does report that she is an alcoholic. She reports that she drinks too much too often. She was admitted with jaundice and findings of alcoholic hepatitis. Since admission, the patient was started on lactulose for hepatic encephalopathy. GI was consulted to assist with management.  Assessment/Plan: Decompensated alcoholic liver disease/alcoholic hepatitis -Maddrey's discriminant score = 37 -continue prednisone D#6 -decrease prednisone dose to 30 mg; plan 4 more weeks -Dose should be reduced by 10 mg every week until she gets on 10 mg daily at which time it should be to drop to 5 mg daily for 1 week and stop. 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 -finished high dose thiamine 9/25 -mental status continues to improve slolwy -11/05/19 eve--pulled out PICC line -obtain UA >50 WBC -TSH 6.318, Free T4--0.79 -B12--1575 -folate--12.0  UTI--EColi -9/22 UA >50WBC -continue cefdinir   Anemia of Chronic Disease -received 2 units PRBC this admission  AKI -due to volume depletion -serum creatinine peaked 1.80 -improved with IVF and PRBCs -baseline  creatinine 0.9-1.1  Thrombocytopenia -due to Etoh liver disease -daily CBC  Hyponatremia -due to chronic liver disease -monitor BMPs  Essential Hypertension -continue metoprolol  Goals of care -palliative medicine consulted -11/06/19 family meeting-->DNR; MOST form completed -11/07/19--discussed GOC again with son--would consider residential hospice if GI feels prognosis is poor -11/08/19--GOC discussion-->remain DNR-->Okay to go to SNF       Status is: Inpatient  Remains inpatient appropriate because:Altered mental status and IV treatments appropriate due to intensity of illness or inability to take PO   Dispo: The patient is from:Home Anticipated d/c is to:SNF Anticipated d/c date is: 1 days Patient currently is not medically stable to d/c.        Family Communication: son updated on phone 9/24  Consultants:GI, palliative medicine  Code Status: DNR  DVT Prophylaxis: SCDs   Procedures: As Listed in Progress Note Above  Antibiotics: None    Total time spent 35 minutes.  Greater than 50% spent face to face counseling and coordinating care.     Subjective: Patient denies fevers, chills, headache, chest pain, dyspnea, nausea, vomiting, diarrhea, abdominal pain, dysuria, hematuria, hematochezia, and melena.   Objective: Vitals:   11/07/19 1304 11/07/19 2215 11/08/19 0610 11/08/19 1417  BP: 112/78 110/68 111/81 117/76  Pulse: 76 77 71 78  Resp: 16 18 16 18   Temp: 98.3 F (36.8 C) 97.9 F (36.6 C) 98.2 F (36.8 C) 98 F (36.7 C)  TempSrc: Oral Oral Oral   SpO2: 98% 96% 98% 97%  Weight:      Height:        Intake/Output Summary (Last 24 hours) at 11/08/2019 1631 Last data filed at 11/07/2019 1700 Gross per 24 hour  Intake --  Output  450 ml  Net -450 ml   Weight change:  Exam:   General:  Pt is alert, follows commands appropriately, not in acute  distress  HEENT: No icterus, No thrush, No neck mass, [place]/AT  Cardiovascular: RRR, S1/S2, no rubs, no gallops  Respiratory:bibasilar crackles. No wheeze  Abdomen: Soft/+BS, non tender, non distended, no guarding  Extremities: trace LE edema, No lymphangitis, No petechiae, No rashes, no synovitis   Data Reviewed: I have personally reviewed following labs and imaging studies Basic Metabolic Panel: Recent Labs  Lab 11/02/19 0751 11/02/19 0751 11/03/19 0740 11/03/19 0740 11/04/19 1025 11/04/19 8527 11/04/19 1738 11/05/19 0921 11/06/19 0535 11/07/19 0640 11/08/19 0613  NA 127*   < > 128*   < > 132*   < > 131* 133* 133* 135 137  K 3.3*   < > 4.0   < > 3.8   < > 3.5 3.7 3.8 3.6 3.5  CL 96*   < > 98   < > 101   < > 101 103 104 105 107  CO2 19*   < > 16*   < > 19*   < > 20* 18* 16* 18* 18*  GLUCOSE 100*   < > 137*   < > 131*   < > 114* 127* 123* 127* 121*  BUN 33*   < > 35*   < > 36*   < > 40* 43* 47* 47* 46*  CREATININE 1.73*   < > 1.35*   < > 1.16*   < > 1.10* 1.05* 0.99 0.97 0.86  CALCIUM 8.0*   < > 8.4*   < > 8.7*   < > 8.5* 8.7* 9.0 9.1 9.1  MG 2.0  --  2.1  --  2.2  --   --   --   --  2.4  --    < > = values in this interval not displayed.   Liver Function Tests: Recent Labs  Lab 11/04/19 1738 11/05/19 0921 11/06/19 0535 11/07/19 0640 11/08/19 0613  AST 78* 83* 85* 110* 135*  ALT 56* 59* 64* 73* 88*  ALKPHOS 272* 289* 272* 283* 308*  BILITOT 21.0* 22.0* 21.2* 21.2* 19.2*  PROT 5.5* 5.8* 5.4* 5.7* 5.5*  ALBUMIN 2.8* 2.8* 2.8* 2.6* 2.4*   Recent Labs  Lab 11/02/19 0751 11/03/19 0740 11/04/19 0626  LIPASE 122* 78* 61*   Recent Labs  Lab 11/02/19 0751 11/05/19 0921 11/07/19 0640 11/08/19 1015  AMMONIA 71* 59* 52* 26   Coagulation Profile: Recent Labs  Lab 11/02/19 0751 11/03/19 0740 11/04/19 0626 11/05/19 0921 11/07/19 0640  INR 1.7* 1.4* 1.3* 1.2 1.2   CBC: Recent Labs  Lab 11/03/19 0740 11/04/19 0626 11/05/19 0921 11/06/19 0535  11/07/19 0640  WBC 4.3 5.3 9.6 9.4 8.1  NEUTROABS  --   --   --   --  7.2  HGB 7.0* 7.1* 8.6* 8.3* 8.8*  HCT 22.2* 22.5* 26.1* 25.7* 27.5*  MCV 104.2* 97.8 97.0 97.7 96.8  PLT 96* 93* 120* 115* 107*   Cardiac Enzymes: No results for input(s): CKTOTAL, CKMB, CKMBINDEX, TROPONINI in the last 168 hours. BNP: Invalid input(s): POCBNP CBG: No results for input(s): GLUCAP in the last 168 hours. HbA1C: No results for input(s): HGBA1C in the last 72 hours. Urine analysis:    Component Value Date/Time   COLORURINE AMBER (A) 11/06/2019 1450   APPEARANCEUR CLOUDY (A) 11/06/2019 1450   LABSPEC 1.017 11/06/2019 1450   PHURINE 5.0 11/06/2019 1450   GLUCOSEU NEGATIVE  11/06/2019 1450   HGBUR SMALL (A) 11/06/2019 1450   BILIRUBINUR MODERATE (A) 11/06/2019 1450   KETONESUR NEGATIVE 11/06/2019 1450   PROTEINUR NEGATIVE 11/06/2019 1450   NITRITE POSITIVE (A) 11/06/2019 1450   LEUKOCYTESUR MODERATE (A) 11/06/2019 1450   Sepsis Labs: @LABRCNTIP (procalcitonin:4,lacticidven:4) ) Recent Results (from the past 240 hour(s))  SARS Coronavirus 2 by RT PCR (hospital order, performed in [place] hospital lab) Nasopharyngeal Nasopharyngeal Swab     Status: None   Collection Time: 10/31/19 11:20 PM   Specimen: Nasopharyngeal Swab  Result Value Ref Range Status   SARS Coronavirus 2 NEGATIVE NEGATIVE Final    Comment: (NOTE) SARS-CoV-2 target nucleic acids are NOT DETECTED.  The SARS-CoV-2 RNA is generally detectable in upper and lower respiratory specimens during the acute phase of infection. The lowest concentration of SARS-CoV-2 viral copies this assay can detect is 250 copies / mL. A negative result does not preclude SARS-CoV-2 infection and should not be used as the sole basis for treatment or other patient management decisions.  A negative result may occur with improper specimen collection / handling, submission of specimen other than nasopharyngeal swab, presence of viral mutation(s) within  the areas targeted by this assay, and inadequate number of viral copies (<250 copies / mL). A negative result must be combined with clinical observations, patient history, and epidemiological information.  Fact Sheet for Patients:   StrictlyIdeas.no  Fact Sheet for Healthcare Providers: BankingDealers.co.za  This test is not yet approved or  cleared by the [place] FDA and has been authorized for detection and/or diagnosis of SARS-CoV-2 by FDA under an Emergency Use Authorization (EUA).  This EUA will remain in effect (meaning this test can be used) for the duration of the COVID-19 declaration under Section 564(b)(1) of the Act, 21 U.S.C. section 360bbb-3(b)(1), unless the authorization is terminated or revoked sooner.  Performed at [facility], [address] [place], [place] [address]   Culture, blood (routine x 2)     Status: None (Preliminary result)   Collection Time: 11/05/19  3:12 PM   Specimen: BLOOD LEFT HAND  Result Value Ref Range Status   Specimen Description BLOOD LEFT HAND  Final   Special Requests   Final    BOTTLES DRAWN AEROBIC AND ANAEROBIC Blood Culture adequate volume   Culture   Final    NO GROWTH 3 DAYS Performed at [facility], [address] [place], [place] [address]    Report Status PENDING  Incomplete  Culture, blood (routine x 2)     Status: None (Preliminary result)   Collection Time: 11/05/19  3:12 PM   Specimen: BLOOD LEFT HAND  Result Value Ref Range Status   Specimen Description BLOOD LEFT HAND  Final   Special Requests   Final    BOTTLES DRAWN AEROBIC AND ANAEROBIC Blood Culture adequate volume   Culture   Final    NO GROWTH 3 DAYS Performed at [facility], [address], [place], [place] [address]    Report Status PENDING  Incomplete  Culture, Urine     Status: Abnormal   Collection Time: 11/06/19  2:51 PM   Specimen: Urine, Random  Result Value Ref Range Status   Specimen  Description   Final    URINE, RANDOM Performed at [facility], [address], [place], [place] [address]    Special Requests   Final    NONE Performed at [facility], [address] [place], [place] [address]    Culture >=100,000 COLONIES/mL ESCHERICHIA  COLI (A)  Final   Report Status 11/08/2019 FINAL  Final   Organism ID, Bacteria ESCHERICHIA COLI (A)  Final      Susceptibility   Escherichia coli - MIC*    AMPICILLIN <=2 SENSITIVE Sensitive     CEFAZOLIN <=4 SENSITIVE Sensitive     CEFTRIAXONE <=0.25 SENSITIVE Sensitive     CIPROFLOXACIN <=0.25 SENSITIVE Sensitive     GENTAMICIN <=1 SENSITIVE Sensitive     IMIPENEM <=0.25 SENSITIVE Sensitive     NITROFURANTOIN <=16 SENSITIVE Sensitive     TRIMETH/SULFA <=20 SENSITIVE Sensitive     AMPICILLIN/SULBACTAM <=2 SENSITIVE Sensitive     PIP/TAZO <=4 SENSITIVE Sensitive     * >=100,000 COLONIES/mL ESCHERICHIA COLI  Respiratory Panel by RT PCR (Flu A&B, Covid) - Nasopharyngeal Swab     Status: None   Collection Time: 11/08/19 12:25 PM   Specimen: Nasopharyngeal Swab  Result Value Ref Range Status   SARS Coronavirus 2 by RT PCR NEGATIVE NEGATIVE Final    Comment: (NOTE) SARS-CoV-2 target nucleic acids are NOT DETECTED.  The SARS-CoV-2 RNA is generally detectable in upper respiratoy specimens during the acute phase of infection. The lowest concentration of SARS-CoV-2 viral copies this assay can detect is 131 copies/mL. A negative result does not preclude SARS-Cov-2 infection and should not be used as the sole basis for treatment or other patient management decisions. A negative result may occur with  improper specimen collection/handling, submission of specimen other than nasopharyngeal swab, presence of viral mutation(s) within the areas targeted by this assay, and inadequate number of viral copies (<131 copies/mL). A negative result must be combined with clinical observations, patient history, and epidemiological information.  The expected result is Negative.  Fact Sheet for Patients:  PinkCheek.be  Fact Sheet for Healthcare Providers:  GravelBags.it  This test is no t yet approved or cleared by the [place] FDA and  has been authorized for detection and/or diagnosis of SARS-CoV-2 by FDA under an Emergency Use Authorization (EUA). This EUA will remain  in effect (meaning this test can be used) for the duration of the COVID-19 declaration under Section 564(b)(1) of the Act, 21 U.S.C. section 360bbb-3(b)(1), unless the authorization is terminated or revoked sooner.     Influenza A by PCR NEGATIVE NEGATIVE Final   Influenza B by PCR NEGATIVE NEGATIVE Final    Comment: (NOTE) The Xpert Xpress SARS-CoV-2/FLU/RSV assay is intended as an aid in  the diagnosis of influenza from Nasopharyngeal swab specimens and  should not be used as a sole basis for treatment. Nasal washings and  aspirates are unacceptable for Xpert Xpress SARS-CoV-2/FLU/RSV  testing.  Fact Sheet for Patients: PinkCheek.be  Fact Sheet for Healthcare Providers: GravelBags.it  This test is not yet approved or cleared by the [place] FDA and  has been authorized for detection and/or diagnosis of SARS-CoV-2 by  FDA under an Emergency Use Authorization (EUA). This EUA will remain  in effect (meaning this test can be used) for the duration of the  Covid-19 declaration under Section 564(b)(1) of the Act, 21  U.S.C. section 360bbb-3(b)(1), unless the authorization is  terminated or revoked. Performed at [facility], [address], [place], [place] [address]      Scheduled Meds: . sodium chloride   Intravenous Once  . sodium chloride   Intravenous Once  . cefdinir  300 mg Oral Q12H  . Chlorhexidine Gluconate Cloth  6 each Topical Daily  . ferrous sulfate  325 mg Oral Q breakfast  . folic  acid  1 mg Oral Daily  .  lactulose  10 g Oral BID  . metoprolol tartrate  50 mg Oral BID  . multivitamin with minerals  1 tablet Oral Daily  . pantoprazole (PROTONIX) IV  40 mg Intravenous Q24H  . predniSONE  40 mg Oral Daily  . rifaximin  550 mg Oral BID  . sodium chloride flush  10-40 mL Intracatheter Q12H  . thiamine  500 mg Oral Q8H   Continuous Infusions: . sodium chloride 60 mL/hr at 11/05/19 1702    Procedures/Studies: CT ABDOMEN PELVIS WO CONTRAST  Result Date: 10/31/2019 CLINICAL DATA:  Weakness, diarrhea, jaundice, diffuse abdominal pain EXAM: CT ABDOMEN AND PELVIS WITHOUT CONTRAST TECHNIQUE: Multidetector CT imaging of the abdomen and pelvis was performed following the standard protocol without IV contrast. COMPARISON:  12/24/2016, 08/05/2019 FINDINGS: Lower chest: There is a large hiatal hernia containing the majority of the stomach. Trace left pleural effusion with compressive atelectasis in the left lower lobe. The right lung base is clear. Hepatobiliary: There is severe hepatic steatosis again noted, unchanged. No focal liver abnormality. Gallbladder is unremarkable. No biliary dilation. Pancreas: Pancreas is atrophic without focal abnormality. The complex fluid collection between the pancreatic tail and the left lobe liver on prior study has resolved in the interim. Spleen: Normal in size without focal abnormality. Adrenals/Urinary Tract: The right kidney and adrenal gland are surgically absent. Left kidney and adrenal are unremarkable. No urinary tract calculi or obstruction. Bladder is decompressed without focal abnormality. Stomach/Bowel: There is diffuse colonic diverticulosis without diverticulitis. No bowel obstruction or ileus. Normal appendix right lower quadrant. Vascular/Lymphatic: Aortic atherosclerosis. No enlarged abdominal or pelvic lymph nodes. Reproductive: Uterus and bilateral adnexa are unremarkable. Other: Trace simple appearing free fluid in the bilateral upper quadrants and lower pelvis.  No free intraperitoneal gas. No abdominal wall hernia. Musculoskeletal: No acute or destructive bony lesions. Reconstructed images demonstrate no additional findings. IMPRESSION: 1. Interval resolution of the complex fluid collection between the pancreatic tail and left lobe liver. 2. Stable severe hepatic steatosis. 3. Large hiatal hernia. 4. Trace simple appearing free fluid in the bilateral upper quadrants and lower pelvis. 5. Diffuse colonic diverticulosis without diverticulitis. 6. Aortic Atherosclerosis (ICD10-I70.0). Electronically Signed   By: Randa Ngo M.D.   On: 10/31/2019 22:22   US Venous Img Lower Bilateral (DVT)  Result Date: 11/01/2019 CLINICAL DATA:  Lower extremity edema. EXAM: BILATERAL LOWER EXTREMITY VENOUS DOPPLER ULTRASOUND TECHNIQUE: Gray-scale sonography with graded compression, as well as color Doppler and duplex ultrasound were performed to evaluate the lower extremity deep venous systems from the level of the common femoral vein and including the common femoral, femoral, profunda femoral, popliteal and calf veins including the posterior tibial, peroneal and gastrocnemius veins when visible. The superficial great saphenous vein was also interrogated. Spectral Doppler was utilized to evaluate flow at rest and with distal augmentation maneuvers in the common femoral, femoral and popliteal veins. COMPARISON:  None. FINDINGS: RIGHT LOWER EXTREMITY Common Femoral Vein: No evidence of thrombus. Normal compressibility, respiratory phasicity and response to augmentation. Saphenofemoral Junction: No evidence of thrombus. Normal compressibility and flow on color Doppler imaging. Profunda Femoral Vein: No evidence of thrombus. Normal compressibility and flow on color Doppler imaging. Femoral Vein: No evidence of thrombus. Normal compressibility, respiratory phasicity and response to augmentation. Popliteal Vein: No evidence of thrombus. Normal compressibility, respiratory phasicity and  response to augmentation. Calf Veins: No evidence of thrombus. Normal compressibility and flow on color Doppler imaging. Superficial Great Saphenous Vein: No  evidence of thrombus. Normal compressibility. Venous Reflux:  None. Other Findings:  None. LEFT LOWER EXTREMITY Common Femoral Vein: No evidence of thrombus. Normal compressibility, respiratory phasicity and response to augmentation. Saphenofemoral Junction: No evidence of thrombus. Normal compressibility and flow on color Doppler imaging. Profunda Femoral Vein: No evidence of thrombus. Normal compressibility and flow on color Doppler imaging. Femoral Vein: No evidence of thrombus. Normal compressibility, respiratory phasicity and response to augmentation. Popliteal Vein: No evidence of thrombus. Normal compressibility, respiratory phasicity and response to augmentation. Calf Veins: No evidence of thrombus. Normal compressibility and flow on color Doppler imaging. Superficial Great Saphenous Vein: No evidence of thrombus. Normal compressibility. Venous Reflux:  None. Other Findings:  None. IMPRESSION: No evidence of deep venous thrombosis in either lower extremity. Electronically Signed   By: Kerby Moors M.D.   On: 11/01/2019 10:06   DG CHEST PORT 1 VIEW  Result Date: 11/05/2019 CLINICAL DATA:  Patient pulled out PICC line. EXAM: PORTABLE CHEST 1 VIEW COMPARISON:  November 05, 2019 FINDINGS: The right-sided PICC line seen on the prior study has been removed. Mild, stable right basilar atelectasis is seen with a stable area of consolidation noted within the left lung base. A small, stable left pleural effusion is noted. No pneumothorax is identified. The cardiac silhouette is markedly enlarged. There is mild calcification of the aortic arch. Two radiopaque fixation plates and screws are seen overlying the mandibular symphysis. IMPRESSION: 1. Interval right-sided PICC line removal. 2. Stable right basilar atelectasis, with stable left basilar  consolidation and left pleural effusion. Electronically Signed   By: Virgina Norfolk M.D.   On: 11/05/2019 23:35   DG CHEST PORT 1 VIEW  Result Date: 11/05/2019 CLINICAL DATA:  Altered mental status EXAM: PORTABLE CHEST 1 VIEW COMPARISON:  12/25/2016 chest radiograph.  12/20/2016 chest CT. FINDINGS: Right upper extremity PICC tip overlies the distal SVC. Right basilar atelectasis. Left basilar/retrocardiac opacities with small left pleural effusion. No pneumothorax. Multilevel chronic left rib deformities. IMPRESSION: Left basilar consolidation and small left pleural effusion. Right basilar atelectasis. Electronically Signed   By: Primitivo Gauze M.D.   On: 11/05/2019 13:27   DG Shoulder Right Port  Result Date: 11/02/2019 CLINICAL DATA:  Right shoulder pain. EXAM: PORTABLE RIGHT SHOULDER COMPARISON:  None. FINDINGS: There is no evidence of fracture or dislocation. There is no evidence of arthropathy or other focal bone abnormality. A right-sided PICC line is in place. Soft tissues are otherwise unremarkable. IMPRESSION: Negative. Electronically Signed   By: Virgina Norfolk M.D.   On: 11/02/2019 19:30   [place] EKG SITE RITE  Result Date: 11/02/2019 If Site Rite image not attached, placement could not be confirmed due to current cardiac rhythm.  US Abdomen Limited RUQ  Result Date: 11/01/2019 CLINICAL DATA:  Acute liver failure. EXAM: ULTRASOUND ABDOMEN LIMITED RIGHT UPPER QUADRANT COMPARISON:  CT abdomen pelvis, 10/31/2019 FINDINGS: Gallbladder: No gallstones or wall thickening visualized. No sonographic Murphy sign noted by sonographer. Common bile duct: Diameter: 7 mm Liver: Marked increase in liver parenchymal echogenicity which leads to poor through transmission of the sound beam. Liver assessment is somewhat limited due to the poor penetration. No defined liver mass. Portal vein is patent on color Doppler imaging with normal direction of blood flow towards the liver. Other: Small amount of  ascites noted along the inferior liver margin. IMPRESSION: 1. Marked increased liver parenchymal echogenicity consistent with extensive hepatic steatosis, which was also evident on the previous day's CT. 2. No acute findings. Electronically Signed  By: Lajean Manes M.D.   On: 11/01/2019 10:13    Orson Eva, DO  [facility]  If 7PM-7AM, please contact night-coverage [email or web address] Password [email or web address] 11/08/2019, 4:31 PM   LOS: 8 days

## 2019-11-08 NOTE — Progress Notes (Signed)
Subjective:  Patient complains of polyarthralgias.  She is requesting pain medication to be determined by Dr. Shanon Brow Tat..  Biting to nursing staff she has been passing liquid stools.  No melena or rectal bleeding noted.  Patient is trying to eat when she is awake. Patient is complaining about her mittens which were removed moved so that she can eat her breakfast.  Current Medications:  Current Facility-Administered Medications:  .  0.9 %  sodium chloride infusion (Manually program via Guardrails IV Fluids), , Intravenous, Once, Johnson, Clanford L, MD .  0.9 %  sodium chloride infusion (Manually program via Guardrails IV Fluids), , Intravenous, Once, Johnson, Clanford L, MD .  0.9 %  sodium chloride infusion, , Intravenous, Continuous, Johnson, Clanford L, MD, Last Rate: 60 mL/hr at 11/05/19 1702, Rate Change at 11/05/19 1702 .  acetaminophen (TYLENOL) tablet 650 mg, 650 mg, Oral, Q6H PRN, Johnson, Clanford L, MD, 650 mg at 11/08/19 1015 .  cefdinir (OMNICEF) capsule 300 mg, 300 mg, Oral, Q12H, Tat, David, MD, 300 mg at 11/08/19 1010 .  Chlorhexidine Gluconate Cloth 2 % PADS 6 each, 6 each, Topical, Daily, Johnson, Clanford L, MD, 6 each at 11/08/19 1012 .  diphenhydrAMINE (BENADRYL) capsule 25 mg, 25 mg, Oral, Q8H PRN, Lang Snow, FNP, 25 mg at 11/08/19 1015 .  ferrous sulfate tablet 325 mg, 325 mg, Oral, Q breakfast, Johnson, Clanford L, MD, 325 mg at 81/85/63 1497 .  folic acid (FOLVITE) tablet 1 mg, 1 mg, Oral, Daily, Zierle-Ghosh, Asia B, DO, 1 mg at 11/08/19 1011 .  lactulose (CHRONULAC) 10 GM/15ML solution 10 g, 10 g, Oral, BID, Tat, David, MD .  metoprolol tartrate (LOPRESSOR) tablet 50 mg, 50 mg, Oral, BID, Zierle-Ghosh, Asia B, DO, 50 mg at 11/08/19 1010 .  multivitamin with minerals tablet 1 tablet, 1 tablet, Oral, Daily, Zierle-Ghosh, Asia B, DO, 1 tablet at 11/08/19 1011 .  ondansetron (ZOFRAN) tablet 4 mg, 4 mg, Oral, Q6H PRN **OR** ondansetron (ZOFRAN) injection 4 mg, 4 mg,  Intravenous, Q6H PRN, Zierle-Ghosh, Asia B, DO .  pantoprazole (PROTONIX) injection 40 mg, 40 mg, Intravenous, Q24H, Johnson, Clanford L, MD, 40 mg at 11/07/19 1650 .  predniSONE (DELTASONE) tablet 40 mg, 40 mg, Oral, Daily, Rourk, Cristopher Estimable, MD, 40 mg at 11/08/19 1010 .  rifaximin (XIFAXAN) tablet 550 mg, 550 mg, Oral, BID, Montez Morita, Quillian Quince, MD, 550 mg at 11/08/19 1015 .  sodium chloride flush (NS) 0.9 % injection 10-40 mL, 10-40 mL, Intracatheter, Q12H, Johnson, Clanford L, MD, 10 mL at 11/05/19 1341 .  sodium chloride flush (NS) 0.9 % injection 10-40 mL, 10-40 mL, Intracatheter, PRN, Johnson, Clanford L, MD .  thiamine tablet 500 mg, 500 mg, Oral, Q8H, Tat, David, MD, 500 mg at 11/08/19 0263   Objective: Blood pressure 111/81, pulse 71, temperature 98.2 F (36.8 C), temperature source Oral, resp. rate 16, height 5' 8"  (1.727 m), weight 82.7 kg, SpO2 98 %. Patient is alert. She remains deeply jaundiced. She has dressing over right forehead where she had removed the scabs. She responds appropriately to questions. Abdomen is full but soft and nontender. She has ecchymosis over both upper extremities. She does not have any edema.   Labs/studies Results:   CBC Latest Ref Rng & Units 11/07/2019 11/06/2019 11/05/2019  WBC 4.0 - 10.5 K/uL 8.1 9.4 9.6  Hemoglobin 12.0 - 15.0 g/dL 8.8(L) 8.3(L) 8.6(L)  Hematocrit 36 - 46 % 27.5(L) 25.7(L) 26.1(L)  Platelets 150 - 400 K/uL 107(L) 115(L) 120(L)  CMP Latest Ref Rng & Units 11/08/2019 11/07/2019 11/06/2019  Glucose 70 - 99 mg/dL 121(H) 127(H) 123(H)  BUN 8 - 23 mg/dL 46(H) 47(H) 47(H)  Creatinine 0.44 - 1.00 mg/dL 0.86 0.97 0.99  Sodium 135 - 145 mmol/L 137 135 133(L)  Potassium 3.5 - 5.1 mmol/L 3.5 3.6 3.8  Chloride 98 - 111 mmol/L 107 105 104  CO2 22 - 32 mmol/L 18(L) 18(L) 16(L)  Calcium 8.9 - 10.3 mg/dL 9.1 9.1 9.0  Total Protein 6.5 - 8.1 g/dL 5.5(L) 5.7(L) 5.4(L)  Total Bilirubin 0.3 - 1.2 mg/dL 19.2(HH) 21.2(HH) 21.2(HH)   Alkaline Phos 38 - 126 U/L 308(H) 283(H) 272(H)  AST 15 - 41 U/L 135(H) 110(H) 85(H)  ALT 0 - 44 U/L 88(H) 73(H) 64(H)    Hepatic Function Latest Ref Rng & Units 11/08/2019 11/07/2019 11/06/2019  Total Protein 6.5 - 8.1 g/dL 5.5(L) 5.7(L) 5.4(L)  Albumin 3.5 - 5.0 g/dL 2.4(L) 2.6(L) 2.8(L)  AST 15 - 41 U/L 135(H) 110(H) 85(H)  ALT 0 - 44 U/L 88(H) 73(H) 64(H)  Alk Phosphatase 38 - 126 U/L 308(H) 283(H) 272(H)  Total Bilirubin 0.3 - 1.2 mg/dL 19.2(HH) 21.2(HH) 21.2(HH)  Bilirubin, Direct 0.0 - 0.2 mg/dL - - -     Serum ammonia 26.  Serum ammonia was 70 on admission  Assessment:  #1.  Decompensated alcoholic liver disease.  She has alcoholic hepatitis resulting in intrahepatic cholestasis.  She may also have underlying cirrhosis.  Bilirubin was 9 on admission 8 days ago and peaked to 22 three days ago.  Now it is trending down. Hepatic function is definitely improving.  HDL of 1 admission was 37.52 and was 27.18 based on values yesterday. Prednisone will be tapered over the next few weeks.  #2.  Hepatic encephalopathy.  Serum ammonia is down to normal.  She has rectal tube.  She is having liquid stools.  Patient is on lactulose and Xifaxan.  Will back off on lactulose dose.  #3.  Anemia appears to be due to chronic disease.  She did lose significant amount of blood when she pulled off scabs from right forehead.  No evidence of overt GI bleed.  She has received 2 units of PRBCs during this admission.  #4.  E. coli urinary tract infection.  Patient is on cefdinir to which she is sensitive to.  Recommendations  Continue supportive therapy. Continue PPI. Decrease lactulose dose to 10 g p.o. twice daily. Decrease prednisone dose to 30 mg p.o. every morning starting on 11/09/2019. Dose should be reduced by 10 mg every week until she gets on 10 mg daily at which time it should be to drop to 5 mg daily for 1 week and stop.

## 2019-11-08 NOTE — Progress Notes (Signed)
Physical Therapy Treatment Patient Details Name: Nicole [name] MRN: [id number] DOB: [birth year] Today's Date: 11/08/2019    History of Present Illness Nicole [name]  is a 69 y.o. female, with history of thyroid nodule, seizures, schizophrenia, HTN, GERD, and Afib who presents to the ED at the recommendation of her PCP. Patient reports that she made an appt with PCP for chronic leg pain, and she was told to come to the ED for jaundice. Patient reports that she had not noticed any change in color at home. Patient does report that she is an alcoholic. She reports that she drinks too much too often. She quantifies that as several "glasses" or liquor every day. She reports that when she is "coming off of alcohol" she hallucinates. She is not able to report what her hallucinations are. Of note - she had been given ativan in the ED and is very drowsy at the time of my exam. She also reports that she has not been eating at home. This is because she doesn't want to have to "clean up" after a bowel movement.  Patient reports no known history of liver failure or hepatitis. She reports that she lives with her ex husband and her son. She is supposed to ambulate with a walker, but she has not been lately.     PT Comments    Patient requires mod/max assist for rolling in bed today secondary to weakness. Patient requires cueing for mechanics of exercise as well as frequent demonstration for the exercise. Patient educated on continuing supine exercise while in hospital to improve circulation and mobility. Patient apprehensive of completing any other mobility today secondary to fatigue. Patient will benefit from continued physical therapy in hospital and recommended venue below to increase strength, balance, endurance for safe ADLs and gait.   Follow Up Recommendations  SNF     Equipment Recommendations  Other (comment) (ramped entrance for home)    Recommendations for Other Services       Precautions /  Restrictions Precautions Precautions: Fall Precaution Comments: fear w weight bearing Restrictions Weight Bearing Restrictions: No    Mobility  Bed Mobility Overal bed mobility: Needs Assistance Bed Mobility: Rolling Rolling: Mod assist;Max assist         General bed mobility comments: slow, labored rolling, requires use of bed rails and mod/max assist secondary to weakness  Transfers                    Ambulation/Gait                 Stairs             Wheelchair Mobility    Modified Rankin (Stroke Patients Only)       Balance                                            Cognition Arousal/Alertness: Awake/alert Behavior During Therapy: WFL for tasks assessed/performed Overall Cognitive Status: Within Functional Limits for tasks assessed                                        Exercises General Exercises - Lower Extremity Ankle Circles/Pumps: AROM;Both;Supine Quad Sets: AROM;Both;10 reps;Supine Short Arc Quad: AROM;Both;10 reps;Supine Heel Slides: AROM;Both;10 reps;Supine    General Comments  Pertinent Vitals/Pain Pain Assessment: No/denies pain    Home Living                      Prior Function            PT Goals (current goals can now be found in the care plan section) Acute Rehab PT Goals Patient Stated Goal: go home after SNF, get stronger to be able to stand PT Goal Formulation: With patient Time For Goal Achievement: 10/15/20 Potential to Achieve Goals: Good Progress towards PT goals: Progressing toward goals    Frequency    Min 3X/week      PT Plan Current plan remains appropriate    Co-evaluation              AM-PAC PT "6 Clicks" Mobility   Outcome Measure  Help needed turning from your back to your side while in a flat bed without using bedrails?: A Little Help needed moving from lying on your back to sitting on the side of a flat bed without using  bedrails?: A Lot Help needed moving to and from a bed to a chair (including a wheelchair)?: A Lot Help needed standing up from a chair using your arms (e.g., wheelchair or bedside chair)?: A Lot Help needed to walk in hospital room?: A Lot Help needed climbing 3-5 steps with a railing? : A Lot 6 Click Score: 13    End of Session Equipment Utilized During Treatment: Gait belt Activity Tolerance: Patient limited by fatigue Patient left: in bed;with call bell/phone within reach Nurse Communication: Mobility status PT Visit Diagnosis: Unsteadiness on feet (R26.81);Other abnormalities of gait and mobility (R26.89);Repeated falls (R29.6);Muscle weakness (generalized) (M62.81);History of falling (Z91.81)     Time: 2831-5176 PT Time Calculation (min) (ACUTE ONLY): 17 min  Charges:  $Therapeutic Exercise: 8-22 mins                     12:16 PM, 11/08/19 Mearl Latin PT, DPT Physical Therapist at [facility]

## 2019-11-08 NOTE — Care Management Important Message (Signed)
Important Message  Patient Details  Name: Nicole [name] MRN: [id number] Date of Birth: 1950/10/12   Medicare Important Message Given:  Yes     Tommy Medal 11/08/2019, 3:23 PM

## 2019-11-09 DIAGNOSIS — I10 Essential (primary) hypertension: Secondary | ICD-10-CM | POA: Diagnosis not present

## 2019-11-09 DIAGNOSIS — R29898 Other symptoms and signs involving the musculoskeletal system: Secondary | ICD-10-CM | POA: Diagnosis not present

## 2019-11-09 DIAGNOSIS — G9341 Metabolic encephalopathy: Secondary | ICD-10-CM | POA: Diagnosis not present

## 2019-11-09 DIAGNOSIS — R319 Hematuria, unspecified: Secondary | ICD-10-CM | POA: Diagnosis not present

## 2019-11-09 DIAGNOSIS — E7220 Disorder of urea cycle metabolism, unspecified: Secondary | ICD-10-CM | POA: Diagnosis not present

## 2019-11-09 DIAGNOSIS — R2681 Unsteadiness on feet: Secondary | ICD-10-CM | POA: Diagnosis not present

## 2019-11-09 DIAGNOSIS — M24111 Other articular cartilage disorders, right shoulder: Secondary | ICD-10-CM | POA: Diagnosis not present

## 2019-11-09 DIAGNOSIS — E871 Hypo-osmolality and hyponatremia: Secondary | ICD-10-CM | POA: Diagnosis not present

## 2019-11-09 DIAGNOSIS — M6389 Disorders of muscle in diseases classified elsewhere, multiple sites: Secondary | ICD-10-CM | POA: Diagnosis not present

## 2019-11-09 DIAGNOSIS — G4089 Other seizures: Secondary | ICD-10-CM | POA: Diagnosis not present

## 2019-11-09 DIAGNOSIS — F1020 Alcohol dependence, uncomplicated: Secondary | ICD-10-CM | POA: Diagnosis not present

## 2019-11-09 DIAGNOSIS — M6281 Muscle weakness (generalized): Secondary | ICD-10-CM | POA: Diagnosis not present

## 2019-11-09 DIAGNOSIS — Z743 Need for continuous supervision: Secondary | ICD-10-CM | POA: Diagnosis not present

## 2019-11-09 DIAGNOSIS — K7290 Hepatic failure, unspecified without coma: Secondary | ICD-10-CM | POA: Diagnosis not present

## 2019-11-09 DIAGNOSIS — D649 Anemia, unspecified: Secondary | ICD-10-CM | POA: Diagnosis not present

## 2019-11-09 DIAGNOSIS — N390 Urinary tract infection, site not specified: Secondary | ICD-10-CM | POA: Diagnosis not present

## 2019-11-09 DIAGNOSIS — C649 Malignant neoplasm of unspecified kidney, except renal pelvis: Secondary | ICD-10-CM | POA: Diagnosis not present

## 2019-11-09 DIAGNOSIS — N179 Acute kidney failure, unspecified: Secondary | ICD-10-CM | POA: Diagnosis not present

## 2019-11-09 DIAGNOSIS — R41841 Cognitive communication deficit: Secondary | ICD-10-CM | POA: Diagnosis not present

## 2019-11-09 DIAGNOSIS — K7200 Acute and subacute hepatic failure without coma: Secondary | ICD-10-CM | POA: Diagnosis not present

## 2019-11-09 DIAGNOSIS — K7010 Alcoholic hepatitis without ascites: Secondary | ICD-10-CM | POA: Diagnosis not present

## 2019-11-09 DIAGNOSIS — Z7189 Other specified counseling: Secondary | ICD-10-CM | POA: Diagnosis not present

## 2019-11-09 DIAGNOSIS — R7989 Other specified abnormal findings of blood chemistry: Secondary | ICD-10-CM | POA: Diagnosis not present

## 2019-11-09 LAB — COMPREHENSIVE METABOLIC PANEL
ALT: 89 U/L — ABNORMAL HIGH (ref 0–44)
AST: 131 U/L — ABNORMAL HIGH (ref 15–41)
Albumin: 2 g/dL — ABNORMAL LOW (ref 3.5–5.0)
Alkaline Phosphatase: 285 U/L — ABNORMAL HIGH (ref 38–126)
Anion gap: 11 (ref 5–15)
BUN: 40 mg/dL — ABNORMAL HIGH (ref 8–23)
CO2: 19 mmol/L — ABNORMAL LOW (ref 22–32)
Calcium: 8.7 mg/dL — ABNORMAL LOW (ref 8.9–10.3)
Chloride: 103 mmol/L (ref 98–111)
Creatinine, Ser: 0.91 mg/dL (ref 0.44–1.00)
GFR calc Af Amer: >60 mL/min (ref >60)
GFR calc non Af Amer: >60 mL/min (ref >60)
Glucose, Bld: 156 mg/dL — ABNORMAL HIGH (ref 70–99)
Potassium: 3.6 mmol/L (ref 3.5–5.1)
Sodium: 133 mmol/L — ABNORMAL LOW (ref 135–145)
Total Bilirubin: 15.3 mg/dL — ABNORMAL HIGH (ref 0.3–1.2)
Total Protein: 4.8 g/dL — ABNORMAL LOW (ref 6.5–8.1)

## 2019-11-09 LAB — CBC WITH DIFFERENTIAL/PLATELET
Abs Immature Granulocytes: 0.22 10*3/uL — ABNORMAL HIGH (ref 0.00–0.07)
Basophils Absolute: 0 10*3/uL (ref 0.0–0.1)
Basophils Relative: 0 %
Eosinophils Absolute: 0 10*3/uL (ref 0.0–0.5)
Eosinophils Relative: 0 %
HCT: 22.4 % — ABNORMAL LOW (ref 36.0–46.0)
Hemoglobin: 7.2 g/dL — ABNORMAL LOW (ref 12.0–15.0)
Immature Granulocytes: 4 %
Lymphocytes Relative: 3 %
Lymphs Abs: 0.2 10*3/uL — ABNORMAL LOW (ref 0.7–4.0)
MCH: 31.2 pg (ref 26.0–34.0)
MCHC: 32.1 g/dL (ref 30.0–36.0)
MCV: 97 fL (ref 80.0–100.0)
Monocytes Absolute: 0.3 10*3/uL (ref 0.1–1.0)
Monocytes Relative: 5 %
Neutro Abs: 5.2 10*3/uL (ref 1.7–7.7)
Neutrophils Relative %: 88 %
Platelets: 88 10*3/uL — ABNORMAL LOW (ref 150–400)
RBC: 2.31 MIL/uL — ABNORMAL LOW (ref 3.87–5.11)
RDW: 21.2 % — ABNORMAL HIGH (ref 11.5–15.5)
WBC: 5.9 10*3/uL (ref 4.0–10.5)
nRBC: 0.3 % — ABNORMAL HIGH (ref 0.0–0.2)

## 2019-11-09 LAB — AMMONIA: Ammonia: 55 umol/L — ABNORMAL HIGH (ref 9–35)

## 2019-11-09 MED ORDER — FOLIC ACID 1 MG PO TABS: 1.0000 mg | ORAL_TABLET | Freq: Every day | ORAL | Status: DC

## 2019-11-09 MED ORDER — RIFAXIMIN 550 MG PO TABS
550.0000 mg | ORAL_TABLET | Freq: Two times a day (BID) | ORAL | 1 refills | Status: DC
Start: 2019-11-09 — End: 2022-06-28

## 2019-11-09 MED ORDER — THIAMINE HCL 100 MG PO TABS: 100.0000 mg | ORAL_TABLET | Freq: Every day | ORAL | Status: DC

## 2019-11-09 MED ORDER — PREDNISONE 10 MG PO TABS
30.0000 mg | ORAL_TABLET | Freq: Every day | ORAL | 1 refills | Status: DC
Start: 2019-11-09 — End: 2022-05-05

## 2019-11-09 MED ORDER — CEFDINIR 300 MG PO CAPS
300.0000 mg | ORAL_CAPSULE | Freq: Two times a day (BID) | ORAL | 0 refills | Status: DC
Start: 2019-11-09 — End: 2022-05-05

## 2019-11-09 MED ORDER — THIAMINE HCL 100 MG PO TABS
100.0000 mg | ORAL_TABLET | Freq: Every day | ORAL | Status: DC
  Administered 2019-11-09: 100 mg via ORAL

## 2019-11-09 MED ORDER — LACTULOSE 10 GM/15ML PO SOLN: 10.0000 g | Freq: Two times a day (BID) | ORAL | 0 refills | Status: DC

## 2019-11-09 NOTE — Discharge Summary (Signed)
Physician Discharge Summary  Nicole [name] [id number] DOB: Jan 18, 1951 DOA: 10/31/2019  PCP: Rosita Fire, MD  Admit date: 10/31/2019 Discharge date: 11/09/2019  Admitted From: Home Disposition:  SNF  Recommendations for Outpatient Follow-up:  1. Follow up with PCP in 1-2 weeks 2. Please obtain BMP/CBC in one week 3. Please consult palliative services to follow patient      Discharge Condition: Stable CODE STATUS: DNR Diet recommendation: Heart Healthy   Brief/Interim Summary: 69 y.o.female,with history ofrenal cell carcinoma status post right nephrectomy in August 2019thyroid nodule, seizures, schizophrenia, HTN, GERD, and Afib who presents to the ED at the recommendation of her PCP. Patient reports that she made an appt with PCP for chronic leg pain, and she was told to come to the ED for jaundice. Patient reports that she had not noticed any change in color at home. Patient does report that she is an alcoholic. She reports that she drinks too much too often. She was admitted with jaundice and findings of alcoholic hepatitis. Since admission, the patient was started on lactulose for hepatic encephalopathy. GI was consulted to assist with management.   Discharge Diagnoses:  Decompensated alcoholic liver disease/alcoholic hepatitis -Maddrey's discriminant score = 37 -continue prednisone D#7 -decrease prednisone dose to 30 mg; plan 4 more weeks -Dose should be reduced by 10 mg every week until she gets on 10 mg daily at which time it should be to drop to 5 mg daily for 1 week and stop. -appreciate GI help  Hepatic Encephalopathy -continue lactulose--pt refused am 9/23, but agreed after discussion with her -dose increased to 20 g tid>>10 g bid on 9/24 per GI -am ammonia71>>59>>52>>26 -continue rifaximin -mental status much improved--now sitting up eating breakfast and A&O x 3  Acute metabolic Encephalopathy -multifactorial including hyperammonemia and likely  Wernicke/Korsakoff encephalopathy -finished high dose thiamine 9/25 -mental status continues to improve slolwy -11/05/19 eve--pulled out PICC line -obtain UA>50 WBC -TSH 6.318, Free T4--0.79 -B12--1575 -folate--12.0  UTI--EColi -9/22 UA >50WBC -continue cefdinir x 4 more days after d/c  Anemia of Chronic Disease -received 2 units PRBC this admission -Hgb remained stable thereafter--8.8 at time of d/c  AKI -due to volume depletion -serum creatinine peaked 1.80 -improved with IVF and PRBCs -baseline creatinine 0.9-1.1 -serum creatinine 0.91 on day of d/c  Thrombocytopenia -due to Etoh liver disease -daily CBC  Hyponatremia -due to chronic liver disease -monitor BMPs -remained stable/improved  Essential Hypertension -continue metoprolol  Goals of care -palliative medicine consulted -11/06/19 family meeting-->DNR; MOST form completed -11/07/19--discussed GOC again with son--would consider residential hospice if GI feels prognosis is poor -11/08/19--GOC discussion-->remain DNR-->Okay to go to SNF    Discharge Instructions   Allergies as of 11/09/2019      Reactions   Other Shortness Of Breath, Anxiety   UNSPECIFIED REACTION to "fast foods and processed food"   Shellfish Allergy Shortness Of Breath   Iodinated Diagnostic Agents Swelling   Hand swelling from contrast dye for MRI      Medication List    STOP taking these medications   CALCIUM 500 + D PO   HM Potassium 595 (99 K) MG Tabs tablet Generic drug: potassium gluconate     TAKE these medications   cefdinir 300 MG capsule Commonly known as: OMNICEF Take 1 capsule (300 mg total) by mouth every 12 (twelve) hours. X 4 days   ferrous sulfate 325 (65 FE) MG tablet Take 325 mg by mouth daily with breakfast.   folic acid 1 MG tablet Commonly known as:  FOLVITE Take 1 tablet (1 mg total) by mouth daily.   lactulose 10 GM/15ML solution Commonly known as: CHRONULAC Take 15 mLs (10 g total) by  mouth 2 (two) times daily.   Magnesium 250 MG Tabs Take 2 tablets by mouth daily.   metoprolol tartrate 50 MG tablet Commonly known as: LOPRESSOR Take 50 mg by mouth 2 (two) times daily.   multivitamin with minerals tablet Take 1 tablet by mouth daily.   omeprazole 20 MG capsule Commonly known as: PRILOSEC Take 20 mg by mouth daily.   predniSONE 10 MG tablet Commonly known as: DELTASONE Take 3 tablets (30 mg total) by mouth daily. X 7 days; then 2 tabs (20 mg) daily x 7 days; then 1 tab (10 mg) daily x 7 days; then 1/2 tab (5 mg) daily x 7 days   rifaximin 550 MG Tabs tablet Commonly known as: XIFAXAN Take 1 tablet (550 mg total) by mouth 2 (two) times daily.   Systane Complete 0.6 % Soln Generic drug: Propylene Glycol Place 1 drop into both eyes See admin instructions. Instill one drop into both eyes every morning, may also use later in the day as needed for dry eyes   thiamine 100 MG tablet Take 1 tablet (100 mg total) by mouth daily.       Contact information for after-discharge care    [facility] .   Service: Skilled Nursing Contact information: [place] [phone]                 Allergies  Allergen Reactions  . Other Shortness Of Breath and Anxiety    UNSPECIFIED REACTION to "fast foods and processed food"  . Shellfish Allergy Shortness Of Breath  . Iodinated Diagnostic Agents Swelling    Hand swelling from contrast dye for MRI    Consultations:  GI  Palliative medicine   Procedures/Studies: CT ABDOMEN PELVIS WO CONTRAST  Result Date: 10/31/2019 CLINICAL DATA:  Weakness, diarrhea, jaundice, diffuse abdominal pain EXAM: CT ABDOMEN AND PELVIS WITHOUT CONTRAST TECHNIQUE: Multidetector CT imaging of the abdomen and pelvis was performed following the standard protocol without IV contrast. COMPARISON:  12/24/2016, 08/05/2019 FINDINGS: Lower chest: There is a  large hiatal hernia containing the majority of the stomach. Trace left pleural effusion with compressive atelectasis in the left lower lobe. The right lung base is clear. Hepatobiliary: There is severe hepatic steatosis again noted, unchanged. No focal liver abnormality. Gallbladder is unremarkable. No biliary dilation. Pancreas: Pancreas is atrophic without focal abnormality. The complex fluid collection between the pancreatic tail and the left lobe liver on prior study has resolved in the interim. Spleen: Normal in size without focal abnormality. Adrenals/Urinary Tract: The right kidney and adrenal gland are surgically absent. Left kidney and adrenal are unremarkable. No urinary tract calculi or obstruction. Bladder is decompressed without focal abnormality. Stomach/Bowel: There is diffuse colonic diverticulosis without diverticulitis. No bowel obstruction or ileus. Normal appendix right lower quadrant. Vascular/Lymphatic: Aortic atherosclerosis. No enlarged abdominal or pelvic lymph nodes. Reproductive: Uterus and bilateral adnexa are unremarkable. Other: Trace simple appearing free fluid in the bilateral upper quadrants and lower pelvis. No free intraperitoneal gas. No abdominal wall hernia. Musculoskeletal: No acute or destructive bony lesions. Reconstructed images demonstrate no additional findings. IMPRESSION: 1. Interval resolution of the complex fluid collection between the pancreatic tail and left lobe liver. 2. Stable severe hepatic steatosis. 3. Large hiatal hernia. 4. Trace simple appearing free fluid in  the bilateral upper quadrants and lower pelvis. 5. Diffuse colonic diverticulosis without diverticulitis. 6. Aortic Atherosclerosis (ICD10-I70.0). Electronically Signed   By: Randa Ngo M.D.   On: 10/31/2019 22:22   US Venous Img Lower Bilateral (DVT)  Result Date: 11/01/2019 CLINICAL DATA:  Lower extremity edema. EXAM: BILATERAL LOWER EXTREMITY VENOUS DOPPLER ULTRASOUND TECHNIQUE: Gray-scale  sonography with graded compression, as well as color Doppler and duplex ultrasound were performed to evaluate the lower extremity deep venous systems from the level of the common femoral vein and including the common femoral, femoral, profunda femoral, popliteal and calf veins including the posterior tibial, peroneal and gastrocnemius veins when visible. The superficial great saphenous vein was also interrogated. Spectral Doppler was utilized to evaluate flow at rest and with distal augmentation maneuvers in the common femoral, femoral and popliteal veins. COMPARISON:  None. FINDINGS: RIGHT LOWER EXTREMITY Common Femoral Vein: No evidence of thrombus. Normal compressibility, respiratory phasicity and response to augmentation. Saphenofemoral Junction: No evidence of thrombus. Normal compressibility and flow on color Doppler imaging. Profunda Femoral Vein: No evidence of thrombus. Normal compressibility and flow on color Doppler imaging. Femoral Vein: No evidence of thrombus. Normal compressibility, respiratory phasicity and response to augmentation. Popliteal Vein: No evidence of thrombus. Normal compressibility, respiratory phasicity and response to augmentation. Calf Veins: No evidence of thrombus. Normal compressibility and flow on color Doppler imaging. Superficial Great Saphenous Vein: No evidence of thrombus. Normal compressibility. Venous Reflux:  None. Other Findings:  None. LEFT LOWER EXTREMITY Common Femoral Vein: No evidence of thrombus. Normal compressibility, respiratory phasicity and response to augmentation. Saphenofemoral Junction: No evidence of thrombus. Normal compressibility and flow on color Doppler imaging. Profunda Femoral Vein: No evidence of thrombus. Normal compressibility and flow on color Doppler imaging. Femoral Vein: No evidence of thrombus. Normal compressibility, respiratory phasicity and response to augmentation. Popliteal Vein: No evidence of thrombus. Normal compressibility,  respiratory phasicity and response to augmentation. Calf Veins: No evidence of thrombus. Normal compressibility and flow on color Doppler imaging. Superficial Great Saphenous Vein: No evidence of thrombus. Normal compressibility. Venous Reflux:  None. Other Findings:  None. IMPRESSION: No evidence of deep venous thrombosis in either lower extremity. Electronically Signed   By: Kerby Moors M.D.   On: 11/01/2019 10:06   DG CHEST PORT 1 VIEW  Result Date: 11/05/2019 CLINICAL DATA:  Patient pulled out PICC line. EXAM: PORTABLE CHEST 1 VIEW COMPARISON:  November 05, 2019 FINDINGS: The right-sided PICC line seen on the prior study has been removed. Mild, stable right basilar atelectasis is seen with a stable area of consolidation noted within the left lung base. A small, stable left pleural effusion is noted. No pneumothorax is identified. The cardiac silhouette is markedly enlarged. There is mild calcification of the aortic arch. Two radiopaque fixation plates and screws are seen overlying the mandibular symphysis. IMPRESSION: 1. Interval right-sided PICC line removal. 2. Stable right basilar atelectasis, with stable left basilar consolidation and left pleural effusion. Electronically Signed   By: Virgina Norfolk M.D.   On: 11/05/2019 23:35   DG CHEST PORT 1 VIEW  Result Date: 11/05/2019 CLINICAL DATA:  Altered mental status EXAM: PORTABLE CHEST 1 VIEW COMPARISON:  12/25/2016 chest radiograph.  12/20/2016 chest CT. FINDINGS: Right upper extremity PICC tip overlies the distal SVC. Right basilar atelectasis. Left basilar/retrocardiac opacities with small left pleural effusion. No pneumothorax. Multilevel chronic left rib deformities. IMPRESSION: Left basilar consolidation and small left pleural effusion. Right basilar atelectasis. Electronically Signed   By: Milus Mallick.D.  On: 11/05/2019 13:27   DG Shoulder Right Port  Result Date: 11/02/2019 CLINICAL DATA:  Right shoulder pain. EXAM: PORTABLE  RIGHT SHOULDER COMPARISON:  None. FINDINGS: There is no evidence of fracture or dislocation. There is no evidence of arthropathy or other focal bone abnormality. A right-sided PICC line is in place. Soft tissues are otherwise unremarkable. IMPRESSION: Negative. Electronically Signed   By: Virgina Norfolk M.D.   On: 11/02/2019 19:30   [place] EKG SITE RITE  Result Date: 11/02/2019 If Site Rite image not attached, placement could not be confirmed due to current cardiac rhythm.  US Abdomen Limited RUQ  Result Date: 11/01/2019 CLINICAL DATA:  Acute liver failure. EXAM: ULTRASOUND ABDOMEN LIMITED RIGHT UPPER QUADRANT COMPARISON:  CT abdomen pelvis, 10/31/2019 FINDINGS: Gallbladder: No gallstones or wall thickening visualized. No sonographic Murphy sign noted by sonographer. Common bile duct: Diameter: 7 mm Liver: Marked increase in liver parenchymal echogenicity which leads to poor through transmission of the sound beam. Liver assessment is somewhat limited due to the poor penetration. No defined liver mass. Portal vein is patent on color Doppler imaging with normal direction of blood flow towards the liver. Other: Small amount of ascites noted along the inferior liver margin. IMPRESSION: 1. Marked increased liver parenchymal echogenicity consistent with extensive hepatic steatosis, which was also evident on the previous day's CT. 2. No acute findings. Electronically Signed   By: Lajean Manes M.D.   On: 11/01/2019 10:13        Discharge Exam: Vitals:   11/08/19 2016 11/09/19 0517  BP: 118/79 112/71  Pulse: 71 66  Resp: 20 18  Temp: 97.8 F (36.6 C) 97.7 F (36.5 C)  SpO2: 98% 100%   Vitals:   11/08/19 0610 11/08/19 1417 11/08/19 2016 11/09/19 0517  BP: 111/81 117/76 118/79 112/71  Pulse: 71 78 71 66  Resp: 16 18 20 18   Temp: 98.2 F (36.8 C) 98 F (36.7 C) 97.8 F (36.6 C) 97.7 F (36.5 C)  TempSrc: Oral   Oral  SpO2: 98% 97% 98% 100%  Weight:    84.5 kg  Height:        General: Pt  is alert, awake, not in acute distress Cardiovascular: RRR, S1/S2 +, no rubs, no gallops Respiratory: bibasilar crackles. No wheeze Abdominal: Soft, NT, ND, bowel sounds + Extremities: no edema, no cyanosis   The results of significant diagnostics from this hospitalization (including imaging, microbiology, ancillary and laboratory) are listed below for reference.    Significant Diagnostic Studies: CT ABDOMEN PELVIS WO CONTRAST  Result Date: 10/31/2019 CLINICAL DATA:  Weakness, diarrhea, jaundice, diffuse abdominal pain EXAM: CT ABDOMEN AND PELVIS WITHOUT CONTRAST TECHNIQUE: Multidetector CT imaging of the abdomen and pelvis was performed following the standard protocol without IV contrast. COMPARISON:  12/24/2016, 08/05/2019 FINDINGS: Lower chest: There is a large hiatal hernia containing the majority of the stomach. Trace left pleural effusion with compressive atelectasis in the left lower lobe. The right lung base is clear. Hepatobiliary: There is severe hepatic steatosis again noted, unchanged. No focal liver abnormality. Gallbladder is unremarkable. No biliary dilation. Pancreas: Pancreas is atrophic without focal abnormality. The complex fluid collection between the pancreatic tail and the left lobe liver on prior study has resolved in the interim. Spleen: Normal in size without focal abnormality. Adrenals/Urinary Tract: The right kidney and adrenal gland are surgically absent. Left kidney and adrenal are unremarkable. No urinary tract calculi or obstruction. Bladder is decompressed without focal abnormality. Stomach/Bowel: There is diffuse colonic diverticulosis without diverticulitis.  No bowel obstruction or ileus. Normal appendix right lower quadrant. Vascular/Lymphatic: Aortic atherosclerosis. No enlarged abdominal or pelvic lymph nodes. Reproductive: Uterus and bilateral adnexa are unremarkable. Other: Trace simple appearing free fluid in the bilateral upper quadrants and lower pelvis. No free  intraperitoneal gas. No abdominal wall hernia. Musculoskeletal: No acute or destructive bony lesions. Reconstructed images demonstrate no additional findings. IMPRESSION: 1. Interval resolution of the complex fluid collection between the pancreatic tail and left lobe liver. 2. Stable severe hepatic steatosis. 3. Large hiatal hernia. 4. Trace simple appearing free fluid in the bilateral upper quadrants and lower pelvis. 5. Diffuse colonic diverticulosis without diverticulitis. 6. Aortic Atherosclerosis (ICD10-I70.0). Electronically Signed   By: Randa Ngo M.D.   On: 10/31/2019 22:22   US Venous Img Lower Bilateral (DVT)  Result Date: 11/01/2019 CLINICAL DATA:  Lower extremity edema. EXAM: BILATERAL LOWER EXTREMITY VENOUS DOPPLER ULTRASOUND TECHNIQUE: Gray-scale sonography with graded compression, as well as color Doppler and duplex ultrasound were performed to evaluate the lower extremity deep venous systems from the level of the common femoral vein and including the common femoral, femoral, profunda femoral, popliteal and calf veins including the posterior tibial, peroneal and gastrocnemius veins when visible. The superficial great saphenous vein was also interrogated. Spectral Doppler was utilized to evaluate flow at rest and with distal augmentation maneuvers in the common femoral, femoral and popliteal veins. COMPARISON:  None. FINDINGS: RIGHT LOWER EXTREMITY Common Femoral Vein: No evidence of thrombus. Normal compressibility, respiratory phasicity and response to augmentation. Saphenofemoral Junction: No evidence of thrombus. Normal compressibility and flow on color Doppler imaging. Profunda Femoral Vein: No evidence of thrombus. Normal compressibility and flow on color Doppler imaging. Femoral Vein: No evidence of thrombus. Normal compressibility, respiratory phasicity and response to augmentation. Popliteal Vein: No evidence of thrombus. Normal compressibility, respiratory phasicity and response to  augmentation. Calf Veins: No evidence of thrombus. Normal compressibility and flow on color Doppler imaging. Superficial Great Saphenous Vein: No evidence of thrombus. Normal compressibility. Venous Reflux:  None. Other Findings:  None. LEFT LOWER EXTREMITY Common Femoral Vein: No evidence of thrombus. Normal compressibility, respiratory phasicity and response to augmentation. Saphenofemoral Junction: No evidence of thrombus. Normal compressibility and flow on color Doppler imaging. Profunda Femoral Vein: No evidence of thrombus. Normal compressibility and flow on color Doppler imaging. Femoral Vein: No evidence of thrombus. Normal compressibility, respiratory phasicity and response to augmentation. Popliteal Vein: No evidence of thrombus. Normal compressibility, respiratory phasicity and response to augmentation. Calf Veins: No evidence of thrombus. Normal compressibility and flow on color Doppler imaging. Superficial Great Saphenous Vein: No evidence of thrombus. Normal compressibility. Venous Reflux:  None. Other Findings:  None. IMPRESSION: No evidence of deep venous thrombosis in either lower extremity. Electronically Signed   By: Kerby Moors M.D.   On: 11/01/2019 10:06   DG CHEST PORT 1 VIEW  Result Date: 11/05/2019 CLINICAL DATA:  Patient pulled out PICC line. EXAM: PORTABLE CHEST 1 VIEW COMPARISON:  November 05, 2019 FINDINGS: The right-sided PICC line seen on the prior study has been removed. Mild, stable right basilar atelectasis is seen with a stable area of consolidation noted within the left lung base. A small, stable left pleural effusion is noted. No pneumothorax is identified. The cardiac silhouette is markedly enlarged. There is mild calcification of the aortic arch. Two radiopaque fixation plates and screws are seen overlying the mandibular symphysis. IMPRESSION: 1. Interval right-sided PICC line removal. 2. Stable right basilar atelectasis, with stable left basilar consolidation and left  pleural  effusion. Electronically Signed   By: Virgina Norfolk M.D.   On: 11/05/2019 23:35   DG CHEST PORT 1 VIEW  Result Date: 11/05/2019 CLINICAL DATA:  Altered mental status EXAM: PORTABLE CHEST 1 VIEW COMPARISON:  12/25/2016 chest radiograph.  12/20/2016 chest CT. FINDINGS: Right upper extremity PICC tip overlies the distal SVC. Right basilar atelectasis. Left basilar/retrocardiac opacities with small left pleural effusion. No pneumothorax. Multilevel chronic left rib deformities. IMPRESSION: Left basilar consolidation and small left pleural effusion. Right basilar atelectasis. Electronically Signed   By: Primitivo Gauze M.D.   On: 11/05/2019 13:27   DG Shoulder Right Port  Result Date: 11/02/2019 CLINICAL DATA:  Right shoulder pain. EXAM: PORTABLE RIGHT SHOULDER COMPARISON:  None. FINDINGS: There is no evidence of fracture or dislocation. There is no evidence of arthropathy or other focal bone abnormality. A right-sided PICC line is in place. Soft tissues are otherwise unremarkable. IMPRESSION: Negative. Electronically Signed   By: Virgina Norfolk M.D.   On: 11/02/2019 19:30   [place] EKG SITE RITE  Result Date: 11/02/2019 If Site Rite image not attached, placement could not be confirmed due to current cardiac rhythm.  US Abdomen Limited RUQ  Result Date: 11/01/2019 CLINICAL DATA:  Acute liver failure. EXAM: ULTRASOUND ABDOMEN LIMITED RIGHT UPPER QUADRANT COMPARISON:  CT abdomen pelvis, 10/31/2019 FINDINGS: Gallbladder: No gallstones or wall thickening visualized. No sonographic Murphy sign noted by sonographer. Common bile duct: Diameter: 7 mm Liver: Marked increase in liver parenchymal echogenicity which leads to poor through transmission of the sound beam. Liver assessment is somewhat limited due to the poor penetration. No defined liver mass. Portal vein is patent on color Doppler imaging with normal direction of blood flow towards the liver. Other: Small amount of ascites noted along the  inferior liver margin. IMPRESSION: 1. Marked increased liver parenchymal echogenicity consistent with extensive hepatic steatosis, which was also evident on the previous day's CT. 2. No acute findings. Electronically Signed   By: Lajean Manes M.D.   On: 11/01/2019 10:13     Microbiology: Recent Results (from the past 240 hour(s))  SARS Coronavirus 2 by RT PCR (hospital order, performed in [facility] hospital lab) Nasopharyngeal Nasopharyngeal Swab     Status: None   Collection Time: 10/31/19 11:20 PM   Specimen: Nasopharyngeal Swab  Result Value Ref Range Status   SARS Coronavirus 2 NEGATIVE NEGATIVE Final    Comment: (NOTE) SARS-CoV-2 target nucleic acids are NOT DETECTED.  The SARS-CoV-2 RNA is generally detectable in upper and lower respiratory specimens during the acute phase of infection. The lowest concentration of SARS-CoV-2 viral copies this assay can detect is 250 copies / mL. A negative result does not preclude SARS-CoV-2 infection and should not be used as the sole basis for treatment or other patient management decisions.  A negative result may occur with improper specimen collection / handling, submission of specimen other than nasopharyngeal swab, presence of viral mutation(s) within the areas targeted by this assay, and inadequate number of viral copies (<250 copies / mL). A negative result must be combined with clinical observations, patient history, and epidemiological information.  Fact Sheet for Patients:   StrictlyIdeas.no  Fact Sheet for Healthcare Providers: BankingDealers.co.za  This test is not yet approved or  cleared by the [place] FDA and has been authorized for detection and/or diagnosis of SARS-CoV-2 by FDA under an Emergency Use Authorization (EUA).  This EUA will remain in effect (meaning this test can be used) for the duration of the COVID-19 declaration  under Section 564(b)(1) of the Act, 21  U.S.C. section 360bbb-3(b)(1), unless the authorization is terminated or revoked sooner.  Performed at [facility], [address], [place], [place] [address]   Culture, blood (routine x 2)     Status: None (Preliminary result)   Collection Time: 11/05/19  3:12 PM   Specimen: BLOOD LEFT HAND  Result Value Ref Range Status   Specimen Description BLOOD LEFT HAND  Final   Special Requests   Final    BOTTLES DRAWN AEROBIC AND ANAEROBIC Blood Culture adequate volume   Culture   Final    NO GROWTH 4 DAYS Performed at [facility], [address], [place], [place] [address]    Report Status PENDING  Incomplete  Culture, blood (routine x 2)     Status: None (Preliminary result)   Collection Time: 11/05/19  3:12 PM   Specimen: BLOOD LEFT HAND  Result Value Ref Range Status   Specimen Description BLOOD LEFT HAND  Final   Special Requests   Final    BOTTLES DRAWN AEROBIC AND ANAEROBIC Blood Culture adequate volume   Culture   Final    NO GROWTH 4 DAYS Performed at [place] [facility], [address] [place], [place] [address]    Report Status PENDING  Incomplete  Culture, Urine     Status: Abnormal   Collection Time: 11/06/19  2:51 PM   Specimen: Urine, Random  Result Value Ref Range Status   Specimen Description   Final    URINE, RANDOM Performed at [facility], [address] [place], [place] [address]    Special Requests   Final    NONE Performed at [facility], [address], [place], [place] [address]    Culture >=100,000 COLONIES/mL ESCHERICHIA COLI (A)  Final   Report Status 11/08/2019 FINAL  Final   Organism ID, Bacteria ESCHERICHIA COLI (A)  Final      Susceptibility   Escherichia coli - MIC*    AMPICILLIN <=2 SENSITIVE Sensitive     CEFAZOLIN <=4 SENSITIVE Sensitive     CEFTRIAXONE <=0.25 SENSITIVE Sensitive     CIPROFLOXACIN <=0.25 SENSITIVE Sensitive     GENTAMICIN <=1 SENSITIVE Sensitive     IMIPENEM <=0.25 SENSITIVE Sensitive     NITROFURANTOIN <=16 SENSITIVE  Sensitive     TRIMETH/SULFA <=20 SENSITIVE Sensitive     AMPICILLIN/SULBACTAM <=2 SENSITIVE Sensitive     PIP/TAZO <=4 SENSITIVE Sensitive     * >=100,000 COLONIES/mL ESCHERICHIA COLI  Respiratory Panel by RT PCR (Flu A&B, Covid) - Nasopharyngeal Swab     Status: None   Collection Time: 11/08/19 12:25 PM   Specimen: Nasopharyngeal Swab  Result Value Ref Range Status   SARS Coronavirus 2 by RT PCR NEGATIVE NEGATIVE Final    Comment: (NOTE) SARS-CoV-2 target nucleic acids are NOT DETECTED.  The SARS-CoV-2 RNA is generally detectable in upper respiratoy specimens during the acute phase of infection. The lowest concentration of SARS-CoV-2 viral copies this assay can detect is 131 copies/mL. A negative result does not preclude SARS-Cov-2 infection and should not be used as the sole basis for treatment or other patient management decisions. A negative result may occur with  improper specimen collection/handling, submission of specimen other than nasopharyngeal swab, presence of viral mutation(s) within the areas targeted by this assay, and inadequate number of viral copies (<131 copies/mL). A negative result must be combined with clinical observations, patient history, and epidemiological information. The expected result is Negative.  Fact Sheet for Patients:  PinkCheek.be  Fact Sheet for Healthcare Providers:  GravelBags.it  This test is no t yet approved or cleared by the [place] FDA and  has been authorized for detection and/or diagnosis of SARS-CoV-2 by FDA under an Emergency Use Authorization (EUA). This EUA will remain  in effect (meaning this test can be used) for the duration of the COVID-19 declaration under Section 564(b)(1) of the Act, 21 U.S.C. section 360bbb-3(b)(1), unless the authorization is terminated or revoked sooner.     Influenza A by PCR NEGATIVE NEGATIVE Final   Influenza B by PCR NEGATIVE  NEGATIVE Final    Comment: (NOTE) The Xpert Xpress SARS-CoV-2/FLU/RSV assay is intended as an aid in  the diagnosis of influenza from Nasopharyngeal swab specimens and  should not be used as a sole basis for treatment. Nasal washings and  aspirates are unacceptable for Xpert Xpress SARS-CoV-2/FLU/RSV  testing.  Fact Sheet for Patients: PinkCheek.be  Fact Sheet for Healthcare Providers: GravelBags.it  This test is not yet approved or cleared by the [place] FDA and  has been authorized for detection and/or diagnosis of SARS-CoV-2 by  FDA under an Emergency Use Authorization (EUA). This EUA will remain  in effect (meaning this test can be used) for the duration of the  Covid-19 declaration under Section 564(b)(1) of the Act, 21  U.S.C. section 360bbb-3(b)(1), unless the authorization is  terminated or revoked. Performed at [facility], [address] [place], [place] [address]      Labs: Basic Metabolic Panel: Recent Labs  Lab 11/03/19 0740 11/03/19 0740 11/04/19 9833 11/04/19 1738 11/05/19 8250 11/05/19 5397 11/06/19 0535 11/06/19 0535 11/07/19 0640 11/07/19 0640 11/08/19 0613 11/09/19 0712  NA 128*   < > 132*   < > 133*  --  133*  --  135  --  137 133*  K 4.0   < > 3.8   < > 3.7   < > 3.8   < > 3.6   < > 3.5 3.6  CL 98   < > 101   < > 103  --  104  --  105  --  107 103  CO2 16*   < > 19*   < > 18*  --  16*  --  18*  --  18* 19*  GLUCOSE 137*   < > 131*   < > 127*  --  123*  --  127*  --  121* 156*  BUN 35*   < > 36*   < > 43*  --  47*  --  47*  --  46* 40*  CREATININE 1.35*   < > 1.16*   < > 1.05*  --  0.99  --  0.97  --  0.86 0.91  CALCIUM 8.4*   < > 8.7*   < > 8.7*  --  9.0  --  9.1  --  9.1 8.7*  MG 2.1  --  2.2  --   --   --   --   --  2.4  --   --   --    < > = values in this interval not displayed.   Liver Function Tests: Recent Labs  Lab 11/05/19 0921 11/06/19 0535 11/07/19 0640  11/08/19 0613 11/09/19 0712  AST 83* 85* 110* 135* 131*  ALT 59* 64* 73* 88* 89*  ALKPHOS 289* 272* 283* 308* 285*  BILITOT 22.0* 21.2* 21.2* 19.2* 15.3*  PROT 5.8* 5.4* 5.7* 5.5* 4.8*  ALBUMIN 2.8* 2.8* 2.6* 2.4*  2.0*   Recent Labs  Lab 11/03/19 0740 11/04/19 0626  LIPASE 78* 61*   Recent Labs  Lab 11/05/19 0921 11/07/19 0640 11/08/19 1015 11/09/19 0712  AMMONIA 59* 52* 26 55*   CBC: Recent Labs  Lab 11/04/19 0626 11/05/19 0921 11/06/19 0535 11/07/19 0640 11/09/19 0712  WBC 5.3 9.6 9.4 8.1 5.9  NEUTROABS  --   --   --  7.2 5.2  HGB 7.1* 8.6* 8.3* 8.8* 7.2*  HCT 22.5* 26.1* 25.7* 27.5* 22.4*  MCV 97.8 97.0 97.7 96.8 97.0  PLT 93* 120* 115* 107* 88*   Cardiac Enzymes: No results for input(s): CKTOTAL, CKMB, CKMBINDEX, TROPONINI in the last 168 hours. BNP: Invalid input(s): POCBNP CBG: No results for input(s): GLUCAP in the last 168 hours.  Time coordinating discharge:  36 minutes  Signed:  Orson Eva, DO [facility] Pager: [phone] 11/09/2019, 9:17 AM

## 2019-11-09 NOTE — Progress Notes (Signed)
Nsg Discharge Note  Admit Date:  10/31/2019 Discharge date: 11/09/2019   Tharon Aquas to be D/C'd Home per MD order.  AVS completed.  Copy for chart, and copy for patient signed, and dated. Patient/caregiver able to verbalize understanding.  Discharge Medication: Allergies as of 11/09/2019      Reactions   Other Shortness Of Breath, Anxiety   UNSPECIFIED REACTION to "fast foods and processed food"   Shellfish Allergy Shortness Of Breath   Iodinated Diagnostic Agents Swelling   Hand swelling from contrast dye for MRI      Medication List    STOP taking these medications   CALCIUM 500 + D PO   HM Potassium 595 (99 K) MG Tabs tablet Generic drug: potassium gluconate     TAKE these medications   cefdinir 300 MG capsule Commonly known as: OMNICEF Take 1 capsule (300 mg total) by mouth every 12 (twelve) hours. X 4 days   ferrous sulfate 325 (65 FE) MG tablet Take 325 mg by mouth daily with breakfast.   folic acid 1 MG tablet Commonly known as: FOLVITE Take 1 tablet (1 mg total) by mouth daily.   lactulose 10 GM/15ML solution Commonly known as: CHRONULAC Take 15 mLs (10 g total) by mouth 2 (two) times daily.   Magnesium 250 MG Tabs Take 2 tablets by mouth daily.   metoprolol tartrate 50 MG tablet Commonly known as: LOPRESSOR Take 50 mg by mouth 2 (two) times daily.   multivitamin with minerals tablet Take 1 tablet by mouth daily.   omeprazole 20 MG capsule Commonly known as: PRILOSEC Take 20 mg by mouth daily.   predniSONE 10 MG tablet Commonly known as: DELTASONE Take 3 tablets (30 mg total) by mouth daily. X 7 days; then 2 tabs (20 mg) daily x 7 days; then 1 tab (10 mg) daily x 7 days; then 1/2 tab (5 mg) daily x 7 days   rifaximin 550 MG Tabs tablet Commonly known as: XIFAXAN Take 1 tablet (550 mg total) by mouth 2 (two) times daily.   Systane Complete 0.6 % Soln Generic drug: Propylene Glycol Place 1 drop into both eyes See admin instructions. Instill one  drop into both eyes every morning, may also use later in the day as needed for dry eyes   thiamine 100 MG tablet Take 1 tablet (100 mg total) by mouth daily.       Discharge Assessment: Vitals:   11/08/19 2016 11/09/19 0517  BP: 118/79 112/71  Pulse: 71 66  Resp: 20 18  Temp: 97.8 F (36.6 C) 97.7 F (36.5 C)  SpO2: 98% 100%   Skin clean, dry and intact without evidence of skin break down, no evidence of skin tears noted. IV catheter discontinued intact. Site without signs and symptoms of complications - no redness or edema noted at insertion site, patient denies c/o pain - only slight tenderness at site.  Dressing with slight pressure applied.  D/c Instructions-Education: Discharge instructions given to patient/family with verbalized understanding. D/c education completed with patient/family including follow up instructions, medication list, d/c activities limitations if indicated, with other d/c instructions as indicated by MD - patient able to verbalize understanding, all questions fully answered. Patient instructed to return to ED, call 911, or call MD for any changes in condition.  Patient escorted via [place], and D/C home via private auto.  Dorcas Mcmurray, LPN 0/02/7492 4:96 PM

## 2019-11-09 NOTE — Progress Notes (Signed)
Subjective: Patient seen and examined this morning.  She states she is doing okay.  She is complaining that she scratched her face and is bleeding.  Endorses good appetite.  No melena or hematochezia.  Objective: Vital signs in last 24 hours: Temp:  [97.7 F (36.5 C)-98 F (36.7 C)] 97.7 F (36.5 C) (09/25 0517) Pulse Rate:  [66-78] 66 (09/25 0517) Resp:  [18-20] 18 (09/25 0517) BP: (112-118)/(71-79) 112/71 (09/25 0517) SpO2:  [97 %-100 %] 100 % (09/25 0517) Weight:  [84.5 kg] 84.5 kg (09/25 0517) Last BM Date: 11/08/19 General:   Alert and oriented, pleasant Head:  Normocephalic and atraumatic. Eyes:  No icterus, sclera clear. Conjuctiva pink.  Mouth:  Without lesions, mucosa pink and moist.  Neck:  Supple, without thyromegaly or masses.  Heart:  S1, S2 present, no murmurs noted.  Lungs: Clear to auscultation bilaterally, without wheezing, rales, or rhonchi.  Abdomen:  Bowel sounds present, soft, non-tender, non-distended. No HSM or hernias noted. No rebound or guarding. No masses appreciated  Msk:  Symmetrical without gross deformities. Normal posture. Pulses:  Normal pulses noted. Extremities:  Without clubbing or edema. Neurologic:  Alert and  oriented x4;  grossly normal neurologically. Skin:  Warm and dry, intact without significant lesions.  Cervical Nodes:  No significant cervical adenopathy. Psych:  Alert and cooperative. Normal mood and affect.  Intake/Output from previous day: 09/24 0701 - 09/25 0700 In: 480 [P.O.:480] Out: 550 [Urine:550] Intake/Output this shift: Total I/O In: 240 [P.O.:240] Out: -   Lab Results: Recent Labs    11/07/19 0640 11/09/19 0712  WBC 8.1 5.9  HGB 8.8* 7.2*  HCT 27.5* 22.4*  PLT 107* 88*   BMET Recent Labs    11/07/19 0640 11/08/19 0613 11/09/19 0712  NA 135 137 133*  K 3.6 3.5 3.6  CL 105 107 103  CO2 18* 18* 19*  GLUCOSE 127* 121* 156*  BUN 47* 46* 40*  CREATININE 0.97 0.86 0.91  CALCIUM 9.1 9.1 8.7*    LFT Recent Labs    11/07/19 0640 11/08/19 0613 11/09/19 0712  PROT 5.7* 5.5* 4.8*  ALBUMIN 2.6* 2.4* 2.0*  AST 110* 135* 131*  ALT 73* 88* 89*  ALKPHOS 283* 308* 285*  BILITOT 21.2* 19.2* 15.3*   PT/INR Recent Labs    11/07/19 0640  LABPROT 14.3  INR 1.2   Hepatitis Panel No results for input(s): HEPBSAG, HCVAB, HEPAIGM, HEPBIGM in the last 72 hours.   Studies/Results: No results found.  Assessment: *Alcoholic hepatitis in the setting of likely chronic liver disease *PAC encephalopathy-improved *Anemia-likely chronic  Plan: -Patient's alcoholic hepatitis appears to be improving.  She has been placed on prednisone for elevated Madrey's discriminant function.  She will need to continue this for an extended taper.   -Counseled on the vast importance of alcohol cessation -Agree with thiamine -Hepatic encephalopathy improved continue on Xifaxan.  Continue lactulose with goal of 2-3 loose bowel movements daily. -Her anemia appears to be due to chronic disease.  No evidence of GI bleeding.  We will continue to monitor.  Continue on PPI. -GI to continue to follow.  Elon Alas. Abbey Chatters, D.O. Gastroenterology and Hepatology [facility]   LOS: 9 days    11/09/2019, 12:07 PM

## 2019-11-09 NOTE — TOC Transition Note (Signed)
Transition of Care [facility]) - CM/SW Discharge Note   Patient Details  Name: Nicole [name] MRN: [id number] Date of Birth: 29-Jun-1950  [facility] CM/SW Contact:  Natasha Bence, LCSW Phone Number: 11/09/2019, 12:55 PM   Clinical Narrative:    CSW contacted Debbie with [facility] to notify her of patient's discharge Debbie agreeable to to take patient. CSW completed med necessity form and called EMS. Nurse to call report. TOC signing off.   Final next level of care: Skilled Nursing Facility Barriers to Discharge: Barriers Resolved   Patient Goals and CMS Choice Patient states their goals for this hospitalization and ongoing recovery are:: Rehab with SNF CMS Medicare.gov Compare Post Acute Care list provided to:: Patient Choice offered to / list presented to : Patient  Discharge Placement   Existing PASRR number confirmed : 11/09/19            Patient to be transferred to facility by: [facility] Name of family member notified: [name] Patient and family notified of of transfer: 11/09/19  Discharge Plan and Services In-house Referral: Clinical Social Work   Post Acute Care Choice: [place]                               Social Determinants of Health (SDOH) Interventions     Readmission Risk Interventions Readmission Risk Prevention Plan 11/01/2019  Transportation Screening Complete  Medication Review (RN CM) Complete  Some recent data might be hidden

## 2019-11-10 LAB — CULTURE, BLOOD (ROUTINE X 2)
Culture: NO GROWTH
Culture: NO GROWTH
Special Requests: ADEQUATE
Special Requests: ADEQUATE

## 2019-11-11 DIAGNOSIS — D649 Anemia, unspecified: Secondary | ICD-10-CM | POA: Diagnosis not present

## 2019-11-11 DIAGNOSIS — C649 Malignant neoplasm of unspecified kidney, except renal pelvis: Secondary | ICD-10-CM | POA: Diagnosis not present

## 2019-11-11 DIAGNOSIS — K7290 Hepatic failure, unspecified without coma: Secondary | ICD-10-CM | POA: Diagnosis not present

## 2019-11-11 DIAGNOSIS — K7010 Alcoholic hepatitis without ascites: Secondary | ICD-10-CM | POA: Diagnosis not present

## 2019-11-11 DIAGNOSIS — G9341 Metabolic encephalopathy: Secondary | ICD-10-CM | POA: Diagnosis not present

## 2019-11-11 DIAGNOSIS — I10 Essential (primary) hypertension: Secondary | ICD-10-CM | POA: Diagnosis not present

## 2019-11-11 DIAGNOSIS — R319 Hematuria, unspecified: Secondary | ICD-10-CM | POA: Diagnosis not present

## 2019-11-11 DIAGNOSIS — N390 Urinary tract infection, site not specified: Secondary | ICD-10-CM | POA: Diagnosis not present

## 2019-11-19 DIAGNOSIS — K7010 Alcoholic hepatitis without ascites: Secondary | ICD-10-CM | POA: Diagnosis not present

## 2019-12-07 DIAGNOSIS — M6389 Disorders of muscle in diseases classified elsewhere, multiple sites: Secondary | ICD-10-CM | POA: Diagnosis not present

## 2019-12-07 DIAGNOSIS — M24111 Other articular cartilage disorders, right shoulder: Secondary | ICD-10-CM | POA: Diagnosis not present

## 2019-12-07 DIAGNOSIS — R2681 Unsteadiness on feet: Secondary | ICD-10-CM | POA: Diagnosis not present

## 2019-12-07 DIAGNOSIS — G9341 Metabolic encephalopathy: Secondary | ICD-10-CM | POA: Diagnosis not present

## 2019-12-07 DIAGNOSIS — Z9181 History of falling: Secondary | ICD-10-CM | POA: Diagnosis not present

## 2019-12-07 DIAGNOSIS — R41841 Cognitive communication deficit: Secondary | ICD-10-CM | POA: Diagnosis not present

## 2019-12-07 DIAGNOSIS — M6281 Muscle weakness (generalized): Secondary | ICD-10-CM | POA: Diagnosis not present

## 2019-12-09 DIAGNOSIS — G9341 Metabolic encephalopathy: Secondary | ICD-10-CM | POA: Diagnosis not present

## 2019-12-09 DIAGNOSIS — R41841 Cognitive communication deficit: Secondary | ICD-10-CM | POA: Diagnosis not present

## 2019-12-09 DIAGNOSIS — M24111 Other articular cartilage disorders, right shoulder: Secondary | ICD-10-CM | POA: Diagnosis not present

## 2019-12-09 DIAGNOSIS — Z9181 History of falling: Secondary | ICD-10-CM | POA: Diagnosis not present

## 2019-12-09 DIAGNOSIS — M6389 Disorders of muscle in diseases classified elsewhere, multiple sites: Secondary | ICD-10-CM | POA: Diagnosis not present

## 2019-12-09 DIAGNOSIS — R2681 Unsteadiness on feet: Secondary | ICD-10-CM | POA: Diagnosis not present

## 2019-12-09 DIAGNOSIS — M6281 Muscle weakness (generalized): Secondary | ICD-10-CM | POA: Diagnosis not present

## 2019-12-10 DIAGNOSIS — M6389 Disorders of muscle in diseases classified elsewhere, multiple sites: Secondary | ICD-10-CM | POA: Diagnosis not present

## 2019-12-10 DIAGNOSIS — M6281 Muscle weakness (generalized): Secondary | ICD-10-CM | POA: Diagnosis not present

## 2019-12-10 DIAGNOSIS — M24111 Other articular cartilage disorders, right shoulder: Secondary | ICD-10-CM | POA: Diagnosis not present

## 2019-12-10 DIAGNOSIS — G9341 Metabolic encephalopathy: Secondary | ICD-10-CM | POA: Diagnosis not present

## 2019-12-10 DIAGNOSIS — R41841 Cognitive communication deficit: Secondary | ICD-10-CM | POA: Diagnosis not present

## 2019-12-10 DIAGNOSIS — R2681 Unsteadiness on feet: Secondary | ICD-10-CM | POA: Diagnosis not present

## 2019-12-10 DIAGNOSIS — Z9181 History of falling: Secondary | ICD-10-CM | POA: Diagnosis not present

## 2019-12-11 DIAGNOSIS — R41841 Cognitive communication deficit: Secondary | ICD-10-CM | POA: Diagnosis not present

## 2019-12-11 DIAGNOSIS — Z9181 History of falling: Secondary | ICD-10-CM | POA: Diagnosis not present

## 2019-12-11 DIAGNOSIS — R2681 Unsteadiness on feet: Secondary | ICD-10-CM | POA: Diagnosis not present

## 2019-12-11 DIAGNOSIS — M6281 Muscle weakness (generalized): Secondary | ICD-10-CM | POA: Diagnosis not present

## 2019-12-11 DIAGNOSIS — M6389 Disorders of muscle in diseases classified elsewhere, multiple sites: Secondary | ICD-10-CM | POA: Diagnosis not present

## 2019-12-11 DIAGNOSIS — G9341 Metabolic encephalopathy: Secondary | ICD-10-CM | POA: Diagnosis not present

## 2019-12-11 DIAGNOSIS — M24111 Other articular cartilage disorders, right shoulder: Secondary | ICD-10-CM | POA: Diagnosis not present

## 2019-12-12 DIAGNOSIS — Z9181 History of falling: Secondary | ICD-10-CM | POA: Diagnosis not present

## 2019-12-12 DIAGNOSIS — M6281 Muscle weakness (generalized): Secondary | ICD-10-CM | POA: Diagnosis not present

## 2019-12-12 DIAGNOSIS — M6389 Disorders of muscle in diseases classified elsewhere, multiple sites: Secondary | ICD-10-CM | POA: Diagnosis not present

## 2019-12-12 DIAGNOSIS — G9341 Metabolic encephalopathy: Secondary | ICD-10-CM | POA: Diagnosis not present

## 2019-12-12 DIAGNOSIS — M24111 Other articular cartilage disorders, right shoulder: Secondary | ICD-10-CM | POA: Diagnosis not present

## 2019-12-12 DIAGNOSIS — R41841 Cognitive communication deficit: Secondary | ICD-10-CM | POA: Diagnosis not present

## 2019-12-12 DIAGNOSIS — R2681 Unsteadiness on feet: Secondary | ICD-10-CM | POA: Diagnosis not present

## 2019-12-13 DIAGNOSIS — R2681 Unsteadiness on feet: Secondary | ICD-10-CM | POA: Diagnosis not present

## 2019-12-13 DIAGNOSIS — G9341 Metabolic encephalopathy: Secondary | ICD-10-CM | POA: Diagnosis not present

## 2019-12-13 DIAGNOSIS — M6281 Muscle weakness (generalized): Secondary | ICD-10-CM | POA: Diagnosis not present

## 2019-12-13 DIAGNOSIS — M6389 Disorders of muscle in diseases classified elsewhere, multiple sites: Secondary | ICD-10-CM | POA: Diagnosis not present

## 2019-12-13 DIAGNOSIS — M24111 Other articular cartilage disorders, right shoulder: Secondary | ICD-10-CM | POA: Diagnosis not present

## 2019-12-13 DIAGNOSIS — Z9181 History of falling: Secondary | ICD-10-CM | POA: Diagnosis not present

## 2019-12-13 DIAGNOSIS — R41841 Cognitive communication deficit: Secondary | ICD-10-CM | POA: Diagnosis not present

## 2019-12-14 DIAGNOSIS — G9341 Metabolic encephalopathy: Secondary | ICD-10-CM | POA: Diagnosis not present

## 2019-12-14 DIAGNOSIS — R41841 Cognitive communication deficit: Secondary | ICD-10-CM | POA: Diagnosis not present

## 2019-12-14 DIAGNOSIS — M6281 Muscle weakness (generalized): Secondary | ICD-10-CM | POA: Diagnosis not present

## 2019-12-14 DIAGNOSIS — Z9181 History of falling: Secondary | ICD-10-CM | POA: Diagnosis not present

## 2019-12-14 DIAGNOSIS — M6389 Disorders of muscle in diseases classified elsewhere, multiple sites: Secondary | ICD-10-CM | POA: Diagnosis not present

## 2019-12-14 DIAGNOSIS — R2681 Unsteadiness on feet: Secondary | ICD-10-CM | POA: Diagnosis not present

## 2019-12-14 DIAGNOSIS — M24111 Other articular cartilage disorders, right shoulder: Secondary | ICD-10-CM | POA: Diagnosis not present

## 2019-12-16 DIAGNOSIS — M6281 Muscle weakness (generalized): Secondary | ICD-10-CM | POA: Diagnosis not present

## 2019-12-16 DIAGNOSIS — G9341 Metabolic encephalopathy: Secondary | ICD-10-CM | POA: Diagnosis not present

## 2019-12-16 DIAGNOSIS — Z9181 History of falling: Secondary | ICD-10-CM | POA: Diagnosis not present

## 2019-12-16 DIAGNOSIS — R2681 Unsteadiness on feet: Secondary | ICD-10-CM | POA: Diagnosis not present

## 2019-12-16 DIAGNOSIS — M24111 Other articular cartilage disorders, right shoulder: Secondary | ICD-10-CM | POA: Diagnosis not present

## 2019-12-16 DIAGNOSIS — M6389 Disorders of muscle in diseases classified elsewhere, multiple sites: Secondary | ICD-10-CM | POA: Diagnosis not present

## 2019-12-17 DIAGNOSIS — I890 Lymphedema, not elsewhere classified: Secondary | ICD-10-CM | POA: Diagnosis not present

## 2019-12-17 DIAGNOSIS — M24111 Other articular cartilage disorders, right shoulder: Secondary | ICD-10-CM | POA: Diagnosis not present

## 2019-12-17 DIAGNOSIS — Z9181 History of falling: Secondary | ICD-10-CM | POA: Diagnosis not present

## 2019-12-17 DIAGNOSIS — M6389 Disorders of muscle in diseases classified elsewhere, multiple sites: Secondary | ICD-10-CM | POA: Diagnosis not present

## 2019-12-17 DIAGNOSIS — M6281 Muscle weakness (generalized): Secondary | ICD-10-CM | POA: Diagnosis not present

## 2019-12-17 DIAGNOSIS — R2681 Unsteadiness on feet: Secondary | ICD-10-CM | POA: Diagnosis not present

## 2019-12-17 DIAGNOSIS — K7010 Alcoholic hepatitis without ascites: Secondary | ICD-10-CM | POA: Diagnosis not present

## 2019-12-17 DIAGNOSIS — G9341 Metabolic encephalopathy: Secondary | ICD-10-CM | POA: Diagnosis not present

## 2019-12-18 DIAGNOSIS — M6389 Disorders of muscle in diseases classified elsewhere, multiple sites: Secondary | ICD-10-CM | POA: Diagnosis not present

## 2019-12-18 DIAGNOSIS — G9341 Metabolic encephalopathy: Secondary | ICD-10-CM | POA: Diagnosis not present

## 2019-12-18 DIAGNOSIS — R2681 Unsteadiness on feet: Secondary | ICD-10-CM | POA: Diagnosis not present

## 2019-12-18 DIAGNOSIS — M24111 Other articular cartilage disorders, right shoulder: Secondary | ICD-10-CM | POA: Diagnosis not present

## 2019-12-18 DIAGNOSIS — Z9181 History of falling: Secondary | ICD-10-CM | POA: Diagnosis not present

## 2019-12-18 DIAGNOSIS — M6281 Muscle weakness (generalized): Secondary | ICD-10-CM | POA: Diagnosis not present

## 2019-12-19 DIAGNOSIS — D649 Anemia, unspecified: Secondary | ICD-10-CM | POA: Diagnosis not present

## 2019-12-19 DIAGNOSIS — M6281 Muscle weakness (generalized): Secondary | ICD-10-CM | POA: Diagnosis not present

## 2019-12-19 DIAGNOSIS — M24111 Other articular cartilage disorders, right shoulder: Secondary | ICD-10-CM | POA: Diagnosis not present

## 2019-12-19 DIAGNOSIS — R17 Unspecified jaundice: Secondary | ICD-10-CM | POA: Diagnosis not present

## 2019-12-19 DIAGNOSIS — M6389 Disorders of muscle in diseases classified elsewhere, multiple sites: Secondary | ICD-10-CM | POA: Diagnosis not present

## 2019-12-19 DIAGNOSIS — R569 Unspecified convulsions: Secondary | ICD-10-CM | POA: Diagnosis not present

## 2019-12-19 DIAGNOSIS — K219 Gastro-esophageal reflux disease without esophagitis: Secondary | ICD-10-CM | POA: Diagnosis not present

## 2019-12-19 DIAGNOSIS — K7010 Alcoholic hepatitis without ascites: Secondary | ICD-10-CM | POA: Diagnosis not present

## 2019-12-19 DIAGNOSIS — I4891 Unspecified atrial fibrillation: Secondary | ICD-10-CM | POA: Diagnosis not present

## 2019-12-19 DIAGNOSIS — Z9181 History of falling: Secondary | ICD-10-CM | POA: Diagnosis not present

## 2019-12-19 DIAGNOSIS — F209 Schizophrenia, unspecified: Secondary | ICD-10-CM | POA: Diagnosis not present

## 2019-12-19 DIAGNOSIS — I10 Essential (primary) hypertension: Secondary | ICD-10-CM | POA: Diagnosis not present

## 2019-12-19 DIAGNOSIS — R2681 Unsteadiness on feet: Secondary | ICD-10-CM | POA: Diagnosis not present

## 2019-12-19 DIAGNOSIS — G9341 Metabolic encephalopathy: Secondary | ICD-10-CM | POA: Diagnosis not present

## 2019-12-20 DIAGNOSIS — M24111 Other articular cartilage disorders, right shoulder: Secondary | ICD-10-CM | POA: Diagnosis not present

## 2019-12-20 DIAGNOSIS — M6281 Muscle weakness (generalized): Secondary | ICD-10-CM | POA: Diagnosis not present

## 2019-12-20 DIAGNOSIS — G9341 Metabolic encephalopathy: Secondary | ICD-10-CM | POA: Diagnosis not present

## 2019-12-20 DIAGNOSIS — M6389 Disorders of muscle in diseases classified elsewhere, multiple sites: Secondary | ICD-10-CM | POA: Diagnosis not present

## 2019-12-20 DIAGNOSIS — R2681 Unsteadiness on feet: Secondary | ICD-10-CM | POA: Diagnosis not present

## 2019-12-20 DIAGNOSIS — Z9181 History of falling: Secondary | ICD-10-CM | POA: Diagnosis not present

## 2019-12-23 DIAGNOSIS — Z20822 Contact with and (suspected) exposure to covid-19: Secondary | ICD-10-CM | POA: Diagnosis not present

## 2019-12-23 DIAGNOSIS — D638 Anemia in other chronic diseases classified elsewhere: Secondary | ICD-10-CM | POA: Diagnosis not present

## 2019-12-23 DIAGNOSIS — R0689 Other abnormalities of breathing: Secondary | ICD-10-CM | POA: Diagnosis not present

## 2019-12-23 DIAGNOSIS — M6281 Muscle weakness (generalized): Secondary | ICD-10-CM | POA: Diagnosis not present

## 2019-12-23 DIAGNOSIS — I480 Paroxysmal atrial fibrillation: Secondary | ICD-10-CM | POA: Diagnosis not present

## 2019-12-23 DIAGNOSIS — E8809 Other disorders of plasma-protein metabolism, not elsewhere classified: Secondary | ICD-10-CM | POA: Diagnosis not present

## 2019-12-23 DIAGNOSIS — Z9181 History of falling: Secondary | ICD-10-CM | POA: Diagnosis not present

## 2019-12-23 DIAGNOSIS — J90 Pleural effusion, not elsewhere classified: Secondary | ICD-10-CM | POA: Diagnosis not present

## 2019-12-23 DIAGNOSIS — R32 Unspecified urinary incontinence: Secondary | ICD-10-CM | POA: Diagnosis not present

## 2019-12-23 DIAGNOSIS — I340 Nonrheumatic mitral (valve) insufficiency: Secondary | ICD-10-CM | POA: Diagnosis not present

## 2019-12-23 DIAGNOSIS — M6389 Disorders of muscle in diseases classified elsewhere, multiple sites: Secondary | ICD-10-CM | POA: Diagnosis not present

## 2019-12-23 DIAGNOSIS — K7010 Alcoholic hepatitis without ascites: Secondary | ICD-10-CM | POA: Diagnosis not present

## 2019-12-23 DIAGNOSIS — R600 Localized edema: Secondary | ICD-10-CM | POA: Diagnosis not present

## 2019-12-23 DIAGNOSIS — I509 Heart failure, unspecified: Secondary | ICD-10-CM | POA: Diagnosis not present

## 2019-12-23 DIAGNOSIS — R2681 Unsteadiness on feet: Secondary | ICD-10-CM | POA: Diagnosis not present

## 2019-12-23 DIAGNOSIS — R0600 Dyspnea, unspecified: Secondary | ICD-10-CM | POA: Diagnosis not present

## 2019-12-23 DIAGNOSIS — R5381 Other malaise: Secondary | ICD-10-CM | POA: Diagnosis not present

## 2019-12-23 DIAGNOSIS — J811 Chronic pulmonary edema: Secondary | ICD-10-CM | POA: Diagnosis not present

## 2019-12-23 DIAGNOSIS — M24111 Other articular cartilage disorders, right shoulder: Secondary | ICD-10-CM | POA: Diagnosis not present

## 2019-12-23 DIAGNOSIS — K7030 Alcoholic cirrhosis of liver without ascites: Secondary | ICD-10-CM | POA: Diagnosis not present

## 2019-12-23 DIAGNOSIS — R41841 Cognitive communication deficit: Secondary | ICD-10-CM | POA: Diagnosis not present

## 2019-12-23 DIAGNOSIS — R000 Tachycardia, unspecified: Secondary | ICD-10-CM | POA: Diagnosis not present

## 2019-12-23 DIAGNOSIS — J9811 Atelectasis: Secondary | ICD-10-CM | POA: Diagnosis not present

## 2019-12-23 DIAGNOSIS — I110 Hypertensive heart disease with heart failure: Secondary | ICD-10-CM | POA: Diagnosis not present

## 2019-12-23 DIAGNOSIS — R0902 Hypoxemia: Secondary | ICD-10-CM | POA: Diagnosis not present

## 2019-12-23 DIAGNOSIS — I5043 Acute on chronic combined systolic (congestive) and diastolic (congestive) heart failure: Secondary | ICD-10-CM | POA: Diagnosis not present

## 2019-12-23 DIAGNOSIS — I517 Cardiomegaly: Secondary | ICD-10-CM | POA: Diagnosis not present

## 2019-12-23 DIAGNOSIS — R770 Abnormality of albumin: Secondary | ICD-10-CM | POA: Diagnosis not present

## 2019-12-23 DIAGNOSIS — G9341 Metabolic encephalopathy: Secondary | ICD-10-CM | POA: Diagnosis not present

## 2019-12-23 DIAGNOSIS — F0390 Unspecified dementia without behavioral disturbance: Secondary | ICD-10-CM | POA: Diagnosis not present

## 2019-12-23 DIAGNOSIS — K219 Gastro-esophageal reflux disease without esophagitis: Secondary | ICD-10-CM | POA: Diagnosis not present

## 2019-12-23 DIAGNOSIS — I4891 Unspecified atrial fibrillation: Secondary | ICD-10-CM | POA: Diagnosis not present

## 2019-12-23 DIAGNOSIS — E876 Hypokalemia: Secondary | ICD-10-CM | POA: Diagnosis not present

## 2019-12-23 DIAGNOSIS — F1010 Alcohol abuse, uncomplicated: Secondary | ICD-10-CM | POA: Diagnosis not present

## 2019-12-23 DIAGNOSIS — D649 Anemia, unspecified: Secondary | ICD-10-CM | POA: Diagnosis not present

## 2019-12-23 DIAGNOSIS — R0602 Shortness of breath: Secondary | ICD-10-CM | POA: Diagnosis not present

## 2019-12-23 DIAGNOSIS — R601 Generalized edema: Secondary | ICD-10-CM | POA: Diagnosis not present

## 2019-12-23 DIAGNOSIS — M25511 Pain in right shoulder: Secondary | ICD-10-CM | POA: Diagnosis not present

## 2019-12-23 DIAGNOSIS — R609 Edema, unspecified: Secondary | ICD-10-CM | POA: Diagnosis not present

## 2019-12-23 DIAGNOSIS — I5033 Acute on chronic diastolic (congestive) heart failure: Secondary | ICD-10-CM | POA: Diagnosis not present

## 2019-12-24 DIAGNOSIS — F1011 Alcohol abuse, in remission: Secondary | ICD-10-CM | POA: Insufficient documentation

## 2019-12-24 DIAGNOSIS — R601 Generalized edema: Secondary | ICD-10-CM | POA: Insufficient documentation

## 2019-12-24 DIAGNOSIS — I509 Heart failure, unspecified: Secondary | ICD-10-CM | POA: Insufficient documentation

## 2019-12-24 DIAGNOSIS — E8809 Other disorders of plasma-protein metabolism, not elsewhere classified: Secondary | ICD-10-CM | POA: Insufficient documentation

## 2019-12-26 DIAGNOSIS — F0390 Unspecified dementia without behavioral disturbance: Secondary | ICD-10-CM | POA: Diagnosis not present

## 2019-12-26 DIAGNOSIS — R5381 Other malaise: Secondary | ICD-10-CM | POA: Diagnosis not present

## 2019-12-26 DIAGNOSIS — M79604 Pain in right leg: Secondary | ICD-10-CM | POA: Diagnosis not present

## 2019-12-26 DIAGNOSIS — R279 Unspecified lack of coordination: Secondary | ICD-10-CM | POA: Diagnosis not present

## 2019-12-26 DIAGNOSIS — R569 Unspecified convulsions: Secondary | ICD-10-CM | POA: Diagnosis not present

## 2019-12-26 DIAGNOSIS — J45909 Unspecified asthma, uncomplicated: Secondary | ICD-10-CM | POA: Diagnosis not present

## 2019-12-26 DIAGNOSIS — K7290 Hepatic failure, unspecified without coma: Secondary | ICD-10-CM | POA: Diagnosis not present

## 2019-12-26 DIAGNOSIS — Z7901 Long term (current) use of anticoagulants: Secondary | ICD-10-CM | POA: Diagnosis not present

## 2019-12-26 DIAGNOSIS — I10 Essential (primary) hypertension: Secondary | ICD-10-CM | POA: Diagnosis not present

## 2019-12-26 DIAGNOSIS — I509 Heart failure, unspecified: Secondary | ICD-10-CM | POA: Diagnosis not present

## 2019-12-26 DIAGNOSIS — K7010 Alcoholic hepatitis without ascites: Secondary | ICD-10-CM | POA: Diagnosis not present

## 2019-12-26 DIAGNOSIS — I824Y1 Acute embolism and thrombosis of unspecified deep veins of right proximal lower extremity: Secondary | ICD-10-CM | POA: Diagnosis not present

## 2019-12-26 DIAGNOSIS — R0600 Dyspnea, unspecified: Secondary | ICD-10-CM | POA: Diagnosis not present

## 2019-12-26 DIAGNOSIS — K7030 Alcoholic cirrhosis of liver without ascites: Secondary | ICD-10-CM | POA: Diagnosis not present

## 2019-12-26 DIAGNOSIS — R2243 Localized swelling, mass and lump, lower limb, bilateral: Secondary | ICD-10-CM | POA: Diagnosis not present

## 2019-12-26 DIAGNOSIS — R41841 Cognitive communication deficit: Secondary | ICD-10-CM | POA: Diagnosis not present

## 2019-12-26 DIAGNOSIS — K219 Gastro-esophageal reflux disease without esophagitis: Secondary | ICD-10-CM | POA: Diagnosis not present

## 2019-12-26 DIAGNOSIS — M79672 Pain in left foot: Secondary | ICD-10-CM | POA: Diagnosis not present

## 2019-12-26 DIAGNOSIS — R531 Weakness: Secondary | ICD-10-CM | POA: Diagnosis not present

## 2019-12-26 DIAGNOSIS — R0602 Shortness of breath: Secondary | ICD-10-CM | POA: Diagnosis not present

## 2019-12-26 DIAGNOSIS — J90 Pleural effusion, not elsewhere classified: Secondary | ICD-10-CM | POA: Diagnosis not present

## 2019-12-26 DIAGNOSIS — I480 Paroxysmal atrial fibrillation: Secondary | ICD-10-CM | POA: Diagnosis not present

## 2019-12-26 DIAGNOSIS — D649 Anemia, unspecified: Secondary | ICD-10-CM | POA: Diagnosis not present

## 2019-12-26 DIAGNOSIS — R601 Generalized edema: Secondary | ICD-10-CM | POA: Diagnosis not present

## 2019-12-26 DIAGNOSIS — F209 Schizophrenia, unspecified: Secondary | ICD-10-CM | POA: Diagnosis not present

## 2019-12-26 DIAGNOSIS — Z7982 Long term (current) use of aspirin: Secondary | ICD-10-CM | POA: Diagnosis not present

## 2019-12-26 DIAGNOSIS — M7989 Other specified soft tissue disorders: Secondary | ICD-10-CM | POA: Diagnosis not present

## 2019-12-26 DIAGNOSIS — M6389 Disorders of muscle in diseases classified elsewhere, multiple sites: Secondary | ICD-10-CM | POA: Diagnosis not present

## 2019-12-26 DIAGNOSIS — R609 Edema, unspecified: Secondary | ICD-10-CM | POA: Diagnosis not present

## 2019-12-26 DIAGNOSIS — M79605 Pain in left leg: Secondary | ICD-10-CM | POA: Diagnosis not present

## 2019-12-26 DIAGNOSIS — J9811 Atelectasis: Secondary | ICD-10-CM | POA: Diagnosis not present

## 2019-12-26 DIAGNOSIS — I82411 Acute embolism and thrombosis of right femoral vein: Secondary | ICD-10-CM | POA: Diagnosis not present

## 2019-12-26 DIAGNOSIS — I5033 Acute on chronic diastolic (congestive) heart failure: Secondary | ICD-10-CM | POA: Diagnosis not present

## 2019-12-26 DIAGNOSIS — Z85528 Personal history of other malignant neoplasm of kidney: Secondary | ICD-10-CM | POA: Diagnosis not present

## 2019-12-26 DIAGNOSIS — M24111 Other articular cartilage disorders, right shoulder: Secondary | ICD-10-CM | POA: Diagnosis not present

## 2019-12-26 DIAGNOSIS — J811 Chronic pulmonary edema: Secondary | ICD-10-CM | POA: Diagnosis not present

## 2019-12-26 DIAGNOSIS — R2681 Unsteadiness on feet: Secondary | ICD-10-CM | POA: Diagnosis not present

## 2019-12-26 DIAGNOSIS — I110 Hypertensive heart disease with heart failure: Secondary | ICD-10-CM | POA: Diagnosis not present

## 2019-12-26 DIAGNOSIS — Z743 Need for continuous supervision: Secondary | ICD-10-CM | POA: Diagnosis not present

## 2019-12-26 DIAGNOSIS — Z79899 Other long term (current) drug therapy: Secondary | ICD-10-CM | POA: Diagnosis not present

## 2019-12-26 DIAGNOSIS — I4891 Unspecified atrial fibrillation: Secondary | ICD-10-CM | POA: Diagnosis not present

## 2019-12-26 DIAGNOSIS — R32 Unspecified urinary incontinence: Secondary | ICD-10-CM | POA: Diagnosis not present

## 2019-12-26 DIAGNOSIS — M6281 Muscle weakness (generalized): Secondary | ICD-10-CM | POA: Diagnosis not present

## 2019-12-30 DIAGNOSIS — D649 Anemia, unspecified: Secondary | ICD-10-CM | POA: Diagnosis not present

## 2019-12-30 DIAGNOSIS — K7290 Hepatic failure, unspecified without coma: Secondary | ICD-10-CM | POA: Diagnosis not present

## 2019-12-30 DIAGNOSIS — I10 Essential (primary) hypertension: Secondary | ICD-10-CM | POA: Diagnosis not present

## 2019-12-30 DIAGNOSIS — R569 Unspecified convulsions: Secondary | ICD-10-CM | POA: Diagnosis not present

## 2019-12-30 DIAGNOSIS — K219 Gastro-esophageal reflux disease without esophagitis: Secondary | ICD-10-CM | POA: Diagnosis not present

## 2019-12-30 DIAGNOSIS — I509 Heart failure, unspecified: Secondary | ICD-10-CM | POA: Diagnosis not present

## 2019-12-30 DIAGNOSIS — K7010 Alcoholic hepatitis without ascites: Secondary | ICD-10-CM | POA: Diagnosis not present

## 2019-12-30 DIAGNOSIS — F209 Schizophrenia, unspecified: Secondary | ICD-10-CM | POA: Diagnosis not present

## 2019-12-31 DIAGNOSIS — I509 Heart failure, unspecified: Secondary | ICD-10-CM | POA: Diagnosis not present

## 2020-01-06 ENCOUNTER — Other Ambulatory Visit: Payer: Self-pay

## 2020-01-06 ENCOUNTER — Encounter (HOSPITAL_COMMUNITY): Payer: Self-pay | Admitting: Emergency Medicine

## 2020-01-06 ENCOUNTER — Emergency Department (HOSPITAL_COMMUNITY): Payer: Medicare HMO

## 2020-01-06 ENCOUNTER — Emergency Department (HOSPITAL_COMMUNITY)
Admission: EM | Admit: 2020-01-06 | Discharge: 2020-01-06 | Disposition: A | Discharge status: Home / Self Care | Payer: Medicare HMO | Attending: Emergency Medicine | Admitting: Emergency Medicine

## 2020-01-06 DIAGNOSIS — Z743 Need for continuous supervision: Secondary | ICD-10-CM | POA: Diagnosis not present

## 2020-01-06 DIAGNOSIS — Z7901 Long term (current) use of anticoagulants: Secondary | ICD-10-CM | POA: Insufficient documentation

## 2020-01-06 DIAGNOSIS — F209 Schizophrenia, unspecified: Secondary | ICD-10-CM | POA: Diagnosis not present

## 2020-01-06 DIAGNOSIS — R279 Unspecified lack of coordination: Secondary | ICD-10-CM | POA: Diagnosis not present

## 2020-01-06 DIAGNOSIS — I10 Essential (primary) hypertension: Secondary | ICD-10-CM | POA: Diagnosis not present

## 2020-01-06 DIAGNOSIS — K219 Gastro-esophageal reflux disease without esophagitis: Secondary | ICD-10-CM | POA: Diagnosis not present

## 2020-01-06 DIAGNOSIS — Z79899 Other long term (current) drug therapy: Secondary | ICD-10-CM | POA: Diagnosis not present

## 2020-01-06 DIAGNOSIS — M79604 Pain in right leg: Secondary | ICD-10-CM | POA: Diagnosis present

## 2020-01-06 DIAGNOSIS — Z85528 Personal history of other malignant neoplasm of kidney: Secondary | ICD-10-CM | POA: Insufficient documentation

## 2020-01-06 DIAGNOSIS — I4891 Unspecified atrial fibrillation: Secondary | ICD-10-CM | POA: Insufficient documentation

## 2020-01-06 DIAGNOSIS — K7290 Hepatic failure, unspecified without coma: Secondary | ICD-10-CM | POA: Diagnosis not present

## 2020-01-06 DIAGNOSIS — R5381 Other malaise: Secondary | ICD-10-CM | POA: Diagnosis not present

## 2020-01-06 DIAGNOSIS — K7010 Alcoholic hepatitis without ascites: Secondary | ICD-10-CM | POA: Diagnosis not present

## 2020-01-06 DIAGNOSIS — J45909 Unspecified asthma, uncomplicated: Secondary | ICD-10-CM | POA: Diagnosis not present

## 2020-01-06 DIAGNOSIS — Z7982 Long term (current) use of aspirin: Secondary | ICD-10-CM | POA: Diagnosis not present

## 2020-01-06 DIAGNOSIS — I824Y1 Acute embolism and thrombosis of unspecified deep veins of right proximal lower extremity: Secondary | ICD-10-CM

## 2020-01-06 DIAGNOSIS — M7989 Other specified soft tissue disorders: Secondary | ICD-10-CM | POA: Diagnosis not present

## 2020-01-06 DIAGNOSIS — R569 Unspecified convulsions: Secondary | ICD-10-CM | POA: Diagnosis not present

## 2020-01-06 DIAGNOSIS — R531 Weakness: Secondary | ICD-10-CM | POA: Diagnosis not present

## 2020-01-06 DIAGNOSIS — R2243 Localized swelling, mass and lump, lower limb, bilateral: Secondary | ICD-10-CM | POA: Diagnosis not present

## 2020-01-06 DIAGNOSIS — M79672 Pain in left foot: Secondary | ICD-10-CM | POA: Diagnosis not present

## 2020-01-06 DIAGNOSIS — I82411 Acute embolism and thrombosis of right femoral vein: Secondary | ICD-10-CM | POA: Diagnosis not present

## 2020-01-06 DIAGNOSIS — D649 Anemia, unspecified: Secondary | ICD-10-CM | POA: Diagnosis not present

## 2020-01-06 LAB — CBC WITH DIFFERENTIAL/PLATELET
Abs Immature Granulocytes: 0.03 10*3/uL (ref 0.00–0.07)
Basophils Absolute: 0.1 10*3/uL (ref 0.0–0.1)
Basophils Relative: 1 %
Eosinophils Absolute: 0.1 10*3/uL (ref 0.0–0.5)
Eosinophils Relative: 2 %
HCT: 34.8 % — ABNORMAL LOW (ref 36.0–46.0)
Hemoglobin: 10.5 g/dL — ABNORMAL LOW (ref 12.0–15.0)
Immature Granulocytes: 0 %
Lymphocytes Relative: 8 %
Lymphs Abs: 0.7 10*3/uL (ref 0.7–4.0)
MCH: 30.3 pg (ref 26.0–34.0)
MCHC: 30.2 g/dL (ref 30.0–36.0)
MCV: 100.6 fL — ABNORMAL HIGH (ref 80.0–100.0)
Monocytes Absolute: 0.6 10*3/uL (ref 0.1–1.0)
Monocytes Relative: 7 %
Neutro Abs: 7.2 10*3/uL (ref 1.7–7.7)
Neutrophils Relative %: 82 %
Platelets: 154 10*3/uL (ref 150–400)
RBC: 3.46 MIL/uL — ABNORMAL LOW (ref 3.87–5.11)
RDW: 13.9 % (ref 11.5–15.5)
WBC: 8.8 10*3/uL (ref 4.0–10.5)
nRBC: 0 % (ref 0.0–0.2)

## 2020-01-06 LAB — COMPREHENSIVE METABOLIC PANEL
ALT: 14 U/L (ref 0–44)
AST: 19 U/L (ref 15–41)
Albumin: 2.2 g/dL — ABNORMAL LOW (ref 3.5–5.0)
Alkaline Phosphatase: 126 U/L (ref 38–126)
Anion gap: 6 (ref 5–15)
BUN: 15 mg/dL (ref 8–23)
CO2: 30 mmol/L (ref 22–32)
Calcium: 8.2 mg/dL — ABNORMAL LOW (ref 8.9–10.3)
Chloride: 98 mmol/L (ref 98–111)
Creatinine, Ser: 1.05 mg/dL — ABNORMAL HIGH (ref 0.44–1.00)
GFR, Estimated: 57 mL/min — ABNORMAL LOW (ref >60)
Glucose, Bld: 146 mg/dL — ABNORMAL HIGH (ref 70–99)
Potassium: 4.4 mmol/L (ref 3.5–5.1)
Sodium: 134 mmol/L — ABNORMAL LOW (ref 135–145)
Total Bilirubin: 1 mg/dL (ref 0.3–1.2)
Total Protein: 5.2 g/dL — ABNORMAL LOW (ref 6.5–8.1)

## 2020-01-06 LAB — PROTIME-INR
INR: 1.1 (ref 0.8–1.2)
Prothrombin Time: 13.3 seconds (ref 11.4–15.2)

## 2020-01-06 LAB — D-DIMER, QUANTITATIVE: D-Dimer, Quant: 1.62 ug/mL-FEU — ABNORMAL HIGH (ref 0.00–0.50)

## 2020-01-06 MED ORDER — RIVAROXABAN (XARELTO) VTE STARTER PACK (15 & 20 MG): ORAL_TABLET | ORAL | 0 refills | Status: DC

## 2020-01-06 MED ORDER — RIVAROXABAN 15 MG PO TABS
15.0000 mg | ORAL_TABLET | Freq: Once | ORAL | Status: AC
  Administered 2020-01-06: 15 mg via ORAL
  Filled 2020-01-06: qty 1

## 2020-01-06 NOTE — ED Triage Notes (Signed)
Pt sent from [facility] due to possible blood clots in legs

## 2020-01-06 NOTE — Discharge Instructions (Signed)
Return if any problems.

## 2020-01-06 NOTE — ED Provider Notes (Signed)
[facility] EMERGENCY DEPARTMENT Provider Note   CSN: [id number] Arrival date & time: 01/06/20  1101     History Chief Complaint  Patient presents with  . possible blood clots    Nicole [name] is a 69 y.o. female.  The history is provided by the patient. No language interpreter was used.  Leg Pain Location:  Leg Leg location:  L leg and R leg Pain details:    Quality:  Aching   Radiates to:  Does not radiate   Severity:  Moderate   Onset quality:  Gradual   Timing:  Constant   Progression:  Worsening Chronicity:  New Dislocation: no   Tetanus status:  Out of date Relieved by:  Nothing Worsened by:  Nothing Ineffective treatments:  None tried Risk factors: no concern for non-accidental trauma   Pt complains of leg pain and swelling.      Past Medical History:  Diagnosis Date  . Anemia   . Arthritis   . Asthma    as a child  . Atrial fibrillation with RVR ([place])   . Cancer ([place])    right kidney  . Chronic alcoholism ([place])   . Chronic left shoulder pain   . Dysrhythmia    Atrial Fib with RVR   . GERD (gastroesophageal reflux disease)   . Heartburn   . Hiatal hernia 12/17/2016   stomach noted in chest on CT scan  . History of blood transfusion   . Hypertension   . Memory loss   . Schizophrenia ([place])   . Seizures ([place])     had when she had period and when she ovalate  . Thyroid nodule     Patient Active Problem List   Diagnosis Date Noted  . Increased ammonia level   . Palliative care by specialist   . Goals of care, counseling/discussion   . AKI (acute kidney injury) ([place])   . Hepatic encephalopathy ([place]) 11/01/2019  . Hypotension 11/01/2019  . Jaundice 11/01/2019  . Hyperbilirubinemia 11/01/2019  . Hyperammonemia ([place]) 11/01/2019  . Alcoholic hepatitis 81/27/5170  . Acute liver failure 10/31/2019  . Acute respiratory failure with hypoxia ([place])   . Epigastric abdominal tenderness 12/18/2016  . Gastric mass 12/17/2016    Class: Acute  . Right  renal mass 12/17/2016    Class: Acute  . Diverticulosis of sigmoid colon 12/17/2016  . Hiatal hernia 12/17/2016    Class: Acute  . Chronic alcoholism ([place]) 12/17/2016    Class: Chronic  . Chronic left shoulder pain 12/17/2016    Class: Chronic  . Arthritis 12/17/2016    Class: Chronic  . Hyponatremia 12/17/2016    Class: Acute  . Hypokalemia 12/17/2016    Class: Acute  . Anemia 12/17/2016  . Atrial fibrillation with rapid ventricular response ([place]) 12/17/2016    Class: Acute    Past Surgical History:  Procedure Laterality Date  . ANKLE FRACTURE SURGERY Right 2008   screws  . ANKLE FRACTURE SURGERY Left 2008   plates  . ESOPHAGOGASTRODUODENOSCOPY (EGD) WITH PROPOFOL N/A 12/18/2016   Procedure: ESOPHAGOGASTRODUODENOSCOPY (EGD) WITH PROPOFOL;  Surgeon: Wilford Corner, MD;  Location: [place];  Service: Endoscopy;  Laterality: N/A;  . NASAL SINUS SURGERY Left 11/17/2017   Procedure: ENDOSCOPIC SINUS approach;  Surgeon: Melida Quitter, MD;  Location: [place];  Service: ENT;  Laterality: Left;  . NEPHRECTOMY Right    robotic riight radical   . ORIF ORBITAL FRACTURE Left 11/17/2017   Procedure: Trans Antral Left sided orbital floor fracture repair  with dissovable floor implant;  Surgeon: Melida Quitter, MD;  Location: [place];  Service: ENT;  Laterality: Left;     OB History   No obstetric history on file.     Family History  Problem Relation Age of Onset  . Stroke Mother   . Heart attack Father   . Ulcers Father   . Diabetes Sister   . Cancer Brother   . Diabetes Sister   . Diabetes Sister     Social History   Tobacco Use  . Smoking status: Former Smoker    Years: 15.00    Types: Cigarettes    Quit date: 11/14/2007    Years since quitting: 12.1  . Smokeless tobacco: Never Used  Vaping Use  . Vaping Use: Never used  Substance Use Topics  . Alcohol use: Not Currently    Comment: none since fall 11/13/17  . Drug use: No    Home Medications Prior to Admission  medications   Medication Sig Start Date End Date Taking? Authorizing Provider  aspirin 81 MG EC tablet Take by mouth. 12/26/19 01/25/20 Yes [provider]  diclofenac Sodium (VOLTAREN) 1 % GEL  12/17/19  Yes [provider]  digoxin (LANOXIN) 0.125 MG tablet Take by mouth. 12/27/19 12/26/20 Yes [provider]  furosemide (LASIX) 20 MG tablet Take by mouth. 12/26/19 01/25/20 Yes [provider]  potassium chloride SA (KLOR-CON) 20 MEQ tablet Take by mouth. 12/26/19 01/10/20 Yes [provider]  cefdinir (OMNICEF) 300 MG capsule Take 1 capsule (300 mg total) by mouth every 12 (twelve) hours. X 4 days 11/09/19   Orson Eva, MD  ferrous sulfate 325 (65 FE) MG tablet Take 325 mg by mouth daily with breakfast.     [provider]  folic acid (FOLVITE) 1 MG tablet Take 1 tablet (1 mg total) by mouth daily. 11/09/19   Orson Eva, MD  lactulose (CHRONULAC) 10 GM/15ML solution Take 15 mLs (10 g total) by mouth 2 (two) times daily. 11/09/19   Orson Eva, MD  Magnesium 250 MG TABS Take 2 tablets by mouth daily.    [provider]  metoprolol tartrate (LOPRESSOR) 50 MG tablet Take 50 mg by mouth 2 (two) times daily.    [provider]  Multiple Vitamins-Minerals (MULTIVITAMIN WITH MINERALS) tablet Take 1 tablet by mouth daily.    [provider]  omeprazole (PRILOSEC) 20 MG capsule Take 20 mg by mouth daily.    [provider]  predniSONE (DELTASONE) 10 MG tablet Take 3 tablets (30 mg total) by mouth daily. X 7 days; then 2 tabs (20 mg) daily x 7 days; then 1 tab (10 mg) daily x 7 days; then 1/2 tab (5 mg) daily x 7 days 11/09/19   Tat, Shanon Brow, MD  Propylene Glycol (SYSTANE COMPLETE) 0.6 % SOLN Place 1 drop into both eyes See admin instructions. Instill one drop into both eyes every morning, may also use later in the day as needed for dry eyes    [provider]  rifaximin (XIFAXAN) 550 MG TABS tablet Take 1 tablet  (550 mg total) by mouth 2 (two) times daily. 11/09/19   Orson Eva, MD  RIVAROXABAN [employer]) VTE STARTER PACK (15 & 20 MG) Follow package directions: Take one 15mg  tablet by mouth twice a day. On day 22, switch to one 20mg  tablet once a day. Take with food. 01/06/20   Fransico Meadow, PA-C  thiamine 100 MG tablet Take 1 tablet (100 mg total) by  mouth daily. 11/09/19   Orson Eva, MD    Allergies    Other, Shellfish allergy, and Iodinated diagnostic agents  Review of Systems   Review of Systems  All other systems reviewed and are negative.   Physical Exam Updated Vital Signs BP 119/74   Pulse (!) 107   Temp 99.3 F (37.4 C) (Oral)   Resp (!) 22   Wt 84.4 kg   SpO2 96%   BMI 28.28 kg/m   Physical Exam Vitals and nursing note reviewed.  Constitutional:      Appearance: She is well-developed.  HENT:     Head: Normocephalic.  Cardiovascular:     Rate and Rhythm: Normal rate.  Pulmonary:     Effort: Pulmonary effort is normal.  Abdominal:     General: There is no distension.  Musculoskeletal:        General: Tenderness present. Normal range of motion.     Cervical back: Normal range of motion.  Skin:    General: Skin is warm.  Neurological:     General: No focal deficit present.     Mental Status: She is alert and oriented to person, place, and time.  Psychiatric:        Mood and Affect: Mood normal.     ED Results / Procedures / Treatments   Labs (all labs ordered are listed, but only abnormal results are displayed) Labs Reviewed  CBC WITH DIFFERENTIAL/PLATELET - Abnormal; Notable for the following components:      Result Value   RBC 3.46 (*)    Hemoglobin 10.5 (*)    HCT 34.8 (*)    MCV 100.6 (*)    All other components within normal limits  COMPREHENSIVE METABOLIC PANEL - Abnormal; Notable for the following components:   Sodium 134 (*)    Glucose, Bld 146 (*)    Creatinine, Ser 1.05 (*)    Calcium 8.2 (*)    Total Protein 5.2 (*)    Albumin 2.2 (*)     GFR, Estimated 57 (*)    All other components within normal limits  D-DIMER, QUANTITATIVE (NOT AT [facility]) - Abnormal; Notable for the following components:   D-Dimer, Quant 1.62 (*)    All other components within normal limits  PROTIME-INR    EKG None  Radiology US Venous Img Lower Bilateral (DVT)  Result Date: 01/06/2020 CLINICAL DATA:  69 year old female with history of bilateral lower extremity swelling. EXAM: BILATERAL LOWER EXTREMITY VENOUS DOPPLER ULTRASOUND TECHNIQUE: Gray-scale sonography with graded compression, as well as color Doppler and duplex ultrasound were performed to evaluate the lower extremity deep venous systems from the level of the common femoral vein and including the common femoral, femoral, profunda femoral, popliteal and calf veins including the posterior tibial, peroneal and gastrocnemius veins when visible. The superficial great saphenous vein was also interrogated. Spectral Doppler was utilized to evaluate flow at rest and with distal augmentation maneuvers in the common femoral, femoral and popliteal veins. COMPARISON:  11/01/2019 FINDINGS: RIGHT LOWER EXTREMITY Common Femoral Vein: No evidence of thrombus. Normal compressibility, respiratory phasicity and response to augmentation. Saphenofemoral Junction: No evidence of thrombus. Normal compressibility and flow on color Doppler imaging. Profunda Femoral Vein: Focal, central nonocclusive thrombus. Otherwise patent. Femoral Vein: No evidence of thrombus. Normal compressibility, respiratory phasicity and response to augmentation. Popliteal Vein: No evidence of thrombus. Normal compressibility, respiratory phasicity and response to augmentation. Calf Veins: No evidence of thrombus. Normal compressibility and flow on color Doppler imaging. Superficial Great Saphenous Vein:  No evidence of thrombus. Normal compressibility. Other Findings:  None. LEFT LOWER EXTREMITY Common Femoral Vein: No evidence of thrombus. Normal  compressibility, respiratory phasicity and response to augmentation. Saphenofemoral Junction: No evidence of thrombus. Normal compressibility and flow on color Doppler imaging. Profunda Femoral Vein: No evidence of thrombus. Normal compressibility and flow on color Doppler imaging. Femoral Vein: No evidence of thrombus. Normal compressibility, respiratory phasicity and response to augmentation. Popliteal Vein: No evidence of thrombus. Normal compressibility, respiratory phasicity and response to augmentation. Calf Veins: No evidence of thrombus. Normal compressibility and flow on color Doppler imaging. Superficial Great Saphenous Vein: No evidence of thrombus. Normal compressibility. Other Findings:  None. IMPRESSION: 1. Focal, nonocclusive thrombus within the central aspect of the right profunda femoral vein. 2. No additional evidence of bilateral lower extremity deep vein thrombosis. These results will be called to the ordering clinician or representative by the Radiologist Assistant, and communication documented in the PACS or [employer]. Ruthann Cancer, MD [facility] Radiology Electronically Signed   By: Ruthann Cancer MD   On: 01/06/2020 14:29    Procedures Procedures (including critical care time)  Medications Ordered in ED Medications  Rivaroxaban (XARELTO) tablet 15 mg (has no administration in time range)    ED Course  I have reviewed the triage vital signs and the nursing notes.  Pertinent labs & imaging results that were available during my care of the patient were reviewed by me and considered in my medical decision making (see chart for details).    MDM Rules/Calculators/A&P                         MDM:  DDimer is positive  Ultrasound shows thrombus of right profunda dvt.  Final Clinical Impression(s) / ED Diagnoses Final diagnoses:  Swelling of both lower extremities  Acute deep vein thrombosis (DVT) of proximal vein of right  lower extremity ([place])    Rx / DC Orders ED Discharge Orders         Ordered    RIVAROXABAN (XARELTO) VTE STARTER PACK (15 & 20 MG)        01/06/20 1519           Fransico Meadow, [place] 01/06/20 1556    Pattricia Boss, MD 01/07/20 1159

## 2020-01-10 DIAGNOSIS — M79604 Pain in right leg: Secondary | ICD-10-CM | POA: Diagnosis not present

## 2020-01-10 DIAGNOSIS — M79605 Pain in left leg: Secondary | ICD-10-CM | POA: Diagnosis not present

## 2020-01-13 DIAGNOSIS — I509 Heart failure, unspecified: Secondary | ICD-10-CM | POA: Diagnosis not present

## 2020-01-13 DIAGNOSIS — R0600 Dyspnea, unspecified: Secondary | ICD-10-CM | POA: Diagnosis not present

## 2020-01-13 DIAGNOSIS — M6389 Disorders of muscle in diseases classified elsewhere, multiple sites: Secondary | ICD-10-CM | POA: Diagnosis not present

## 2020-01-13 DIAGNOSIS — M6281 Muscle weakness (generalized): Secondary | ICD-10-CM | POA: Diagnosis not present

## 2020-01-14 DIAGNOSIS — M6281 Muscle weakness (generalized): Secondary | ICD-10-CM | POA: Diagnosis not present

## 2020-01-14 DIAGNOSIS — R0600 Dyspnea, unspecified: Secondary | ICD-10-CM | POA: Diagnosis not present

## 2020-01-14 DIAGNOSIS — M6389 Disorders of muscle in diseases classified elsewhere, multiple sites: Secondary | ICD-10-CM | POA: Diagnosis not present

## 2020-01-14 DIAGNOSIS — I509 Heart failure, unspecified: Secondary | ICD-10-CM | POA: Diagnosis not present

## 2020-01-15 DIAGNOSIS — R0600 Dyspnea, unspecified: Secondary | ICD-10-CM | POA: Diagnosis not present

## 2020-01-15 DIAGNOSIS — I509 Heart failure, unspecified: Secondary | ICD-10-CM | POA: Diagnosis not present

## 2020-01-15 DIAGNOSIS — M6281 Muscle weakness (generalized): Secondary | ICD-10-CM | POA: Diagnosis not present

## 2020-01-15 DIAGNOSIS — M6389 Disorders of muscle in diseases classified elsewhere, multiple sites: Secondary | ICD-10-CM | POA: Diagnosis not present

## 2020-01-16 DIAGNOSIS — M6281 Muscle weakness (generalized): Secondary | ICD-10-CM | POA: Diagnosis not present

## 2020-01-16 DIAGNOSIS — M6389 Disorders of muscle in diseases classified elsewhere, multiple sites: Secondary | ICD-10-CM | POA: Diagnosis not present

## 2020-01-16 DIAGNOSIS — R0600 Dyspnea, unspecified: Secondary | ICD-10-CM | POA: Diagnosis not present

## 2020-01-16 DIAGNOSIS — I509 Heart failure, unspecified: Secondary | ICD-10-CM | POA: Diagnosis not present

## 2020-01-17 DIAGNOSIS — M6389 Disorders of muscle in diseases classified elsewhere, multiple sites: Secondary | ICD-10-CM | POA: Diagnosis not present

## 2020-01-17 DIAGNOSIS — M19011 Primary osteoarthritis, right shoulder: Secondary | ICD-10-CM | POA: Diagnosis not present

## 2020-01-17 DIAGNOSIS — I509 Heart failure, unspecified: Secondary | ICD-10-CM | POA: Diagnosis not present

## 2020-01-17 DIAGNOSIS — R0600 Dyspnea, unspecified: Secondary | ICD-10-CM | POA: Diagnosis not present

## 2020-01-17 DIAGNOSIS — M6281 Muscle weakness (generalized): Secondary | ICD-10-CM | POA: Diagnosis not present

## 2020-01-20 DIAGNOSIS — I509 Heart failure, unspecified: Secondary | ICD-10-CM | POA: Diagnosis not present

## 2020-01-20 DIAGNOSIS — M6281 Muscle weakness (generalized): Secondary | ICD-10-CM | POA: Diagnosis not present

## 2020-01-20 DIAGNOSIS — R0600 Dyspnea, unspecified: Secondary | ICD-10-CM | POA: Diagnosis not present

## 2020-01-20 DIAGNOSIS — M6389 Disorders of muscle in diseases classified elsewhere, multiple sites: Secondary | ICD-10-CM | POA: Diagnosis not present

## 2020-01-21 DIAGNOSIS — I509 Heart failure, unspecified: Secondary | ICD-10-CM | POA: Diagnosis not present

## 2020-01-21 DIAGNOSIS — R0600 Dyspnea, unspecified: Secondary | ICD-10-CM | POA: Diagnosis not present

## 2020-01-21 DIAGNOSIS — M6389 Disorders of muscle in diseases classified elsewhere, multiple sites: Secondary | ICD-10-CM | POA: Diagnosis not present

## 2020-01-21 DIAGNOSIS — M6281 Muscle weakness (generalized): Secondary | ICD-10-CM | POA: Diagnosis not present

## 2020-01-22 DIAGNOSIS — M6281 Muscle weakness (generalized): Secondary | ICD-10-CM | POA: Diagnosis not present

## 2020-01-22 DIAGNOSIS — R0600 Dyspnea, unspecified: Secondary | ICD-10-CM | POA: Diagnosis not present

## 2020-01-22 DIAGNOSIS — M6389 Disorders of muscle in diseases classified elsewhere, multiple sites: Secondary | ICD-10-CM | POA: Diagnosis not present

## 2020-01-22 DIAGNOSIS — I509 Heart failure, unspecified: Secondary | ICD-10-CM | POA: Diagnosis not present

## 2020-01-23 DIAGNOSIS — M6281 Muscle weakness (generalized): Secondary | ICD-10-CM | POA: Diagnosis not present

## 2020-01-23 DIAGNOSIS — M6389 Disorders of muscle in diseases classified elsewhere, multiple sites: Secondary | ICD-10-CM | POA: Diagnosis not present

## 2020-01-23 DIAGNOSIS — I509 Heart failure, unspecified: Secondary | ICD-10-CM | POA: Diagnosis not present

## 2020-01-23 DIAGNOSIS — R0600 Dyspnea, unspecified: Secondary | ICD-10-CM | POA: Diagnosis not present

## 2020-01-23 DIAGNOSIS — I5020 Unspecified systolic (congestive) heart failure: Secondary | ICD-10-CM | POA: Diagnosis not present

## 2020-01-24 ENCOUNTER — Emergency Department (HOSPITAL_COMMUNITY): Payer: Medicare HMO

## 2020-01-24 ENCOUNTER — Emergency Department (HOSPITAL_COMMUNITY)
Admission: EM | Admit: 2020-01-24 | Discharge: 2020-01-24 | Disposition: A | Discharge status: Home / Self Care | Payer: Medicare HMO | Attending: Emergency Medicine | Admitting: Emergency Medicine

## 2020-01-24 ENCOUNTER — Encounter (HOSPITAL_COMMUNITY): Payer: Self-pay | Admitting: Emergency Medicine

## 2020-01-24 ENCOUNTER — Other Ambulatory Visit: Payer: Self-pay

## 2020-01-24 DIAGNOSIS — I5020 Unspecified systolic (congestive) heart failure: Secondary | ICD-10-CM | POA: Diagnosis not present

## 2020-01-24 DIAGNOSIS — R58 Hemorrhage, not elsewhere classified: Secondary | ICD-10-CM | POA: Diagnosis not present

## 2020-01-24 DIAGNOSIS — G9341 Metabolic encephalopathy: Secondary | ICD-10-CM | POA: Diagnosis not present

## 2020-01-24 DIAGNOSIS — I10 Essential (primary) hypertension: Secondary | ICD-10-CM | POA: Diagnosis not present

## 2020-01-24 DIAGNOSIS — K449 Diaphragmatic hernia without obstruction or gangrene: Secondary | ICD-10-CM | POA: Diagnosis not present

## 2020-01-24 DIAGNOSIS — Z7901 Long term (current) use of anticoagulants: Secondary | ICD-10-CM | POA: Diagnosis not present

## 2020-01-24 DIAGNOSIS — Z85828 Personal history of other malignant neoplasm of skin: Secondary | ICD-10-CM | POA: Insufficient documentation

## 2020-01-24 DIAGNOSIS — Z7982 Long term (current) use of aspirin: Secondary | ICD-10-CM | POA: Insufficient documentation

## 2020-01-24 DIAGNOSIS — R42 Dizziness and giddiness: Secondary | ICD-10-CM | POA: Diagnosis not present

## 2020-01-24 DIAGNOSIS — K8020 Calculus of gallbladder without cholecystitis without obstruction: Secondary | ICD-10-CM | POA: Diagnosis not present

## 2020-01-24 DIAGNOSIS — Z79899 Other long term (current) drug therapy: Secondary | ICD-10-CM | POA: Insufficient documentation

## 2020-01-24 DIAGNOSIS — K5730 Diverticulosis of large intestine without perforation or abscess without bleeding: Secondary | ICD-10-CM | POA: Insufficient documentation

## 2020-01-24 DIAGNOSIS — K625 Hemorrhage of anus and rectum: Secondary | ICD-10-CM | POA: Insufficient documentation

## 2020-01-24 DIAGNOSIS — K922 Gastrointestinal hemorrhage, unspecified: Secondary | ICD-10-CM | POA: Diagnosis not present

## 2020-01-24 DIAGNOSIS — Z87891 Personal history of nicotine dependence: Secondary | ICD-10-CM | POA: Insufficient documentation

## 2020-01-24 DIAGNOSIS — J45909 Unspecified asthma, uncomplicated: Secondary | ICD-10-CM | POA: Diagnosis not present

## 2020-01-24 DIAGNOSIS — N939 Abnormal uterine and vaginal bleeding, unspecified: Secondary | ICD-10-CM | POA: Insufficient documentation

## 2020-01-24 DIAGNOSIS — R279 Unspecified lack of coordination: Secondary | ICD-10-CM | POA: Diagnosis not present

## 2020-01-24 DIAGNOSIS — I959 Hypotension, unspecified: Secondary | ICD-10-CM | POA: Diagnosis not present

## 2020-01-24 DIAGNOSIS — N390 Urinary tract infection, site not specified: Secondary | ICD-10-CM | POA: Diagnosis not present

## 2020-01-24 DIAGNOSIS — I82402 Acute embolism and thrombosis of unspecified deep veins of left lower extremity: Secondary | ICD-10-CM | POA: Diagnosis not present

## 2020-01-24 DIAGNOSIS — Z7401 Bed confinement status: Secondary | ICD-10-CM | POA: Diagnosis not present

## 2020-01-24 LAB — CBC WITH DIFFERENTIAL/PLATELET
Abs Immature Granulocytes: 0.03 10*3/uL (ref 0.00–0.07)
Basophils Absolute: 0.1 10*3/uL (ref 0.0–0.1)
Basophils Relative: 1 %
Eosinophils Absolute: 0.2 10*3/uL (ref 0.0–0.5)
Eosinophils Relative: 4 %
HCT: 35.7 % — ABNORMAL LOW (ref 36.0–46.0)
Hemoglobin: 11.1 g/dL — ABNORMAL LOW (ref 12.0–15.0)
Immature Granulocytes: 1 %
Lymphocytes Relative: 11 %
Lymphs Abs: 0.6 10*3/uL — ABNORMAL LOW (ref 0.7–4.0)
MCH: 29.7 pg (ref 26.0–34.0)
MCHC: 31.1 g/dL (ref 30.0–36.0)
MCV: 95.5 fL (ref 80.0–100.0)
Monocytes Absolute: 0.3 10*3/uL (ref 0.1–1.0)
Monocytes Relative: 6 %
Neutro Abs: 4 10*3/uL (ref 1.7–7.7)
Neutrophils Relative %: 77 %
Platelets: 197 10*3/uL (ref 150–400)
RBC: 3.74 MIL/uL — ABNORMAL LOW (ref 3.87–5.11)
RDW: 13.3 % (ref 11.5–15.5)
WBC: 5.2 10*3/uL (ref 4.0–10.5)
nRBC: 0 % (ref 0.0–0.2)

## 2020-01-24 LAB — COMPREHENSIVE METABOLIC PANEL
ALT: 11 U/L (ref 0–44)
AST: 13 U/L — ABNORMAL LOW (ref 15–41)
Albumin: 2.6 g/dL — ABNORMAL LOW (ref 3.5–5.0)
Alkaline Phosphatase: 85 U/L (ref 38–126)
Anion gap: 8 (ref 5–15)
BUN: 19 mg/dL (ref 8–23)
CO2: 26 mmol/L (ref 22–32)
Calcium: 8.8 mg/dL — ABNORMAL LOW (ref 8.9–10.3)
Chloride: 100 mmol/L (ref 98–111)
Creatinine, Ser: 0.99 mg/dL (ref 0.44–1.00)
GFR, Estimated: >60 mL/min (ref >60)
Glucose, Bld: 126 mg/dL — ABNORMAL HIGH (ref 70–99)
Potassium: 3.5 mmol/L (ref 3.5–5.1)
Sodium: 134 mmol/L — ABNORMAL LOW (ref 135–145)
Total Bilirubin: 0.9 mg/dL (ref 0.3–1.2)
Total Protein: 5.7 g/dL — ABNORMAL LOW (ref 6.5–8.1)

## 2020-01-24 LAB — TYPE AND SCREEN
ABO/RH(D): B POS
Antibody Screen: NEGATIVE

## 2020-01-24 LAB — POC OCCULT BLOOD, ED: Fecal Occult Bld: NEGATIVE

## 2020-01-24 LAB — LIPASE, BLOOD: Lipase: 35 U/L (ref 11–51)

## 2020-01-24 NOTE — ED Triage Notes (Signed)
C/o of vaginal bleeding which started on yesterday.  Soaked 2 pads on yesterday.  EMS reports BP 120/78 HR 78 Sats 98%.

## 2020-01-24 NOTE — ED Provider Notes (Signed)
[facility] EMERGENCY DEPARTMENT Provider Note   CSN: [id number] Arrival date & time: 01/24/20  6720     History Chief Complaint  Patient presents with  . Vaginal Bleeding    Nicole [name] is a 69 y.o. female.  Patient sent in from [facility] brought in by EMS.  Complaint of vaginal bleeding which started yesterday stated soaked 2 pads yesterday.  Patient's vital signs in route were normal.  Patient states that she feels a little lightheaded.  But does not feel like she is going to pass out.  No abdominal pain.  Patient with a past history of chronic alcohol abuse.  Patient states it was red blood and there was large amounts of it.  Patient is on the blood thinners Xarelto following the identification of a proximal deep vein thrombosis on November 22.        Past Medical History:  Diagnosis Date  . Anemia   . Arthritis   . Asthma    as a child  . Atrial fibrillation with RVR ([place])   . Cancer ([place])    right kidney  . Chronic alcoholism ([place])   . Chronic left shoulder pain   . Dysrhythmia    Atrial Fib with RVR   . GERD (gastroesophageal reflux disease)   . Heartburn   . Hiatal hernia 12/17/2016   stomach noted in chest on CT scan  . History of blood transfusion   . Hypertension   . Memory loss   . Schizophrenia ([place])   . Seizures ([place])     had when she had period and when she ovalate  . Thyroid nodule     Patient Active Problem List   Diagnosis Date Noted  . Increased ammonia level   . Palliative care by specialist   . Goals of care, counseling/discussion   . AKI (acute kidney injury) ([place])   . Hepatic encephalopathy ([place]) 11/01/2019  . Hypotension 11/01/2019  . Jaundice 11/01/2019  . Hyperbilirubinemia 11/01/2019  . Hyperammonemia ([place]) 11/01/2019  . Alcoholic hepatitis 94/70/9628  . Acute liver failure 10/31/2019  . Acute respiratory failure with hypoxia ([place])   . Epigastric abdominal tenderness 12/18/2016  . Gastric mass 12/17/2016    Class:  Acute  . Right renal mass 12/17/2016    Class: Acute  . Diverticulosis of sigmoid colon 12/17/2016  . Hiatal hernia 12/17/2016    Class: Acute  . Chronic alcoholism ([place]) 12/17/2016    Class: Chronic  . Chronic left shoulder pain 12/17/2016    Class: Chronic  . Arthritis 12/17/2016    Class: Chronic  . Hyponatremia 12/17/2016    Class: Acute  . Hypokalemia 12/17/2016    Class: Acute  . Anemia 12/17/2016  . Atrial fibrillation with rapid ventricular response ([place]) 12/17/2016    Class: Acute    Past Surgical History:  Procedure Laterality Date  . ANKLE FRACTURE SURGERY Right 2008   screws  . ANKLE FRACTURE SURGERY Left 2008   plates  . ESOPHAGOGASTRODUODENOSCOPY (EGD) WITH PROPOFOL N/A 12/18/2016   Procedure: ESOPHAGOGASTRODUODENOSCOPY (EGD) WITH PROPOFOL;  Surgeon: Wilford Corner, MD;  Location: [place];  Service: Endoscopy;  Laterality: N/A;  . NASAL SINUS SURGERY Left 11/17/2017   Procedure: ENDOSCOPIC SINUS approach;  Surgeon: Melida Quitter, MD;  Location: [place];  Service: ENT;  Laterality: Left;  . NEPHRECTOMY Right    robotic riight radical   . ORIF ORBITAL FRACTURE Left 11/17/2017   Procedure: Trans Antral Left sided orbital floor fracture repair with dissovable floor implant;  Surgeon: Melida Quitter, MD;  Location: [facility] OR;  Service: ENT;  Laterality: Left;     OB History   No obstetric history on file.     Family History  Problem Relation Age of Onset  . Stroke Mother   . Heart attack Father   . Ulcers Father   . Diabetes Sister   . Cancer Brother   . Diabetes Sister   . Diabetes Sister     Social History   Tobacco Use  . Smoking status: Former Smoker    Years: 15.00    Types: Cigarettes    Quit date: 11/14/2007    Years since quitting: 12.2  . Smokeless tobacco: Never Used  Vaping Use  . Vaping Use: Never used  Substance Use Topics  . Alcohol use: Not Currently    Comment: none since fall 11/13/17  . Drug use: No    Home  Medications Prior to Admission medications   Medication Sig Start Date End Date Taking? Authorizing Provider  aspirin 81 MG EC tablet Take by mouth. 12/26/19 01/25/20  [provider]  cefdinir (OMNICEF) 300 MG capsule Take 1 capsule (300 mg total) by mouth every 12 (twelve) hours. X 4 days 11/09/19   Orson Eva, MD  diclofenac Sodium (VOLTAREN) 1 % GEL  12/17/19   [provider]  digoxin (LANOXIN) 0.125 MG tablet Take by mouth. 12/27/19 12/26/20  [provider]  ferrous sulfate 325 (65 FE) MG tablet Take 325 mg by mouth daily with breakfast.     [provider]  folic acid (FOLVITE) 1 MG tablet Take 1 tablet (1 mg total) by mouth daily. 11/09/19   Orson Eva, MD  furosemide (LASIX) 20 MG tablet Take by mouth. 12/26/19 01/25/20  [provider]  lactulose (CHRONULAC) 10 GM/15ML solution Take 15 mLs (10 g total) by mouth 2 (two) times daily. 11/09/19   Orson Eva, MD  Magnesium 250 MG TABS Take 2 tablets by mouth daily.    [provider]  metoprolol tartrate (LOPRESSOR) 50 MG tablet Take 50 mg by mouth 2 (two) times daily.    [provider]  Multiple Vitamins-Minerals (MULTIVITAMIN WITH MINERALS) tablet Take 1 tablet by mouth daily.    [provider]  omeprazole (PRILOSEC) 20 MG capsule Take 20 mg by mouth daily.    [provider]  potassium chloride SA (KLOR-CON) 20 MEQ tablet Take by mouth. 12/26/19 01/10/20  [provider]  predniSONE (DELTASONE) 10 MG tablet Take 3 tablets (30 mg total) by mouth daily. X 7 days; then 2 tabs (20 mg) daily x 7 days; then 1 tab (10 mg) daily x 7 days; then 1/2 tab (5 mg) daily x 7 days 11/09/19   Tat, Shanon Brow, MD  Propylene Glycol (SYSTANE COMPLETE) 0.6 % SOLN Place 1 drop into both eyes See admin instructions. Instill one drop into both eyes every morning, may also use later in the day as needed for dry eyes    [provider]  rifaximin (XIFAXAN) 550 MG TABS tablet  Take 1 tablet (550 mg total) by mouth 2 (two) times daily. 11/09/19   Orson Eva, MD  RIVAROXABAN Alveda Reasons) VTE STARTER PACK (15 & 20 MG) Follow package directions: Take one 15mg  tablet by mouth twice a day. On day 22, switch to one 20mg  tablet once a day. Take with food. 01/06/20   Fransico Meadow, PA-C  thiamine 100 MG tablet Take 1 tablet (100 mg total) by mouth daily. 11/09/19  Orson Eva, MD    Allergies    Other, Shellfish allergy, and Iodinated diagnostic agents  Review of Systems   Review of Systems  Constitutional: Negative for chills and fever.  HENT: Negative for rhinorrhea and sore throat.   Eyes: Negative for visual disturbance.  Respiratory: Negative for cough and shortness of breath.   Cardiovascular: Negative for chest pain and leg swelling.  Gastrointestinal: Negative for abdominal pain, diarrhea, nausea and vomiting.  Genitourinary: Positive for vaginal bleeding. Negative for dysuria.  Musculoskeletal: Negative for back pain and neck pain.  Skin: Negative for rash.  Neurological: Negative for dizziness, light-headedness and headaches.  Hematological: Does not bruise/bleed easily.  Psychiatric/Behavioral: Negative for confusion.    Physical Exam Updated Vital Signs BP 115/80   Pulse 65   Temp 97.9 F (36.6 C) (Oral)   Resp 16   Ht 1.727 m (5\' 8" )   Wt 68 kg   SpO2 100%   BMI 22.81 kg/m   Physical Exam Vitals and nursing note reviewed.  Constitutional:      General: She is not in acute distress.    Appearance: Normal appearance. She is well-developed and well-nourished.  HENT:     Head: Normocephalic and atraumatic.  Eyes:     Extraocular Movements: Extraocular movements intact.     Conjunctiva/sclera: Conjunctivae normal.     Pupils: Pupils are equal, round, and reactive to light.  Cardiovascular:     Rate and Rhythm: Normal rate and regular rhythm.     Heart sounds: No murmur heard.   Pulmonary:     Effort: Pulmonary effort is normal. No  respiratory distress.     Breath sounds: Normal breath sounds.  Abdominal:     Palpations: Abdomen is soft.     Tenderness: There is no abdominal tenderness.  Genitourinary:    Rectum: Guaiac result negative.     Comments: External genitalia without any acute findings.  Vaginal vault with speculum exam no evidence of any blood.  There was a little bit of some scant thin questionable blood.  Bimanual without any uterine tenderness or adnexal abnormalities.  Perianal area without evidence of any thrombosed hemorrhoid or prolapsed internal hemorrhoids.  Light brown stool in vault.  Heme-negative. Musculoskeletal:        General: No swelling or edema.     Cervical back: Neck supple.  Skin:    General: Skin is warm and dry.     Capillary Refill: Capillary refill takes less than 2 seconds.  Neurological:     General: No focal deficit present.     Mental Status: She is alert and oriented to person, place, and time.     Cranial Nerves: No cranial nerve deficit.     Sensory: No sensory deficit.  Psychiatric:        Mood and Affect: Mood and affect normal.     ED Results / Procedures / Treatments   Labs (all labs ordered are listed, but only abnormal results are displayed) Labs Reviewed  COMPREHENSIVE METABOLIC PANEL - Abnormal; Notable for the following components:      Result Value   Sodium 134 (*)    Glucose, Bld 126 (*)    Calcium 8.8 (*)    Total Protein 5.7 (*)    Albumin 2.6 (*)    AST 13 (*)    All other components within normal limits  CBC WITH DIFFERENTIAL/PLATELET - Abnormal; Notable for the following components:   RBC 3.74 (*)    Hemoglobin 11.1 (*)  HCT 35.7 (*)    Lymphs Abs 0.6 (*)    All other components within normal limits  LIPASE, BLOOD  POC OCCULT BLOOD, ED  TYPE AND SCREEN    EKG None  Radiology CT Abdomen Pelvis Wo Contrast  Result Date: 01/24/2020 CLINICAL DATA:  GI bleeding.  Vaginal bleeding. EXAM: CT ABDOMEN AND PELVIS WITHOUT CONTRAST  TECHNIQUE: Multidetector CT imaging of the abdomen and pelvis was performed following the standard protocol without IV contrast. COMPARISON:  10/31/2019 FINDINGS: Lower chest: Large sliding hiatal hernia. Band of scarring/atelectasis at the left lung base. Hepatobiliary: Resolved hepatic steatosis since prior. There is morphologic changes which could reflect cirrhosis.Cholelithiasis. Pancreas: Loss of fat and distorted/spiculated soft tissue planes around the pancreatic body and head with nodular and wispy density in the adjacent mesentery. Findings are indistinguishable from the ventral portal vein confluence. Based on previous imaging there has been prior pancreatitis and pseudocyst formation. Spleen: Chronic generous size which could be from portal hypertension, dimensions measuring 14 cm. Adrenals/Urinary Tract: Negative adrenals. No hydronephrosis or stone. Right nephrectomy, reportedly for cancer. Small bubble of gas in the urinary bladder, usually from sampling. Stomach/Bowel: There is a gas and fluid collection beneath the diaphragm in the midline measuring up to 4 cm which is contiguous with the distal transverse colon via a soft tissue density track like structure. There is an inferior track like structure extending towards the pancreatic body. A pseudocyst was seen in the pancreas in 2018 and a fluid collection was seen in this location June 2021 but not September 2021. No obstruction. No appendicitis. Colonic diverticulosis. No other gross cause for bleeding. Vascular/Lymphatic: No acute vascular abnormality. No mass or adenopathy. Reproductive:No explanation for vaginal bleeding. No evident thickening of the endometrium or continuity with the bowel. Other: No ascites or pneumoperitoneum. Asymmetric subcutaneous stranding along the left upper abdominal wall which was also seen previously and could be positional anasarca. No reported history of trauma. Musculoskeletal: No acute abnormalities. Multilevel  disc degeneration. Severe left hip osteoarthritis. IMPRESSION: 1. Fat nodularity and spiculation around the pancreas which could reflect pancreatitis and/or mass. Please correlate with liver enzymes and recommend pancreas MRI with contrast after resolution of any active pancreatitis. 2. 4 cm subdiaphragmatic gas collection which is contiguous with the transverse colon and pancreatic body via soft tissue density track like structures. Suspect this is sequela of prior pseudocysts based on prior abdominal CTs. The gas is new from a September 2021 CT. There is a notable lack of surrounding inflammation. 3. Resolved hepatic steatosis when compared to September 2021. There is concern for cirrhosis. 4. Large hiatal hernia. 5. Colonic diverticulosis. 6. Cholelithiasis. Electronically Signed   By: Monte Fantasia M.D.   On: 01/24/2020 09:54   US Venous Img Lower  Left (DVT Study)  Result Date: 01/24/2020 CLINICAL DATA:  Left lower extremity DVT.  Patient on Xarelto. EXAM: LEFT LOWER EXTREMITY VENOUS DOPPLER ULTRASOUND TECHNIQUE: Gray-scale sonography with compression, as well as color and duplex ultrasound, were performed to evaluate the deep venous system(s) from the level of the common femoral vein through the popliteal and proximal calf veins. COMPARISON:  01/06/2020. FINDINGS: VENOUS Normal compressibility of the common femoral, superficial femoral, and popliteal veins, as well as the visualized calf veins. Visualized portions of profunda femoral vein and great saphenous vein unremarkable. No filling defects to suggest DVT on grayscale or color Doppler imaging. Doppler waveforms show normal direction of venous flow, normal respiratory plasticity and response to augmentation. Limited views of the contralateral common  femoral vein are unremarkable. OTHER None. Limitations: none IMPRESSION: Negative. Electronically Signed   By: Lajean Manes M.D.   On: 01/24/2020 10:34    Procedures Procedures (including critical  care time)  Medications Ordered in ED Medications - No data to display  ED Course  I have reviewed the triage vital signs and the nursing notes.  Pertinent labs & imaging results that were available during my care of the patient were reviewed by me and considered in my medical decision making (see chart for details).    MDM Rules/Calculators/A&P                          Work-up on exam not able to identify any significant blood loss in the vaginal vault or rectal area.  Was may be some scant blood in the vaginal vault.  Stool was Hemoccult negative so unlikely to be GI source.  CT scan of the abdomen raise more concerns about around the pancreas area the patient's lipase and liver function test without significant abnormalities.  Patient without a leukocytosis and hemoglobin is 11.1.  Not significantly changed from before.  Also did Doppler study of the left leg.  The proximal DVT is resolved.  But since there is no significant bleeding and hemodynamically stable feel the patient can continue the Xarelto.  We will have the patient follow-up with OB/GYN for more thorough evaluation of her uterus.  Patient stable to return to nursing facility.    Final Clinical Impression(s) / ED Diagnoses Final diagnoses:  Vaginal bleeding  Rectal bleeding    Rx / DC Orders ED Discharge Orders    None       Fredia Sorrow, MD 01/24/20 1329

## 2020-01-24 NOTE — ED Triage Notes (Signed)
Pt denies pain at this time

## 2020-01-24 NOTE — ED Notes (Signed)
Pt is in hallway bed.

## 2020-01-24 NOTE — Discharge Instructions (Signed)
Work-up in the emergency department without evidence of any gross blood in the vaginal vault or rectal area.  There was some trace blood in the rectum and some trace very scant blood in the vaginal vault.  CT scan of the abdomen raise no concerns with the reproductive organs.  But patient is convinced that is vaginal bleeding we will have her follow-up with OB/GYN.  Also have her follow-up with her primary care doctor for the possibility of the rectal bleeding.  Patient stable and can be discharged back to nursing facility.  Patient can continue the Xarelto.  However we did Doppler the left lower extremity and the deep vein thrombosis that was present at the end of November has resolved.

## 2020-01-27 DIAGNOSIS — R319 Hematuria, unspecified: Secondary | ICD-10-CM | POA: Diagnosis not present

## 2020-01-27 DIAGNOSIS — K219 Gastro-esophageal reflux disease without esophagitis: Secondary | ICD-10-CM | POA: Diagnosis not present

## 2020-01-27 DIAGNOSIS — I509 Heart failure, unspecified: Secondary | ICD-10-CM | POA: Diagnosis not present

## 2020-01-27 DIAGNOSIS — N390 Urinary tract infection, site not specified: Secondary | ICD-10-CM | POA: Diagnosis not present

## 2020-01-27 DIAGNOSIS — R569 Unspecified convulsions: Secondary | ICD-10-CM | POA: Diagnosis not present

## 2020-01-27 DIAGNOSIS — I4891 Unspecified atrial fibrillation: Secondary | ICD-10-CM | POA: Diagnosis not present

## 2020-01-27 DIAGNOSIS — M6389 Disorders of muscle in diseases classified elsewhere, multiple sites: Secondary | ICD-10-CM | POA: Diagnosis not present

## 2020-01-27 DIAGNOSIS — F209 Schizophrenia, unspecified: Secondary | ICD-10-CM | POA: Diagnosis not present

## 2020-01-27 DIAGNOSIS — R0600 Dyspnea, unspecified: Secondary | ICD-10-CM | POA: Diagnosis not present

## 2020-01-27 DIAGNOSIS — M6281 Muscle weakness (generalized): Secondary | ICD-10-CM | POA: Diagnosis not present

## 2020-01-28 ENCOUNTER — Other Ambulatory Visit: Payer: Self-pay | Admitting: *Deleted

## 2020-01-28 ENCOUNTER — Telehealth: Payer: Self-pay | Admitting: Obstetrics & Gynecology

## 2020-01-28 DIAGNOSIS — N950 Postmenopausal bleeding: Secondary | ICD-10-CM

## 2020-01-28 DIAGNOSIS — M6389 Disorders of muscle in diseases classified elsewhere, multiple sites: Secondary | ICD-10-CM | POA: Diagnosis not present

## 2020-01-28 DIAGNOSIS — R0600 Dyspnea, unspecified: Secondary | ICD-10-CM | POA: Diagnosis not present

## 2020-01-28 DIAGNOSIS — M6281 Muscle weakness (generalized): Secondary | ICD-10-CM | POA: Diagnosis not present

## 2020-01-28 DIAGNOSIS — I509 Heart failure, unspecified: Secondary | ICD-10-CM | POA: Diagnosis not present

## 2020-01-28 NOTE — Telephone Encounter (Signed)
Contacted the Patient to inform her of her [place] appointment at [facility], patient informed me that I will need to speak with someone in [place] home because she does not drive and they will need to take her to her appointment. I called [facility] and they transferred me to someone that would be able to help, their Voicemail was full an d I was not able to leave a message.

## 2020-01-29 DIAGNOSIS — M6389 Disorders of muscle in diseases classified elsewhere, multiple sites: Secondary | ICD-10-CM | POA: Diagnosis not present

## 2020-01-29 DIAGNOSIS — I509 Heart failure, unspecified: Secondary | ICD-10-CM | POA: Diagnosis not present

## 2020-01-29 DIAGNOSIS — M6281 Muscle weakness (generalized): Secondary | ICD-10-CM | POA: Diagnosis not present

## 2020-01-29 DIAGNOSIS — R0600 Dyspnea, unspecified: Secondary | ICD-10-CM | POA: Diagnosis not present

## 2020-01-30 DIAGNOSIS — K219 Gastro-esophageal reflux disease without esophagitis: Secondary | ICD-10-CM | POA: Diagnosis not present

## 2020-01-30 DIAGNOSIS — R569 Unspecified convulsions: Secondary | ICD-10-CM | POA: Diagnosis not present

## 2020-01-30 DIAGNOSIS — I10 Essential (primary) hypertension: Secondary | ICD-10-CM | POA: Diagnosis not present

## 2020-01-30 DIAGNOSIS — F209 Schizophrenia, unspecified: Secondary | ICD-10-CM | POA: Diagnosis not present

## 2020-01-30 DIAGNOSIS — D649 Anemia, unspecified: Secondary | ICD-10-CM | POA: Diagnosis not present

## 2020-01-30 DIAGNOSIS — I509 Heart failure, unspecified: Secondary | ICD-10-CM | POA: Diagnosis not present

## 2020-01-30 DIAGNOSIS — K7010 Alcoholic hepatitis without ascites: Secondary | ICD-10-CM | POA: Diagnosis not present

## 2020-01-30 DIAGNOSIS — M6281 Muscle weakness (generalized): Secondary | ICD-10-CM | POA: Diagnosis not present

## 2020-01-30 DIAGNOSIS — R0600 Dyspnea, unspecified: Secondary | ICD-10-CM | POA: Diagnosis not present

## 2020-01-30 DIAGNOSIS — K7290 Hepatic failure, unspecified without coma: Secondary | ICD-10-CM | POA: Diagnosis not present

## 2020-01-30 DIAGNOSIS — M6389 Disorders of muscle in diseases classified elsewhere, multiple sites: Secondary | ICD-10-CM | POA: Diagnosis not present

## 2020-01-30 DIAGNOSIS — G9341 Metabolic encephalopathy: Secondary | ICD-10-CM | POA: Diagnosis not present

## 2020-01-31 ENCOUNTER — Ambulatory Visit (HOSPITAL_COMMUNITY): Admission: RE | Admit: 2020-01-31 | Payer: Medicare HMO | Source: Ambulatory Visit

## 2020-01-31 DIAGNOSIS — R0600 Dyspnea, unspecified: Secondary | ICD-10-CM | POA: Diagnosis not present

## 2020-01-31 DIAGNOSIS — M6281 Muscle weakness (generalized): Secondary | ICD-10-CM | POA: Diagnosis not present

## 2020-01-31 DIAGNOSIS — M6389 Disorders of muscle in diseases classified elsewhere, multiple sites: Secondary | ICD-10-CM | POA: Diagnosis not present

## 2020-01-31 DIAGNOSIS — I509 Heart failure, unspecified: Secondary | ICD-10-CM | POA: Diagnosis not present

## 2020-02-03 DIAGNOSIS — M6389 Disorders of muscle in diseases classified elsewhere, multiple sites: Secondary | ICD-10-CM | POA: Diagnosis not present

## 2020-02-03 DIAGNOSIS — I509 Heart failure, unspecified: Secondary | ICD-10-CM | POA: Diagnosis not present

## 2020-02-03 DIAGNOSIS — R0600 Dyspnea, unspecified: Secondary | ICD-10-CM | POA: Diagnosis not present

## 2020-02-03 DIAGNOSIS — M6281 Muscle weakness (generalized): Secondary | ICD-10-CM | POA: Diagnosis not present

## 2020-02-04 DIAGNOSIS — R0600 Dyspnea, unspecified: Secondary | ICD-10-CM | POA: Diagnosis not present

## 2020-02-04 DIAGNOSIS — M6281 Muscle weakness (generalized): Secondary | ICD-10-CM | POA: Diagnosis not present

## 2020-02-04 DIAGNOSIS — M6389 Disorders of muscle in diseases classified elsewhere, multiple sites: Secondary | ICD-10-CM | POA: Diagnosis not present

## 2020-02-04 DIAGNOSIS — I509 Heart failure, unspecified: Secondary | ICD-10-CM | POA: Diagnosis not present

## 2020-02-05 DIAGNOSIS — M6389 Disorders of muscle in diseases classified elsewhere, multiple sites: Secondary | ICD-10-CM | POA: Diagnosis not present

## 2020-02-05 DIAGNOSIS — I509 Heart failure, unspecified: Secondary | ICD-10-CM | POA: Diagnosis not present

## 2020-02-05 DIAGNOSIS — M6281 Muscle weakness (generalized): Secondary | ICD-10-CM | POA: Diagnosis not present

## 2020-02-05 DIAGNOSIS — R0600 Dyspnea, unspecified: Secondary | ICD-10-CM | POA: Diagnosis not present

## 2020-02-06 DIAGNOSIS — R0600 Dyspnea, unspecified: Secondary | ICD-10-CM | POA: Diagnosis not present

## 2020-02-06 DIAGNOSIS — M6281 Muscle weakness (generalized): Secondary | ICD-10-CM | POA: Diagnosis not present

## 2020-02-06 DIAGNOSIS — M6389 Disorders of muscle in diseases classified elsewhere, multiple sites: Secondary | ICD-10-CM | POA: Diagnosis not present

## 2020-02-06 DIAGNOSIS — I509 Heart failure, unspecified: Secondary | ICD-10-CM | POA: Diagnosis not present

## 2020-02-07 DIAGNOSIS — M6389 Disorders of muscle in diseases classified elsewhere, multiple sites: Secondary | ICD-10-CM | POA: Diagnosis not present

## 2020-02-07 DIAGNOSIS — I509 Heart failure, unspecified: Secondary | ICD-10-CM | POA: Diagnosis not present

## 2020-02-07 DIAGNOSIS — M6281 Muscle weakness (generalized): Secondary | ICD-10-CM | POA: Diagnosis not present

## 2020-02-07 DIAGNOSIS — R0600 Dyspnea, unspecified: Secondary | ICD-10-CM | POA: Diagnosis not present

## 2020-02-10 DIAGNOSIS — I509 Heart failure, unspecified: Secondary | ICD-10-CM | POA: Diagnosis not present

## 2020-02-10 DIAGNOSIS — R0600 Dyspnea, unspecified: Secondary | ICD-10-CM | POA: Diagnosis not present

## 2020-02-10 DIAGNOSIS — M6281 Muscle weakness (generalized): Secondary | ICD-10-CM | POA: Diagnosis not present

## 2020-02-10 DIAGNOSIS — M6389 Disorders of muscle in diseases classified elsewhere, multiple sites: Secondary | ICD-10-CM | POA: Diagnosis not present

## 2020-02-11 DIAGNOSIS — I509 Heart failure, unspecified: Secondary | ICD-10-CM | POA: Diagnosis not present

## 2020-02-11 DIAGNOSIS — M6389 Disorders of muscle in diseases classified elsewhere, multiple sites: Secondary | ICD-10-CM | POA: Diagnosis not present

## 2020-02-11 DIAGNOSIS — R0600 Dyspnea, unspecified: Secondary | ICD-10-CM | POA: Diagnosis not present

## 2020-02-11 DIAGNOSIS — M6281 Muscle weakness (generalized): Secondary | ICD-10-CM | POA: Diagnosis not present

## 2020-02-12 DIAGNOSIS — M6389 Disorders of muscle in diseases classified elsewhere, multiple sites: Secondary | ICD-10-CM | POA: Diagnosis not present

## 2020-02-12 DIAGNOSIS — I509 Heart failure, unspecified: Secondary | ICD-10-CM | POA: Diagnosis not present

## 2020-02-12 DIAGNOSIS — M6281 Muscle weakness (generalized): Secondary | ICD-10-CM | POA: Diagnosis not present

## 2020-02-12 DIAGNOSIS — R0600 Dyspnea, unspecified: Secondary | ICD-10-CM | POA: Diagnosis not present

## 2020-02-13 DIAGNOSIS — R0600 Dyspnea, unspecified: Secondary | ICD-10-CM | POA: Diagnosis not present

## 2020-02-13 DIAGNOSIS — M6389 Disorders of muscle in diseases classified elsewhere, multiple sites: Secondary | ICD-10-CM | POA: Diagnosis not present

## 2020-02-13 DIAGNOSIS — I509 Heart failure, unspecified: Secondary | ICD-10-CM | POA: Diagnosis not present

## 2020-02-13 DIAGNOSIS — M6281 Muscle weakness (generalized): Secondary | ICD-10-CM | POA: Diagnosis not present

## 2020-02-14 DIAGNOSIS — M6281 Muscle weakness (generalized): Secondary | ICD-10-CM | POA: Diagnosis not present

## 2020-02-14 DIAGNOSIS — I509 Heart failure, unspecified: Secondary | ICD-10-CM | POA: Diagnosis not present

## 2020-02-14 DIAGNOSIS — R0600 Dyspnea, unspecified: Secondary | ICD-10-CM | POA: Diagnosis not present

## 2020-02-14 DIAGNOSIS — M6389 Disorders of muscle in diseases classified elsewhere, multiple sites: Secondary | ICD-10-CM | POA: Diagnosis not present

## 2020-02-15 DIAGNOSIS — I110 Hypertensive heart disease with heart failure: Secondary | ICD-10-CM | POA: Insufficient documentation

## 2020-02-18 ENCOUNTER — Ambulatory Visit: Payer: Medicare HMO | Admitting: Obstetrics & Gynecology

## 2020-03-02 ENCOUNTER — Ambulatory Visit: Payer: Medicare Other | Admitting: Podiatry

## 2020-03-16 ENCOUNTER — Other Ambulatory Visit: Payer: Self-pay

## 2020-03-16 ENCOUNTER — Encounter: Payer: Self-pay | Admitting: Podiatry

## 2020-03-16 ENCOUNTER — Ambulatory Visit (INDEPENDENT_AMBULATORY_CARE_PROVIDER_SITE_OTHER): Payer: Medicare Other | Admitting: Podiatry

## 2020-03-16 DIAGNOSIS — M79674 Pain in right toe(s): Secondary | ICD-10-CM

## 2020-03-16 DIAGNOSIS — I7300 Raynaud's syndrome without gangrene: Secondary | ICD-10-CM | POA: Diagnosis not present

## 2020-03-16 DIAGNOSIS — Z7901 Long term (current) use of anticoagulants: Secondary | ICD-10-CM

## 2020-03-16 DIAGNOSIS — L603 Nail dystrophy: Secondary | ICD-10-CM | POA: Diagnosis not present

## 2020-03-16 DIAGNOSIS — M79675 Pain in left toe(s): Secondary | ICD-10-CM

## 2020-03-16 NOTE — Progress Notes (Signed)
  Subjective:  Patient ID: Nicole [name], female    DOB: [birth year],  MRN: [id number]  Chief Complaint  Patient presents with  . routine foot care    Nail trim     70 y.o. female presents with the above complaint. History confirmed with patient. She is unable to cut her own nails, she tried and cut her toes, she is on a blood thinner.  Objective:  Physical Exam: Toes cyanotic and cool with slow capillary refill, no trophic changes or ulcerative lesions, normal +2 DP and PT pulses and normal sensory exam. Venous insufficiency. Dystrophic nails. Small areas of scab in corners of nail where she cut herself Assessment:   1. Dystrophic nail   2. Pain in toes of both feet   3. Long term current use of anticoagulant   4. Raynaud's disease without gangrene      Plan:  Patient was evaluated and treated and all questions answered.  Discussed the etiology and treatment options for the condition in detail with the patient. Recommended debridement of the nails today. Sharp and mechanical debridement performed of all painful and mycotic nails today. Nails debrided in length and thickness using a nail nipper to level of comfort. Discussed treatment options including appropriate shoe gear. Follow up as needed for painful nails.   Return in about 3 months (around 06/13/2020) for nail care.

## 2020-03-16 NOTE — Patient Instructions (Signed)
Call to schedule an appointment for your hip and shoulder pain:  [facility]: [address], [place], [place], [place] [address]  CONTACT Phone: [phone]  Phone: [phone]  Phone: [phone]  Fax: [phone]

## 2020-06-16 ENCOUNTER — Ambulatory Visit: Payer: Medicare (Managed Care) | Admitting: Podiatry

## 2020-06-20 ENCOUNTER — Encounter (HOSPITAL_COMMUNITY): Payer: Self-pay

## 2020-06-20 ENCOUNTER — Other Ambulatory Visit: Payer: Self-pay

## 2020-06-20 ENCOUNTER — Emergency Department (HOSPITAL_COMMUNITY)
Admission: EM | Admit: 2020-06-20 | Discharge: 2020-06-20 | Disposition: A | Discharge status: Home / Self Care | Payer: Medicare (Managed Care) | Attending: Emergency Medicine | Admitting: Emergency Medicine

## 2020-06-20 DIAGNOSIS — T148XXA Other injury of unspecified body region, initial encounter: Secondary | ICD-10-CM

## 2020-06-20 DIAGNOSIS — J45909 Unspecified asthma, uncomplicated: Secondary | ICD-10-CM | POA: Insufficient documentation

## 2020-06-20 DIAGNOSIS — W268XXA Contact with other sharp object(s), not elsewhere classified, initial encounter: Secondary | ICD-10-CM | POA: Insufficient documentation

## 2020-06-20 DIAGNOSIS — Z7901 Long term (current) use of anticoagulants: Secondary | ICD-10-CM | POA: Diagnosis not present

## 2020-06-20 DIAGNOSIS — I10 Essential (primary) hypertension: Secondary | ICD-10-CM | POA: Insufficient documentation

## 2020-06-20 DIAGNOSIS — I4891 Unspecified atrial fibrillation: Secondary | ICD-10-CM | POA: Insufficient documentation

## 2020-06-20 DIAGNOSIS — Z85528 Personal history of other malignant neoplasm of kidney: Secondary | ICD-10-CM | POA: Insufficient documentation

## 2020-06-20 DIAGNOSIS — S90415A Abrasion, left lesser toe(s), initial encounter: Secondary | ICD-10-CM | POA: Diagnosis not present

## 2020-06-20 DIAGNOSIS — Z87891 Personal history of nicotine dependence: Secondary | ICD-10-CM | POA: Diagnosis not present

## 2020-06-20 DIAGNOSIS — S99922A Unspecified injury of left foot, initial encounter: Secondary | ICD-10-CM | POA: Diagnosis present

## 2020-06-20 DIAGNOSIS — Z79899 Other long term (current) drug therapy: Secondary | ICD-10-CM | POA: Diagnosis not present

## 2020-06-20 MED ORDER — TETANUS-DIPHTH-ACELL PERTUSSIS 5-2.5-18.5 LF-MCG/0.5 IM SUSY: 0.5000 mL | PREFILLED_SYRINGE | Freq: Once | INTRAMUSCULAR | Status: DC

## 2020-06-20 NOTE — ED Notes (Signed)
Called c-com at this time for pt take home to [facility]

## 2020-06-20 NOTE — ED Notes (Signed)
Pt transported to SNF prior to signing discharge electronic form. DC paperwork has been reviewed with pt and she expressed understanding. Pt transported to [facility] via EMS. Pt requires ambulatory and mobility assistance and she is not independently ambulatory.

## 2020-06-20 NOTE — ED Provider Notes (Signed)
[facility] EMERGENCY DEPARTMENT Provider Note   CSN: [id number] Arrival date & time: 06/20/20  1854     History Chief Complaint  Patient presents with  . Toe Injury    Nicole [name] is a 70 y.o. female who presents for evaluation of injury to her left third and fourth toe.  She reports that last night, she was clipping her toenails and states that she excellently clipped part of the skin.  She states that since last night, has continuously bled.  She reports that this morning, she woke up and has soaked through the bandage.  She is on blood thinners (Xarelto).  Denies any numbness/weakness.  The history is provided by the patient.       Past Medical History:  Diagnosis Date  . Anemia   . Arthritis   . Asthma    as a child  . Atrial fibrillation with RVR ([place])   . Cancer ([place])    right kidney  . Chronic alcoholism ([place])   . Chronic left shoulder pain   . Dysrhythmia    Atrial Fib with RVR   . GERD (gastroesophageal reflux disease)   . Heartburn   . Hiatal hernia 12/17/2016   stomach noted in chest on CT scan  . History of blood transfusion   . Hypertension   . Memory loss   . Schizophrenia ([place])   . Seizures ([place])     had when she had period and when she ovalate  . Thyroid nodule     Patient Active Problem List   Diagnosis Date Noted  . Increased ammonia level   . Palliative care by specialist   . Goals of care, counseling/discussion   . AKI (acute kidney injury) ([place])   . Hepatic encephalopathy ([place]) 11/01/2019  . Hypotension 11/01/2019  . Jaundice 11/01/2019  . Hyperbilirubinemia 11/01/2019  . Hyperammonemia ([place]) 11/01/2019  . Alcoholic hepatitis 75/64/3329  . Acute liver failure 10/31/2019  . Acute respiratory failure with hypoxia ([place])   . Epigastric abdominal tenderness 12/18/2016  . Gastric mass 12/17/2016    Class: Acute  . Right renal mass 12/17/2016    Class: Acute  . Diverticulosis of sigmoid colon 12/17/2016  . Hiatal hernia 12/17/2016     Class: Acute  . Chronic alcoholism ([place]) 12/17/2016    Class: Chronic  . Chronic left shoulder pain 12/17/2016    Class: Chronic  . Arthritis 12/17/2016    Class: Chronic  . Hyponatremia 12/17/2016    Class: Acute  . Hypokalemia 12/17/2016    Class: Acute  . Anemia 12/17/2016  . Atrial fibrillation with rapid ventricular response ([place]) 12/17/2016    Class: Acute    Past Surgical History:  Procedure Laterality Date  . ANKLE FRACTURE SURGERY Right 2008   screws  . ANKLE FRACTURE SURGERY Left 2008   plates  . ESOPHAGOGASTRODUODENOSCOPY (EGD) WITH PROPOFOL N/A 12/18/2016   Procedure: ESOPHAGOGASTRODUODENOSCOPY (EGD) WITH PROPOFOL;  Surgeon: Wilford Corner, MD;  Location: [facility];  Service: Endoscopy;  Laterality: N/A;  . NASAL SINUS SURGERY Left 11/17/2017   Procedure: ENDOSCOPIC SINUS approach;  Surgeon: Melida Quitter, MD;  Location: [facility];  Service: ENT;  Laterality: Left;  . NEPHRECTOMY Right    robotic riight radical   . ORIF ORBITAL FRACTURE Left 11/17/2017   Procedure: Trans Antral Left sided orbital floor fracture repair with dissovable floor implant;  Surgeon: Melida Quitter, MD;  Location: [facility];  Service: ENT;  Laterality: Left;     OB History   No  obstetric history on file.     Family History  Problem Relation Age of Onset  . Stroke Mother   . Heart attack Father   . Ulcers Father   . Diabetes Sister   . Cancer Brother   . Diabetes Sister   . Diabetes Sister     Social History   Tobacco Use  . Smoking status: Former Smoker    Years: 15.00    Types: Cigarettes    Quit date: 11/14/2007    Years since quitting: 12.6  . Smokeless tobacco: Never Used  Vaping Use  . Vaping Use: Never used  Substance Use Topics  . Alcohol use: Not Currently    Comment: none since fall 11/13/17  . Drug use: No    Home Medications Prior to Admission medications   Medication Sig Start Date End Date Taking? Authorizing Provider  cefdinir (OMNICEF) 300 MG capsule Take  1 capsule (300 mg total) by mouth every 12 (twelve) hours. X 4 days 11/09/19   Orson Eva, MD  diclofenac Sodium (VOLTAREN) 1 % GEL  12/17/19   [provider]  digoxin (LANOXIN) 0.125 MG tablet Take by mouth. 12/27/19 12/26/20  [provider]  ferrous sulfate 325 (65 FE) MG tablet Take 325 mg by mouth daily with breakfast.     [provider]  folic acid (FOLVITE) 1 MG tablet Take 1 tablet (1 mg total) by mouth daily. 11/09/19   Orson Eva, MD  lactulose (CHRONULAC) 10 GM/15ML solution Take 15 mLs (10 g total) by mouth 2 (two) times daily. 11/09/19   Orson Eva, MD  Magnesium 250 MG TABS Take 2 tablets by mouth daily.    [provider]  metoprolol tartrate (LOPRESSOR) 50 MG tablet Take 50 mg by mouth 2 (two) times daily.    [provider]  Multiple Vitamins-Minerals (MULTIVITAMIN WITH MINERALS) tablet Take 1 tablet by mouth daily.    [provider]  omeprazole (PRILOSEC) 20 MG capsule Take 20 mg by mouth daily.    [provider]  potassium chloride SA (KLOR-CON) 20 MEQ tablet Take by mouth. 12/26/19 01/10/20  [provider]  predniSONE (DELTASONE) 10 MG tablet Take 3 tablets (30 mg total) by mouth daily. X 7 days; then 2 tabs (20 mg) daily x 7 days; then 1 tab (10 mg) daily x 7 days; then 1/2 tab (5 mg) daily x 7 days 11/09/19   Tat, Shanon Brow, MD  Propylene Glycol (SYSTANE COMPLETE) 0.6 % SOLN Place 1 drop into both eyes See admin instructions. Instill one drop into both eyes every morning, may also use later in the day as needed for dry eyes    [provider]  rifaximin (XIFAXAN) 550 MG TABS tablet Take 1 tablet (550 mg total) by mouth 2 (two) times daily. 11/09/19   Orson Eva, MD  RIVAROXABAN Alveda Reasons) VTE STARTER PACK (15 & 20 MG) Follow package directions: Take one 15mg  tablet by mouth twice a day. On day 22, switch to one 20mg  tablet once a day. Take with food. 01/06/20   Fransico Meadow, PA-C  thiamine 100 MG tablet  Take 1 tablet (100 mg total) by mouth daily. 11/09/19   Orson Eva, MD    Allergies    Other, Shellfish allergy, and Iodinated diagnostic agents  Review of Systems   Review of Systems  Skin: Positive for wound.  Neurological: Negative for numbness.  All other systems reviewed and are negative.   Physical Exam Updated Vital Signs BP 106/73 (  BP Location: Right Arm)   Pulse 69   Temp 98.6 F (37 C) (Oral)   Resp 17   Ht 5\' 9"  (1.753 m)   Wt 61.2 kg   SpO2 96%   BMI 19.94 kg/m   Physical Exam Vitals and nursing note reviewed.  Constitutional:      Appearance: She is well-developed.  HENT:     Head: Normocephalic and atraumatic.  Eyes:     General: No scleral icterus.       Right eye: No discharge.        Left eye: No discharge.     Conjunctiva/sclera: Conjunctivae normal.  Cardiovascular:     Pulses:          Dorsalis pedis pulses are 2+ on the right side and 2+ on the left side.  Pulmonary:     Effort: Pulmonary effort is normal.  Skin:    General: Skin is warm and dry.     Capillary Refill: Capillary refill takes less than 2 seconds.     Comments: Good distal cap refill. LLE is not dusky in appearance or cool to touch.  Left third toe has a small area of skin abrasion noted to the distal tip.  No deep laceration.  Fourth toe on the left foot has a small skin abrasion.  Oozing blood.   Neurological:     Mental Status: She is alert.  Psychiatric:        Speech: Speech normal.        Behavior: Behavior normal.     ED Results / Procedures / Treatments   Labs (all labs ordered are listed, but only abnormal results are displayed) Labs Reviewed - No data to display  EKG None  Radiology No results found.  Procedures Procedures   Medications Ordered in ED Medications  Tdap (BOOSTRIX) injection 0.5 mL (0.5 mLs Intramuscular Patient Refused/Not Given 06/20/20 2010)    ED Course  I have reviewed the triage vital signs and the nursing notes.  Pertinent labs &  imaging results that were available during my care of the patient were reviewed by me and considered in my medical decision making (see chart for details).    MDM Rules/Calculators/A&P                          70 year old female who presents for evaluations of wounds to her left third and fourth digit.  She reports she was clipping her toenails last night and accidentally cut pieces of the skin.  She reports that since then they have been bleeding.  She wrapped last night and reports when she woke up this morning, they had soaked through the bandages.  She does not know when her last tetanus shot was.  On initial arrival, she is afebrile, toxic appearing.  Vital signs are stable.  On exam, she has 2 small skin abrasions noted to the distal tip of the left third and fourth digit.  No deep laceration that would require suturing.  They are oozing but not actively/briskly bleeding.  Will update tetanus.  No negation for wound repair.  We will plan to redress with combat gauze and reassess.  Reevaluation.  Patient has been observed in the ED.  She has not bled through the bandages.  At this time, patient stable for discharge.  I instructed patient to keep the gauze on until tomorrow.  Patient instructed to follow-up with primary care doctor and return emergency department any worsening  symptoms. At this time, patient exhibits no emergent life-threatening condition that require further evaluation in ED. Patient had ample opportunity for questions and discussion. All patient's questions were answered with full understanding. Strict return precautions discussed. Patient expresses understanding and agreement to plan.    Portions of this note were generated with [employer]. Dictation errors may occur despite best attempts at proofreading.   Final Clinical Impression(s) / ED Diagnoses Final diagnoses:  Skin abrasion    Rx / DC Orders ED Discharge Orders    None       Desma Mcgregor 06/20/20 2318    Noemi Chapel, MD 06/21/20 1757

## 2020-06-20 NOTE — Discharge Instructions (Signed)
Keep the area wrapped until tomorrow afternoon.  At this time, he can take the gauze off.  Keep the wounds clean.  Follow-up with your primary care doctor.  Return to the emergency department for any worsening pain, bleeding or any other worsening concerning symptoms.

## 2020-06-20 NOTE — ED Notes (Signed)
Per PA, quick clot dressing applied to tips of toes were skin was cut. Dry gauze and dressing reapplied to foot.

## 2020-06-20 NOTE — ED Triage Notes (Signed)
Yesterday pt clipped toenails. Third toe left foot has been bleeding intermittently since yesterday. [facility] staff concerned since pt takes blood thinners. Foot arrives in bandage with bleeding controlled. Pt denies pain.

## 2020-06-20 NOTE — ED Notes (Signed)
Bleeding remains controlled. Attempting to reach nurse staff at [facility] for care report.

## 2020-06-22 ENCOUNTER — Other Ambulatory Visit: Payer: Self-pay

## 2020-06-22 ENCOUNTER — Encounter (HOSPITAL_COMMUNITY): Payer: Self-pay

## 2020-06-22 ENCOUNTER — Emergency Department (HOSPITAL_COMMUNITY): Payer: Medicare (Managed Care)

## 2020-06-22 ENCOUNTER — Emergency Department (HOSPITAL_COMMUNITY)
Admission: EM | Admit: 2020-06-22 | Discharge: 2020-06-22 | Disposition: A | Discharge status: Home / Self Care | Payer: Medicare (Managed Care) | Attending: Emergency Medicine | Admitting: Emergency Medicine

## 2020-06-22 DIAGNOSIS — I10 Essential (primary) hypertension: Secondary | ICD-10-CM | POA: Diagnosis not present

## 2020-06-22 DIAGNOSIS — Z7901 Long term (current) use of anticoagulants: Secondary | ICD-10-CM | POA: Insufficient documentation

## 2020-06-22 DIAGNOSIS — Z85528 Personal history of other malignant neoplasm of kidney: Secondary | ICD-10-CM | POA: Insufficient documentation

## 2020-06-22 DIAGNOSIS — R4182 Altered mental status, unspecified: Secondary | ICD-10-CM | POA: Diagnosis not present

## 2020-06-22 DIAGNOSIS — Z79899 Other long term (current) drug therapy: Secondary | ICD-10-CM | POA: Insufficient documentation

## 2020-06-22 DIAGNOSIS — Z87891 Personal history of nicotine dependence: Secondary | ICD-10-CM | POA: Diagnosis not present

## 2020-06-22 DIAGNOSIS — N390 Urinary tract infection, site not specified: Secondary | ICD-10-CM

## 2020-06-22 DIAGNOSIS — R531 Weakness: Secondary | ICD-10-CM | POA: Diagnosis present

## 2020-06-22 DIAGNOSIS — R410 Disorientation, unspecified: Secondary | ICD-10-CM

## 2020-06-22 DIAGNOSIS — J45909 Unspecified asthma, uncomplicated: Secondary | ICD-10-CM | POA: Insufficient documentation

## 2020-06-22 LAB — COMPREHENSIVE METABOLIC PANEL
ALT: 24 U/L (ref 0–44)
AST: 28 U/L (ref 15–41)
Albumin: 3.2 g/dL — ABNORMAL LOW (ref 3.5–5.0)
Alkaline Phosphatase: 132 U/L — ABNORMAL HIGH (ref 38–126)
Anion gap: 7 (ref 5–15)
BUN: 20 mg/dL (ref 8–23)
CO2: 33 mmol/L — ABNORMAL HIGH (ref 22–32)
Calcium: 9.3 mg/dL (ref 8.9–10.3)
Chloride: 92 mmol/L — ABNORMAL LOW (ref 98–111)
Creatinine, Ser: 1.34 mg/dL — ABNORMAL HIGH (ref 0.44–1.00)
GFR, Estimated: 42 mL/min — ABNORMAL LOW (ref >60)
Glucose, Bld: 132 mg/dL — ABNORMAL HIGH (ref 70–99)
Potassium: 4.7 mmol/L (ref 3.5–5.1)
Sodium: 132 mmol/L — ABNORMAL LOW (ref 135–145)
Total Bilirubin: 0.9 mg/dL (ref 0.3–1.2)
Total Protein: 6.2 g/dL — ABNORMAL LOW (ref 6.5–8.1)

## 2020-06-22 LAB — URINALYSIS, ROUTINE W REFLEX MICROSCOPIC
Bacteria, UA: NONE SEEN
Bilirubin Urine: NEGATIVE
Glucose, UA: NEGATIVE mg/dL
Hgb urine dipstick: NEGATIVE
Ketones, ur: NEGATIVE mg/dL
Nitrite: POSITIVE — AB
Protein, ur: NEGATIVE mg/dL
Specific Gravity, Urine: 1.006 (ref 1.005–1.030)
pH: 8 (ref 5.0–8.0)

## 2020-06-22 LAB — CBC
HCT: 37.6 % (ref 36.0–46.0)
Hemoglobin: 12.3 g/dL (ref 12.0–15.0)
MCH: 30.4 pg (ref 26.0–34.0)
MCHC: 32.7 g/dL (ref 30.0–36.0)
MCV: 93.1 fL (ref 80.0–100.0)
Platelets: 172 10*3/uL (ref 150–400)
RBC: 4.04 MIL/uL (ref 3.87–5.11)
RDW: 13.3 % (ref 11.5–15.5)
WBC: 6.7 10*3/uL (ref 4.0–10.5)
nRBC: 0 % (ref 0.0–0.2)

## 2020-06-22 LAB — DIGOXIN LEVEL: Digoxin Level: 1.9 ng/mL (ref 0.8–2.0)

## 2020-06-22 LAB — AMMONIA: Ammonia: <9 umol/L — ABNORMAL LOW (ref 9–35)

## 2020-06-22 MED ORDER — FOSFOMYCIN TROMETHAMINE 3 G PO PACK
3.0000 g | PACK | Freq: Once | ORAL | Status: AC
  Administered 2020-06-22: 3 g via ORAL
  Filled 2020-06-22: qty 3

## 2020-06-22 NOTE — ED Triage Notes (Signed)
Pt sent to ED from [facility] for [place]. Per staff, pt has not took her lactulose since Friday and has became more altered since. Pt alert and oriented x 4.

## 2020-06-22 NOTE — ED Provider Notes (Signed)
[facility] EMERGENCY DEPARTMENT Provider Note   CSN: [id number] Arrival date & time: 06/22/20  1249     History Chief Complaint  Patient presents with  . Altered Mental Status    Nicole [name] is a 70 y.o. female.  Patient with hx etoh-liver disease (no current/recent etoh use), presents from ECF with general weakness, not acting like herself/decreased energy level in past few days. Symptoms gradual onset, constant, persistent, moderate.  Had recently sstopped taking her lactulose as she indicates her stools were loose anyway. Denies vomiting. Denies abd pain. Pt denies headache. Denies trauma/fall. No cough or uri symptoms. No chest pain or sob. No abd pain or nausea/vomiting. States is eating/drinking. No dysuria or gu c/o. Arrives via EMS, is alert, oriented.   The history is provided by the patient, medical records and the nursing home.  Altered Mental Status Associated symptoms: no abdominal pain, no agitation, no fever, no headaches, no rash and no vomiting        Past Medical History:  Diagnosis Date  . Anemia   . Arthritis   . Asthma    as a child  . Atrial fibrillation with RVR ([place])   . Cancer ([place])    right kidney  . Chronic alcoholism ([place])   . Chronic left shoulder pain   . Dysrhythmia    Atrial Fib with RVR   . GERD (gastroesophageal reflux disease)   . Heartburn   . Hiatal hernia 12/17/2016   stomach noted in chest on CT scan  . History of blood transfusion   . Hypertension   . Memory loss   . Schizophrenia ([place])   . Seizures ([place])     had when she had period and when she ovalate  . Thyroid nodule     Patient Active Problem List   Diagnosis Date Noted  . Increased ammonia level   . Palliative care by specialist   . Goals of care, counseling/discussion   . AKI (acute kidney injury) ([place])   . Hepatic encephalopathy ([place]) 11/01/2019  . Hypotension 11/01/2019  . Jaundice 11/01/2019  . Hyperbilirubinemia 11/01/2019  . Hyperammonemia ([place])  11/01/2019  . Alcoholic hepatitis 77/82/4235  . Acute liver failure 10/31/2019  . Acute respiratory failure with hypoxia ([place])   . Epigastric abdominal tenderness 12/18/2016  . Gastric mass 12/17/2016    Class: Acute  . Right renal mass 12/17/2016    Class: Acute  . Diverticulosis of sigmoid colon 12/17/2016  . Hiatal hernia 12/17/2016    Class: Acute  . Chronic alcoholism ([place]) 12/17/2016    Class: Chronic  . Chronic left shoulder pain 12/17/2016    Class: Chronic  . Arthritis 12/17/2016    Class: Chronic  . Hyponatremia 12/17/2016    Class: Acute  . Hypokalemia 12/17/2016    Class: Acute  . Anemia 12/17/2016  . Atrial fibrillation with rapid ventricular response ([place]) 12/17/2016    Class: Acute    Past Surgical History:  Procedure Laterality Date  . ANKLE FRACTURE SURGERY Right 2008   screws  . ANKLE FRACTURE SURGERY Left 2008   plates  . ESOPHAGOGASTRODUODENOSCOPY (EGD) WITH PROPOFOL N/A 12/18/2016   Procedure: ESOPHAGOGASTRODUODENOSCOPY (EGD) WITH PROPOFOL;  Surgeon: Wilford Corner, MD;  Location: [facility];  Service: Endoscopy;  Laterality: N/A;  . NASAL SINUS SURGERY Left 11/17/2017   Procedure: ENDOSCOPIC SINUS approach;  Surgeon: Melida Quitter, MD;  Location: [facility];  Service: ENT;  Laterality: Left;  . NEPHRECTOMY Right    robotic riight radical   .  ORIF ORBITAL FRACTURE Left 11/17/2017   Procedure: Trans Antral Left sided orbital floor fracture repair with dissovable floor implant;  Surgeon: Melida Quitter, MD;  Location: [facility];  Service: ENT;  Laterality: Left;     OB History   No obstetric history on file.     Family History  Problem Relation Age of Onset  . Stroke Mother   . Heart attack Father   . Ulcers Father   . Diabetes Sister   . Cancer Brother   . Diabetes Sister   . Diabetes Sister     Social History   Tobacco Use  . Smoking status: Former Smoker    Years: 15.00    Types: Cigarettes    Quit date: 11/14/2007    Years since  quitting: 12.6  . Smokeless tobacco: Never Used  Vaping Use  . Vaping Use: Never used  Substance Use Topics  . Alcohol use: Not Currently    Comment: none since fall 11/13/17  . Drug use: No    Home Medications Prior to Admission medications   Medication Sig Start Date End Date Taking? Authorizing Provider  cefdinir (OMNICEF) 300 MG capsule Take 1 capsule (300 mg total) by mouth every 12 (twelve) hours. X 4 days 11/09/19   Orson Eva, MD  diclofenac Sodium (VOLTAREN) 1 % GEL  12/17/19   [provider]  digoxin (LANOXIN) 0.125 MG tablet Take by mouth. 12/27/19 12/26/20  [provider]  ferrous sulfate 325 (65 FE) MG tablet Take 325 mg by mouth daily with breakfast.     [provider]  folic acid (FOLVITE) 1 MG tablet Take 1 tablet (1 mg total) by mouth daily. 11/09/19   Orson Eva, MD  lactulose (CHRONULAC) 10 GM/15ML solution Take 15 mLs (10 g total) by mouth 2 (two) times daily. 11/09/19   Orson Eva, MD  Magnesium 250 MG TABS Take 2 tablets by mouth daily.    [provider]  metoprolol tartrate (LOPRESSOR) 50 MG tablet Take 50 mg by mouth 2 (two) times daily.    [provider]  Multiple Vitamins-Minerals (MULTIVITAMIN WITH MINERALS) tablet Take 1 tablet by mouth daily.    [provider]  omeprazole (PRILOSEC) 20 MG capsule Take 20 mg by mouth daily.    [provider]  potassium chloride SA (KLOR-CON) 20 MEQ tablet Take by mouth. 12/26/19 01/10/20  [provider]  predniSONE (DELTASONE) 10 MG tablet Take 3 tablets (30 mg total) by mouth daily. X 7 days; then 2 tabs (20 mg) daily x 7 days; then 1 tab (10 mg) daily x 7 days; then 1/2 tab (5 mg) daily x 7 days 11/09/19   Tat, Shanon Brow, MD  Propylene Glycol (SYSTANE COMPLETE) 0.6 % SOLN Place 1 drop into both eyes See admin instructions. Instill one drop into both eyes every morning, may also use later in the day as needed for dry eyes    [provider]  rifaximin  (XIFAXAN) 550 MG TABS tablet Take 1 tablet (550 mg total) by mouth 2 (two) times daily. 11/09/19   Orson Eva, MD  RIVAROXABAN Alveda Reasons) VTE STARTER PACK (15 & 20 MG) Follow package directions: Take one 15mg  tablet by mouth twice a day. On day 22, switch to one 20mg  tablet once a day. Take with food. 01/06/20   Fransico Meadow, PA-C  thiamine 100 MG tablet Take 1 tablet (100 mg total) by mouth daily. 11/09/19   Orson Eva, MD    Allergies  Other, Shellfish allergy, and Iodinated diagnostic agents  Review of Systems   Review of Systems  Constitutional: Negative for chills and fever.  HENT: Negative for sore throat.   Eyes: Negative for visual disturbance.  Respiratory: Negative for cough and shortness of breath.   Cardiovascular: Negative for chest pain.  Gastrointestinal: Negative for abdominal pain and vomiting.  Genitourinary: Negative for dysuria and flank pain.  Musculoskeletal: Negative for back pain and neck pain.  Skin: Negative for rash.  Neurological: Negative for headaches.  Hematological: Does not bruise/bleed easily.  Psychiatric/Behavioral: Negative for agitation.    Physical Exam Updated Vital Signs BP 107/78 (BP Location: Right Arm)   Pulse 61   Temp 98 F (36.7 C) (Oral)   Resp 16   Ht 1.753 m (5\' 9" )   Wt 60.8 kg   SpO2 96%   BMI 19.79 kg/m   Physical Exam Vitals and nursing note reviewed.  Constitutional:      Appearance: Normal appearance. She is well-developed.  HENT:     Head: Atraumatic.     Nose: Nose normal.     Mouth/Throat:     Mouth: Mucous membranes are moist.  Eyes:     General: No scleral icterus.    Conjunctiva/sclera: Conjunctivae normal.     Pupils: Pupils are equal, round, and reactive to light.  Neck:     Vascular: No carotid bruit.     Trachea: No tracheal deviation.     Comments: No stiffness or rigidity.  Cardiovascular:     Rate and Rhythm: Normal rate and regular rhythm.     Pulses: Normal pulses.     Heart sounds:  Normal heart sounds. No murmur heard. No friction rub. No gallop.   Pulmonary:     Effort: Pulmonary effort is normal. No respiratory distress.     Breath sounds: Normal breath sounds.  Abdominal:     General: Bowel sounds are normal. There is no distension.     Palpations: Abdomen is soft.     Tenderness: There is no abdominal tenderness. There is no guarding.  Genitourinary:    Comments: No cva tenderness.  Musculoskeletal:        General: No swelling or tenderness.     Cervical back: Normal range of motion and neck supple. No rigidity. No muscular tenderness.  Skin:    General: Skin is warm and dry.     Findings: No rash.  Neurological:     Mental Status: She is alert.     Comments: Alert, speech normal. Oriented x 4. Motor/sens grossly intact bil.   Psychiatric:        Mood and Affect: Mood normal.     ED Results / Procedures / Treatments   Labs (all labs ordered are listed, but only abnormal results are displayed) Results for orders placed or performed during the hospital encounter of 06/22/20  CBC  Result Value Ref Range   WBC 6.7 4.0 - 10.5 K/uL   RBC 4.04 3.87 - 5.11 MIL/uL   Hemoglobin 12.3 12.0 - 15.0 g/dL   HCT 37.6 36.0 - 46.0 %   MCV 93.1 80.0 - 100.0 fL   MCH 30.4 26.0 - 34.0 pg   MCHC 32.7 30.0 - 36.0 g/dL   RDW 13.3 11.5 - 15.5 %   Platelets 172 150 - 400 K/uL   nRBC 0.0 0.0 - 0.2 %  Comprehensive metabolic panel  Result Value Ref Range   Sodium 132 (L) 135 - 145 mmol/L   Potassium  4.7 3.5 - 5.1 mmol/L   Chloride 92 (L) 98 - 111 mmol/L   CO2 33 (H) 22 - 32 mmol/L   Glucose, Bld 132 (H) 70 - 99 mg/dL   BUN 20 8 - 23 mg/dL   Creatinine, Ser 1.34 (H) 0.44 - 1.00 mg/dL   Calcium 9.3 8.9 - 10.3 mg/dL   Total Protein 6.2 (L) 6.5 - 8.1 g/dL   Albumin 3.2 (L) 3.5 - 5.0 g/dL   AST 28 15 - 41 U/L   ALT 24 0 - 44 U/L   Alkaline Phosphatase 132 (H) 38 - 126 U/L   Total Bilirubin 0.9 0.3 - 1.2 mg/dL   GFR, Estimated 42 (L) >60 mL/min   Anion gap 7 5 - 15   Ammonia  Result Value Ref Range   Ammonia <9 (L) 9 - 35 umol/L    EKG EKG Interpretation  Date/Time:  Monday Jun 22 2020 13:09:04 EDT Ventricular Rate:  84 PR Interval:    QRS Duration: 100 QT Interval:  361 QTC Calculation: 390 R Axis:   96 Text Interpretation: Atrial fibrillation Right axis deviation Low voltage, extremity and precordial leads Non-specific ST-t changes Baseline wander Confirmed by Lajean Saver [phone]) on 06/22/2020 1:45:01 PM   Radiology CT HEAD WO CONTRAST  Result Date: 06/22/2020 CLINICAL DATA:  Mental status change. EXAM: CT HEAD WITHOUT CONTRAST TECHNIQUE: Contiguous axial images were obtained from the base of the skull through the vertex without intravenous contrast. COMPARISON:  None. FINDINGS: Brain: Moderate cerebral atrophy most notable in the frontal lobes. The ventricles are in the midline without mass effect or shift. No extra-axial fluid collections are identified. No CT findings for acute hemispheric infarction or intracranial hemorrhage. No mass lesions. Brainstem and cerebellum are grossly normal. Vascular: No aneurysm or hyperdense vessels. Scattered arterial calcifications are noted. Skull: No skull fracture or bone lesions. Sinuses/Orbits: The paranasal sinuses and mastoid air cells are clear. The globes are intact. Other: No scalp lesions or scalp hematoma. IMPRESSION: 1. Moderate cerebral atrophy most notable in the frontal lobes. 2. No acute intracranial findings or mass lesions. Electronically Signed   By: Marijo Sanes M.D.   On: 06/22/2020 14:35    Procedures Procedures   Medications Ordered in ED Medications - No data to display  ED Course  I have reviewed the triage vital signs and the nursing notes.  Pertinent labs & imaging results that were available during my care of the patient were reviewed by me and considered in my medical decision making (see chart for details).    MDM Rules/Calculators/A&P                          Iv ns.  Labs sent. Imaging ordered.  Reviewed nursing notes and prior charts for additional history.   Labs reviewed/interpreted by me - wbc normal. Ammonia normal.   CT reviewed/niterpreted by me - no hem.   Po fluids, food.   Patient remains alert, oriented. Denies pain, sob, fever/chills or other acute complaint.   1530 UA, dig level, labs pending - signed out to Dr Vanita Panda to check pending labs, and dispo appropriately.  If all labs ok, no new symptoms, feel likely stable for d/c w pcp f/u then.       Final Clinical Impression(s) / ED Diagnoses Final diagnoses:  None    Rx / DC Orders ED Discharge Orders    None       Lajean Saver, MD 06/22/20  1531  

## 2020-06-22 NOTE — ED Notes (Signed)
Dr. Steinl at bedside 

## 2020-06-22 NOTE — ED Provider Notes (Signed)
Dig level unremarkable Urinalysis consistent with infection Vital signs remain unremarkable, patient appropriate for discharge back to her facility after antibiotics.   Carmin Muskrat, MD 06/22/20 [phone]

## 2020-06-22 NOTE — Discharge Instructions (Addendum)
You have received a dose of antibiotics for a urinary tract infection.  Your tests are otherwise reassuring.  Follow-up with your physician or return here for concerning changes in your condition.

## 2020-10-06 ENCOUNTER — Encounter: Payer: Self-pay | Admitting: Internal Medicine

## 2020-10-16 DIAGNOSIS — I2510 Atherosclerotic heart disease of native coronary artery without angina pectoris: Secondary | ICD-10-CM | POA: Insufficient documentation

## 2020-10-16 DIAGNOSIS — G40909 Epilepsy, unspecified, not intractable, without status epilepticus: Secondary | ICD-10-CM | POA: Insufficient documentation

## 2020-10-16 DIAGNOSIS — E559 Vitamin D deficiency, unspecified: Secondary | ICD-10-CM | POA: Insufficient documentation

## 2020-10-16 DIAGNOSIS — K7460 Unspecified cirrhosis of liver: Secondary | ICD-10-CM | POA: Insufficient documentation

## 2020-10-16 DIAGNOSIS — G894 Chronic pain syndrome: Secondary | ICD-10-CM | POA: Insufficient documentation

## 2020-10-16 DIAGNOSIS — I509 Heart failure, unspecified: Secondary | ICD-10-CM | POA: Insufficient documentation

## 2020-10-16 DIAGNOSIS — I5032 Chronic diastolic (congestive) heart failure: Secondary | ICD-10-CM | POA: Insufficient documentation

## 2020-10-16 DIAGNOSIS — K219 Gastro-esophageal reflux disease without esophagitis: Secondary | ICD-10-CM | POA: Insufficient documentation

## 2020-11-10 ENCOUNTER — Other Ambulatory Visit (HOSPITAL_COMMUNITY): Payer: Self-pay | Admitting: Neurology

## 2020-11-10 DIAGNOSIS — M25551 Pain in right hip: Secondary | ICD-10-CM

## 2021-02-20 NOTE — Progress Notes (Deleted)
Referring Provider: Dr. Sherrie Sport  Primary Care Physician:  Caprice Renshaw, MD Primary Gastroenterologist:  Dr. Gala Romney  No chief complaint on file.   HPI:   Nicole [name] is a 71 y.o. female presenting today at the request of Dr. Sherrie Sport for consult colonoscopy.   She has history of thyroid nodule, atrial fibrillation on Eliquis, anemia of chronic disease, renal cell carcinoma s/p right nephrectomy, seizure disorder, HTN, schizophrenia, GERD with known large hiatal hernia, alcohol abuse, alcoholic hepatitis, concerns for underlying cirrhosis on CT in 2021, abnormal pancreas on CT in 2021 without follow-up imaging on file, prior pancreatic pseudocyst presenting as a gastric mass on EGD (EUS in November 2018 at [facility] with CEA 71 and amylase 3345).   Today:      Past Medical History:  Diagnosis Date   Anemia    Arthritis    Asthma    as a child   Atrial fibrillation with RVR ([place])    Cancer ([place])    right kidney   Chronic alcoholism ([place])    Chronic left shoulder pain    Dysrhythmia    Atrial Fib with RVR    GERD (gastroesophageal reflux disease)    Heartburn    Hiatal hernia 12/17/2016   stomach noted in chest on CT scan   History of blood transfusion    Hypertension    Memory loss    Schizophrenia ([place])    Seizures ([place])     had when she had period and when she ovalate   Thyroid nodule     Past Surgical History:  Procedure Laterality Date   ANKLE FRACTURE SURGERY Right 2008   screws   ANKLE FRACTURE SURGERY Left 2008   plates   ESOPHAGOGASTRODUODENOSCOPY (EGD) WITH PROPOFOL N/A 12/18/2016   Procedure: ESOPHAGOGASTRODUODENOSCOPY (EGD) WITH PROPOFOL;  Surgeon: Wilford Corner, MD;  Location: [place];  Service: Endoscopy;  Laterality: N/A;   NASAL SINUS SURGERY Left 11/17/2017   Procedure: ENDOSCOPIC SINUS approach;  Surgeon: Melida Quitter, MD;  Location: [place];  Service: ENT;  Laterality: Left;   NEPHRECTOMY Right    robotic riight radical    ORIF ORBITAL  FRACTURE Left 11/17/2017   Procedure: Trans Antral Left sided orbital floor fracture repair with dissovable floor implant;  Surgeon: Melida Quitter, MD;  Location: [facility] OR;  Service: ENT;  Laterality: Left;    Current Outpatient Medications  Medication Sig Dispense Refill   cefdinir (OMNICEF) 300 MG capsule Take 1 capsule (300 mg total) by mouth every 12 (twelve) hours. X 4 days 8 capsule 0   diclofenac Sodium (VOLTAREN) 1 % GEL      digoxin (LANOXIN) 0.125 MG tablet Take by mouth.     ferrous sulfate 325 (65 FE) MG tablet Take 325 mg by mouth daily with breakfast.      folic acid (FOLVITE) 1 MG tablet Take 1 tablet (1 mg total) by mouth daily.     lactulose (CHRONULAC) 10 GM/15ML solution Take 15 mLs (10 g total) by mouth 2 (two) times daily. 236 mL 0   Magnesium 250 MG TABS Take 2 tablets by mouth daily.     metoprolol tartrate (LOPRESSOR) 50 MG tablet Take 50 mg by mouth 2 (two) times daily.     Multiple Vitamins-Minerals (MULTIVITAMIN WITH MINERALS) tablet Take 1 tablet by mouth daily.     omeprazole (PRILOSEC) 20 MG capsule Take 20 mg by mouth daily.     potassium chloride SA (KLOR-CON) 20 MEQ tablet Take by mouth.  predniSONE (DELTASONE) 10 MG tablet Take 3 tablets (30 mg total) by mouth daily. X 7 days; then 2 tabs (20 mg) daily x 7 days; then 1 tab (10 mg) daily x 7 days; then 1/2 tab (5 mg) daily x 7 days 90 tablet 1   Propylene Glycol (SYSTANE COMPLETE) 0.6 % SOLN Place 1 drop into both eyes See admin instructions. Instill one drop into both eyes every morning, may also use later in the day as needed for dry eyes     rifaximin (XIFAXAN) 550 MG TABS tablet Take 1 tablet (550 mg total) by mouth 2 (two) times daily. 60 tablet 1   RIVAROXABAN (XARELTO) VTE STARTER PACK (15 & 20 MG) Follow package directions: Take one 15mg  tablet by mouth twice a day. On day 22, switch to one 20mg  tablet once a day. Take with food. 51 each 0   thiamine 100 MG tablet Take 1 tablet (100 mg total) by mouth  daily.     No current facility-administered medications for this visit.    Allergies as of 02/22/2021 - Review Complete 06/22/2020  Allergen Reaction Noted   Other Shortness Of Breath and Anxiety 12/16/2016   Shellfish allergy Shortness Of Breath 12/16/2016   Iodinated contrast media Swelling 12/16/2016    Family History  Problem Relation Age of Onset   Stroke Mother    Heart attack Father    Ulcers Father    Diabetes Sister    Cancer Brother    Diabetes Sister    Diabetes Sister     Social History   Socioeconomic History   Marital status: Divorced    Spouse name: Not on file   Number of children: Not on file   Years of education: Not on file   Highest education level: Not on file  Occupational History   Not on file  Tobacco Use   Smoking status: Former    Years: 15.00    Types: Cigarettes    Quit date: 11/14/2007    Years since quitting: 13.2   Smokeless tobacco: Never  Vaping Use   Vaping Use: Never used  Substance and Sexual Activity   Alcohol use: Not Currently    Comment: none since fall 11/13/17   Drug use: No   Sexual activity: Not Currently  Other Topics Concern   Not on file  Social History Narrative   quit drinking in late August but went on a binge over last weekend (october 26, 26, 28).  Hasn't drank since Sunday (6 days ago) due to abd pain, N, V   Social Determinants of Health   Financial Resource Strain: Not on file  Food Insecurity: Not on file  Transportation Needs: Not on file  Physical Activity: Not on file  Stress: Not on file  Social Connections: Not on file  Intimate Partner Violence: Not on file    Review of Systems: Gen: Denies any fever, chills, fatigue, weight loss, lack of appetite.  CV: Denies chest pain, heart palpitations, peripheral edema, syncope.  Resp: Denies shortness of breath at rest or with exertion. Denies wheezing or cough.  GI: Denies dysphagia or odynophagia. Denies jaundice, hematemesis, fecal  incontinence. GU : Denies urinary burning, urinary frequency, urinary hesitancy MS: Denies joint pain, muscle weakness, cramps, or limitation of movement.  Derm: Denies rash, itching, dry skin Psych: Denies depression, anxiety, memory loss, and confusion Heme: Denies bruising, bleeding, and enlarged lymph nodes.  Physical Exam: There were no vitals taken for this visit. General:   Alert  and oriented. Pleasant and cooperative. Well-nourished and well-developed.  Head:  Normocephalic and atraumatic. Eyes:  Without icterus, sclera clear and conjunctiva pink.  Ears:  Normal auditory acuity. Nose:  No deformity, discharge,  or lesions. Mouth:  No deformity or lesions, oral mucosa pink.  Neck:  Supple, without mass or thyromegaly. Lungs:  Clear to auscultation bilaterally. No wheezes, rales, or rhonchi. No distress.  Heart:  S1, S2 present without murmurs appreciated.  Abdomen:  +BS, soft, non-tender and non-distended. No HSM noted. No guarding or rebound. No masses appreciated.  Rectal:  Deferred  Msk:  Symmetrical without gross deformities. Normal posture. Pulses:  Normal pulses noted. Extremities:  Without clubbing or edema. Neurologic:  Alert and  oriented x4;  grossly normal neurologically. Skin:  Intact without significant lesions or rashes. Cervical Nodes:  No significant cervical adenopathy. Psych:  Alert and cooperative. Normal mood and affect.

## 2021-02-22 ENCOUNTER — Ambulatory Visit: Payer: Medicare (Managed Care) | Admitting: Gastroenterology

## 2021-02-22 ENCOUNTER — Encounter: Payer: Self-pay | Admitting: Gastroenterology

## 2021-04-13 DIAGNOSIS — M810 Age-related osteoporosis without current pathological fracture: Secondary | ICD-10-CM | POA: Diagnosis not present

## 2021-04-19 DIAGNOSIS — E119 Type 2 diabetes mellitus without complications: Secondary | ICD-10-CM | POA: Diagnosis not present

## 2021-04-19 DIAGNOSIS — K7460 Unspecified cirrhosis of liver: Secondary | ICD-10-CM | POA: Diagnosis not present

## 2021-04-19 DIAGNOSIS — K219 Gastro-esophageal reflux disease without esophagitis: Secondary | ICD-10-CM | POA: Diagnosis not present

## 2021-04-19 DIAGNOSIS — I10 Essential (primary) hypertension: Secondary | ICD-10-CM | POA: Diagnosis not present

## 2021-04-19 DIAGNOSIS — I5032 Chronic diastolic (congestive) heart failure: Secondary | ICD-10-CM | POA: Diagnosis not present

## 2021-04-19 DIAGNOSIS — I82220 Acute embolism and thrombosis of inferior vena cava: Secondary | ICD-10-CM | POA: Diagnosis not present

## 2021-07-20 DIAGNOSIS — I5032 Chronic diastolic (congestive) heart failure: Secondary | ICD-10-CM | POA: Diagnosis not present

## 2021-07-20 DIAGNOSIS — K219 Gastro-esophageal reflux disease without esophagitis: Secondary | ICD-10-CM | POA: Diagnosis not present

## 2021-07-20 DIAGNOSIS — I10 Essential (primary) hypertension: Secondary | ICD-10-CM | POA: Diagnosis not present

## 2021-07-20 DIAGNOSIS — E1169 Type 2 diabetes mellitus with other specified complication: Secondary | ICD-10-CM | POA: Diagnosis not present

## 2021-07-20 DIAGNOSIS — K7460 Unspecified cirrhosis of liver: Secondary | ICD-10-CM | POA: Diagnosis not present

## 2021-07-20 DIAGNOSIS — Z0000 Encounter for general adult medical examination without abnormal findings: Secondary | ICD-10-CM | POA: Diagnosis not present

## 2021-12-08 DIAGNOSIS — E1169 Type 2 diabetes mellitus with other specified complication: Secondary | ICD-10-CM | POA: Diagnosis not present

## 2021-12-08 DIAGNOSIS — I5032 Chronic diastolic (congestive) heart failure: Secondary | ICD-10-CM | POA: Diagnosis not present

## 2021-12-08 DIAGNOSIS — K7460 Unspecified cirrhosis of liver: Secondary | ICD-10-CM | POA: Diagnosis not present

## 2021-12-08 DIAGNOSIS — Z0000 Encounter for general adult medical examination without abnormal findings: Secondary | ICD-10-CM | POA: Diagnosis not present

## 2021-12-08 DIAGNOSIS — I10 Essential (primary) hypertension: Secondary | ICD-10-CM | POA: Diagnosis not present

## 2021-12-08 DIAGNOSIS — K219 Gastro-esophageal reflux disease without esophagitis: Secondary | ICD-10-CM | POA: Diagnosis not present

## 2021-12-23 ENCOUNTER — Encounter: Payer: Self-pay | Admitting: *Deleted

## 2021-12-31 DIAGNOSIS — M25552 Pain in left hip: Secondary | ICD-10-CM | POA: Diagnosis not present

## 2022-02-03 ENCOUNTER — Encounter: Payer: Self-pay | Admitting: Orthopaedic Surgery

## 2022-02-03 ENCOUNTER — Ambulatory Visit (INDEPENDENT_AMBULATORY_CARE_PROVIDER_SITE_OTHER): Payer: Medicare Other | Admitting: Orthopaedic Surgery

## 2022-02-03 VITALS — Ht 68.0 in | Wt 160.0 lb

## 2022-02-03 DIAGNOSIS — M87052 Idiopathic aseptic necrosis of left femur: Secondary | ICD-10-CM

## 2022-02-03 DIAGNOSIS — M1612 Unilateral primary osteoarthritis, left hip: Secondary | ICD-10-CM | POA: Diagnosis not present

## 2022-02-03 NOTE — Progress Notes (Signed)
Office Visit Note   Patient: Nicole [name]           Date of Birth: [birth year]           MRN: [id number] Visit Date: 02/03/2022              Requested by: Caprice Renshaw, MD [address] [place],   [id number] PCP: Neale Burly, MD   Assessment & Plan: Visit Diagnoses:  1. Avascular necrosis of bone of left hip ([place])   2. Arthritis of left hip     Plan: We discussed with her severe AVN head involvement collapse of the head and secondary osteoarthritis of the hip she would require total hip arthroplasty.  Physical therapy will not be beneficial.  She has been in a wheelchair and minimally ambulator in the house with a walker.  She likely would require SNF placement after surgery for additional rehabilitation.  She will need medical clearance with her past history of hepatic and renal problems.  We discussed cutting back on her smoking to help with pulmonary problems.  Hip arthroplasty would be done under spinal anesthesia with postop Marcaine Exparel in the incision physical therapy.  She is at risk for IntraOp bone fracture with poor bone quality related to past liver problems.  We discussed needing preoperative lab work including CMET to make sure her albumin is at least 3.5 so she could have appropriate healing.  Surgery discussed she can see her PCP Dr.Hasanaj for some screening lab work CBC c-Met.  When she is medically cleared we can proceed with surgical scheduling for left total of arthroplasty direct anterior approach.  Follow-Up Instructions: No follow-ups on file.   Orders:  No orders of the defined types were placed in this encounter.  No orders of the defined types were placed in this encounter.     Procedures: No procedures performed   Clinical Data: No additional findings.   Subjective: Chief Complaint  Patient presents with   Left Hip - Pain    HPI 71 year old female seen with left hip avascular necrosis.  She has been in a wheelchair for about a year  with severe hip pain.  She can walk in the house using a walker to make it to the bathroom.  She has groin pain.  She only has 1 kidney had nephrectomy 2019 originally robotic converted to open.  Liver is not in good shape she states "she drank her self silly" but is quit.  She is retired Marine scientist.  She had some knee surgery few years ago has had some stiffness in her knee slightly but in sitting position can flex and extend her knee.  Her pain around her knee is in the supracondylar region and occurs with hip range of motion and when she has groin pain.  Past history of hepatic encephalopathy elevated bilirubin elevated ammonia levels, jaundice patient is on digoxin for heart failure.  She is using Tylenol occasionally.  Past history is with a UTI.  Review of Systems all other systems noncontributory to HPI.  Minimal household ambulator with left hip AVN and history of renal mass with nephrectomy.  No current dyspnea.  Present smoker.   Objective: Vital Signs: Ht _0  (1.727 m)   Wt 160 lb (72.6 kg)   BMI 24.33 kg/m   Physical Exam Constitutional:      Appearance: She is well-developed.  HENT:     Head: Normocephalic.     Right Ear: External ear normal.  Left Ear: External ear normal. There is no impacted cerumen.  Eyes:     Pupils: Pupils are equal, round, and reactive to light.  Neck:     Thyroid: No thyromegaly.     Trachea: No tracheal deviation.  Cardiovascular:     Rate and Rhythm: Normal rate.  Pulmonary:     Effort: Pulmonary effort is normal.  Abdominal:     Palpations: Abdomen is soft.  Musculoskeletal:     Cervical back: No rigidity.  Skin:    General: Skin is warm and dry.  Neurological:     Mental Status: She is alert and oriented to person, place, and time.  Psychiatric:        Behavior: Behavior normal.   No jaundice.  Facial scar left side from previous orbital blowout fracture reconstruction.  Ortho Exam patient has severe pain with 10 degrees internal and  20 degrees external rotation left hip.  Opposite right hip has full range of motion.  Knee flexion extension not painful distal pulses palpable no pitting edema.  Specialty Comments:  No specialty comments available.  Imaging: Impression  Severe, asymmetric left femoroacetabular arthrosis with flattening of the left femoral head suggesting avascular necrosis with femoral head collapse.   Electronically Signed   By: Delanna Ahmadi M.D.   On: 01/02/2022 15:19   PMFS History: Patient Active Problem List   Diagnosis Date Noted   Avascular necrosis of bone of left hip ([place]) 02/04/2022   Arthritis of left hip 02/04/2022   Increased ammonia level    Palliative care by specialist    Goals of care, counseling/discussion    AKI (acute kidney injury) ([place])    Hepatic encephalopathy ([place]) 11/01/2019   Hypotension 11/01/2019   Jaundice 11/01/2019   Hyperbilirubinemia 11/01/2019   Hyperammonemia ([place]) 16/08/3708   Alcoholic hepatitis 62/69/4854   Acute liver failure 10/31/2019   Acute respiratory failure with hypoxia ([facility])    Epigastric abdominal tenderness 12/18/2016   Gastric mass 12/17/2016    Class: Acute   Right renal mass 12/17/2016    Class: Acute   Diverticulosis of sigmoid colon 12/17/2016   Hiatal hernia 12/17/2016    Class: Acute   Chronic alcoholism ([place]) 12/17/2016    Class: Chronic   Chronic left shoulder pain 12/17/2016    Class: Chronic   Arthritis 12/17/2016    Class: Chronic   Hyponatremia 12/17/2016    Class: Acute   Hypokalemia 12/17/2016    Class: Acute   Anemia 12/17/2016   Atrial fibrillation with rapid ventricular response ([place]) 12/17/2016    Class: Acute   Past Medical History:  Diagnosis Date   Anemia    Arthritis    Asthma    as a child   Atrial fibrillation with RVR ([place])    Cancer ([place])    right kidney   Chronic alcoholism ([facility])    Chronic left shoulder pain    Dysrhythmia    Atrial Fib with RVR    GERD (gastroesophageal reflux  disease)    Heartburn    Hiatal hernia 12/17/2016   stomach noted in chest on CT scan   History of blood transfusion    Hypertension    Memory loss    Schizophrenia ([place])    Seizures ([place])     had when she had period and when she ovalate   Thyroid nodule     Family History  Problem Relation Age of Onset   Stroke Mother    Heart attack Father  Ulcers Father    Diabetes Sister    Cancer Brother    Diabetes Sister    Diabetes Sister     Past Surgical History:  Procedure Laterality Date   ANKLE FRACTURE SURGERY Right 2008   screws   ANKLE FRACTURE SURGERY Left 2008   plates   ESOPHAGOGASTRODUODENOSCOPY (EGD) WITH PROPOFOL N/A 12/18/2016   Procedure: ESOPHAGOGASTRODUODENOSCOPY (EGD) WITH PROPOFOL;  Surgeon: Wilford Corner, MD;  Location: [facility] ENDOSCOPY;  Service: Endoscopy;  Laterality: N/A;   NASAL SINUS SURGERY Left 11/17/2017   Procedure: ENDOSCOPIC SINUS approach;  Surgeon: Melida Quitter, MD;  Location: [place];  Service: ENT;  Laterality: Left;   NEPHRECTOMY Right    robotic riight radical    ORIF ORBITAL FRACTURE Left 11/17/2017   Procedure: Trans Antral Left sided orbital floor fracture repair with dissovable floor implant;  Surgeon: Melida Quitter, MD;  Location: [place];  Service: ENT;  Laterality: Left;   Social History   Occupational History   Not on file  Tobacco Use   Smoking status: Former    Years: 15.00    Types: Cigarettes    Quit date: 11/14/2007    Years since quitting: 14.2   Smokeless tobacco: Never  Vaping Use   Vaping Use: Never used  Substance and Sexual Activity   Alcohol use: Not Currently    Comment: none since fall 11/13/17   Drug use: No   Sexual activity: Not Currently

## 2022-02-04 DIAGNOSIS — M87052 Idiopathic aseptic necrosis of left femur: Secondary | ICD-10-CM | POA: Insufficient documentation

## 2022-02-04 DIAGNOSIS — M1612 Unilateral primary osteoarthritis, left hip: Secondary | ICD-10-CM | POA: Insufficient documentation

## 2022-02-28 DIAGNOSIS — E1169 Type 2 diabetes mellitus with other specified complication: Secondary | ICD-10-CM | POA: Diagnosis not present

## 2022-02-28 DIAGNOSIS — I5032 Chronic diastolic (congestive) heart failure: Secondary | ICD-10-CM | POA: Diagnosis not present

## 2022-02-28 DIAGNOSIS — I10 Essential (primary) hypertension: Secondary | ICD-10-CM | POA: Diagnosis not present

## 2022-02-28 DIAGNOSIS — K7460 Unspecified cirrhosis of liver: Secondary | ICD-10-CM | POA: Diagnosis not present

## 2022-02-28 DIAGNOSIS — K219 Gastro-esophageal reflux disease without esophagitis: Secondary | ICD-10-CM | POA: Diagnosis not present

## 2022-02-28 DIAGNOSIS — Z0000 Encounter for general adult medical examination without abnormal findings: Secondary | ICD-10-CM | POA: Diagnosis not present

## 2022-03-11 ENCOUNTER — Telehealth: Payer: Self-pay

## 2022-03-11 NOTE — Telephone Encounter (Signed)
FYI - Dr.  Sport would not clear patient for left THA.  He listed issues with liver, heart and kidney.  Patient has been advised.

## 2022-03-11 NOTE — Telephone Encounter (Signed)
PCP will not clear patient for left THA surgery.  Chrissie Noa left voice mail asking if any other options, can she have pain medication to help?

## 2022-04-03 IMAGING — CT CT ABD-PELV W/O CM
2 of 4 series · 15 of 46 positions shown, 17 images · non-contrast
Comparison: 10/31/2019

CLINICAL DATA: GI bleeding.  Vaginal bleeding.

EXAM:
CT ABDOMEN AND PELVIS WITHOUT CONTRAST
TECHNIQUE: Multidetector CT imaging of the abdomen and pelvis was performed
following the standard protocol without IV contrast.

[Series 3: axial st · axial · 0.93mm/px · z∈[+740,+1210]mm · 12 of 106 slices shown, 14 images]
[im 6/106  soft-tissue]
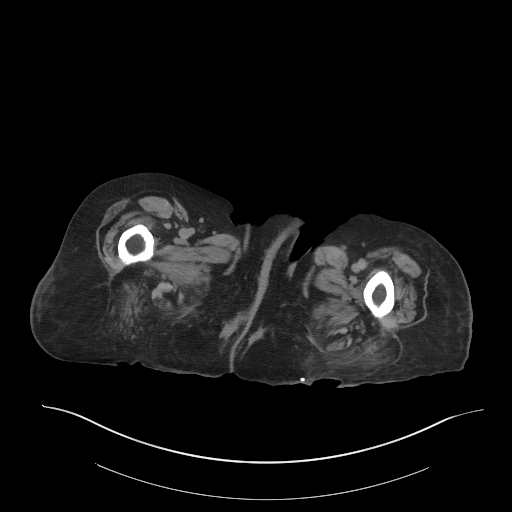
[im 6/106  bone]
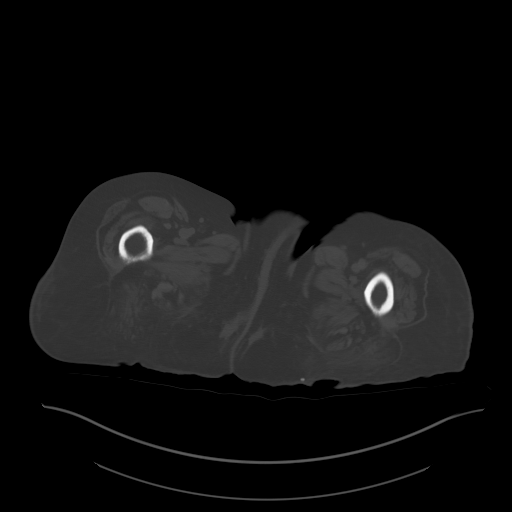
[im 18/106  soft-tissue]
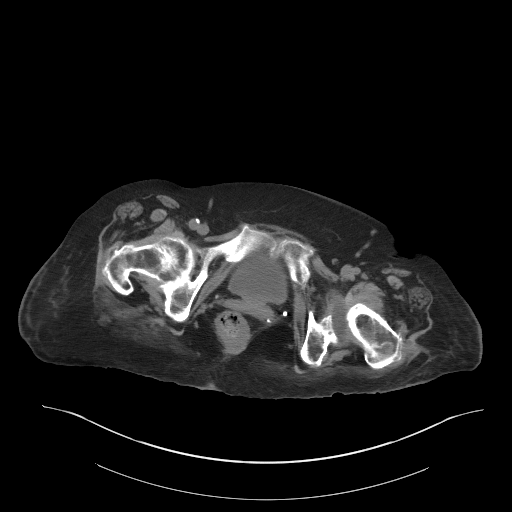
[im 24/106  soft-tissue]
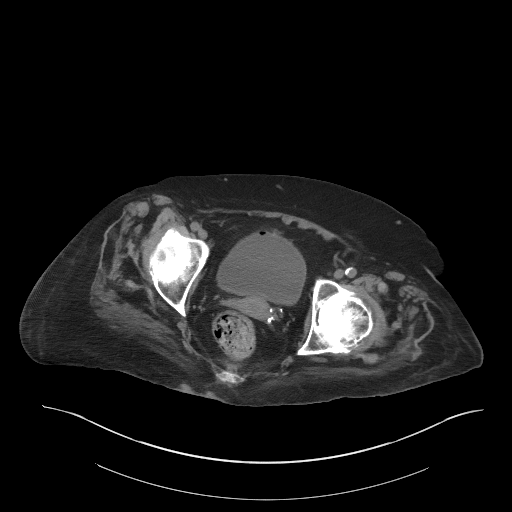
[im 30/106  soft-tissue]
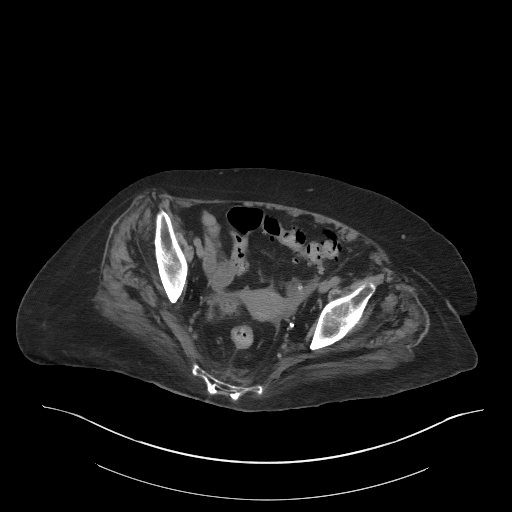
[im 41/106  soft-tissue]
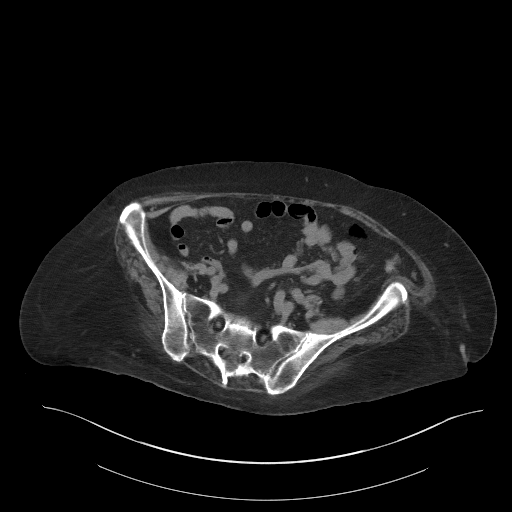
[im 47/106  soft-tissue]
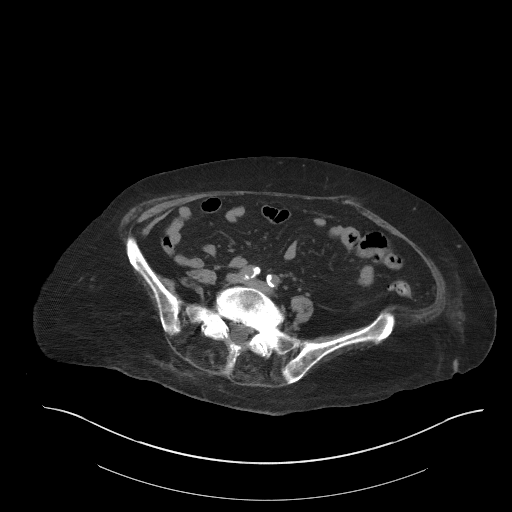
[im 59/106  soft-tissue]
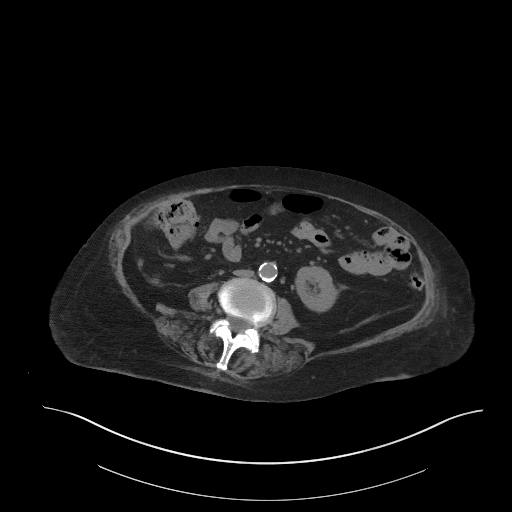
[im 65/106  soft-tissue]
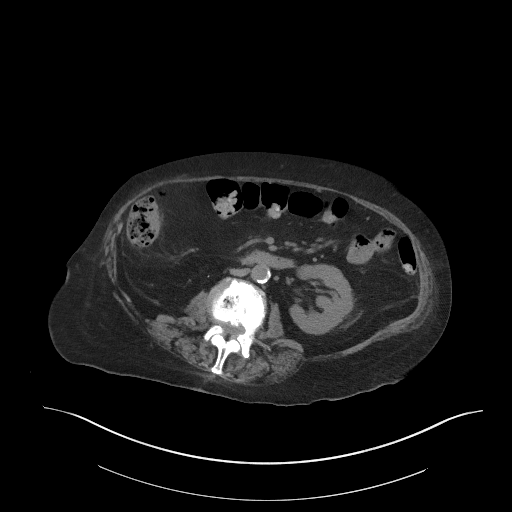
[im 76/106  soft-tissue]
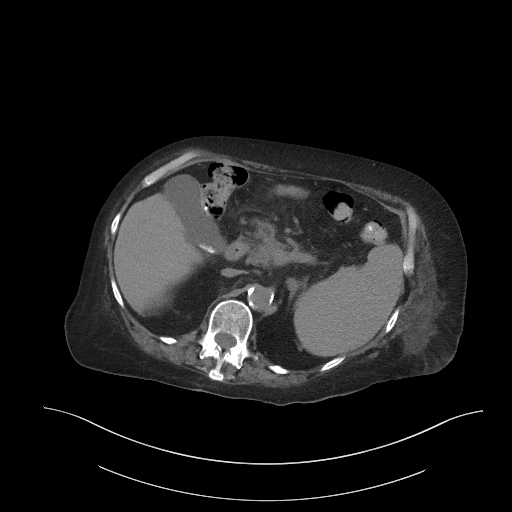
[im 76/106  bone]
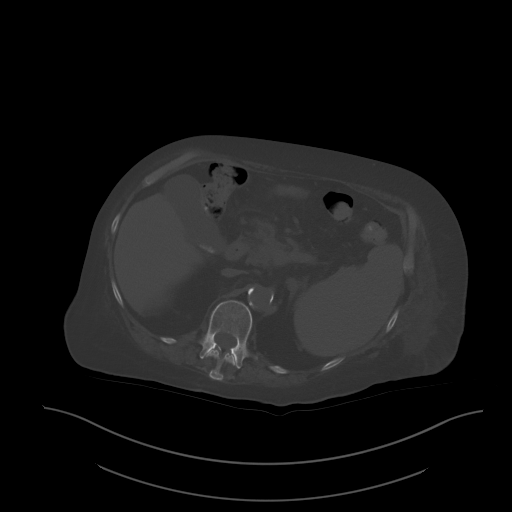
[im 82/106  soft-tissue]
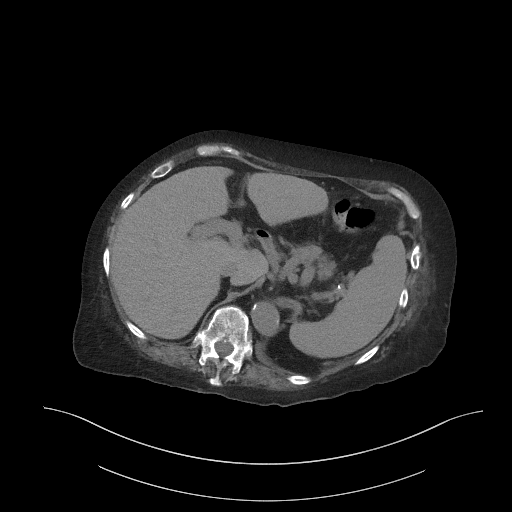
[im 88/106  soft-tissue]
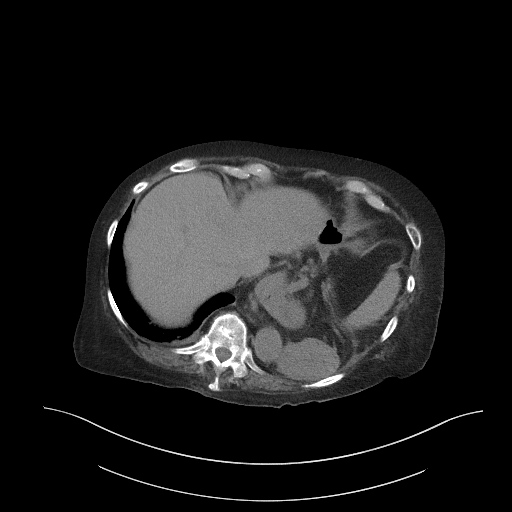
[im 100/106  soft-tissue]
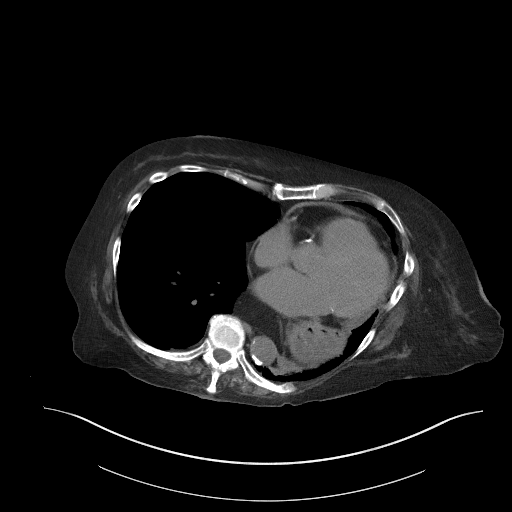

[Series 6: coronal st · coronal · 0.91mm/px · 3 of 96 slices shown]
[im 32/96  soft-tissue]
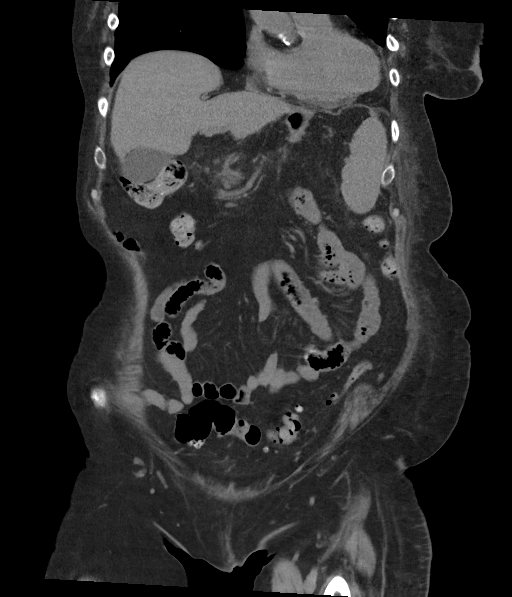
[im 43/96  soft-tissue]
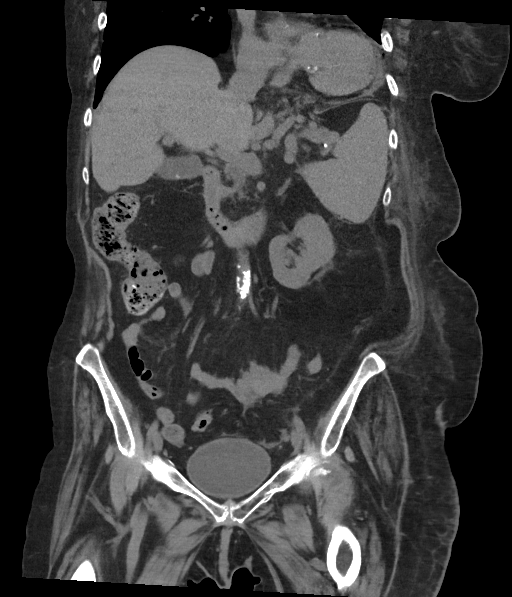
[im 53/96  soft-tissue]
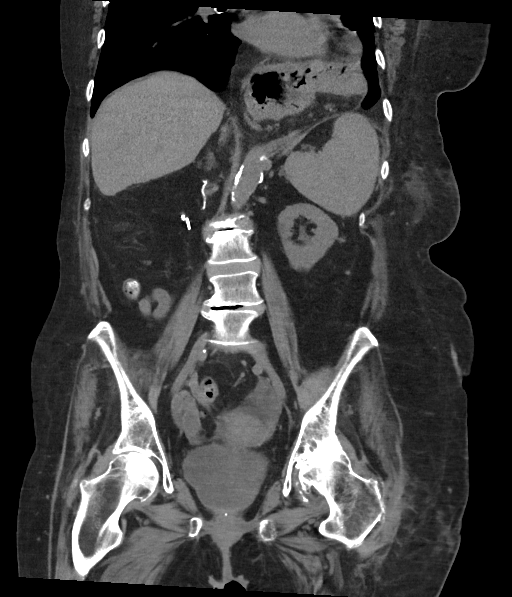

[15 of 46 positions shown; findings below may reference images not displayed]

FINDINGS: Lower chest: Large sliding hiatal hernia. Band of
scarring/atelectasis at the left lung base.

Hepatobiliary: Resolved hepatic steatosis since prior. There is
morphologic changes which could reflect cirrhosis.Cholelithiasis.

Pancreas: Loss of fat and distorted/spiculated soft tissue planes
around the pancreatic body and head with nodular and wispy density
in the adjacent mesentery. Findings are indistinguishable from the
ventral portal vein confluence. Based on previous imaging there has
been prior pancreatitis and pseudocyst formation.

Spleen: Chronic generous size which could be from portal
hypertension, dimensions measuring 14 cm.

Adrenals/Urinary Tract: Negative adrenals. No hydronephrosis or
stone. Right nephrectomy, reportedly for cancer. Small bubble of gas
in the urinary bladder, usually from sampling.

Stomach/Bowel: There is a gas and fluid collection beneath the
diaphragm in the midline measuring up to 4 cm which is contiguous
with the distal transverse colon via a soft tissue density track
like structure. There is an inferior track like structure extending
towards the pancreatic body. A pseudocyst was seen in the pancreas
in 8434 and a fluid collection was seen in this location July 2019
but not October 2019. No obstruction. No appendicitis. Colonic
diverticulosis. No other gross cause for bleeding.

Vascular/Lymphatic: No acute vascular abnormality. No mass or
adenopathy.

Reproductive:No explanation for vaginal bleeding. No evident
thickening of the endometrium or continuity with the bowel.

Other: No ascites or pneumoperitoneum. Asymmetric subcutaneous
stranding along the left upper abdominal wall which was also seen
previously and could be positional anasarca. No reported history of
trauma.

Musculoskeletal: No acute abnormalities. Multilevel disc
degeneration. Severe left hip osteoarthritis.
IMPRESSION: 1. Fat nodularity and spiculation around the pancreas which could
reflect pancreatitis and/or mass. Please correlate with liver
enzymes and recommend pancreas MRI with contrast after resolution of
any active pancreatitis.
2. 4 cm subdiaphragmatic gas collection which is contiguous with the
transverse colon and pancreatic body via soft tissue density track
like structures. Suspect this is sequela of prior pseudocysts based
on prior abdominal CTs. The gas is new from a October 2019 CT.
There is a notable lack of surrounding inflammation.
3. Resolved hepatic steatosis when compared to October 2019. There
is concern for cirrhosis.
4. Large hiatal hernia.
5. Colonic diverticulosis.
6. Cholelithiasis.

## 2022-04-07 DIAGNOSIS — Z1231 Encounter for screening mammogram for malignant neoplasm of breast: Secondary | ICD-10-CM | POA: Diagnosis not present

## 2022-05-05 ENCOUNTER — Encounter: Payer: Self-pay | Admitting: Family Medicine

## 2022-05-05 ENCOUNTER — Ambulatory Visit (INDEPENDENT_AMBULATORY_CARE_PROVIDER_SITE_OTHER): Payer: 59 | Admitting: Family Medicine

## 2022-05-05 VITALS — BP 136/84 | HR 89 | Temp 97.8°F | Ht 68.0 in

## 2022-05-05 DIAGNOSIS — K7030 Alcoholic cirrhosis of liver without ascites: Secondary | ICD-10-CM | POA: Diagnosis not present

## 2022-05-05 DIAGNOSIS — M8000XA Age-related osteoporosis with current pathological fracture, unspecified site, initial encounter for fracture: Secondary | ICD-10-CM | POA: Diagnosis not present

## 2022-05-05 DIAGNOSIS — E781 Pure hyperglyceridemia: Secondary | ICD-10-CM

## 2022-05-05 DIAGNOSIS — F1021 Alcohol dependence, in remission: Secondary | ICD-10-CM

## 2022-05-05 DIAGNOSIS — I4891 Unspecified atrial fibrillation: Secondary | ICD-10-CM | POA: Diagnosis not present

## 2022-05-05 DIAGNOSIS — I509 Heart failure, unspecified: Secondary | ICD-10-CM

## 2022-05-05 DIAGNOSIS — Z86718 Personal history of other venous thrombosis and embolism: Secondary | ICD-10-CM | POA: Diagnosis not present

## 2022-05-05 DIAGNOSIS — E1169 Type 2 diabetes mellitus with other specified complication: Secondary | ICD-10-CM

## 2022-05-05 DIAGNOSIS — M87052 Idiopathic aseptic necrosis of left femur: Secondary | ICD-10-CM

## 2022-05-05 NOTE — Patient Instructions (Signed)
Please go downstairs for a urine test and labs.   We will be in touch with your results and get you back for a follow up in 4 weeks.

## 2022-05-05 NOTE — Progress Notes (Signed)
New Patient Office Visit  Subjective    Patient ID: Nicole [name], female    DOB: 10-21-1950  Age: 72 y.o. MRN: [id number]  CC:  Chief Complaint  Patient presents with   Establish Care    Right hip issue, has xrays with her. Would like to eventually get back to walking and no longer to use wheelchair. Would like to discuss surgery    HPI Nicole [name] presents to establish care. Need medical records.  Previous PCP - Dr. Sherrie Sport in [place]   Sitting in a wheelchair. Uses a roller walker at home.   AVN- saw Dr. Lorin [name] in December.  States she needs a hip replacement but her former PCP would not sign off on her having surgery due to her chronic health conditions.   Osteoporosis - since her 72s.  Denies ever being on medication    Cirrhosis  States she stopped drinking alcohol last fall sometime.  Xifaxan on med list.   On Eliquis for A-fib and hx of DVT  Type 2 DM- takes metformin 500 mg daily   HL- on Crestor 5 mg. Fish oil.   Rents a room from her ex husband.  Her kids and grandchildren do not liver nearby.      Outpatient Encounter Medications as of 05/05/2022  Medication Sig   b complex vitamins capsule Take 1 capsule by mouth daily.   Calcium Carbonate (CALCIUM 600 PO) Take by mouth in the morning and at bedtime.   diclofenac Sodium (VOLTAREN) 1 % GEL    digoxin (LANOXIN) 0.125 MG tablet Take by mouth.   ELIQUIS 5 MG TABS tablet Take 5 mg by mouth 2 (two) times daily.   fenofibrate (TRICOR) 145 MG tablet Take 145 mg by mouth daily.   lisinopril (ZESTRIL) 2.5 MG tablet Take 2.5 mg by mouth daily.   Magnesium 250 MG TABS Take 2 tablets by mouth daily.   metFORMIN (GLUCOPHAGE) 500 MG tablet Take 500 mg by mouth daily.   metoprolol tartrate (LOPRESSOR) 50 MG tablet Take 50 mg by mouth 2 (two) times daily.   mirtazapine (REMERON) 15 MG tablet Take 15 mg by mouth at bedtime.   Multiple Vitamins-Minerals (MULTIVITAMIN WITH MINERALS) tablet Take 1 tablet by mouth daily.    Omega-3 Fatty Acids (FISH OIL PO) Take by mouth.   Potassium Gluconate 550 (90 K) MG TABS Take by mouth.   Propylene Glycol (SYSTANE COMPLETE) 0.6 % SOLN Place 1 drop into both eyes See admin instructions. Instill one drop into both eyes every morning, may also use later in the day as needed for dry eyes   rifaximin (XIFAXAN) 550 MG TABS tablet Take 1 tablet (550 mg total) by mouth 2 (two) times daily.   rosuvastatin (CRESTOR) 5 MG tablet Take 5 mg by mouth daily.   Turmeric (QC TUMERIC COMPLEX PO) Take by mouth in the morning and at bedtime.   [DISCONTINUED] cefdinir (OMNICEF) 300 MG capsule Take 1 capsule (300 mg total) by mouth every 12 (twelve) hours. X 4 days (Patient not taking: Reported on 02/03/2022)   [DISCONTINUED] ferrous sulfate 325 (65 FE) MG tablet Take 325 mg by mouth daily with breakfast.  (Patient not taking: Reported on 02/03/2022)   [DISCONTINUED] folic acid (FOLVITE) 1 MG tablet Take 1 tablet (1 mg total) by mouth daily. (Patient not taking: Reported on 05/05/2022)   [DISCONTINUED] furosemide (LASIX) 20 MG tablet  (Patient not taking: Reported on 05/05/2022)   [DISCONTINUED] lactulose (CHRONULAC) 10 GM/15ML solution Take 15 mLs (10  g total) by mouth 2 (two) times daily. (Patient not taking: Reported on 02/03/2022)   [DISCONTINUED] naltrexone (DEPADE) 50 MG tablet Take 50 mg by mouth daily. (Patient not taking: Reported on 05/05/2022)   [DISCONTINUED] omeprazole (PRILOSEC) 20 MG capsule Take 20 mg by mouth daily. (Patient not taking: Reported on 02/03/2022)   [DISCONTINUED] potassium chloride SA (KLOR-CON) 20 MEQ tablet Take by mouth.   [DISCONTINUED] predniSONE (DELTASONE) 10 MG tablet Take 3 tablets (30 mg total) by mouth daily. X 7 days; then 2 tabs (20 mg) daily x 7 days; then 1 tab (10 mg) daily x 7 days; then 1/2 tab (5 mg) daily x 7 days (Patient not taking: Reported on 02/03/2022)   [DISCONTINUED] RIVAROXABAN (XARELTO) VTE STARTER PACK (15 & 20 MG) Follow package directions:  Take one 15mg  tablet by mouth twice a day. On day 22, switch to one 20mg  tablet once a day. Take with food. (Patient not taking: Reported on 02/03/2022)   [DISCONTINUED] thiamine 100 MG tablet Take 1 tablet (100 mg total) by mouth daily. (Patient not taking: Reported on 05/05/2022)   No facility-administered encounter medications on file as of 05/05/2022.    Past Medical History:  Diagnosis Date   Anemia    Arthritis    Asthma    as a child   Atrial fibrillation with RVR ([place])    Cancer ([place])    right kidney   Chronic alcoholism ([place])    Chronic left shoulder pain    Dysrhythmia    Atrial Fib with RVR    GERD (gastroesophageal reflux disease)    Heartburn    Hiatal hernia 12/17/2016   stomach noted in chest on CT scan   History of blood transfusion    Hypertension    Memory loss    Schizophrenia ([place])    Seizures ([place])     had when she had period and when she ovalate   Thyroid nodule     Past Surgical History:  Procedure Laterality Date   ANKLE FRACTURE SURGERY Right 2008   screws   ANKLE FRACTURE SURGERY Left 2008   plates   ESOPHAGOGASTRODUODENOSCOPY (EGD) WITH PROPOFOL N/A 12/18/2016   Procedure: ESOPHAGOGASTRODUODENOSCOPY (EGD) WITH PROPOFOL;  Surgeon: Wilford Corner, MD;  Location: [facility];  Service: Endoscopy;  Laterality: N/A;   NASAL SINUS SURGERY Left 11/17/2017   Procedure: ENDOSCOPIC SINUS approach;  Surgeon: Melida Quitter, MD;  Location: [facility];  Service: ENT;  Laterality: Left;   NEPHRECTOMY Right    robotic riight radical    ORIF ORBITAL FRACTURE Left 11/17/2017   Procedure: Trans Antral Left sided orbital floor fracture repair with dissovable floor implant;  Surgeon: Melida Quitter, MD;  Location: [facility] OR;  Service: ENT;  Laterality: Left;    Family History  Problem Relation Age of Onset   Stroke Mother    Heart attack Father    Ulcers Father    Diabetes Sister    Cancer Brother    Diabetes Sister    Diabetes Sister     Social History    Socioeconomic History   Marital status: Divorced    Spouse name: Not on file   Number of children: Not on file   Years of education: Not on file   Highest education level: Not on file  Occupational History   Not on file  Tobacco Use   Smoking status: Former    Years: 15    Types: Cigarettes    Quit date: 11/14/2007    Years since  quitting: 14.4   Smokeless tobacco: Never  Vaping Use   Vaping Use: Never used  Substance and Sexual Activity   Alcohol use: Not Currently    Comment: none since fall 11/13/17   Drug use: No   Sexual activity: Not Currently  Other Topics Concern   Not on file  Social History Narrative   quit drinking in late August but went on a binge over last weekend (october 26, 26, 28).  Hasn't drank since Sunday (6 days ago) due to abd pain, N, V   Social Determinants of Health   Financial Resource Strain: Not on file  Food Insecurity: Not on file  Transportation Needs: Not on file  Physical Activity: Not on file  Stress: Not on file  Social Connections: Not on file  Intimate Partner Violence: Not on file    Review of Systems  Constitutional:  Negative for chills and fever.  Respiratory:  Negative for shortness of breath.   Cardiovascular:  Negative for chest pain, palpitations and leg swelling.  Gastrointestinal:  Negative for abdominal pain, constipation, diarrhea, nausea and vomiting.  Genitourinary:  Negative for dysuria, frequency and urgency.  Neurological:  Negative for dizziness.        Objective    BP 136/84 (BP Location: Left Arm, Patient Position: Sitting, Cuff Size: Large)   Pulse 89   Temp 97.8 F (36.6 C) (Temporal)   Ht 5\' 8"  (1.727 m)   SpO2 97%   BMI 24.33 kg/m   Physical Exam Constitutional:      General: She is not in acute distress.    Appearance: She is not ill-appearing.  Eyes:     Extraocular Movements: Extraocular movements intact.     Conjunctiva/sclera: Conjunctivae normal.  Cardiovascular:     Rate and  Rhythm: Normal rate.  Pulmonary:     Effort: Pulmonary effort is normal.  Musculoskeletal:     Cervical back: Normal range of motion and neck supple.  Skin:    General: Skin is warm and dry.  Neurological:     General: No focal deficit present.     Mental Status: She is alert and oriented to person, place, and time.  Psychiatric:        Mood and Affect: Mood normal.        Behavior: Behavior normal.        Thought Content: Thought content normal.         Assessment & Plan:   Problem List Items Addressed This Visit       Digestive   Cirrhosis of liver ([place])   Relevant Orders   Hepatic function panel (Completed)     Musculoskeletal and Integument   Avascular necrosis of bone of left hip ([place])   Other Visit Diagnoses     Type 2 diabetes mellitus with other specified complication, without long-term current use of insulin ([place])    -  Primary   Relevant Orders   CBC with Differential/Platelet (Completed)   Hemoglobin A1c (Completed)   TSH (Completed)   Basic metabolic panel (Completed)   Urinalysis, Routine w reflex microscopic (Completed)   Alcoholism in remission ([place])       Relevant Orders   Ethanol (Completed)   Atrial fibrillation, unspecified type ([place])       Relevant Orders   CBC with Differential/Platelet (Completed)   Basic metabolic panel (Completed)   History of DVT of lower extremity       Chronic heart failure, unspecified heart failure type ([place])  Age-related osteoporosis with current pathological fracture, initial encounter       Relevant Medications   Calcium Carbonate (CALCIUM 600 PO)   Other Relevant Orders   VITAMIN D 25 Hydroxy (Vit-D Deficiency, Fractures) (Completed)   Hypertriglyceridemia       Relevant Orders   Lipid panel (Completed)      She is new today. I do not have recent medical records. Previous PCP in [place].  Reviewed notes from Dr. Lorin [name] regarding hip pain.  She has several chronic health conditions. Will need to get her  back in 4 weeks pending labs and once I have her recent PCP notes.  Adjust medications sooner pending labs.   Return in about 4 weeks (around 06/02/2022) for chronic health conditions.   Harland Dingwall, NP-C

## 2022-05-06 LAB — URINALYSIS, ROUTINE W REFLEX MICROSCOPIC
Bilirubin Urine: NEGATIVE
Hgb urine dipstick: NEGATIVE
Ketones, ur: NEGATIVE
Nitrite: NEGATIVE
Specific Gravity, Urine: 1.01 (ref 1.000–1.030)
Total Protein, Urine: NEGATIVE
Urine Glucose: NEGATIVE
Urobilinogen, UA: 0.2 (ref 0.0–1.0)
pH: 7 (ref 5.0–8.0)

## 2022-05-06 LAB — CBC WITH DIFFERENTIAL/PLATELET
Basophils Absolute: 0.1 10*3/uL (ref 0.0–0.1)
Basophils Relative: 0.9 % (ref 0.0–3.0)
Eosinophils Absolute: 0.2 10*3/uL (ref 0.0–0.7)
Eosinophils Relative: 2.6 % (ref 0.0–5.0)
HCT: 41.1 % (ref 36.0–46.0)
Hemoglobin: 13.7 g/dL (ref 12.0–15.0)
Lymphocytes Relative: 8.8 % — ABNORMAL LOW (ref 12.0–46.0)
Lymphs Abs: 0.7 10*3/uL (ref 0.7–4.0)
MCHC: 33.4 g/dL (ref 30.0–36.0)
MCV: 90.3 fl (ref 78.0–100.0)
Monocytes Absolute: 0.6 10*3/uL (ref 0.1–1.0)
Monocytes Relative: 6.5 % (ref 3.0–12.0)
Neutro Abs: 6.8 10*3/uL (ref 1.4–7.7)
Neutrophils Relative %: 81.2 % — ABNORMAL HIGH (ref 43.0–77.0)
Platelets: 202 10*3/uL (ref 150.0–400.0)
RBC: 4.55 Mil/uL (ref 3.87–5.11)
RDW: 14 % (ref 11.5–15.5)
WBC: 8.4 10*3/uL (ref 4.0–10.5)

## 2022-05-06 LAB — LIPID PANEL
Cholesterol: 165 mg/dL (ref 0–200)
HDL: 29.8 mg/dL — ABNORMAL LOW (ref >39.00)
Total CHOL/HDL Ratio: 6
Triglycerides: 699 mg/dL — ABNORMAL HIGH (ref 0.0–149.0)

## 2022-05-06 LAB — LDL CHOLESTEROL, DIRECT: Direct LDL: 53 mg/dL

## 2022-05-06 LAB — BASIC METABOLIC PANEL
BUN: 33 mg/dL — ABNORMAL HIGH (ref 6–23)
CO2: 24 mEq/L (ref 19–32)
Calcium: 9.7 mg/dL (ref 8.4–10.5)
Chloride: 100 mEq/L (ref 96–112)
Creatinine, Ser: 1.54 mg/dL — ABNORMAL HIGH (ref 0.40–1.20)
GFR: 33.36 mL/min — ABNORMAL LOW (ref >60.00)
Glucose, Bld: 164 mg/dL — ABNORMAL HIGH (ref 70–99)
Potassium: 4.6 mEq/L (ref 3.5–5.1)
Sodium: 136 mEq/L (ref 135–145)

## 2022-05-06 LAB — VITAMIN D 25 HYDROXY (VIT D DEFICIENCY, FRACTURES): VITD: 27.5 ng/mL — ABNORMAL LOW (ref 30.00–100.00)

## 2022-05-06 LAB — HEPATIC FUNCTION PANEL
ALT: 22 U/L (ref 0–35)
AST: 22 U/L (ref 0–37)
Albumin: 4.3 g/dL (ref 3.5–5.2)
Alkaline Phosphatase: 60 U/L (ref 39–117)
Bilirubin, Direct: 0.1 mg/dL (ref 0.0–0.3)
Total Bilirubin: 0.4 mg/dL (ref 0.2–1.2)
Total Protein: 7 g/dL (ref 6.0–8.3)

## 2022-05-06 LAB — HEMOGLOBIN A1C: Hgb A1c MFr Bld: 7.6 % — ABNORMAL HIGH (ref 4.6–6.5)

## 2022-05-06 LAB — TSH: TSH: 1.86 u[IU]/mL (ref 0.35–5.50)

## 2022-05-06 LAB — SPECIMEN STATUS REPORT

## 2022-05-06 LAB — ETHANOL

## 2022-05-08 NOTE — Progress Notes (Signed)
She has several abnormal lab results including her kidney function, triglycerides and her diabetes is not well controlled with a Hgb A1c of 7.6%. I recommend bringing all of her medications in for her next visit so we can make some changes. Please come in well hydrated. Schedule for 2-3 weeks from now.

## 2022-06-08 ENCOUNTER — Ambulatory Visit (INDEPENDENT_AMBULATORY_CARE_PROVIDER_SITE_OTHER): Payer: 59 | Admitting: Family Medicine

## 2022-06-08 ENCOUNTER — Encounter: Payer: Self-pay | Admitting: Family Medicine

## 2022-06-08 VITALS — BP 126/82 | HR 70 | Temp 97.6°F | Ht 68.0 in

## 2022-06-08 DIAGNOSIS — M25511 Pain in right shoulder: Secondary | ICD-10-CM | POA: Diagnosis not present

## 2022-06-08 DIAGNOSIS — K7460 Unspecified cirrhosis of liver: Secondary | ICD-10-CM

## 2022-06-08 DIAGNOSIS — R82998 Other abnormal findings in urine: Secondary | ICD-10-CM

## 2022-06-08 DIAGNOSIS — R300 Dysuria: Secondary | ICD-10-CM | POA: Diagnosis not present

## 2022-06-08 DIAGNOSIS — G8929 Other chronic pain: Secondary | ICD-10-CM

## 2022-06-08 DIAGNOSIS — E1169 Type 2 diabetes mellitus with other specified complication: Secondary | ICD-10-CM | POA: Diagnosis not present

## 2022-06-08 DIAGNOSIS — E781 Pure hyperglyceridemia: Secondary | ICD-10-CM | POA: Diagnosis not present

## 2022-06-08 DIAGNOSIS — M87052 Idiopathic aseptic necrosis of left femur: Secondary | ICD-10-CM | POA: Diagnosis not present

## 2022-06-08 DIAGNOSIS — N189 Chronic kidney disease, unspecified: Secondary | ICD-10-CM

## 2022-06-08 DIAGNOSIS — E119 Type 2 diabetes mellitus without complications: Secondary | ICD-10-CM | POA: Insufficient documentation

## 2022-06-08 LAB — POC URINALSYSI DIPSTICK (AUTOMATED)
Bilirubin, UA: NEGATIVE
Blood, UA: NEGATIVE
Glucose, UA: NEGATIVE
Ketones, UA: NEGATIVE
Nitrite, UA: NEGATIVE
Protein, UA: NEGATIVE
Spec Grav, UA: 1.01 (ref 1.010–1.025)
Urobilinogen, UA: 0.2 E.U./dL
pH, UA: 6.5 (ref 5.0–8.0)

## 2022-06-08 MED ORDER — BLOOD GLUCOSE MONITORING SUPPL DEVI
1.0000 | Freq: Three times a day (TID) | 0 refills | Status: AC
Start: 2022-06-08 — End: ?

## 2022-06-08 MED ORDER — LANCET DEVICE MISC
1.0000 | Freq: Three times a day (TID) | 0 refills | Status: AC
Start: 2022-06-08 — End: 2022-07-08

## 2022-06-08 MED ORDER — LANCETS MISC. MISC
1.0000 | Freq: Three times a day (TID) | 3 refills | Status: AC
Start: 2022-06-08 — End: 2022-07-08

## 2022-06-08 MED ORDER — BLOOD GLUCOSE TEST VI STRP
1.0000 | ORAL_STRIP | Freq: Three times a day (TID) | 3 refills | Status: DC
Start: 2022-06-08 — End: 2022-11-24

## 2022-06-08 MED ORDER — METFORMIN HCL 500 MG PO TABS
ORAL_TABLET | ORAL | 1 refills | Status: DC
Start: 2022-06-08 — End: 2022-11-24

## 2022-06-08 NOTE — Progress Notes (Signed)
Subjective:     Patient ID: Nicole [name], female    DOB: Sep 26, 1950, 72 y.o.   MRN: [id number]  Chief Complaint  Patient presents with   Medical Management of Chronic Issues    4 week f/u    HPI Patient is in today for follow up on chronic health conditions.   Complains of dysuria.   DM- does not check BS at home.  Taking metformin 500 mg daily.   Hoping to have hip surgery for AVN    Health Maintenance Due  Topic Date Due   FOOT EXAM  Never done   OPHTHALMOLOGY EXAM  Never done   COLONOSCOPY (Pts 45-67yrs Insurance coverage will need to be confirmed)  Never done   MAMMOGRAM  Never done   DEXA SCAN  Never done   Pneumonia Vaccine 57+ Years old (2 of 2 - PCV) 01/05/2018    Past Medical History:  Diagnosis Date   Anemia    Arthritis    Asthma    as a child   Atrial fibrillation with RVR    Cancer    right kidney   Chronic alcoholism    Chronic left shoulder pain    Dysrhythmia    Atrial Fib with RVR    GERD (gastroesophageal reflux disease)    Heartburn    Hiatal hernia 12/17/2016   stomach noted in chest on CT scan   History of blood transfusion    Hypertension    Memory loss    Schizophrenia    Seizures     had when she had period and when she ovalate   Thyroid nodule     Past Surgical History:  Procedure Laterality Date   ANKLE FRACTURE SURGERY Right 2008   screws   ANKLE FRACTURE SURGERY Left 2008   plates   ESOPHAGOGASTRODUODENOSCOPY (EGD) WITH PROPOFOL N/A 12/18/2016   Procedure: ESOPHAGOGASTRODUODENOSCOPY (EGD) WITH PROPOFOL;  Surgeon: Charlott Rakes, MD;  Location: [facility] ENDOSCOPY;  Service: Endoscopy;  Laterality: N/A;   NASAL SINUS SURGERY Left 11/17/2017   Procedure: ENDOSCOPIC SINUS approach;  Surgeon: Christia Reading, MD;  Location: [facility] OR;  Service: ENT;  Laterality: Left;   NEPHRECTOMY Right    robotic riight radical    ORIF ORBITAL FRACTURE Left 11/17/2017   Procedure: Trans Antral Left sided orbital floor fracture repair with  dissovable floor implant;  Surgeon: Christia Reading, MD;  Location[facility] OR;  Service: ENT;  Laterality: Left;    Family History  Problem Relation Age of Onset   Stroke Mother    Heart attack Father    Ulcers Father    Diabetes Sister    Cancer Brother    Diabetes Sister    Diabetes Sister     Social History   Socioeconomic History   Marital status: Divorced    Spouse name: Not on file   Number of children: Not on file   Years of education: Not on file   Highest education level: Associate degree: academic program  Occupational History   Not on file  Tobacco Use   Smoking status: Former    Years: 15    Types: Cigarettes    Quit date: 11/14/2007    Years since quitting: 14.5   Smokeless tobacco: Never  Vaping Use   Vaping Use: Never used  Substance and Sexual Activity   Alcohol use: Not Currently    Comment: none since fall 11/13/17   Drug use: No   Sexual activity: Not Currently  Other Topics Concern  Not on file  Social History Narrative   quit drinking in late August but went on a binge over last weekend (october 26, 26, 28).  Hasn't drank since Sunday (6 days ago) due to abd pain, N, V   Social Determinants of Health   Financial Resource Strain: Low Risk  (06/05/2022)   Overall Financial Resource Strain (CARDIA)    Difficulty of Paying Living Expenses: Not very hard  Food Insecurity: No Food Insecurity (06/05/2022)   Hunger Vital Sign    Worried About Running Out of Food in the Last Year: Never true    Ran Out of Food in the Last Year: Never true  Transportation Needs: No Transportation Needs (06/05/2022)   PRAPARE - Administrator, Civil Service (Medical): No    Lack of Transportation (Non-Medical): No  Physical Activity: Insufficiently Active (06/05/2022)   Exercise Vital Sign    Days of Exercise per Week: 5 days    Minutes of Exercise per Session: 20 min  Stress: Stress Concern Present (06/05/2022)   [employer] of Occupational Health -  Occupational Stress Questionnaire    Feeling of Stress : Very much  Social Connections: Unknown (06/05/2022)   Social Connection and Isolation Panel [NHANES]    Frequency of Communication with Friends and Family: Three times a week    Frequency of Social Gatherings with Friends and Family: Patient declined    Attends Religious Services: Patient declined    Database administrator or Organizations: No    Attends Engineer, structural: Not on file    Marital Status: Divorced  Intimate Partner Violence: Not on file    Outpatient Medications Prior to Visit  Medication Sig Dispense Refill   acetaminophen (TYLENOL) 500 MG tablet Take 500 mg by mouth 2 (two) times daily.     b complex vitamins capsule Take 1 capsule by mouth daily.     Calcium Carbonate (CALCIUM 600 PO) Take by mouth in the morning and at bedtime.     digoxin (LANOXIN) 0.125 MG tablet Take 125 mcg by mouth daily.     ELIQUIS 5 MG TABS tablet Take 5 mg by mouth 2 (two) times daily.     fenofibrate (TRICOR) 145 MG tablet Take 145 mg by mouth daily.     Fexofenadine HCl (ALLERGY 24-HR PO) Take by mouth.     lidocaine 4 % Place 1 patch onto the skin as needed.     lisinopril (ZESTRIL) 2.5 MG tablet Take 2.5 mg by mouth daily.     loperamide (IMODIUM) 2 MG capsule Take by mouth as needed for diarrhea or loose stools.     Magnesium 250 MG TABS Take 2 tablets by mouth daily.     metoprolol tartrate (LOPRESSOR) 50 MG tablet Take 50 mg by mouth 2 (two) times daily.     mirtazapine (REMERON) 15 MG tablet Take 15 mg by mouth at bedtime.     Omega-3 Fatty Acids (FISH OIL PO) Take 1,200 mg by mouth daily.     Potassium 99 MG TABS Take by mouth daily.     Propylene Glycol (SYSTANE COMPLETE) 0.6 % SOLN Place 1 drop into both eyes See admin instructions. Instill one drop into both eyes every morning, may also use later in the day as needed for dry eyes     rifaximin (XIFAXAN) 550 MG TABS tablet Take 1 tablet (550 mg total) by mouth 2  (two) times daily. 60 tablet 1   rosuvastatin (CRESTOR) 5 MG  tablet Take 5 mg by mouth daily.     Turmeric (QC TUMERIC COMPLEX PO) Take 1,000 mg by mouth in the morning and at bedtime.     UNABLE TO FIND Med Name: Gynovite Plus     metFORMIN (GLUCOPHAGE) 500 MG tablet Take 500 mg by mouth daily.     diclofenac Sodium (VOLTAREN) 1 % GEL  (Patient not taking: Reported on 06/08/2022)     Multiple Vitamins-Minerals (MULTIVITAMIN WITH MINERALS) tablet Take 1 tablet by mouth daily.     Potassium Gluconate 550 (90 K) MG TABS Take by mouth.     No facility-administered medications prior to visit.    Allergies  Allergen Reactions   Other Shortness Of Breath and Anxiety    UNSPECIFIED REACTION to "fast foods and processed food"   Shellfish Allergy Shortness Of Breath   Iodinated Contrast Media Swelling    Hand swelling from contrast dye for MRI    Review of Systems  Constitutional:  Negative for chills and fever.  Respiratory:  Negative for shortness of breath.   Cardiovascular:  Negative for chest pain, palpitations and leg swelling.  Gastrointestinal:  Negative for abdominal pain, constipation, diarrhea, nausea and vomiting.  Genitourinary:  Positive for dysuria. Negative for frequency and urgency.  Musculoskeletal:  Positive for joint pain.  Neurological:  Negative for dizziness.       Objective:    Physical Exam Constitutional:      General: She is not in acute distress.    Appearance: She is not ill-appearing.  Eyes:     Extraocular Movements: Extraocular movements intact.     Conjunctiva/sclera: Conjunctivae normal.  Cardiovascular:     Rate and Rhythm: Normal rate.  Pulmonary:     Effort: Pulmonary effort is normal.  Musculoskeletal:     Cervical back: Normal range of motion and neck supple.     Comments: Right shoulder with limited ROM  Skin:    General: Skin is warm and dry.  Neurological:     General: No focal deficit present.     Mental Status: She is alert and  oriented to person, place, and time.     Comments: In wheelchair   Psychiatric:        Mood and Affect: Mood normal.        Behavior: Behavior normal.        Thought Content: Thought content normal.     BP 126/82 (BP Location: Left Arm, Patient Position: Sitting, Cuff Size: Large)   Pulse 70   Temp 97.6 F (36.4 C) (Temporal)   Ht  (1.727 m)   SpO2 98%   BMI 24.33 kg/m  Wt Readings from Last 3 Encounters:  02/03/22 160 lb (72.6 kg)  06/22/20 134 lb (60.8 kg)  06/20/20 135 lb (61.2 kg)       Assessment & Plan:   Problem List Items Addressed This Visit       Digestive   Cirrhosis of liver   Relevant Orders   Comprehensive metabolic panel     Endocrine   Diabetes mellitus   Relevant Medications   metFORMIN (GLUCOPHAGE) 500 MG tablet   Blood Glucose Monitoring Suppl DEVI   Glucose Blood (BLOOD GLUCOSE TEST STRIPS) STRP   Lancet Device MISC   Lancets Misc. MISC     Musculoskeletal and Integument   Avascular necrosis of bone of left hip   Other Visit Diagnoses     Dysuria    -  Primary   Relevant Orders  POCT Urinalysis Dipstick (Automated) (Completed)   Urine Culture   Leukocytes in urine       Relevant Orders   Urine Culture   Hypertriglyceridemia       Relevant Orders   Lipid panel   Chronic kidney disease, unspecified CKD stage       Relevant Orders   Comprehensive metabolic panel   Chronic right shoulder pain       Relevant Medications   acetaminophen (TYLENOL) 500 MG tablet      UA shows large amount of leuks. Urine culture sent.   Diabetes - A1c 7.6 % one month ago. Increase metformin to 1,000 mg with breakfast and 500 mg with evening meal. Cut back on sugar and carbohydrates. Start checking BS at home.   Triglycerides- she was not fasting at her last lab visit. Recheck lipids today. Continue statin and fenofibrate. LDL controlled. Counseling on healthy diet for triglycerides. Handout provided.  Consider Lovaza   Follow up with  orthopedist for hip and knee pain.   I have discontinued Rianne Bohlken's multivitamin with minerals, metFORMIN, and Potassium Gluconate. I am also having her start on metFORMIN, Blood Glucose Monitoring Suppl, BLOOD GLUCOSE TEST STRIPS, Lancet Device, and Lancets Misc.. Additionally, I am having her maintain her Propylene Glycol, metoprolol tartrate, Magnesium, rifaximin, diclofenac Sodium, digoxin, mirtazapine, fenofibrate, lisinopril, rosuvastatin, Eliquis, Omega-3 Fatty Acids (FISH OIL PO), Turmeric (QC TUMERIC COMPLEX PO), Calcium Carbonate (CALCIUM 600 PO), b complex vitamins, UNABLE TO FIND, Potassium, lidocaine, loperamide, Fexofenadine HCl (ALLERGY 24-HR PO), and acetaminophen.  Meds ordered this encounter  Medications   metFORMIN (GLUCOPHAGE) 500 MG tablet    Sig: Take 1,000 mg (2 tablets) with breakfast and 500 mg (1 tablet) with evening meal.    Dispense:  270 tablet    Refill:  1   Blood Glucose Monitoring Suppl DEVI    Sig: 1 each by Does not apply route in the morning, at noon, and at bedtime. May substitute to any manufacturer covered by patient's insurance.    Dispense:  1 each    Refill:  0   Glucose Blood (BLOOD GLUCOSE TEST STRIPS) STRP    Sig: 1 each by In Vitro route in the morning, at noon, and at bedtime. May substitute to any manufacturer covered by patient's insurance.    Dispense:  100 strip    Refill:  3   Lancet Device MISC    Sig: 1 each by Does not apply route in the morning, at noon, and at bedtime. May substitute to any manufacturer covered by patient's insurance.    Dispense:  1 each    Refill:  0   Lancets Misc. MISC    Sig: 1 each by Does not apply route in the morning, at noon, and at bedtime. May substitute to any manufacturer covered by patient's insurance.    Dispense:  100 each    Refill:  3

## 2022-06-08 NOTE — Patient Instructions (Signed)
Increase your metformin to 1,000 mg with breakfast and 500 mg in the evening.   Check your blood sugars at home in your fingers only. Keep a record of these.  If you have any readings less than 70 or higher than 200, let me know.   Call and schedule with your orthopedist for your hip and shoulder.   We will be in touch with your lab results and urine culture.

## 2022-06-09 LAB — COMPREHENSIVE METABOLIC PANEL
ALT: 20 U/L (ref 0–35)
AST: 26 U/L (ref 0–37)
Albumin: 4.3 g/dL (ref 3.5–5.2)
Alkaline Phosphatase: 49 U/L (ref 39–117)
BUN: 32 mg/dL — ABNORMAL HIGH (ref 6–23)
CO2: 27 mEq/L (ref 19–32)
Calcium: 9.9 mg/dL (ref 8.4–10.5)
Chloride: 93 mEq/L — ABNORMAL LOW (ref 96–112)
Creatinine, Ser: 1.54 mg/dL — ABNORMAL HIGH (ref 0.40–1.20)
GFR: 33.33 mL/min — ABNORMAL LOW (ref >60.00)
Glucose, Bld: 125 mg/dL — ABNORMAL HIGH (ref 70–99)
Potassium: 4.3 mEq/L (ref 3.5–5.1)
Sodium: 132 mEq/L — ABNORMAL LOW (ref 135–145)
Total Bilirubin: 0.5 mg/dL (ref 0.2–1.2)
Total Protein: 7.1 g/dL (ref 6.0–8.3)

## 2022-06-09 LAB — LIPID PANEL
Cholesterol: 181 mg/dL (ref 0–200)
HDL: 33.2 mg/dL — ABNORMAL LOW (ref >39.00)
Total CHOL/HDL Ratio: 5
Triglycerides: 508 mg/dL — ABNORMAL HIGH (ref 0.0–149.0)

## 2022-06-09 LAB — LDL CHOLESTEROL, DIRECT: Direct LDL: 86 mg/dL

## 2022-06-10 LAB — URINE CULTURE

## 2022-06-14 ENCOUNTER — Other Ambulatory Visit: Payer: Self-pay | Admitting: Family Medicine

## 2022-06-14 DIAGNOSIS — N3000 Acute cystitis without hematuria: Secondary | ICD-10-CM

## 2022-06-14 MED ORDER — AMOXICILLIN-POT CLAVULANATE 500-125 MG PO TABS
500.0000 mg | ORAL_TABLET | Freq: Two times a day (BID) | ORAL | 0 refills | Status: DC
Start: 2022-06-14 — End: 2022-07-20

## 2022-06-14 NOTE — Progress Notes (Signed)
I am sending in a new medication to treat her triglycerides called Lovaza. She will have to let us know if it is not affordable or if she has trouble getting the medication. Does she have a kidney specialist (nephrologist). If so, she needs to call and schedule an urgent visit with them due to her declining kidney function. Stop lisinopril to see if this helps her kidney function. Also, she has a UTI. I will send in an antibiotic to treat this.

## 2022-06-14 NOTE — Progress Notes (Signed)
Actually, she is already taking an Omega 3 fatty acid and fenofibrate. If she has been consistent with these medications, then I will need to refer her to the lipid clinic.

## 2022-06-15 ENCOUNTER — Other Ambulatory Visit: Payer: Self-pay | Admitting: Family Medicine

## 2022-06-15 DIAGNOSIS — I2510 Atherosclerotic heart disease of native coronary artery without angina pectoris: Secondary | ICD-10-CM

## 2022-06-15 DIAGNOSIS — I5032 Chronic diastolic (congestive) heart failure: Secondary | ICD-10-CM

## 2022-06-15 DIAGNOSIS — N1832 Chronic kidney disease, stage 3b: Secondary | ICD-10-CM

## 2022-06-15 DIAGNOSIS — E781 Pure hyperglyceridemia: Secondary | ICD-10-CM | POA: Insufficient documentation

## 2022-06-15 DIAGNOSIS — Z905 Acquired absence of kidney: Secondary | ICD-10-CM

## 2022-06-21 ENCOUNTER — Encounter: Payer: Self-pay | Admitting: *Deleted

## 2022-06-27 ENCOUNTER — Other Ambulatory Visit: Payer: Self-pay | Admitting: Family Medicine

## 2022-06-27 ENCOUNTER — Telehealth: Payer: Self-pay | Admitting: Family Medicine

## 2022-06-27 NOTE — Telephone Encounter (Signed)
Pt has not had this filled since 2021 by another provider, please advise

## 2022-06-27 NOTE — Telephone Encounter (Signed)
Prescription Request  06/27/2022  LOV: 06/08/2022  What is the name of the medication or equipment? [email or web address]  Have you contacted your pharmacy to request a refill? Yes   Which pharmacy would you like this sent to?  [id number] [place], [place] - [address] [place] [address] Phone: [phone] Fax: [phone]    Patient notified that their request is being sent to the clinical staff for review and that they should receive a response within 2 business days.   Please advise at Mobile [phone] (mobile)

## 2022-06-28 MED ORDER — RIFAXIMIN 550 MG PO TABS: 550.0000 mg | ORAL_TABLET | Freq: Two times a day (BID) | ORAL | 0 refills | Status: DC

## 2022-06-28 NOTE — Telephone Encounter (Signed)
Rx sent 

## 2022-06-28 NOTE — Telephone Encounter (Signed)
Called pt and she reports this is for her liver and she has been taking it for a while now as her last PCP was prescribing it for her before she switched to [facility] but she has been taking it regularly.

## 2022-07-14 ENCOUNTER — Ambulatory Visit: Payer: 59 | Admitting: Family Medicine

## 2022-07-15 ENCOUNTER — Other Ambulatory Visit: Payer: Self-pay | Admitting: Family Medicine

## 2022-07-15 NOTE — Telephone Encounter (Signed)
1st fill with you.. ok to refill?  

## 2022-07-20 ENCOUNTER — Encounter: Payer: Self-pay | Admitting: Family Medicine

## 2022-07-20 ENCOUNTER — Ambulatory Visit (INDEPENDENT_AMBULATORY_CARE_PROVIDER_SITE_OTHER): Payer: 59 | Admitting: Family Medicine

## 2022-07-20 VITALS — BP 132/84 | HR 56 | Temp 97.6°F | Ht 68.0 in

## 2022-07-20 DIAGNOSIS — M8000XA Age-related osteoporosis with current pathological fracture, unspecified site, initial encounter for fracture: Secondary | ICD-10-CM

## 2022-07-20 DIAGNOSIS — E871 Hypo-osmolality and hyponatremia: Secondary | ICD-10-CM | POA: Diagnosis not present

## 2022-07-20 DIAGNOSIS — I2510 Atherosclerotic heart disease of native coronary artery without angina pectoris: Secondary | ICD-10-CM | POA: Diagnosis not present

## 2022-07-20 DIAGNOSIS — E781 Pure hyperglyceridemia: Secondary | ICD-10-CM

## 2022-07-20 DIAGNOSIS — Z7984 Long term (current) use of oral hypoglycemic drugs: Secondary | ICD-10-CM | POA: Diagnosis not present

## 2022-07-20 DIAGNOSIS — K7460 Unspecified cirrhosis of liver: Secondary | ICD-10-CM

## 2022-07-20 DIAGNOSIS — E1121 Type 2 diabetes mellitus with diabetic nephropathy: Secondary | ICD-10-CM

## 2022-07-20 DIAGNOSIS — I5032 Chronic diastolic (congestive) heart failure: Secondary | ICD-10-CM | POA: Diagnosis not present

## 2022-07-20 DIAGNOSIS — H259 Unspecified age-related cataract: Secondary | ICD-10-CM | POA: Diagnosis not present

## 2022-07-20 DIAGNOSIS — K089 Disorder of teeth and supporting structures, unspecified: Secondary | ICD-10-CM

## 2022-07-20 DIAGNOSIS — Z905 Acquired absence of kidney: Secondary | ICD-10-CM | POA: Diagnosis not present

## 2022-07-20 DIAGNOSIS — I509 Heart failure, unspecified: Secondary | ICD-10-CM

## 2022-07-20 NOTE — Patient Instructions (Addendum)
Please call and schedule with the following:   [facility] [address] [place] [address] [phone]  Dr. Annell Greening at [facility] for your hip [phone]     You may call and schedule with a psychiatrist        [email or web address] [phone]  [facility]  [phone]   [facility] [address] [place], [place] [address]  Phone: [phone]  [facility]  [address], [place], [place] [address]  Phone: [phone]

## 2022-07-20 NOTE — Progress Notes (Signed)
Subjective:     Patient ID: Nicole [name], female    DOB: 1950-09-07, 72 y.o.   MRN: [id number]  Chief Complaint  Patient presents with   Medical Management of Chronic Issues    1 month f/u, has BS readings    HPI  Discussed the use of AI scribe software for clinical note transcription with the patient, who gave verbal consent to proceed.  History of Present Illness          States she would like to see an eye doctor. Reports hx of cataracts and states her vision is worsening. She does not currently have an ophthalmologist  DM- last A1c 7.6% 2 months ago. Reports good compliance with medications and diet improved.   Checking BS at home. Range has improved.   Stopped energy drinks.   CKD- she has not heard from nephrology.  Stopped lisinopril for the past 4 wks.   Osteoporosis - states she has never taken medication for osteoporosis.   Hypertriglyceridemia- Omega 3 fatty acid and fenofibrate   Depression - denies SI  Would like to see a psychiatrist. Her health makes her depressed.    Requesting that I fill out a form for her to have dental extraction under local anesthesia. She is getting a partial and dentures. Having difficulty eating.     Health Maintenance Due  Topic Date Due   COVID-19 Vaccine (1) Never done   FOOT EXAM  Never done   OPHTHALMOLOGY EXAM  Never done   MAMMOGRAM  Never done   DEXA SCAN  Never done   Pneumonia Vaccine 41+ Years old (2 of 2 - PCV) 01/05/2018    Past Medical History:  Diagnosis Date   Anemia    Arthritis    Asthma    as a child   Atrial fibrillation with RVR (HCC)    Cancer (HCC)    right kidney   Chronic alcoholism (HCC)    Chronic left shoulder pain    Dysrhythmia    Atrial Fib with RVR    GERD (gastroesophageal reflux disease)    Heartburn    Hiatal hernia 12/17/2016   stomach noted in chest on CT scan   History of blood transfusion    Hypertension    Memory loss    Schizophrenia (HCC)    Seizures (HCC)      had when she had period and when she ovalate   Thyroid nodule     Past Surgical History:  Procedure Laterality Date   ANKLE FRACTURE SURGERY Right 2008   screws   ANKLE FRACTURE SURGERY Left 2008   plates   ESOPHAGOGASTRODUODENOSCOPY (EGD) WITH PROPOFOL N/A 12/18/2016   Procedure: ESOPHAGOGASTRODUODENOSCOPY (EGD) WITH PROPOFOL;  Surgeon: Charlott Rakes, MD;  Location[facility] ENDOSCOPY;  Service: Endoscopy;  Laterality: N/A;   NASAL SINUS SURGERY Left 11/17/2017   Procedure: ENDOSCOPIC SINUS approach;  Surgeon: Christia Reading, MD;  Location: [facility] OR;  Service: ENT;  Laterality: Left;   NEPHRECTOMY Right    robotic riight radical    ORIF ORBITAL FRACTURE Left 11/17/2017   Procedure: Trans Antral Left sided orbital floor fracture repair with dissovable floor implant;  Surgeon: Christia Reading, MD;  Location: [facility] OR;  Service: ENT;  Laterality: Left;    Family History  Problem Relation Age of Onset   Stroke Mother    Heart attack Father    Ulcers Father    Diabetes Sister    Cancer Brother    Diabetes Sister    Diabetes  Sister     Social History   Socioeconomic History   Marital status: Divorced    Spouse name: Not on file   Number of children: Not on file   Years of education: Not on file   Highest education level: Associate degree: academic program  Occupational History   Not on file  Tobacco Use   Smoking status: Former    Years: 15    Types: Cigarettes    Quit date: 11/14/2007    Years since quitting: 14.7   Smokeless tobacco: Never  Vaping Use   Vaping Use: Never used  Substance and Sexual Activity   Alcohol use: Not Currently    Comment: none since fall 11/13/17   Drug use: No   Sexual activity: Not Currently  Other Topics Concern   Not on file  Social History Narrative   quit drinking in late August but went on a binge over last weekend (october 26, 26, 28).  Hasn't drank since Sunday (6 days ago) due to abd pain, N, V   Social Determinants of Health    Financial Resource Strain: Low Risk  (06/05/2022)   Overall Financial Resource Strain (CARDIA)    Difficulty of Paying Living Expenses: Not very hard  Food Insecurity: No Food Insecurity (06/05/2022)   Hunger Vital Sign    Worried About Running Out of Food in the Last Year: Never true    Ran Out of Food in the Last Year: Never true  Transportation Needs: No Transportation Needs (06/05/2022)   PRAPARE - Administrator, Civil Service (Medical): No    Lack of Transportation (Non-Medical): No  Physical Activity: Insufficiently Active (06/05/2022)   Exercise Vital Sign    Days of Exercise per Week: 5 days    Minutes of Exercise per Session: 20 min  Stress: Stress Concern Present (06/05/2022)   [employer] of Occupational Health - Occupational Stress Questionnaire    Feeling of Stress : Very much  Social Connections: Unknown (06/05/2022)   Social Connection and Isolation Panel [NHANES]    Frequency of Communication with Friends and Family: Three times a week    Frequency of Social Gatherings with Friends and Family: Patient declined    Attends Religious Services: Patient declined    Database administrator or Organizations: No    Attends Engineer, structural: Not on file    Marital Status: Divorced  Intimate Partner Violence: Not on file    Outpatient Medications Prior to Visit  Medication Sig Dispense Refill   acetaminophen (TYLENOL) 500 MG tablet Take 500 mg by mouth 2 (two) times daily.     b complex vitamins capsule Take 1 capsule by mouth daily.     Blood Glucose Monitoring Suppl DEVI 1 each by Does not apply route in the morning, at noon, and at bedtime. May substitute to any manufacturer covered by patient's insurance. 1 each 0   Calcium Carbonate (CALCIUM 600 PO) Take by mouth in the morning and at bedtime.     diclofenac Sodium (VOLTAREN) 1 % GEL      ELIQUIS 5 MG TABS tablet Take 5 mg by mouth 2 (two) times daily.     fenofibrate (TRICOR) 145 MG tablet  Take 145 mg by mouth daily.     Fexofenadine HCl (ALLERGY 24-HR PO) Take by mouth.     Glucose Blood (BLOOD GLUCOSE TEST STRIPS) STRP 1 each by In Vitro route in the morning, at noon, and at bedtime. May substitute to any  manufacturer covered by [employer]. 100 strip 3   lidocaine 4 % Place 1 patch onto the skin as needed.     lisinopril (ZESTRIL) 2.5 MG tablet Take 2.5 mg by mouth daily.     loperamide (IMODIUM) 2 MG capsule Take by mouth as needed for diarrhea or loose stools.     Magnesium 250 MG TABS Take 2 tablets by mouth daily.     metFORMIN (GLUCOPHAGE) 500 MG tablet Take 1,000 mg (2 tablets) with breakfast and 500 mg (1 tablet) with evening meal. 270 tablet 1   metoprolol tartrate (LOPRESSOR) 50 MG tablet Take 50 mg by mouth 2 (two) times daily.     mirtazapine (REMERON) 15 MG tablet Take 15 mg by mouth at bedtime.     Omega-3 Fatty Acids (FISH OIL PO) Take 1,200 mg by mouth daily.     Potassium 99 MG TABS Take by mouth daily.     Propylene Glycol (SYSTANE COMPLETE) 0.6 % SOLN Place 1 drop into both eyes See admin instructions. Instill one drop into both eyes every morning, may also use later in the day as needed for dry eyes     rifaximin (XIFAXAN) 550 MG TABS tablet Take 1 tablet (550 mg total) by mouth 2 (two) times daily. 180 tablet 0   rosuvastatin (CRESTOR) 5 MG tablet TAKE ONE TABLET BY MOUTH ONCE DAILY 90 tablet 0   Turmeric (QC TUMERIC COMPLEX PO) Take 1,000 mg by mouth in the morning and at bedtime.     UNABLE TO FIND Med Name: Gynovite Plus     amoxicillin-clavulanate (AUGMENTIN) 500-125 MG tablet Take 1 tablet by mouth in the morning and at bedtime. 10 tablet 0   digoxin (LANOXIN) 0.125 MG tablet Take 125 mcg by mouth daily.     No facility-administered medications prior to visit.    Allergies  Allergen Reactions   Other Shortness Of Breath and Anxiety    UNSPECIFIED REACTION to "fast foods and processed food"   Shellfish Allergy Shortness Of Breath    Iodinated Contrast Media Swelling    Hand swelling from contrast dye for MRI    Review of Systems  Constitutional:  Negative for chills, fever, malaise/fatigue and weight loss.  Eyes:        Decreased vision due to cataracts  Respiratory:  Negative for shortness of breath.   Cardiovascular:  Negative for chest pain, palpitations and leg swelling.  Gastrointestinal:  Negative for abdominal pain, constipation, diarrhea, nausea and vomiting.  Genitourinary:  Negative for dysuria, frequency and urgency.  Musculoskeletal:  Positive for joint pain. Negative for falls.  Neurological:  Negative for dizziness, focal weakness and headaches.  Psychiatric/Behavioral:  Positive for depression. Negative for substance abuse and suicidal ideas.        Objective:    Physical Exam Constitutional:      General: She is not in acute distress.    Appearance: She is not ill-appearing.  Eyes:     Extraocular Movements: Extraocular movements intact.     Conjunctiva/sclera: Conjunctivae normal.  Cardiovascular:     Rate and Rhythm: Normal rate.  Pulmonary:     Effort: Pulmonary effort is normal.  Musculoskeletal:     Cervical back: Normal range of motion and neck supple.  Skin:    General: Skin is warm and dry.  Neurological:     General: No focal deficit present.     Mental Status: She is alert and oriented to person, place, and time.  Psychiatric:  Mood and Affect: Mood normal.        Behavior: Behavior normal.        Thought Content: Thought content normal.      BP 132/84 (BP Location: Left Arm, Patient Position: Sitting, Cuff Size: Large)   Pulse (!) 56   Temp 97.6 F (36.4 C) (Temporal)   Ht 5\' 8"  (1.727 m)   SpO2 98%   BMI 24.33 kg/m  Wt Readings from Last 3 Encounters:  02/03/22 160 lb (72.6 kg)  06/22/20 134 lb (60.8 kg)  06/20/20 135 lb (61.2 kg)       Assessment & Plan:   Problem List Items Addressed This Visit       Cardiovascular and Mediastinum   Athscl  heart disease of native coronary artery w/o ang pctrs    Referral has been made to the lipid clinic due to uncontrolled triglycerides      Chronic heart failure (HCC)    Euvolemic.  Asymptomatic.  Continue current medication regimen        Digestive   Cirrhosis of liver (HCC)    Appears asymptomatic.  She will continue to avoid alcohol and hepatotoxic medications.      Poor dentition    Form filled out today for her upcoming oral surgery.  She will have local anesthesia.  She may stop Eliquis 2 days prior to the procedure and restart 1 to 2 days following procedure pending any surgical complications.        Endocrine   Diabetic nephropathy associated with type 2 diabetes mellitus (HCC) - Primary    Referred to nephrology.        Musculoskeletal and Integument   Age-related osteoporosis with current pathological fracture    Unknown last bone density.  Will attempt to find this result.  Consider ordering DEXA at her follow-up.  For now she will continue getting adequate calcium and vitamin D.  Unfortunately, due to her hip, she is unable to do significant weightbearing exercises        Other   Age-related cataract of both eyes    Reported by patient.  She does not currently have an ophthalmologist.  Referral made.      Relevant Orders   Ambulatory referral to Ophthalmology   H/O right nephrectomy    She has been referred to [facility].  She will call them to schedule a visit since she has not heard back.      Hypertriglyceridemia    Uncontrolled on medications.  Referred to lipid clinic      Hyponatremia    Chronic       I have discontinued Arynn Pfeifer's amoxicillin-clavulanate. I am also having her maintain her Propylene Glycol, metoprolol tartrate, Magnesium, diclofenac Sodium, digoxin, mirtazapine, fenofibrate, lisinopril, Eliquis, Omega-3 Fatty Acids (FISH OIL PO), Turmeric (QC TUMERIC COMPLEX PO), Calcium Carbonate (CALCIUM 600 PO), b complex vitamins,  UNABLE TO FIND, Potassium, lidocaine, loperamide, Fexofenadine HCl (ALLERGY 24-HR PO), acetaminophen, metFORMIN, Blood Glucose Monitoring Suppl, BLOOD GLUCOSE TEST STRIPS, rifaximin, and rosuvastatin.  No orders of the defined types were placed in this encounter.

## 2022-07-25 DIAGNOSIS — E1121 Type 2 diabetes mellitus with diabetic nephropathy: Secondary | ICD-10-CM | POA: Insufficient documentation

## 2022-07-25 DIAGNOSIS — K089 Disorder of teeth and supporting structures, unspecified: Secondary | ICD-10-CM | POA: Insufficient documentation

## 2022-07-25 DIAGNOSIS — H259 Unspecified age-related cataract: Secondary | ICD-10-CM | POA: Insufficient documentation

## 2022-07-25 DIAGNOSIS — M8000XA Age-related osteoporosis with current pathological fracture, unspecified site, initial encounter for fracture: Secondary | ICD-10-CM | POA: Insufficient documentation

## 2022-07-25 NOTE — Assessment & Plan Note (Addendum)
Unknown last bone density.  Will attempt to find this result.  Consider ordering DEXA at her follow-up.  For now she will continue getting adequate calcium and vitamin D.  Unfortunately, due to her hip, she is unable to do significant weightbearing exercises

## 2022-07-25 NOTE — Assessment & Plan Note (Signed)
-   Referred to nephrology.

## 2022-07-25 NOTE — Assessment & Plan Note (Signed)
Referral has been made to the lipid clinic due to uncontrolled triglycerides

## 2022-07-25 NOTE — Assessment & Plan Note (Signed)
Euvolemic.  Asymptomatic.  Continue current medication regimen

## 2022-07-25 NOTE — Assessment & Plan Note (Signed)
She has been referred to [facility].  She will call them to schedule a visit since she has not heard back.

## 2022-07-25 NOTE — Assessment & Plan Note (Signed)
Reported by patient.  She does not currently have an ophthalmologist.  Referral made.

## 2022-07-25 NOTE — Assessment & Plan Note (Signed)
Uncontrolled on medications.  Referred to lipid clinic

## 2022-07-25 NOTE — Assessment & Plan Note (Signed)
Chronic. 

## 2022-07-25 NOTE — Assessment & Plan Note (Signed)
Appears asymptomatic.  She will continue to avoid alcohol and hepatotoxic medications.

## 2022-07-25 NOTE — Assessment & Plan Note (Signed)
Form filled out today for her upcoming oral surgery.  She will have local anesthesia.  She may stop Eliquis 2 days prior to the procedure and restart 1 to 2 days following procedure pending any surgical complications.

## 2022-07-26 ENCOUNTER — Telehealth: Payer: Self-pay | Admitting: Family Medicine

## 2022-07-26 MED ORDER — MIRTAZAPINE 15 MG PO TABS: 15.0000 mg | ORAL_TABLET | Freq: Every day | ORAL | 0 refills | Status: DC

## 2022-07-26 MED ORDER — LISINOPRIL 2.5 MG PO TABS: 2.5000 mg | ORAL_TABLET | Freq: Every day | ORAL | 0 refills | Status: DC

## 2022-07-26 MED ORDER — DIGOXIN 125 MCG PO TABS: 125.0000 ug | ORAL_TABLET | Freq: Every day | ORAL | 0 refills | Status: DC

## 2022-07-26 MED ORDER — ELIQUIS 5 MG PO TABS: 5.0000 mg | ORAL_TABLET | Freq: Two times a day (BID) | ORAL | 0 refills | Status: DC

## 2022-07-26 MED ORDER — METOPROLOL TARTRATE 50 MG PO TABS: 50.0000 mg | ORAL_TABLET | Freq: Two times a day (BID) | ORAL | 0 refills | Status: DC

## 2022-07-26 NOTE — Telephone Encounter (Signed)
Pt has been referred to cardiology and appt is on 11/28/22.   Rx sent for 90 days

## 2022-07-26 NOTE — Telephone Encounter (Signed)
These are all 1st fills with you... ok to refill?

## 2022-07-26 NOTE — Telephone Encounter (Signed)
Prescription Request  07/26/2022  LOV: 07/20/2022  What is the name of the medication or equipment? Elliquis, 5 mg, metoprolol 50 mg, digoxin 125 mg, remeron 15 mg, lisinopril 2.5  Have you contacted your pharmacy to request a refill? Yes   Which pharmacy would you like this sent to?  [facility] [phone] - [place], [place] - [address] AT [facility] [address] [place] [address] Phone: [phone] Fax: [phone]    Patient notified that their request is being sent to the clinical staff for review and that they should receive a response within 2 business days.   Please advise at Mobile [phone] (mobile)

## 2022-08-08 LAB — HM DIABETES EYE EXAM

## 2022-08-09 ENCOUNTER — Ambulatory Visit (INDEPENDENT_AMBULATORY_CARE_PROVIDER_SITE_OTHER): Payer: 59

## 2022-08-09 VITALS — Ht 68.0 in

## 2022-08-09 DIAGNOSIS — Z0000 Encounter for general adult medical examination without abnormal findings: Secondary | ICD-10-CM

## 2022-08-09 NOTE — Progress Notes (Signed)
Subjective:   Nicole [name] is a 72 y.o. female who presents for Medicare Annual (Subsequent) preventive examination.  Visit Complete: Virtual  I connected with  [name] on 08/09/22 by a audio enabled telemedicine application and verified that I am speaking with the correct person using two identifiers.  Patient Location: Home  Provider Location: Home Office  I discussed the limitations of evaluation and management by telemedicine. The patient expressed understanding and agreed to proceed.  Patient Medicare AWV questionnaire was completed by the patient on ; I have confirmed that all information answered by patient is correct and no changes since this date.  Review of Systems     Cardiac Risk Factors include: advanced age (>48men, >55 women);diabetes mellitus;hypertension     Objective:    Today's Vitals   08/09/22 1438  Height: 5\' 8"  (1.727 m)   Body mass index is 24.33 kg/m.     08/09/2022    2:49 PM 06/22/2020   12:58 PM 06/20/2020    7:18 PM 01/06/2020   11:08 AM 11/01/2019   10:55 AM 11/13/2017    2:52 PM 12/18/2016    7:47 AM  Advanced Directives  Does Patient Have a Medical Advance Directive? Yes No No No No No No  Type of Estate agent of [place];Living will        Does patient want to make changes to medical advance directive? No - Patient declined        Copy of Healthcare Power of Attorney in Chart? Yes - validated most recent copy scanned in chart (See row information)        Would patient like information on creating a medical advance directive?   No - Patient declined  No - Patient declined No - Patient declined No - Patient declined    Current Medications (verified) Outpatient Encounter Medications as of 08/09/2022  Medication Sig   acetaminophen (TYLENOL) 500 MG tablet Take 500 mg by mouth 2 (two) times daily.   b complex vitamins capsule Take 1 capsule by mouth daily.   Blood Glucose Monitoring Suppl DEVI 1 each by Does not  apply route in the morning, at noon, and at bedtime. May substitute to any manufacturer covered by patient's insurance.   Calcium Carbonate (CALCIUM 600 PO) Take by mouth in the morning and at bedtime.   diclofenac Sodium (VOLTAREN) 1 % GEL    digoxin (LANOXIN) 0.125 MG tablet Take 1 tablet (125 mcg total) by mouth daily.   ELIQUIS 5 MG TABS tablet Take 1 tablet (5 mg total) by mouth 2 (two) times daily.   fenofibrate (TRICOR) 145 MG tablet Take 145 mg by mouth daily.   Fexofenadine HCl (ALLERGY 24-HR PO) Take by mouth.   Glucose Blood (BLOOD GLUCOSE TEST STRIPS) STRP 1 each by In Vitro route in the morning, at noon, and at bedtime. May substitute to any manufacturer covered by patient's insurance.   lidocaine 4 % Place 1 patch onto the skin as needed.   lisinopril (ZESTRIL) 2.5 MG tablet Take 1 tablet (2.5 mg total) by mouth daily.   loperamide (IMODIUM) 2 MG capsule Take by mouth as needed for diarrhea or loose stools.   Magnesium 250 MG TABS Take 2 tablets by mouth daily.   metFORMIN (GLUCOPHAGE) 500 MG tablet Take 1,000 mg (2 tablets) with breakfast and 500 mg (1 tablet) with evening meal.   metoprolol tartrate (LOPRESSOR) 50 MG tablet Take 1 tablet (50 mg total) by mouth 2 (two) times daily.  mirtazapine (REMERON) 15 MG tablet Take 1 tablet (15 mg total) by mouth at bedtime.   Omega-3 Fatty Acids (FISH OIL PO) Take 1,200 mg by mouth daily.   Potassium 99 MG TABS Take by mouth daily.   Propylene Glycol (SYSTANE COMPLETE) 0.6 % SOLN Place 1 drop into both eyes See admin instructions. Instill one drop into both eyes every morning, may also use later in the day as needed for dry eyes   rifaximin (XIFAXAN) 550 MG TABS tablet Take 1 tablet (550 mg total) by mouth 2 (two) times daily.   rosuvastatin (CRESTOR) 5 MG tablet TAKE ONE TABLET BY MOUTH ONCE DAILY   Turmeric (QC TUMERIC COMPLEX PO) Take 1,000 mg by mouth in the morning and at bedtime.   UNABLE TO FIND Med Name: Gynovite Plus   No  facility-administered encounter medications on file as of 08/09/2022.    Allergies (verified) Other, Shellfish allergy, and Iodinated contrast media   History: Past Medical History:  Diagnosis Date   Anemia    Arthritis    Asthma    as a child   Atrial fibrillation with RVR (HCC)    Cancer (HCC)    right kidney   Chronic alcoholism (HCC)    Chronic left shoulder pain    Dysrhythmia    Atrial Fib with RVR    GERD (gastroesophageal reflux disease)    Heartburn    Hiatal hernia 12/17/2016   stomach noted in chest on CT scan   History of blood transfusion    Hypertension    Memory loss    Schizophrenia (HCC)    Seizures (HCC)     had when she had period and when she ovalate   Thyroid nodule    Past Surgical History:  Procedure Laterality Date   ANKLE FRACTURE SURGERY Right 2008   screws   ANKLE FRACTURE SURGERY Left 2008   plates   ESOPHAGOGASTRODUODENOSCOPY (EGD) WITH PROPOFOL N/A 12/18/2016   Procedure: ESOPHAGOGASTRODUODENOSCOPY (EGD) WITH PROPOFOL;  Surgeon: Charlott Rakes, MD;  Location: [facility] ENDOSCOPY;  Service: Endoscopy;  Laterality: N/A;   NASAL SINUS SURGERY Left 11/17/2017   Procedure: ENDOSCOPIC SINUS approach;  Surgeon: Christia Reading, MD;  Location: [facility] OR;  Service: ENT;  Laterality: Left;   NEPHRECTOMY Right    robotic riight radical    ORIF ORBITAL FRACTURE Left 11/17/2017   Procedure: Trans Antral Left sided orbital floor fracture repair with dissovable floor implant;  Surgeon: Christia Reading, MD;  Location: [facility] OR;  Service: ENT;  Laterality: Left;   Family History  Problem Relation Age of Onset   Stroke Mother    Heart attack Father    Ulcers Father    Diabetes Sister    Cancer Brother    Diabetes Sister    Diabetes Sister    Social History   Socioeconomic History   Marital status: Divorced    Spouse name: Not on file   Number of children: Not on file   Years of education: Not on file   Highest education level: Associate degree: academic  program  Occupational History   Not on file  Tobacco Use   Smoking status: Former    Years: 15    Types: Cigarettes    Quit date: 11/14/2007    Years since quitting: 14.7   Smokeless tobacco: Never  Vaping Use   Vaping Use: Never used  Substance and Sexual Activity   Alcohol use: Not Currently    Comment: none since fall 11/13/17   Drug  use: No   Sexual activity: Not Currently  Other Topics Concern   Not on file  Social History Narrative   quit drinking in late August but went on a binge over last weekend (october 26, 26, 28).  Hasn't drank since Sunday (6 days ago) due to abd pain, N, V   Social Determinants of Health   Financial Resource Strain: Low Risk  (08/09/2022)   Overall Financial Resource Strain (CARDIA)    Difficulty of Paying Living Expenses: Not hard at all  Food Insecurity: No Food Insecurity (08/09/2022)   Hunger Vital Sign    Worried About Running Out of Food in the Last Year: Never true    Ran Out of Food in the Last Year: Never true  Transportation Needs: No Transportation Needs (08/09/2022)   PRAPARE - Administrator, Civil Service (Medical): No    Lack of Transportation (Non-Medical): No  Physical Activity: Inactive (08/09/2022)   Exercise Vital Sign    Days of Exercise per Week: 0 days    Minutes of Exercise per Session: 0 min  Stress: No Stress Concern Present (08/09/2022)   [employer] of Occupational Health - Occupational Stress Questionnaire    Feeling of Stress : Not at all  Recent Concern: Stress - Stress Concern Present (06/05/2022)   [employer] of Occupational Health - Occupational Stress Questionnaire    Feeling of Stress : Very much  Social Connections: Socially Isolated (08/09/2022)   Social Connection and Isolation Panel [NHANES]    Frequency of Communication with Friends and Family: More than three times a week    Frequency of Social Gatherings with Friends and Family: More than three times a week    Attends Religious  Services: Never    Database administrator or Organizations: No    Attends Engineer, structural: Never    Marital Status: Divorced    Tobacco Counseling Counseling given: Not Answered   Clinical Intake:  Pre-visit preparation completed: No  Pain : No/denies pain     Nutritional Risks: None Diabetes: Yes CBG done?: Yes (CBG 130 Taken by patient) CBG resulted in Enter/ Edit results?: Yes Did pt. bring in CBG monitor from home?: No  How often do you need to have someone help you when you read instructions, pamphlets, or other written materials from your doctor or pharmacy?: 1 - Never  Interpreter Needed?: No  Information entered by :: Theresa Mulligan  LPN   Activities of Daily Living    08/09/2022    2:46 PM  In your present state of health, do you have any difficulty performing the following activities:  Hearing? 0  Vision? 0  Difficulty concentrating or making decisions? 0  Walking or climbing stairs? 1  Comment Uses a wheelchair  Dressing or bathing? 0  Doing errands, shopping? 1  Comment Family Ship broker and eating ? N  Using the Toilet? N  In the past six months, have you accidently leaked urine? N  Do you have problems with loss of bowel control? N  Managing your Medications? N  Managing your Finances? N  Housekeeping or managing your Housekeeping? N    Patient Care Team: Avanell Shackleton, NP-C as PCP - General (Family Medicine)  Indicate any recent Medical Services you may have received from other than [facility] providers in the past year (date may be approximate).     Assessment:   This is a routine wellness examination for Nicole [name].  Hearing/Vision screen Hearing Screening -  Comments:: Denies hearing difficulties   Vision Screening - Comments:: Wears rx glasses - up to date with routine eye exams with  Deferred  Dietary issues and exercise activities discussed:     Goals Addressed               This Visit's Progress      Stop using wheelchair (pt-stated)         Depression Screen    08/09/2022    2:46 PM 06/08/2022    4:15 PM 05/05/2022    3:57 PM  PHQ 2/9 Scores  PHQ - 2 Score 0 4 1  PHQ- 9 Score  13 4    Fall Risk    08/09/2022    2:48 PM 05/05/2022    3:57 PM  Fall Risk   Falls in the past year? 0 0  Number falls in past yr: 0 0  Injury with Fall? 0 0  Risk for fall due to : No Fall Risks Impaired balance/gait;Impaired mobility  Follow up Falls prevention discussed Falls evaluation completed    MEDICARE RISK AT HOME:  Medicare Risk at Home - 08/09/22 1454     Any stairs in or around the home? No    If so, are there any without handrails? No    Home free of loose throw rugs in walkways, pet beds, electrical cords, etc? Yes    Adequate lighting in your home to reduce risk of falls? Yes    Life alert? No    Use of a cane, walker or w/c? Yes    Grab bars in the bathroom? Yes    Shower chair or bench in shower? Yes    Elevated toilet seat or a handicapped toilet? Yes             TIMED UP AND GO:  Was the test performed?  No    Cognitive Function:        08/09/2022    2:50 PM  6CIT Screen  What Year? 0 points  What month? 0 points  What time? 0 points  Count back from 20 0 points  Months in reverse 0 points  Repeat phrase 0 points  Total Score 0 points    Immunizations Immunization History  Administered Date(s) Administered   Influenza, High Dose Seasonal PF 12/20/2016, 01/05/2017   Pneumococcal Polysaccharide-23 01/05/2017   Tdap 12/04/2015    TDAP status: Up to date  Flu Vaccine status: Declined, Education has been provided regarding the importance of this vaccine but patient still declined. Advised may receive this vaccine at local pharmacy or Health Dept. Aware to provide a copy of the vaccination record if obtained from local pharmacy or Health Dept. Verbalized acceptance and understanding.  Pneumococcal vaccine status: Declined,  Education has been provided  regarding the importance of this vaccine but patient still declined. Advised may receive this vaccine at local pharmacy or Health Dept. Aware to provide a copy of the vaccination record if obtained from local pharmacy or Health Dept. Verbalized acceptance and understanding.   Covid-19 vaccine status: Declined, Education has been provided regarding the importance of this vaccine but patient still declined. Advised may receive this vaccine at local pharmacy or Health Dept.or vaccine clinic. Aware to provide a copy of the vaccination record if obtained from local pharmacy or Health Dept. Verbalized acceptance and understanding.  Qualifies for Shingles Vaccine? Yes   Zostavax completed No   Shingrix Completed?: No.    Education has been provided regarding the importance of  this vaccine. Patient has been advised to call insurance company to determine out of pocket expense if they have not yet received this vaccine. Advised may also receive vaccine at local pharmacy or Health Dept. Verbalized acceptance and understanding.  Screening Tests Health Maintenance  Topic Date Due   COVID-19 Vaccine (1) Never done   FOOT EXAM  Never done   OPHTHALMOLOGY EXAM  Never done   Zoster Vaccines- Shingrix (1 of 2) Never done   MAMMOGRAM  Never done   DEXA SCAN  Never done   Pneumonia Vaccine 52+ Years old (2 of 2 - PCV) 01/05/2018   INFLUENZA VACCINE  09/15/2022   HEMOGLOBIN A1C  11/05/2022   Colonoscopy  05/27/2024   DTaP/Tdap/Td (2 - Td or Tdap) 12/03/2025   Hepatitis C Screening  Completed   HPV VACCINES  Aged Out    Health Maintenance  Health Maintenance Due  Topic Date Due   COVID-19 Vaccine (1) Never done   FOOT EXAM  Never done   OPHTHALMOLOGY EXAM  Never done   Zoster Vaccines- Shingrix (1 of 2) Never done   MAMMOGRAM  Never done   DEXA SCAN  Never done   Pneumonia Vaccine 33+ Years old (2 of 2 - PCV) 01/05/2018    Colorectal cancer screening: Type of screening: Colonoscopy. Completed  05/28/14. Repeat every 10 years  Mammogram status: Ordered Deferred. Pt provided with contact info and advised to call to schedule appt.   Bone Density status: Ordered Deferred. Pt provided with contact info and advised to call to schedule appt.  Lung Cancer Screening: (Low Dose CT Chest recommended if Age 83-80 years, 20 pack-year currently smoking OR have quit w/in 15years.) does not qualify.     Additional Screening:  Hepatitis C Screening: does qualify; Completed 10/31/19  Vision Screening: Recommended annual ophthalmology exams for early detection of glaucoma and other disorders of the eye. Is the patient up to date with their annual eye exam?  Yes  Who is the provider or what is the name of the office in which the patient attends annual eye exams? Deferred If pt is not established with a provider, would they like to be referred to a provider to establish care? No .   Dental Screening: Recommended annual dental exams for proper oral hygiene  Diabetic Foot Exam: Diabetic Foot Exam: Overdue, Pt has been advised about the importance in completing this exam. Pt is scheduled for diabetic foot exam on Followed by PCP.  Community Resource Referral / Chronic Care Management:  CRR required this visit?  No   CCM required this visit?  No     Plan:     I have personally reviewed and noted the following in the patient's chart:   Medical and social history Use of alcohol, tobacco or illicit drugs  Current medications and supplements including opioid prescriptions. Patient is not currently taking opioid prescriptions. Functional ability and status Nutritional status Physical activity Advanced directives List of other physicians Hospitalizations, surgeries, and ER visits in previous 12 months Vitals Screenings to include cognitive, depression, and falls Referrals and appointments  In addition, I have reviewed and discussed with patient certain preventive protocols, quality  metrics, and best practice recommendations. A written personalized care plan for preventive services as well as general preventive health recommendations were provided to patient.     Tillie Rung, LPN   10/13/5619   After Visit Summary: (MyChart) Due to this being a telephonic visit, the after visit summary with patients personalized  plan was offered to patient via MyChart   Nurse Notes: None

## 2022-08-09 NOTE — Patient Instructions (Addendum)
Nicole [name] , Thank you for taking time to come for your Medicare Wellness Visit. I appreciate your ongoing commitment to your health goals. Please review the following plan we discussed and let me know if I can assist you in the future.   These are the goals we discussed:  Goals       Stop using wheelchair (pt-stated)        This is a list of the screening recommended for you and due dates:  Health Maintenance  Topic Date Due   COVID-19 Vaccine (1) Never done   Complete foot exam   Never done   Eye exam for diabetics  Never done   Zoster (Shingles) Vaccine (1 of 2) Never done   Mammogram  Never done   DEXA scan (bone density measurement)  Never done   Pneumonia Vaccine (2 of 2 - PCV) 01/05/2018   Flu Shot  09/15/2022   Hemoglobin A1C  11/05/2022   Colon Cancer Screening  05/27/2024   DTaP/Tdap/Td vaccine (2 - Td or Tdap) 12/03/2025   Hepatitis C Screening  Completed   HPV Vaccine  Aged Out    Advanced directives: In Chart  Conditions/risks identified: None  Next appointment: Follow up in one year for your annual wellness visit    Preventive Care 65 Years and Older, Female Preventive care refers to lifestyle choices and visits with your health care provider that can promote health and wellness. What does preventive care include? A yearly physical exam. This is also called an annual well check. Dental exams once or twice a year. Routine eye exams. Ask your health care provider how often you should have your eyes checked. Personal lifestyle choices, including: Daily care of your teeth and gums. Regular physical activity. Eating a healthy diet. Avoiding tobacco and drug use. Limiting alcohol use. Practicing safe sex. Taking low-dose aspirin every day. Taking vitamin and mineral supplements as recommended by your health care provider. What happens during an annual well check? The services and screenings done by your health care provider during your annual well check will  depend on your age, overall health, lifestyle risk factors, and family history of disease. Counseling  Your health care provider may ask you questions about your: Alcohol use. Tobacco use. Drug use. Emotional well-being. Home and relationship well-being. Sexual activity. Eating habits. History of falls. Memory and ability to understand (cognition). Work and work Astronomer. Reproductive health. Screening  You may have the following tests or measurements: Height, weight, and BMI. Blood pressure. Lipid and cholesterol levels. These may be checked every 5 years, or more frequently if you are over 67 years old. Skin check. Lung cancer screening. You may have this screening every year starting at age 15 if you have a 30-pack-year history of smoking and currently smoke or have quit within the past 15 years. Fecal occult blood test (FOBT) of the stool. You may have this test every year starting at age 77. Flexible sigmoidoscopy or colonoscopy. You may have a sigmoidoscopy every 5 years or a colonoscopy every 10 years starting at age 47. Hepatitis C blood test. Hepatitis B blood test. Sexually transmitted disease (STD) testing. Diabetes screening. This is done by checking your blood sugar (glucose) after you have not eaten for a while (fasting). You may have this done every 1-3 years. Bone density scan. This is done to screen for osteoporosis. You may have this done starting at age 58. Mammogram. This may be done every 1-2 years. Talk to your health care  provider about how often you should have regular mammograms. Talk with your health care provider about your test results, treatment options, and if necessary, the need for more tests. Vaccines  Your health care provider may recommend certain vaccines, such as: Influenza vaccine. This is recommended every year. Tetanus, diphtheria, and acellular pertussis (Tdap, Td) vaccine. You may need a Td booster every 10 years. Zoster vaccine. You may  need this after age 17. Pneumococcal 13-valent conjugate (PCV13) vaccine. One dose is recommended after age 62. Pneumococcal polysaccharide (PPSV23) vaccine. One dose is recommended after age 53. Talk to your health care provider about which screenings and vaccines you need and how often you need them. This information is not intended to replace advice given to you by your health care provider. Make sure you discuss any questions you have with your health care provider. Document Released: 02/27/2015 Document Revised: 10/21/2015 Document Reviewed: 12/02/2014 Elsevier Interactive Patient Education  2017 [place] Prevention in the Home Falls can cause injuries. They can happen to people of all ages. There are many things you can do to make your home safe and to help prevent falls. What can I do on the outside of my home? Regularly fix the edges of walkways and driveways and fix any cracks. Remove anything that might make you trip as you walk through a door, such as a raised step or threshold. Trim any bushes or trees on the path to your home. Use bright outdoor lighting. Clear any walking paths of anything that might make someone trip, such as rocks or tools. Regularly check to see if handrails are loose or broken. Make sure that both sides of any steps have handrails. Any raised decks and porches should have guardrails on the edges. Have any leaves, snow, or ice cleared regularly. Use sand or salt on walking paths during winter. Clean up any spills in your garage right away. This includes oil or grease spills. What can I do in the bathroom? Use night lights. Install grab bars by the toilet and in the tub and shower. Do not use towel bars as grab bars. Use non-skid mats or decals in the tub or shower. If you need to sit down in the shower, use a plastic, non-slip stool. Keep the floor dry. Clean up any water that spills on the floor as soon as it happens. Remove soap buildup in  the tub or shower regularly. Attach bath mats securely with double-sided non-slip rug tape. Do not have throw rugs and other things on the floor that can make you trip. What can I do in the bedroom? Use night lights. Make sure that you have a light by your bed that is easy to reach. Do not use any sheets or blankets that are too big for your bed. They should not hang down onto the floor. Have a firm chair that has side arms. You can use this for support while you get dressed. Do not have throw rugs and other things on the floor that can make you trip. What can I do in the kitchen? Clean up any spills right away. Avoid walking on wet floors. Keep items that you use a lot in easy-to-reach places. If you need to reach something above you, use a strong step stool that has a grab bar. Keep electrical cords out of the way. Do not use floor polish or wax that makes floors slippery. If you must use wax, use non-skid floor wax. Do not have throw rugs  and other things on the floor that can make you trip. What can I do with my stairs? Do not leave any items on the stairs. Make sure that there are handrails on both sides of the stairs and use them. Fix handrails that are broken or loose. Make sure that handrails are as long as the stairways. Check any carpeting to make sure that it is firmly attached to the stairs. Fix any carpet that is loose or worn. Avoid having throw rugs at the top or bottom of the stairs. If you do have throw rugs, attach them to the floor with carpet tape. Make sure that you have a light switch at the top of the stairs and the bottom of the stairs. If you do not have them, ask someone to add them for you. What else can I do to help prevent falls? Wear shoes that: Do not have high heels. Have rubber bottoms. Are comfortable and fit you well. Are closed at the toe. Do not wear sandals. If you use a stepladder: Make sure that it is fully opened. Do not climb a closed  stepladder. Make sure that both sides of the stepladder are locked into place. Ask someone to hold it for you, if possible. Clearly mark and make sure that you can see: Any grab bars or handrails. First and last steps. Where the edge of each step is. Use tools that help you move around (mobility aids) if they are needed. These include: Canes. Walkers. Scooters. Crutches. Turn on the lights when you go into a dark area. Replace any light bulbs as soon as they burn out. Set up your furniture so you have a clear path. Avoid moving your furniture around. If any of your floors are uneven, fix them. If there are any pets around you, be aware of where they are. Review your medicines with your doctor. Some medicines can make you feel dizzy. This can increase your chance of falling. Ask your doctor what other things that you can do to help prevent falls. This information is not intended to replace advice given to you by your health care provider. Make sure you discuss any questions you have with your health care provider. Document Released: 11/27/2008 Document Revised: 07/09/2015 Document Reviewed: 03/07/2014 Elsevier Interactive Patient Education  2017 [employer].

## 2022-09-05 ENCOUNTER — Other Ambulatory Visit: Payer: Self-pay | Admitting: Family Medicine

## 2022-09-29 ENCOUNTER — Encounter (INDEPENDENT_AMBULATORY_CARE_PROVIDER_SITE_OTHER): Payer: Self-pay

## 2022-10-05 ENCOUNTER — Encounter: Payer: Self-pay | Admitting: Family Medicine

## 2022-10-05 ENCOUNTER — Other Ambulatory Visit (HOSPITAL_COMMUNITY): Payer: Self-pay | Admitting: Nephrology

## 2022-10-05 DIAGNOSIS — I129 Hypertensive chronic kidney disease with stage 1 through stage 4 chronic kidney disease, or unspecified chronic kidney disease: Secondary | ICD-10-CM | POA: Diagnosis not present

## 2022-10-05 DIAGNOSIS — N390 Urinary tract infection, site not specified: Secondary | ICD-10-CM | POA: Diagnosis not present

## 2022-10-05 DIAGNOSIS — N1832 Chronic kidney disease, stage 3b: Secondary | ICD-10-CM | POA: Diagnosis not present

## 2022-10-05 DIAGNOSIS — N189 Chronic kidney disease, unspecified: Secondary | ICD-10-CM | POA: Diagnosis not present

## 2022-10-05 DIAGNOSIS — E559 Vitamin D deficiency, unspecified: Secondary | ICD-10-CM

## 2022-10-05 DIAGNOSIS — D631 Anemia in chronic kidney disease: Secondary | ICD-10-CM | POA: Diagnosis not present

## 2022-10-06 LAB — LAB REPORT - SCANNED
Creatinine, POC: 16.4 mg/dL
EGFR: 51

## 2022-10-08 ENCOUNTER — Other Ambulatory Visit: Payer: Self-pay | Admitting: Family Medicine

## 2022-10-10 NOTE — Telephone Encounter (Signed)
Are we still holding lisinopril for kidney function?

## 2022-10-20 ENCOUNTER — Encounter: Payer: Self-pay | Admitting: Family Medicine

## 2022-10-20 ENCOUNTER — Ambulatory Visit (INDEPENDENT_AMBULATORY_CARE_PROVIDER_SITE_OTHER): Payer: 59 | Admitting: Family Medicine

## 2022-10-20 VITALS — BP 114/82 | HR 59 | Temp 97.6°F | Ht 68.0 in

## 2022-10-20 DIAGNOSIS — E781 Pure hyperglyceridemia: Secondary | ICD-10-CM

## 2022-10-20 DIAGNOSIS — I110 Hypertensive heart disease with heart failure: Secondary | ICD-10-CM | POA: Diagnosis not present

## 2022-10-20 DIAGNOSIS — E1121 Type 2 diabetes mellitus with diabetic nephropathy: Secondary | ICD-10-CM

## 2022-10-20 DIAGNOSIS — K529 Noninfective gastroenteritis and colitis, unspecified: Secondary | ICD-10-CM

## 2022-10-20 DIAGNOSIS — E785 Hyperlipidemia, unspecified: Secondary | ICD-10-CM | POA: Diagnosis not present

## 2022-10-20 DIAGNOSIS — E1169 Type 2 diabetes mellitus with other specified complication: Secondary | ICD-10-CM | POA: Diagnosis not present

## 2022-10-20 DIAGNOSIS — I509 Heart failure, unspecified: Secondary | ICD-10-CM

## 2022-10-20 DIAGNOSIS — K7460 Unspecified cirrhosis of liver: Secondary | ICD-10-CM | POA: Diagnosis not present

## 2022-10-20 DIAGNOSIS — Z905 Acquired absence of kidney: Secondary | ICD-10-CM | POA: Diagnosis not present

## 2022-10-20 DIAGNOSIS — M8000XA Age-related osteoporosis with current pathological fracture, unspecified site, initial encounter for fracture: Secondary | ICD-10-CM

## 2022-10-20 LAB — LIPID PANEL
Cholesterol: 113 mg/dL (ref 0–200)
HDL: 34.1 mg/dL — ABNORMAL LOW (ref >39.00)
LDL Cholesterol: 13 mg/dL (ref 0–99)
NonHDL: 79.31
Total CHOL/HDL Ratio: 3
Triglycerides: 334 mg/dL — ABNORMAL HIGH (ref 0.0–149.0)
VLDL: 66.8 mg/dL — ABNORMAL HIGH (ref 0.0–40.0)

## 2022-10-20 LAB — HEMOGLOBIN A1C: Hgb A1c MFr Bld: 6.3 % (ref 4.6–6.5)

## 2022-10-20 MED ORDER — FENOFIBRATE 145 MG PO TABS: 145.0000 mg | ORAL_TABLET | Freq: Every day | ORAL | 0 refills | Status: DC

## 2022-10-20 NOTE — Assessment & Plan Note (Signed)
Appears asymptomatic.  She will continue to avoid alcohol and hepatotoxic medications. Lost to GI follow up. Referral made.

## 2022-10-20 NOTE — Assessment & Plan Note (Signed)
Referral to GI  

## 2022-10-20 NOTE — Assessment & Plan Note (Signed)
Euvolemic.  Asymptomatic.  Continue current medication regimen

## 2022-10-20 NOTE — Assessment & Plan Note (Signed)
Managed by nephrology. 

## 2022-10-20 NOTE — Assessment & Plan Note (Signed)
Uncontrolled on medications.  Referred to lipid clinic

## 2022-10-20 NOTE — Assessment & Plan Note (Signed)
HTN controlled

## 2022-10-20 NOTE — Assessment & Plan Note (Signed)
Continue current medication and low fat diet. Referred to lipid clinic.

## 2022-10-20 NOTE — Assessment & Plan Note (Signed)
Check A1c. BS at home fairly controlled ranges. Continue current therapies.

## 2022-10-20 NOTE — Progress Notes (Signed)
Subjective:     Patient ID: Nicole [name], female    DOB: April 25, 1950, 72 y.o.   MRN: [id number]  Chief Complaint  Patient presents with   Medical Management of Chronic Issues    3 month f/u, having some diabetic neuropathy pain in right foot  Wants referral for hip surgery, not sure of her name but wants the same girl she saw in [facility] when she had knee surgery     HPI  Discussed the use of AI scribe software for clinical note transcription with the patient, who gave verbal consent to proceed.  History of Present Illness          Here to follow up on chronic health conditions.   She saw Dr. Allena Katz, nephrology.   Last DEXA in 04/2021  Last mammogram 03/2022 with [facility]  Imaging Results - Mammo Digital Screening W Tomo Bilateral W CAD (04/07/2022 4:36 PM EST) Procedure Note  Sherian Rein, MD - 04/12/2022  Formatting of this note might be different from the original. CLINICAL DATA: Screening.  EXAM: DIGITAL SCREENING BILATERAL MAMMOGRAM WITH TOMOSYNTHESIS AND CAD  TECHNIQUE: Bilateral screening digital craniocaudal and mediolateral oblique mammograms were obtained. Bilateral screening digital breast tomosynthesis was performed. The images were evaluated with computer-aided detection.  COMPARISON: None available.  ACR Breast Density Category b: There are scattered areas of fibroglandular density.  FINDINGS: There are no findings suspicious for malignancy.  IMPRESSION: No mammographic evidence of malignancy. A result letter of this screening mammogram will be mailed directly to the patient.  RECOMMENDATION: Screening mammogram in one year. (Code:[id number]-B-01Y)  BI-RADS CATEGORY 1: Negative.   Electronically Signed By: Sherian Rein M.D. On: 04/12/2022 10:16         Health Maintenance Due  Topic Date Due   FOOT EXAM  Never done   Zoster Vaccines- Shingrix (1 of 2) Never done   MAMMOGRAM  Never done   DEXA SCAN  Never done   Pneumonia Vaccine 39+  Years old (2 of 2 - PCV) 01/05/2018   INFLUENZA VACCINE  09/15/2022    Past Medical History:  Diagnosis Date   Anemia    Arthritis    Asthma    as a child   Atrial fibrillation with RVR (HCC)    Cancer (HCC)    right kidney   Chronic alcoholism (HCC)    Chronic left shoulder pain    Dysrhythmia    Atrial Fib with RVR    GERD (gastroesophageal reflux disease)    Heartburn    Hiatal hernia 12/17/2016   stomach noted in chest on CT scan   History of blood transfusion    Hypertension    Memory loss    Schizophrenia (HCC)    Seizures (HCC)     had when she had period and when she ovalate   Thyroid nodule     Past Surgical History:  Procedure Laterality Date   ANKLE FRACTURE SURGERY Right 2008   screws   ANKLE FRACTURE SURGERY Left 2008   plates   ESOPHAGOGASTRODUODENOSCOPY (EGD) WITH PROPOFOL N/A 12/18/2016   Procedure: ESOPHAGOGASTRODUODENOSCOPY (EGD) WITH PROPOFOL;  Surgeon: Charlott Rakes, MD;  Location: [facility] ENDOSCOPY;  Service: Endoscopy;  Laterality: N/A;   NASAL SINUS SURGERY Left 11/17/2017   Procedure: ENDOSCOPIC SINUS approach;  Surgeon: Christia Reading, MD;  Location: [facility] OR;  Service: ENT;  Laterality: Left;   NEPHRECTOMY Right    robotic riight radical    ORIF ORBITAL FRACTURE Left 11/17/2017   Procedure: Trans Antral Left sided  orbital floor fracture repair with dissovable floor implant;  Surgeon: Christia Reading, MD;  Location: [facility] OR;  Service: ENT;  Laterality: Left;    Family History  Problem Relation Age of Onset   Stroke Mother    Heart attack Father    Ulcers Father    Diabetes Sister    Cancer Brother    Diabetes Sister    Diabetes Sister     Social History   Socioeconomic History   Marital status: Divorced    Spouse name: Not on file   Number of children: Not on file   Years of education: Not on file   Highest education level: Associate degree: academic program  Occupational History   Not on file  Tobacco Use   Smoking status: Former     Current packs/day: 0.00    Types: Cigarettes    Start date: 11/13/1992    Quit date: 11/14/2007    Years since quitting: 14.9   Smokeless tobacco: Never  Vaping Use   Vaping status: Never Used  Substance and Sexual Activity   Alcohol use: Not Currently    Comment: none since fall 11/13/17   Drug use: No   Sexual activity: Not Currently  Other Topics Concern   Not on file  Social History Narrative   quit drinking in late August but went on a binge over last weekend (october 26, 26, 28).  Hasn't drank since Sunday (6 days ago) due to abd pain, N, V   Social Determinants of Health   Financial Resource Strain: Low Risk  (08/09/2022)   Overall Financial Resource Strain (CARDIA)    Difficulty of Paying Living Expenses: Not hard at all  Food Insecurity: No Food Insecurity (08/09/2022)   Hunger Vital Sign    Worried About Running Out of Food in the Last Year: Never true    Ran Out of Food in the Last Year: Never true  Transportation Needs: No Transportation Needs (08/09/2022)   PRAPARE - Administrator, Civil Service (Medical): No    Lack of Transportation (Non-Medical): No  Physical Activity: Inactive (08/09/2022)   Exercise Vital Sign    Days of Exercise per Week: 0 days    Minutes of Exercise per Session: 0 min  Stress: No Stress Concern Present (08/09/2022)   [employer] of Occupational Health - Occupational Stress Questionnaire    Feeling of Stress : Not at all  Recent Concern: Stress - Stress Concern Present (06/05/2022)   [employer] of Occupational Health - Occupational Stress Questionnaire    Feeling of Stress : Very much  Social Connections: Socially Isolated (08/09/2022)   Social Connection and Isolation Panel [NHANES]    Frequency of Communication with Friends and Family: More than three times a week    Frequency of Social Gatherings with Friends and Family: More than three times a week    Attends Religious Services: Never    Database administrator or  Organizations: No    Attends Banker Meetings: Never    Marital Status: Divorced  Catering manager Violence: Not At Risk (08/09/2022)   Humiliation, Afraid, Rape, and Kick questionnaire    Fear of Current or Ex-Partner: No    Emotionally Abused: No    Physically Abused: No    Sexually Abused: No    Outpatient Medications Prior to Visit  Medication Sig Dispense Refill   acetaminophen (TYLENOL) 500 MG tablet Take 500 mg by mouth 2 (two) times daily.     b  complex vitamins capsule Take 1 capsule by mouth daily.     Blood Glucose Monitoring Suppl DEVI 1 each by Does not apply route in the morning, at noon, and at bedtime. May substitute to any manufacturer covered by patient's insurance. 1 each 0   Calcium Carbonate (CALCIUM 600 PO) Take by mouth in the morning and at bedtime.     diclofenac Sodium (VOLTAREN) 1 % GEL      digoxin (LANOXIN) 0.125 MG tablet Take 1 tablet (125 mcg total) by mouth daily. 90 tablet 0   ELIQUIS 5 MG TABS tablet Take 1 tablet (5 mg total) by mouth 2 (two) times daily. 180 tablet 0   Fexofenadine HCl (ALLERGY 24-HR PO) Take by mouth.     Glucose Blood (BLOOD GLUCOSE TEST STRIPS) STRP 1 each by In Vitro route in the morning, at noon, and at bedtime. May substitute to any manufacturer covered by patient's insurance. 100 strip 3   lidocaine 4 % Place 1 patch onto the skin as needed.     lisinopril (ZESTRIL) 2.5 MG tablet TAKE 1 TABLET(2.5 MG) BY MOUTH DAILY 90 tablet 1   loperamide (IMODIUM) 2 MG capsule Take by mouth as needed for diarrhea or loose stools.     Magnesium 250 MG TABS Take 2 tablets by mouth daily.     metFORMIN (GLUCOPHAGE) 500 MG tablet Take 1,000 mg (2 tablets) with breakfast and 500 mg (1 tablet) with evening meal. 270 tablet 1   metoprolol tartrate (LOPRESSOR) 50 MG tablet Take 1 tablet (50 mg total) by mouth 2 (two) times daily. 180 tablet 0   mirtazapine (REMERON) 15 MG tablet Take 1 tablet (15 mg total) by mouth at bedtime. 90 tablet 0    Omega-3 Fatty Acids (FISH OIL PO) Take 1,200 mg by mouth daily.     Potassium 99 MG TABS Take by mouth daily.     Propylene Glycol (SYSTANE COMPLETE) 0.6 % SOLN Place 1 drop into both eyes See admin instructions. Instill one drop into both eyes every morning, may also use later in the day as needed for dry eyes     rosuvastatin (CRESTOR) 5 MG tablet TAKE 1 TABLET BY MOUTH DAILY 90 tablet 1   Turmeric (QC TUMERIC COMPLEX PO) Take 1,000 mg by mouth in the morning and at bedtime.     UNABLE TO FIND Med Name: Gynovite Plus     XIFAXAN 550 MG TABS tablet TAKE 1 TABLET BY MOUTH 2 TIMES DAILY. 60 tablet 2   fenofibrate (TRICOR) 145 MG tablet Take 145 mg by mouth daily. (Patient not taking: Reported on 10/20/2022)     No facility-administered medications prior to visit.    Allergies  Allergen Reactions   Other Shortness Of Breath and Anxiety    UNSPECIFIED REACTION to "fast foods and processed food"   Shellfish Allergy Shortness Of Breath   Iodinated Contrast Media Swelling    Hand swelling from contrast dye for MRI    Review of Systems  Constitutional:  Negative for chills and fever.  Respiratory:  Negative for shortness of breath.   Cardiovascular:  Negative for chest pain, palpitations and leg swelling.  Gastrointestinal:  Negative for abdominal pain, constipation, diarrhea, nausea and vomiting.  Genitourinary:  Negative for dysuria, frequency and urgency.  Neurological:  Negative for dizziness and focal weakness.       Objective:    Physical Exam Constitutional:      General: She is not in acute distress.    Appearance:  She is not ill-appearing.  Eyes:     Extraocular Movements: Extraocular movements intact.     Conjunctiva/sclera: Conjunctivae normal.  Cardiovascular:     Rate and Rhythm: Normal rate.  Pulmonary:     Effort: Pulmonary effort is normal.  Musculoskeletal:     Cervical back: Normal range of motion and neck supple.  Skin:    General: Skin is warm and dry.   Neurological:     General: No focal deficit present.     Mental Status: She is alert and oriented to person, place, and time.  Psychiatric:        Mood and Affect: Mood normal.        Behavior: Behavior normal.        Thought Content: Thought content normal.      BP 114/82 (BP Location: Left Arm, Patient Position: Sitting, Cuff Size: Large)   Pulse (!) 59   Temp 97.6 F (36.4 C) (Temporal)   Ht 5\' 8"  (1.727 m)   SpO2 98%   BMI 24.33 kg/m  Wt Readings from Last 3 Encounters:  02/03/22 160 lb (72.6 kg)  06/22/20 134 lb (60.8 kg)  06/20/20 135 lb (61.2 kg)       Assessment & Plan:   Problem List Items Addressed This Visit       Cardiovascular and Mediastinum   Chronic heart failure (HCC)    Euvolemic.  Asymptomatic.  Continue current medication regimen      Relevant Medications   fenofibrate (TRICOR) 145 MG tablet   Hypertensive heart disease with heart failure (HCC)    HTN controlled.       Relevant Medications   fenofibrate (TRICOR) 145 MG tablet     Digestive   Chronic diarrhea    Referral to GI      Relevant Orders   Ambulatory referral to Gastroenterology   Cirrhosis of liver (HCC)    Appears asymptomatic.  She will continue to avoid alcohol and hepatotoxic medications. Lost to GI follow up. Referral made.       Relevant Orders   Ambulatory referral to Gastroenterology     Endocrine   Diabetes mellitus (HCC) - Primary    Check A1c. BS at home fairly controlled ranges. Continue current therapies.       Relevant Orders   Hemoglobin A1c (Completed)   Diabetic nephropathy associated with type 2 diabetes mellitus (HCC)    Managed by nephrology.       Hyperlipidemia associated with type 2 diabetes mellitus (HCC)    Continue current medication and low fat diet. Referred to lipid clinic.       Relevant Medications   fenofibrate (TRICOR) 145 MG tablet   Other Relevant Orders   Lipid panel (Completed)     Musculoskeletal and Integument    Age-related osteoporosis with current pathological fracture     Other   H/O right nephrectomy   Hypertriglyceridemia    Uncontrolled on medications.  Referred to lipid clinic      Relevant Medications   fenofibrate (TRICOR) 145 MG tablet   Other Relevant Orders   Lipid panel (Completed)    I have changed Rayma Kendrix's fenofibrate. I am also having her maintain her Propylene Glycol, Magnesium, diclofenac Sodium, Omega-3 Fatty Acids (FISH OIL PO), Turmeric (QC TUMERIC COMPLEX PO), Calcium Carbonate (CALCIUM 600 PO), b complex vitamins, UNABLE TO FIND, Potassium, lidocaine, loperamide, Fexofenadine HCl (ALLERGY 24-HR PO), acetaminophen, metFORMIN, Blood Glucose Monitoring Suppl, BLOOD GLUCOSE TEST STRIPS, Eliquis, metoprolol tartrate, digoxin, mirtazapine,  Xifaxan, lisinopril, and rosuvastatin.  Meds ordered this encounter  Medications   fenofibrate (TRICOR) 145 MG tablet    Sig: Take 1 tablet (145 mg total) by mouth daily.    Dispense:  90 tablet    Refill:  0

## 2022-10-21 ENCOUNTER — Other Ambulatory Visit: Payer: Self-pay | Admitting: Family Medicine

## 2022-11-24 ENCOUNTER — Other Ambulatory Visit: Payer: Self-pay | Admitting: Family Medicine

## 2022-11-24 DIAGNOSIS — E1169 Type 2 diabetes mellitus with other specified complication: Secondary | ICD-10-CM

## 2022-11-28 ENCOUNTER — Institutional Professional Consult (permissible substitution) (HOSPITAL_BASED_OUTPATIENT_CLINIC_OR_DEPARTMENT_OTHER): Payer: 59 | Admitting: Internal Medicine

## 2022-11-30 DIAGNOSIS — H25813 Combined forms of age-related cataract, bilateral: Secondary | ICD-10-CM | POA: Diagnosis not present

## 2022-12-09 ENCOUNTER — Other Ambulatory Visit: Payer: Self-pay | Admitting: Family Medicine

## 2023-01-10 DIAGNOSIS — N390 Urinary tract infection, site not specified: Secondary | ICD-10-CM | POA: Diagnosis not present

## 2023-01-10 DIAGNOSIS — N1832 Chronic kidney disease, stage 3b: Secondary | ICD-10-CM | POA: Diagnosis not present

## 2023-01-19 ENCOUNTER — Other Ambulatory Visit: Payer: Self-pay | Admitting: Family Medicine

## 2023-01-19 NOTE — Telephone Encounter (Signed)
I will give her 30 days of the medication. She will need to establish with GI for future refills however. Please ask her to schedule. She has been contacted by [facility] to schedule.

## 2023-01-20 ENCOUNTER — Other Ambulatory Visit: Payer: Self-pay | Admitting: Family Medicine

## 2023-01-20 NOTE — Telephone Encounter (Signed)
LM for pt stating she will need to schedule w GI for future refills of this medication. Advised they have been trying to contact her to schedule and please call them, provided their office number.

## 2023-01-22 ENCOUNTER — Other Ambulatory Visit: Payer: Self-pay | Admitting: Family Medicine

## 2023-01-27 DIAGNOSIS — E559 Vitamin D deficiency, unspecified: Secondary | ICD-10-CM | POA: Diagnosis not present

## 2023-01-27 DIAGNOSIS — I129 Hypertensive chronic kidney disease with stage 1 through stage 4 chronic kidney disease, or unspecified chronic kidney disease: Secondary | ICD-10-CM | POA: Diagnosis not present

## 2023-01-27 DIAGNOSIS — N1832 Chronic kidney disease, stage 3b: Secondary | ICD-10-CM | POA: Diagnosis not present

## 2023-01-27 DIAGNOSIS — N189 Chronic kidney disease, unspecified: Secondary | ICD-10-CM | POA: Diagnosis not present

## 2023-01-27 DIAGNOSIS — D631 Anemia in chronic kidney disease: Secondary | ICD-10-CM | POA: Diagnosis not present

## 2023-02-16 ENCOUNTER — Other Ambulatory Visit: Payer: Self-pay | Admitting: Family Medicine

## 2023-02-16 ENCOUNTER — Telehealth: Payer: Self-pay

## 2023-02-16 NOTE — Telephone Encounter (Signed)
 Sent to provider for review

## 2023-02-16 NOTE — Telephone Encounter (Signed)
 Is this a medication we continue?

## 2023-02-16 NOTE — Telephone Encounter (Signed)
 Copied from CRM [id number]. Topic: Clinical - Medication Refill >> Feb 16, 2023 12:59 PM Rosina BIRCH wrote: Most Recent Primary Care Visit:  Provider: LENDIA BOBY CROME  Department: [facility]  Visit Type: OFFICE VISIT  Date: 10/20/2022  Medication: rifaximin    Has the patient contacted their pharmacy? Yes (Agent: If no, request that the patient contact the pharmacy for the refill. If patient does not wish to contact the pharmacy document the reason why and proceed with request.) (Agent: If yes, when and what did the pharmacy advise?)  Is this the correct pharmacy for this prescription? Yes If no, delete pharmacy and type the correct one.  This is the patient's preferred pharmacy:  [id number] - [place], [place] - [address] [place] [address] Phone: [phone] Fax: [phone]    Has the prescription been filled recently? No  Is the patient out of the medication? No  Has the patient been seen for an appointment in the last year OR does the patient have an upcoming appointment? Yes  Can we respond through MyChart? Yes  Agent: Please be advised that Rx refills may take up to 3 business days. We ask that you follow-up with your pharmacy.

## 2023-02-22 ENCOUNTER — Ambulatory Visit: Payer: 59 | Admitting: Family Medicine

## 2023-02-22 ENCOUNTER — Encounter: Payer: Self-pay | Admitting: Family Medicine

## 2023-02-22 VITALS — BP 122/82 | HR 80 | Temp 97.8°F | Ht 68.0 in

## 2023-02-22 DIAGNOSIS — Z79899 Other long term (current) drug therapy: Secondary | ICD-10-CM | POA: Diagnosis not present

## 2023-02-22 DIAGNOSIS — F1011 Alcohol abuse, in remission: Secondary | ICD-10-CM

## 2023-02-22 DIAGNOSIS — I2510 Atherosclerotic heart disease of native coronary artery without angina pectoris: Secondary | ICD-10-CM

## 2023-02-22 DIAGNOSIS — E781 Pure hyperglyceridemia: Secondary | ICD-10-CM | POA: Diagnosis not present

## 2023-02-22 DIAGNOSIS — I4891 Unspecified atrial fibrillation: Secondary | ICD-10-CM | POA: Diagnosis not present

## 2023-02-22 DIAGNOSIS — E785 Hyperlipidemia, unspecified: Secondary | ICD-10-CM | POA: Diagnosis not present

## 2023-02-22 DIAGNOSIS — I110 Hypertensive heart disease with heart failure: Secondary | ICD-10-CM

## 2023-02-22 DIAGNOSIS — H259 Unspecified age-related cataract: Secondary | ICD-10-CM

## 2023-02-22 DIAGNOSIS — M8000XA Age-related osteoporosis with current pathological fracture, unspecified site, initial encounter for fracture: Secondary | ICD-10-CM

## 2023-02-22 DIAGNOSIS — E1169 Type 2 diabetes mellitus with other specified complication: Secondary | ICD-10-CM | POA: Diagnosis not present

## 2023-02-22 DIAGNOSIS — K7460 Unspecified cirrhosis of liver: Secondary | ICD-10-CM | POA: Diagnosis not present

## 2023-02-22 DIAGNOSIS — M87052 Idiopathic aseptic necrosis of left femur: Secondary | ICD-10-CM

## 2023-02-22 DIAGNOSIS — G40909 Epilepsy, unspecified, not intractable, without status epilepticus: Secondary | ICD-10-CM

## 2023-02-22 DIAGNOSIS — E1121 Type 2 diabetes mellitus with diabetic nephropathy: Secondary | ICD-10-CM

## 2023-02-22 DIAGNOSIS — E559 Vitamin D deficiency, unspecified: Secondary | ICD-10-CM

## 2023-02-22 LAB — CBC WITH DIFFERENTIAL/PLATELET
Basophils Absolute: 0.1 10*3/uL (ref 0.0–0.1)
Basophils Relative: 1.2 % (ref 0.0–3.0)
Eosinophils Absolute: 0.3 10*3/uL (ref 0.0–0.7)
Eosinophils Relative: 3.7 % (ref 0.0–5.0)
HCT: 40.7 % (ref 36.0–46.0)
Hemoglobin: 13.1 g/dL (ref 12.0–15.0)
Lymphocytes Relative: 12.1 % (ref 12.0–46.0)
Lymphs Abs: 0.9 10*3/uL (ref 0.7–4.0)
MCHC: 32.3 g/dL (ref 30.0–36.0)
MCV: 94.1 fL (ref 78.0–100.0)
Monocytes Absolute: 0.5 10*3/uL (ref 0.1–1.0)
Monocytes Relative: 7 % (ref 3.0–12.0)
Neutro Abs: 5.8 10*3/uL (ref 1.4–7.7)
Neutrophils Relative %: 76 % (ref 43.0–77.0)
Platelets: 213 10*3/uL (ref 150.0–400.0)
RBC: 4.32 Mil/uL (ref 3.87–5.11)
RDW: 14.2 % (ref 11.5–15.5)
WBC: 7.6 10*3/uL (ref 4.0–10.5)

## 2023-02-22 LAB — HEMOGLOBIN A1C: Hgb A1c MFr Bld: 6.4 % (ref 4.6–6.5)

## 2023-02-22 LAB — COMPREHENSIVE METABOLIC PANEL
ALT: 17 U/L (ref 0–35)
AST: 20 U/L (ref 0–37)
Albumin: 4.6 g/dL (ref 3.5–5.2)
Alkaline Phosphatase: 51 U/L (ref 39–117)
BUN: 30 mg/dL — ABNORMAL HIGH (ref 6–23)
CO2: 26 meq/L (ref 19–32)
Calcium: 10 mg/dL (ref 8.4–10.5)
Chloride: 103 meq/L (ref 96–112)
Creatinine, Ser: 1.3 mg/dL — ABNORMAL HIGH (ref 0.40–1.20)
GFR: 40.65 mL/min — ABNORMAL LOW (ref >60.00)
Glucose, Bld: 113 mg/dL — ABNORMAL HIGH (ref 70–99)
Potassium: 5.1 meq/L (ref 3.5–5.1)
Sodium: 140 meq/L (ref 135–145)
Total Bilirubin: 0.3 mg/dL (ref 0.2–1.2)
Total Protein: 7 g/dL (ref 6.0–8.3)

## 2023-02-22 LAB — VITAMIN D 25 HYDROXY (VIT D DEFICIENCY, FRACTURES): VITD: 36.37 ng/mL (ref 30.00–100.00)

## 2023-02-22 MED ORDER — RIFAXIMIN 550 MG PO TABS: 550.0000 mg | ORAL_TABLET | Freq: Two times a day (BID) | ORAL | 1 refills | Status: DC

## 2023-02-22 NOTE — Progress Notes (Signed)
 Subjective:     Patient ID: Nicole [name], female    DOB: 1950-12-28, 73 y.o.   MRN: [id number]  Chief Complaint  Patient presents with   Medical Management of Chronic Issues    4 month f/u    HPI   History of Present Illness         Here for 3 month follow up on chronic health conditions.   She saw nephrologist, Dr. Gordy Blanch, on 01/27/2023  DM- A1c 6.3% 10/2022  Xifaxin - states she has been out of the medication for the past 3 days.   Digoxin  level has not been checked   Dr. Mona is her cardiologist   Last mammogram in [place]  Declines to have breast cancer screening. States she would not treat it.   Declines colon cancer screening.   Focal seizure hx- last seizure less than a year ago. She is not on medication and refuses medication or referral to neurology. Does not drive.  Triggers are caffeine per patient     Health Maintenance Due  Topic Date Due   FOOT EXAM  Never done   Zoster Vaccines- Shingrix (1 of 2) Never done   Colonoscopy  Never done   MAMMOGRAM  Never done   DEXA SCAN  Never done   Pneumonia Vaccine 63+ Years old (2 of 2 - PCV) 01/05/2018    Past Medical History:  Diagnosis Date   Anemia    Arthritis    Asthma    as a child   Atrial fibrillation with RVR (HCC)    Cancer (HCC)    right kidney   Chronic alcoholism (HCC)    Chronic left shoulder pain    Dysrhythmia    Atrial Fib with RVR    GERD (gastroesophageal reflux disease)    Heartburn    Hiatal hernia 12/17/2016   stomach noted in chest on CT scan   History of blood transfusion    Hypertension    Memory loss    Schizophrenia (HCC)    Seizures (HCC)     had when she had period and when she ovalate   Thyroid  nodule     Past Surgical History:  Procedure Laterality Date   ANKLE FRACTURE SURGERY Right 2008   screws   ANKLE FRACTURE SURGERY Left 2008   plates   ESOPHAGOGASTRODUODENOSCOPY (EGD) WITH PROPOFOL  N/A 12/18/2016   Procedure: ESOPHAGOGASTRODUODENOSCOPY  (EGD) WITH PROPOFOL ;  Surgeon: Dianna Specking, MD;  Location[facility] ENDOSCOPY;  Service: Endoscopy;  Laterality: N/A;   NASAL SINUS SURGERY Left 11/17/2017   Procedure: ENDOSCOPIC SINUS approach;  Surgeon: Carlie Clark, MD;  Location: [facility] OR;  Service: ENT;  Laterality: Left;   NEPHRECTOMY Right    robotic riight radical    ORIF ORBITAL FRACTURE Left 11/17/2017   Procedure: Trans Antral Left sided orbital floor fracture repair with dissovable floor implant;  Surgeon: Carlie Clark, MD;  Location: [facility] OR;  Service: ENT;  Laterality: Left;    Family History  Problem Relation Age of Onset   Stroke Mother    Heart attack Father    Ulcers Father    Diabetes Sister    Cancer Brother    Diabetes Sister    Diabetes Sister     Social History   Socioeconomic History   Marital status: Divorced    Spouse name: Not on file   Number of children: Not on file   Years of education: Not on file   Highest education level: Associate degree: academic program  Occupational History   Not on file  Tobacco Use   Smoking status: Former    Current packs/day: 0.00    Types: Cigarettes    Start date: 11/13/1992    Quit date: 11/14/2007    Years since quitting: 15.2   Smokeless tobacco: Never  Vaping Use   Vaping status: Never Used  Substance and Sexual Activity   Alcohol  use: Not Currently    Comment: none since fall 11/13/17   Drug use: No   Sexual activity: Not Currently  Other Topics Concern   Not on file  Social History Narrative   quit drinking in late August but went on a binge over last weekend (october 26, 26, 28).  Hasn't drank since Sunday (6 days ago) due to abd pain, N, V   Social Drivers of Health   Financial Resource Strain: Low Risk  (02/18/2023)   Overall Financial Resource Strain (CARDIA)    Difficulty of Paying Living Expenses: Not very hard  Food Insecurity: No Food Insecurity (02/18/2023)   Hunger Vital Sign    Worried About Running Out of Food in the Last Year: Never true     Ran Out of Food in the Last Year: Never true  Transportation Needs: Unmet Transportation Needs (02/18/2023)   PRAPARE - Administrator, Civil Service (Medical): Yes    Lack of Transportation (Non-Medical): No  Physical Activity: Insufficiently Active (02/18/2023)   Exercise Vital Sign    Days of Exercise per Week: 1 day    Minutes of Exercise per Session: 20 min  Stress: Stress Concern Present (02/18/2023)   [employer] of Occupational Health - Occupational Stress Questionnaire    Feeling of Stress : To some extent  Social Connections: Socially Isolated (02/18/2023)   Social Connection and Isolation Panel [NHANES]    Frequency of Communication with Friends and Family: More than three times a week    Frequency of Social Gatherings with Friends and Family: Never    Attends Religious Services: Never    Database Administrator or Organizations: No    Attends Banker Meetings: Never    Marital Status: Divorced  Catering Manager Violence: Not At Risk (08/09/2022)   Humiliation, Afraid, Rape, and Kick questionnaire    Fear of Current or Ex-Partner: No    Emotionally Abused: No    Physically Abused: No    Sexually Abused: No    Outpatient Medications Prior to Visit  Medication Sig Dispense Refill   ACCU-CHEK GUIDE test strip USE ONE STRIP IN THE MORNING, AT NOON, AND AT BEDTIME 100 strip 3   Accu-Chek Softclix Lancets lancets USE ONE LANCET IN THE MORNING, AT NOON, AND AT BEDTIME 100 each 3   acetaminophen  (TYLENOL ) 500 MG tablet Take 500 mg by mouth 2 (two) times daily.     b complex vitamins capsule Take 1 capsule by mouth daily.     Blood Glucose Monitoring Suppl DEVI 1 each by Does not apply route in the morning, at noon, and at bedtime. May substitute to any manufacturer covered by patient's insurance. 1 each 0   Calcium  Carbonate (CALCIUM  600 PO) Take by mouth in the morning and at bedtime.     diclofenac Sodium (VOLTAREN) 1 % GEL      digoxin  (LANOXIN ) 0.125 MG  tablet TAKE 1 TABLET(125 MCG) BY MOUTH DAILY 90 tablet 0   ELIQUIS  5 MG TABS tablet TAKE 1 TABLET(5 MG) BY MOUTH TWICE DAILY 180 tablet 0   fenofibrate  (TRICOR )  145 MG tablet TAKE 1 TABLET(145 MG) BY MOUTH DAILY 90 tablet 0   Fexofenadine HCl (ALLERGY 24-HR PO) Take by mouth.     lidocaine  4 % Place 1 patch onto the skin as needed.     lisinopril  (ZESTRIL ) 2.5 MG tablet TAKE 1 TABLET(2.5 MG) BY MOUTH DAILY 90 tablet 1   loperamide (IMODIUM) 2 MG capsule Take by mouth as needed for diarrhea or loose stools.     Magnesium  250 MG TABS Take 2 tablets by mouth daily.     metoprolol  tartrate (LOPRESSOR ) 50 MG tablet TAKE 1 TABLET(50 MG) BY MOUTH TWICE DAILY 180 tablet 1   mirtazapine  (REMERON ) 15 MG tablet TAKE 1 TABLET(15 MG) BY MOUTH AT BEDTIME 90 tablet 0   Omega-3 Fatty Acids (FISH OIL PO) Take 1,200 mg by mouth daily.     Potassium 99 MG TABS Take by mouth daily.     Propylene Glycol (SYSTANE COMPLETE) 0.6 % SOLN Place 1 drop into both eyes See admin instructions. Instill one drop into both eyes every morning, may also use later in the day as needed for dry eyes     rosuvastatin (CRESTOR) 5 MG tablet TAKE 1 TABLET BY MOUTH DAILY 90 tablet 1   Turmeric (QC TUMERIC COMPLEX PO) Take 1,000 mg by mouth in the morning and at bedtime.     UNABLE TO FIND Med Name: Gynovite Plus     metFORMIN  (GLUCOPHAGE ) 500 MG tablet TAKE 2 TABLETS BY MOUTH WITH BREAKFAST AND 1 TABLET WITH EVENING MEAL 270 tablet 1   XIFAXAN  550 MG TABS tablet TAKE 1 TABLET BY MOUTH 2 TIMES DAILY. 60 tablet 0   No facility-administered medications prior to visit.    Allergies  Allergen Reactions   Other Shortness Of Breath and Anxiety    UNSPECIFIED REACTION to fast foods and processed food   Shellfish Allergy Shortness Of Breath   Iodinated Contrast Media Swelling    Hand swelling from contrast dye for MRI    Review of Systems  Constitutional:  Negative for chills, fever, malaise/fatigue and weight loss.  Respiratory:   Negative for cough and shortness of breath.   Cardiovascular:  Negative for chest pain, palpitations and leg swelling.  Gastrointestinal:  Negative for abdominal pain, constipation, diarrhea, nausea and vomiting.  Genitourinary:  Negative for dysuria, frequency and urgency.  Musculoskeletal:  Positive for joint pain. Negative for falls.  Neurological:  Negative for dizziness, focal weakness and headaches.  Psychiatric/Behavioral:  Negative for depression and suicidal ideas. The patient is not nervous/anxious.        Objective:    Physical Exam Constitutional:      General: She is not in acute distress.    Appearance: She is not ill-appearing.     Comments: Sitting in wheelchair   HENT:     Mouth/Throat:     Mouth: Mucous membranes are moist.  Eyes:     Extraocular Movements: Extraocular movements intact.     Conjunctiva/sclera: Conjunctivae normal.  Cardiovascular:     Rate and Rhythm: Normal rate. Rhythm irregular.  Pulmonary:     Effort: Pulmonary effort is normal.  Skin:    General: Skin is warm and dry.  Neurological:     Mental Status: She is oriented to person, place, and time.     Cranial Nerves: No cranial nerve deficit.     Motor: No weakness.  Psychiatric:        Mood and Affect: Mood normal.  Behavior: Behavior normal.        Thought Content: Thought content normal.      BP 122/82 (BP Location: Left Arm, Patient Position: Sitting)   Pulse 80   Temp 97.8 F (36.6 C) (Temporal)   Ht 5' 8 (1.727 m)   SpO2 98%   BMI 24.33 kg/m  Wt Readings from Last 3 Encounters:  02/03/22 160 lb (72.6 kg)  06/22/20 134 lb (60.8 kg)  06/20/20 135 lb (61.2 kg)       Assessment & Plan:   Problem List Items Addressed This Visit     Age-related cataract of both eyes   Age-related osteoporosis with current pathological fracture - Primary   Unknown last bone density.  Will attempt to find this result.  Ordered DEXA.  For now she will continue getting adequate  calcium  and vitamin D .  Unfortunately, due to her hip, she is unable to do significant weightbearing exercises      Relevant Orders   DG Bone Density   Athscl heart disease of native coronary artery w/o ang pctrs   Relevant Orders   Ambulatory referral to Cardiology   Atrial fibrillation with rapid ventricular response (HCC)   Rate controlled. Asymptomatic. On Eliquis . She is sedentary, wheelchair dependent.  Check digoxin  level. She will follow up with cardiologist.       Relevant Orders   Digoxin  level (Completed)   Ambulatory referral to Cardiology   Avascular necrosis of bone of left hip [facility])   Reviewed Dr. [name] notes. She may follow up with [facility]      Cirrhosis of liver (HCC)   Appears asymptomatic.  She will continue to avoid alcohol  and hepatotoxic medications. Lost to GI follow up. She will call to schedule.  Appreciate GI recommendation regarding whether she needs to continue Xifaxin.       Relevant Orders   Comprehensive metabolic panel (Completed)   Diabetes mellitus (HCC)   Relevant Orders   CBC with Differential/Platelet (Completed)   Comprehensive metabolic panel (Completed)   Hemoglobin A1c (Completed)   Diabetic nephropathy associated with type 2 diabetes mellitus (HCC)   Managed by nephrology.       History of ETOH abuse   No alcohol  in 3 years.       Hyperlipidemia associated with type 2 diabetes mellitus (HCC)   Continue current medication and low fat diet. Referred to lipid clinic.       Hypertensive heart disease with heart failure (HCC)   Relevant Orders   CBC with Differential/Platelet (Completed)   Comprehensive metabolic panel (Completed)   Ambulatory referral to Cardiology   Hypertriglyceridemia   Uncontrolled on medications.  Referred to lipid clinic      Relevant Orders   Ambulatory referral to Cardiology   Seizure disorder [facility])   Refuses neurology referral and does not want to be on medication. Last seizure in summer 2024.  She does not drive.       Vitamin D  deficiency   Check vitamin D  level and follow up      Relevant Orders   VITAMIN D  25 Hydroxy (Vit-D Deficiency, Fractures) (Completed)   Other Visit Diagnoses       High risk medication use       Relevant Orders   Digoxin  level (Completed)       Declines preventive health care including mammogram and colon cancer screening. States she would not choose to treat cancer if she found out she had it.   I have changed Nicole  Delafuente Carole's Xifaxan  to rifaximin . I am also having her maintain her Propylene Glycol, Magnesium , diclofenac Sodium, Omega-3 Fatty Acids (FISH OIL PO), Turmeric (QC TUMERIC COMPLEX PO), Calcium  Carbonate (CALCIUM  600 PO), b complex vitamins, UNABLE TO FIND, Potassium, lidocaine , loperamide, Fexofenadine HCl (ALLERGY 24-HR PO), acetaminophen , Blood Glucose Monitoring Suppl, lisinopril , rosuvastatin, Accu-Chek Softclix Lancets, Accu-Chek Guide, digoxin , mirtazapine , fenofibrate , Eliquis , and metoprolol  tartrate.  Meds ordered this encounter  Medications   rifaximin  (XIFAXAN ) 550 MG TABS tablet    Sig: Take 1 tablet (550 mg total) by mouth 2 (two) times daily.    Dispense:  60 tablet    Refill:  1    Supervising Provider:   ROLLENE NORRIS A [[id number]]

## 2023-02-22 NOTE — Patient Instructions (Signed)
 Please call to schedule with [facility] as discussed. [phone]  Call to schedule with Dr. [name] at [facility] [phone]  Call to schedule your bone density test at [facility] [phone]  I am referring you to [facility] and they will call you to schedule.

## 2023-02-23 ENCOUNTER — Telehealth: Payer: Self-pay | Admitting: Family Medicine

## 2023-02-23 LAB — DIGOXIN LEVEL: Digoxin Level: 0.5 ug/L — ABNORMAL LOW (ref 0.8–2.0)

## 2023-02-23 NOTE — Telephone Encounter (Signed)
 FWI Copied from CRM [id number]. Topic: General - Other >> Feb 23, 2023  2:52 PM [name] wrote: Reason for CRM: PT called to send message [name], Nicole BRAVO, CMA pt stated: She scheduled her appt with Nicole Dozier Maxcy, MD for  05/01/23   [name] KYM [name] - Nicole [name] for 03/17/23  Pt called Dr. [name] and left a message for them to give her a callback  She also stated she had previously had a bone density scan within the last year or so, so she called the records dept at [facility] in [place] where it was performed and left a message with them to fax over her records to the clinic  Pt will call back with any updates

## 2023-02-23 NOTE — Telephone Encounter (Signed)
 Noted, seen by provider and she is glad pt was able to call and get this all done

## 2023-02-24 ENCOUNTER — Telehealth: Payer: Self-pay

## 2023-02-24 ENCOUNTER — Other Ambulatory Visit (HOSPITAL_COMMUNITY): Payer: Self-pay

## 2023-02-24 ENCOUNTER — Other Ambulatory Visit: Payer: Self-pay | Admitting: Family Medicine

## 2023-02-24 DIAGNOSIS — E1169 Type 2 diabetes mellitus with other specified complication: Secondary | ICD-10-CM

## 2023-02-24 NOTE — Assessment & Plan Note (Signed)
Check vitamin D level and follow up 

## 2023-02-24 NOTE — Assessment & Plan Note (Signed)
 Refuses neurology referral and does not want to be on medication. Last seizure in summer 2024. She does not drive.

## 2023-02-24 NOTE — Assessment & Plan Note (Signed)
 No alcohol in 3 years.

## 2023-02-24 NOTE — Assessment & Plan Note (Signed)
Uncontrolled on medications.  Referred to lipid clinic

## 2023-02-24 NOTE — Assessment & Plan Note (Signed)
 Managed by nephrology.

## 2023-02-24 NOTE — Assessment & Plan Note (Signed)
 Rate controlled. Asymptomatic. On Eliquis. She is sedentary, wheelchair dependent.  Check digoxin level. She will follow up with cardiologist.

## 2023-02-24 NOTE — Assessment & Plan Note (Signed)
 Reviewed Dr. Ophelia Charter notes. She may follow up with [facility]

## 2023-02-24 NOTE — Assessment & Plan Note (Signed)
 Appears asymptomatic.  She will continue to avoid alcohol and hepatotoxic medications. Lost to GI follow up. She will call to schedule.  Appreciate GI recommendation regarding whether she needs to continue Xifaxin.

## 2023-02-24 NOTE — Assessment & Plan Note (Signed)
Continue current medication and low fat diet. Referred to lipid clinic.

## 2023-02-24 NOTE — Telephone Encounter (Signed)
*  Primary  Pharmacy Patient Advocate Encounter   Received notification from CoverMyMeds that prior authorization for Xifaxan  550MG  tablets  is required/requested.   Insurance verification completed.   The patient is insured through [facility] [employer] [facility] [employer] .   Per test claim: PA required; PA submitted to above mentioned insurance via CoverMyMeds Key/confirmation #/EOC [id number] Status is pending

## 2023-02-24 NOTE — Assessment & Plan Note (Signed)
 Unknown last bone density.  Will attempt to find this result.  Ordered DEXA.  For now she will continue getting adequate calcium and vitamin D.  Unfortunately, due to her hip, she is unable to do significant weightbearing exercises

## 2023-02-28 ENCOUNTER — Other Ambulatory Visit (HOSPITAL_COMMUNITY): Payer: Self-pay

## 2023-02-28 NOTE — Telephone Encounter (Signed)
 Pharmacy Patient Advocate Encounter  Received notification from [employer] that Prior Authorization for Xifaxan   has been APPROVED from 02/24/2023 to 02/14/2024. Unable to obtain price due to refill too soon rejection, last fill date 02/24/2023 next available fill date 03/19/2023

## 2023-03-17 ENCOUNTER — Other Ambulatory Visit: Payer: Self-pay | Admitting: Gastroenterology

## 2023-03-17 ENCOUNTER — Telehealth: Payer: Self-pay | Admitting: Family Medicine

## 2023-03-17 DIAGNOSIS — K7030 Alcoholic cirrhosis of liver without ascites: Secondary | ICD-10-CM

## 2023-03-17 NOTE — Telephone Encounter (Signed)
Pt requesting abx for UTI

## 2023-03-17 NOTE — Telephone Encounter (Signed)
Copied from CRM [id number]. Topic: Clinical - Medication Question >> Mar 17, 2023  3:34 PM Sonny Dandy B wrote: Reason for CRM: pt  called stating she has a UTI and would like the provider to call in an Antibiotic for her . Please call pt back at  [phone]

## 2023-03-20 NOTE — Telephone Encounter (Signed)
LM for pt w PCP response

## 2023-03-23 DIAGNOSIS — Z1212 Encounter for screening for malignant neoplasm of rectum: Secondary | ICD-10-CM | POA: Diagnosis not present

## 2023-03-23 DIAGNOSIS — Z1211 Encounter for screening for malignant neoplasm of colon: Secondary | ICD-10-CM | POA: Diagnosis not present

## 2023-04-01 ENCOUNTER — Other Ambulatory Visit: Payer: Self-pay | Admitting: Family Medicine

## 2023-04-12 ENCOUNTER — Other Ambulatory Visit: Payer: Self-pay | Admitting: Family Medicine

## 2023-04-18 ENCOUNTER — Other Ambulatory Visit (INDEPENDENT_AMBULATORY_CARE_PROVIDER_SITE_OTHER)

## 2023-04-18 ENCOUNTER — Ambulatory Visit (INDEPENDENT_AMBULATORY_CARE_PROVIDER_SITE_OTHER): Payer: 59 | Admitting: Orthopaedic Surgery

## 2023-04-18 DIAGNOSIS — M87052 Idiopathic aseptic necrosis of left femur: Secondary | ICD-10-CM

## 2023-04-18 DIAGNOSIS — M25552 Pain in left hip: Secondary | ICD-10-CM

## 2023-04-18 NOTE — Progress Notes (Signed)
 Office Visit Note   Patient: Nicole [name]           Date of Birth: 1950/11/07           MRN: [id number] Visit Date: 04/18/2023              Requested by: Avanell Shackleton, NP-C [address] [place],  [place] [address] PCP: Avanell Shackleton, NP-C   Assessment & Plan: Visit Diagnoses:  1. Pain in left hip   2. Avascular necrosis of bone of left hip (HCC)     Plan: Patient is ready for total hip arthroplasty for left hip AVN she will see Dr. Allie Bossier to be scheduled.  Follow-Up Instructions: No follow-ups on file.   Orders:  Orders Placed This Encounter  Procedures   XR HIP UNILAT W OR W/O PELVIS 1V LEFT   No orders of the defined types were placed in this encounter.     Procedures: No procedures performed   Clinical Data: No additional findings.   Subjective: Chief Complaint  Patient presents with   Left Hip - Pain    HPI 73 year old female previous history of severe alcohol intake states quit 2 years ago she had problems with falls tibial plateau fracture.  She has avascular necrosis left hip and 1 point had A1c greater than 7.5 and an albumin less than 3.  Her albumin is now corrected and her A1c  is 6.4.  Her January albumin was 4.6 which was normal and total protein was normal at 7.0.  She states she is now ready for left total hip arthroplasty.  She has been rolling in a wheelchair since she has severe hip pain. She has a walker but does not use it since last summer and is fearful of falling.  Patient is currently living with her ex-husband.  Review of Systems all systems are noncontributory.   Objective: Vital Signs: There were no vitals taken for this visit.  Physical Exam Constitutional:      Appearance: She is well-developed.  HENT:     Head: Normocephalic.     Right Ear: External ear normal.     Left Ear: External ear normal. There is no impacted cerumen.  Eyes:     Pupils: Pupils are equal, round, and reactive to light.  Neck:      Thyroid: No thyromegaly.     Trachea: No tracheal deviation.  Cardiovascular:     Rate and Rhythm: Normal rate.  Pulmonary:     Effort: Pulmonary effort is normal.  Abdominal:     Palpations: Abdomen is soft.  Musculoskeletal:     Cervical back: No rigidity.  Skin:    General: Skin is warm and dry.  Neurological:     Mental Status: She is alert and oriented to person, place, and time.  Psychiatric:        Behavior: Behavior normal.     Ortho Exam patient is able to stand she has severe pain with internal rotation left hip.  No significant swelling of the left lower extremity she has palpable pulses.  Station anteriorly over the thighs intact.  She has good quad strength.  Pain with resisted hip flexion.  Specialty Comments:  No specialty comments available.  Imaging: XR HIP UNILAT W OR W/O PELVIS 1V LEFT Result Date: 04/18/2023 Standing AP and frog-leg x-ray demonstrates left AVN with secondary osteoarthritis left hip.  Right hip is preserved without AVN.  Patient has some shortening on the left due to  head collapse. Impression: Left hip avascular necrosis with collapse and secondary osteoarthritis.    PMFS History: Patient Active Problem List   Diagnosis Date Noted   Hyperlipidemia associated with type 2 diabetes mellitus (HCC) 10/20/2022   Diabetic nephropathy associated with type 2 diabetes mellitus (HCC) 07/25/2022   Age-related cataract of both eyes 07/25/2022   Age-related osteoporosis with current pathological fracture 07/25/2022   Poor dentition 07/25/2022   Hypertriglyceridemia 06/15/2022   Diabetes mellitus (HCC) 06/08/2022   Avascular necrosis of bone of left hip (HCC) 02/04/2022   Arthritis of left hip 02/04/2022   Vitamin D deficiency 10/16/2020   Cirrhosis of liver (HCC) 10/16/2020   Seizure disorder (HCC) 10/16/2020   Chronic heart failure (HCC) 10/16/2020   Athscl heart disease of native coronary artery w/o ang pctrs 10/16/2020   Gastro-esophageal reflux  disease without esophagitis 10/16/2020   Chronic pain syndrome 10/16/2020   Hypertensive heart disease with heart failure (HCC) 02/15/2020   History of ETOH abuse 12/24/2019   Hypoalbuminemia 12/24/2019   Congestive heart failure (CHF) (HCC) 12/24/2019   Anasarca 12/24/2019   Increased ammonia level    Palliative care by specialist    Goals of care, counseling/discussion    AKI (acute kidney injury) (HCC)    Hepatic encephalopathy (HCC) 11/01/2019   Hypotension 11/01/2019   Jaundice 11/01/2019   Hyperbilirubinemia 11/01/2019   Hyperammonemia (HCC) 11/01/2019   Alcoholic hepatitis 11/01/2019   Acute liver failure 10/31/2019   Sinus tachycardia 08/09/2019   Severe protein-calorie malnutrition (HCC) 08/07/2019   Hypomagnesemia 08/06/2019   H/O right nephrectomy 08/06/2019   Chronic diarrhea 08/06/2019   Closed fracture of left tibial plateau 07/02/2018   Traumatic hematoma of cheek 11/15/2017   Orbital floor (blow-out) closed fracture (HCC) 11/15/2017   Cancer of right kidney (HCC) 09/11/2017   Thyroid nodule 01/04/2017   Pancreatic pseudocyst 01/04/2017   Acute respiratory failure with hypoxia (HCC)    Epigastric abdominal tenderness 12/18/2016   Gastric mass 12/17/2016    Class: Acute   Right renal mass 12/17/2016    Class: Acute   Diverticulosis of sigmoid colon 12/17/2016   Hiatal hernia 12/17/2016    Class: Acute   Chronic alcoholism (HCC) 12/17/2016    Class: Chronic   Chronic left shoulder pain 12/17/2016    Class: Chronic   Arthritis 12/17/2016    Class: Chronic   Hyponatremia 12/17/2016    Class: Acute   Hypokalemia 12/17/2016    Class: Acute   Anemia 12/17/2016   Atrial fibrillation with rapid ventricular response (HCC) 12/17/2016    Class: Acute   Past Medical History:  Diagnosis Date   Anemia    Arthritis    Asthma    as a child   Atrial fibrillation with RVR (HCC)    Cancer (HCC)    right kidney   Chronic alcoholism (HCC)    Chronic left  shoulder pain    Dysrhythmia    Atrial Fib with RVR    GERD (gastroesophageal reflux disease)    Heartburn    Hiatal hernia 12/17/2016   stomach noted in chest on CT scan   History of blood transfusion    Hypertension    Memory loss    Schizophrenia (HCC)    Seizures (HCC)     had when she had period and when she ovalate   Thyroid nodule     Family History  Problem Relation Age of Onset   Stroke Mother    Heart attack Father    Ulcers  Father    Diabetes Sister    Cancer Brother    Diabetes Sister    Diabetes Sister     Past Surgical History:  Procedure Laterality Date   ANKLE FRACTURE SURGERY Right 2008   screws   ANKLE FRACTURE SURGERY Left 2008   plates   ESOPHAGOGASTRODUODENOSCOPY (EGD) WITH PROPOFOL N/A 12/18/2016   Procedure: ESOPHAGOGASTRODUODENOSCOPY (EGD) WITH PROPOFOL;  Surgeon: Charlott Rakes, MD;  [facility] ENDOSCOPY;  Service: Endoscopy;  Laterality: N/A;   NASAL SINUS SURGERY Left 11/17/2017   Procedure: ENDOSCOPIC SINUS approach;  Surgeon: Christia Reading, MD;  Location: [facility] OR;  Service: ENT;  Laterality: Left;   NEPHRECTOMY Right    robotic riight radical    ORIF ORBITAL FRACTURE Left 11/17/2017   Procedure: Trans Antral Left sided orbital floor fracture repair with dissovable floor implant;  Surgeon: Christia Reading, MD;  Location: [facility] OR;  Service: ENT;  Laterality: Left;   Social History   Occupational History   Not on file  Tobacco Use   Smoking status: Former    Current packs/day: 0.00    Types: Cigarettes    Start date: 11/13/1992    Quit date: 11/14/2007    Years since quitting: 15.4   Smokeless tobacco: Never  Vaping Use   Vaping status: Never Used  Substance and Sexual Activity   Alcohol use: Not Currently    Comment: none since fall 11/13/17   Drug use: No   Sexual activity: Not Currently

## 2023-04-19 ENCOUNTER — Other Ambulatory Visit: Payer: Self-pay | Admitting: Family Medicine

## 2023-04-20 ENCOUNTER — Other Ambulatory Visit: Payer: Self-pay | Admitting: Family Medicine

## 2023-04-25 ENCOUNTER — Other Ambulatory Visit: Payer: Self-pay | Admitting: Family Medicine

## 2023-04-27 ENCOUNTER — Other Ambulatory Visit: Payer: Self-pay | Admitting: Family Medicine

## 2023-04-27 DIAGNOSIS — E1169 Type 2 diabetes mellitus with other specified complication: Secondary | ICD-10-CM

## 2023-05-01 ENCOUNTER — Encounter: Payer: Self-pay | Admitting: Internal Medicine

## 2023-05-01 ENCOUNTER — Ambulatory Visit: Payer: 59 | Attending: Internal Medicine | Admitting: Internal Medicine

## 2023-05-01 VITALS — BP 118/88 | HR 74 | Ht 67.0 in | Wt 149.4 lb

## 2023-05-01 DIAGNOSIS — I4821 Permanent atrial fibrillation: Secondary | ICD-10-CM

## 2023-05-01 DIAGNOSIS — E785 Hyperlipidemia, unspecified: Secondary | ICD-10-CM | POA: Diagnosis not present

## 2023-05-01 DIAGNOSIS — I10 Essential (primary) hypertension: Secondary | ICD-10-CM

## 2023-05-01 DIAGNOSIS — Z136 Encounter for screening for cardiovascular disorders: Secondary | ICD-10-CM | POA: Diagnosis not present

## 2023-05-01 DIAGNOSIS — K7460 Unspecified cirrhosis of liver: Secondary | ICD-10-CM

## 2023-05-01 MED ORDER — ICOSAPENT ETHYL 1 G PO CAPS: 2.0000 g | ORAL_CAPSULE | Freq: Two times a day (BID) | ORAL | 3 refills | Status: DC

## 2023-05-01 MED ORDER — DIGOXIN 125 MCG PO TABS: 0.0625 mg | ORAL_TABLET | Freq: Every day | ORAL | 3 refills | Status: DC

## 2023-05-01 NOTE — Patient Instructions (Addendum)
 Medication Instructions:  Your physician has recommended you make the following change in your medication:  Stop taking Fenofibrate  Start taking Vascepa 2g twice daily Decrease Digoxin from 0.125 mg to 0.0625 mg once daily  Continue taking all other medications as prescribed  Labwork: INR and CMP to be completed today/tomorrow at [facility] or [facility] Fasting Lipid panel in 6 months prior to 6 month follow up  Testing/Procedures: None  Follow-Up: Your physician recommends that you schedule a follow-up appointment in: 6 months  Any Other Special Instructions Will Be Listed Below (If Applicable). Thank you for choosing [facility]!     If you need a refill on your cardiac medications before your next appointment, please call your pharmacy.

## 2023-05-01 NOTE — Progress Notes (Signed)
 Cardiology Office Note  Date: 05/01/2023   ID: Nicole [name], DOB March 14, 1950, MRN [id number]  PCP:  Avanell Shackleton, NP-C  Cardiologist:  Marjo Bicker, MD Electrophysiologist:  None   History of Present Illness: Nicole [name] is a 73 y.o. female known to have permanent A-fib on [facility], alcoholic liver cirrhosis, HTN was referred to cardiology clinic to establish care.  I reviewed her records from Care Everywhere.  She was diagnosed with atrial fibrillation and admitted with A-fib with RVR in 2021 at [facility].  She has been on BB and AC since then.  Does not have any symptoms related to A-fib.  She walks with a walker at home, not much active at home.  No chest pains, DOE, dizziness, presyncope, syncope or leg swelling.  Currently taking digoxin, metoprolol and Eliquis for A-fib.  Echocardiogram in 2021 showed low normal LVEF 50 to 50%, mildly reduced RV systolic function with mildly dilated RV size, severe biatrial enlargement.  She quit alcohol and smoking many years ago.  Past Medical History:  Diagnosis Date   Anemia    Arthritis    Asthma    as a child   Atrial fibrillation with RVR (HCC)    Cancer (HCC)    right kidney   Chronic alcoholism (HCC)    Chronic left shoulder pain    Dysrhythmia    Atrial Fib with RVR    GERD (gastroesophageal reflux disease)    Heartburn    Hiatal hernia 12/17/2016   stomach noted in chest on CT scan   History of blood transfusion    Hypertension    Memory loss    Schizophrenia (HCC)    Seizures (HCC)     had when she had period and when she ovalate   Thyroid nodule     Past Surgical History:  Procedure Laterality Date   ANKLE FRACTURE SURGERY Right 2008   screws   ANKLE FRACTURE SURGERY Left 2008   plates   ESOPHAGOGASTRODUODENOSCOPY (EGD) WITH PROPOFOL N/A 12/18/2016   Procedure: ESOPHAGOGASTRODUODENOSCOPY (EGD) WITH PROPOFOL;  Surgeon: Charlott Rakes, MD;  Location: [facility] ENDOSCOPY;  Service: Endoscopy;  Laterality: N/A;   NASAL  SINUS SURGERY Left 11/17/2017   Procedure: ENDOSCOPIC SINUS approach;  Surgeon: Christia Reading, MD;  Location: [facility] OR;  Service: ENT;  Laterality: Left;   NEPHRECTOMY Right    robotic riight radical    ORIF ORBITAL FRACTURE Left 11/17/2017   Procedure: Trans Antral Left sided orbital floor fracture repair with dissovable floor implant;  Surgeon: Christia Reading, MD;  Location: [facility] OR;  Service: ENT;  Laterality: Left;    Current Outpatient Medications  Medication Sig Dispense Refill   Accu-Chek Softclix Lancets lancets USE ONE LANCET IN THE MORNING, AT NOON, AND AT BEDTIME 300 each 3   acetaminophen (TYLENOL) 500 MG tablet Take 500 mg by mouth 2 (two) times daily.     b complex vitamins capsule Take 1 capsule by mouth daily.     Blood Glucose Monitoring Suppl DEVI 1 each by Does not apply route in the morning, at noon, and at bedtime. May substitute to any manufacturer covered by patient's insurance. 1 each 0   Calcium Carbonate (CALCIUM 600 PO) Take by mouth in the morning and at bedtime.     diclofenac Sodium (VOLTAREN) 1 % GEL      digoxin (LANOXIN) 0.125 MG tablet TAKE 1 TABLET(125 MCG) BY MOUTH DAILY 90 tablet 0   ELIQUIS 5 MG TABS tablet TAKE 1 TABLET(5 MG)  BY MOUTH TWICE DAILY 180 tablet 0   fenofibrate (TRICOR) 145 MG tablet TAKE 1 TABLET(145 MG) BY MOUTH DAILY 90 tablet 0   Fexofenadine HCl (ALLERGY 24-HR PO) Take by mouth.     glucose blood (ACCU-CHEK GUIDE TEST) test strip USE ONE STRIP IN THE MORNING, AT NOON, AND AT BEDTIME 300 strip 3   lidocaine 4 % Place 1 patch onto the skin as needed.     lisinopril (ZESTRIL) 2.5 MG tablet TAKE 1 TABLET(2.5 MG) BY MOUTH DAILY 90 tablet 1   loperamide (IMODIUM) 2 MG capsule Take by mouth as needed for diarrhea or loose stools.     Magnesium 250 MG TABS Take 2 tablets by mouth daily.     metFORMIN (GLUCOPHAGE) 500 MG tablet TAKE 2 TABLETS BY MOUTH WITH BREAKFAST AND 1 TABLET WITH EVENING MEAL 270 tablet 1   metoprolol tartrate (LOPRESSOR) 50 MG  tablet TAKE 1 TABLET(50 MG) BY MOUTH TWICE DAILY 180 tablet 1   mirtazapine (REMERON) 15 MG tablet TAKE 1 TABLET(15 MG) BY MOUTH AT BEDTIME 90 tablet 0   Omega-3 Fatty Acids (FISH OIL PO) Take 1,200 mg by mouth daily.     Potassium 99 MG TABS Take by mouth daily.     Propylene Glycol (SYSTANE COMPLETE) 0.6 % SOLN Place 1 drop into both eyes See admin instructions. Instill one drop into both eyes every morning, may also use later in the day as needed for dry eyes     rifaximin (XIFAXAN) 550 MG TABS tablet Take 1 tablet (550 mg total) by mouth 2 (two) times daily. 60 tablet 1   rosuvastatin (CRESTOR) 5 MG tablet TAKE 1 TABLET BY MOUTH DAILY 90 tablet 1   Turmeric (QC TUMERIC COMPLEX PO) Take 1,000 mg by mouth in the morning and at bedtime.     UNABLE TO FIND Med Name: Gynovite Plus     No current facility-administered medications for this visit.   Allergies:  Other, Shellfish allergy, and Iodinated contrast media   Social History: The patient  reports that she quit smoking about 15 years ago. Her smoking use included cigarettes. She started smoking about 30 years ago. She has never used smokeless tobacco. She reports that she does not currently use alcohol. She reports that she does not use drugs.   Family History: The patient's family history includes Cancer in her brother; Diabetes in her sister, sister, and sister; Heart attack in her father; Stroke in her mother; Ulcers in her father.   ROS:  Please see the history of present illness. Otherwise, complete review of systems is positive for none  All other systems are reviewed and negative.   Physical Exam: VS:  BP 118/88   Pulse 74   Ht 5\' 7"  (1.702 m)   Wt 149 lb 6.4 oz (67.8 kg)   SpO2 97%   BMI 23.40 kg/m , BMI Body mass index is 23.4 kg/m.  Wt Readings from Last 3 Encounters:  05/01/23 149 lb 6.4 oz (67.8 kg)  02/03/22 160 lb (72.6 kg)  06/22/20 134 lb (60.8 kg)    General: Patient appears comfortable at rest. HEENT:  Conjunctiva and lids normal, oropharynx clear with moist mucosa. Neck: Supple, no elevated JVP or carotid bruits, no thyromegaly. Lungs: Clear to auscultation, nonlabored breathing at rest. Cardiac: Regular rate and rhythm, no S3 or significant systolic murmur, no pericardial rub. Abdomen: Soft, nontender, no hepatomegaly, bowel sounds present, no guarding or rebound. Extremities: No pitting edema, distal pulses 2+. Skin: Warm  and dry. Musculoskeletal: No kyphosis. Neuropsychiatric: Alert and oriented x3, affect grossly appropriate.  Recent Labwork: 05/05/2022: TSH 1.86 02/22/2023: ALT 17; AST 20; BUN 30; Creatinine, Ser 1.30; Hemoglobin 13.1; Platelets 213.0; Potassium 5.1; Sodium 140     Component Value Date/Time   CHOL 113 10/20/2022 1603   TRIG 334.0 (H) 10/20/2022 1603   HDL 34.10 (L) 10/20/2022 1603   CHOLHDL 3 10/20/2022 1603   VLDL 66.8 (H) 10/20/2022 1603   LDLCALC 13 10/20/2022 1603   LDLDIRECT 86.0 06/08/2022 1627     Assessment and Plan:   Permanent atrial fibrillation: EKG today showed atrial fibrillation, HR 74 bpm.  Asymptomatic.  Will continue metoprolol titrate 50 mg twice daily, decrease dose of digoxin from 0.125 mg to 0.0625 mg once daily due to concern/risk of digoxin toxicity in elderly and CKD patients.  Continue Eliquis 5 mg twice daily.  She had a fall recently but she is not at high risk of falls.  Will obtain a CMP and INR to determine if she needs to be on DOAC versus Coumadin due to history of alcoholic liver cirrhosis. Echocardiogram in 2021 showed normal LVEF, 50 to 55%, severe biatrial enlargement and mild MR.  RV function is mildly reduced with mildly dilated RV size.  Hypertriglyceridemia: TG levels elevated 334 in September 20 referred.  Currently on fenofibrate 125 mg once daily, will discontinue and start Vascepa 2 g twice daily.  Will obtain fasting lipid panel in 6 months before she sees me.  Alcohol liver cirrhosis: She was on lactulose in the past  for encephalopathy but not anymore.  Instead, she is on rifaximin 550 mg twice daily.  No bleeding events.  Follows with PCP/GI.  HTN, controlled: Continue lisinopril 2.5 mg once daily, metoprolol as stated above.      Medication Adjustments/Labs and Tests Ordered: Current medicines are reviewed at length with the patient today.  Concerns regarding medicines are outlined above.    Disposition:  Follow up  6 months  Signed Tamaka Sawin Verne Spurr, MD, 05/01/2023 4:00 PM    [facility] at [facility] [address] [place], [place], [place] [address]

## 2023-05-04 DIAGNOSIS — K7460 Unspecified cirrhosis of liver: Secondary | ICD-10-CM | POA: Diagnosis not present

## 2023-05-04 DIAGNOSIS — E785 Hyperlipidemia, unspecified: Secondary | ICD-10-CM | POA: Diagnosis not present

## 2023-05-05 ENCOUNTER — Telehealth: Payer: Self-pay | Admitting: Internal Medicine

## 2023-05-05 LAB — COMPREHENSIVE METABOLIC PANEL
ALT: 16 IU/L (ref 0–32)
AST: 20 IU/L (ref 0–40)
Albumin: 4.4 g/dL (ref 3.8–4.8)
Alkaline Phosphatase: 69 IU/L (ref 44–121)
BUN/Creatinine Ratio: 27 (ref 12–28)
BUN: 35 mg/dL — ABNORMAL HIGH (ref 8–27)
Bilirubin Total: 0.3 mg/dL (ref 0.0–1.2)
CO2: 21 mmol/L (ref 20–29)
Calcium: 9.2 mg/dL (ref 8.7–10.3)
Chloride: 98 mmol/L (ref 96–106)
Creatinine, Ser: 1.28 mg/dL — ABNORMAL HIGH (ref 0.57–1.00)
Globulin, Total: 1.8 g/dL (ref 1.5–4.5)
Glucose: 134 mg/dL — ABNORMAL HIGH (ref 70–99)
Potassium: 4.7 mmol/L (ref 3.5–5.2)
Sodium: 134 mmol/L (ref 134–144)
Total Protein: 6.2 g/dL (ref 6.0–8.5)
eGFR: 44 mL/min/{1.73_m2} — ABNORMAL LOW (ref >59)

## 2023-05-05 LAB — PROTIME-INR
INR: 1.1 (ref 0.9–1.2)
Prothrombin Time: 12 s (ref 9.1–12.0)

## 2023-05-05 LAB — LIPID PANEL
Chol/HDL Ratio: 4.3 ratio (ref 0.0–4.4)
Cholesterol, Total: 125 mg/dL (ref 100–199)
HDL: 29 mg/dL — ABNORMAL LOW (ref >39)
LDL Chol Calc (NIH): 50 mg/dL (ref 0–99)
Triglycerides: 296 mg/dL — ABNORMAL HIGH (ref 0–149)
VLDL Cholesterol Cal: 46 mg/dL — ABNORMAL HIGH (ref 5–40)

## 2023-05-05 NOTE — Telephone Encounter (Signed)
 Pt called in to clarify if she should be continue taking her fish oil supplement. Please advise.

## 2023-05-08 DIAGNOSIS — H462 Nutritional optic neuropathy: Secondary | ICD-10-CM | POA: Diagnosis not present

## 2023-05-08 DIAGNOSIS — H40013 Open angle with borderline findings, low risk, bilateral: Secondary | ICD-10-CM | POA: Diagnosis not present

## 2023-05-08 DIAGNOSIS — H52213 Irregular astigmatism, bilateral: Secondary | ICD-10-CM | POA: Diagnosis not present

## 2023-05-08 DIAGNOSIS — H25813 Combined forms of age-related cataract, bilateral: Secondary | ICD-10-CM | POA: Diagnosis not present

## 2023-05-08 DIAGNOSIS — H524 Presbyopia: Secondary | ICD-10-CM | POA: Diagnosis not present

## 2023-05-08 LAB — HM DIABETES EYE EXAM

## 2023-05-08 NOTE — Telephone Encounter (Signed)
Left a message for patient to call office back regarding medication 

## 2023-05-09 NOTE — Telephone Encounter (Signed)
 Patient says she has started vascepa and will stop fish oil

## 2023-05-10 ENCOUNTER — Ambulatory Visit (INDEPENDENT_AMBULATORY_CARE_PROVIDER_SITE_OTHER): Admitting: Orthopaedic Surgery

## 2023-05-10 ENCOUNTER — Encounter: Payer: Self-pay | Admitting: Orthopaedic Surgery

## 2023-05-10 DIAGNOSIS — M87052 Idiopathic aseptic necrosis of left femur: Secondary | ICD-10-CM

## 2023-05-10 DIAGNOSIS — M25552 Pain in left hip: Secondary | ICD-10-CM

## 2023-05-10 NOTE — Progress Notes (Signed)
 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  She was seen by my partner Dr. Ophelia Charter and sent to me for getting her set up for hip replacement.  She does ambulate with a walker and mainly wheelchair as well.  She reports severe and daily left hip pain.  She has a history of an alcoholic but has been sober for a long period of time.  She is on Eliquis due to atrial fibrillation and sees her cardiologist regularly.  At this point her left hip pain is 10 out of 10 and it is daily.  It is detrimentally affecting her mobility, her quality of life and her actives daily living to the point she does need hip replacement surgery.  On exam her left hip has significant limitations with mobility and severe pain with any attempted rotation of her left hip.  Her right hip exam is entirely normal.  X-rays of the pelvis and left hip were reviewed and shows end-stage avascular necrosis of the left hip with femoral head collapse and cystic changes in the femoral head.  The right hip appears normal.  I did give her handout about hip replacement surgery and talked in length in detail about what the surgery involves.  She would need to be off Eliquis for 3 full days in order to have spinal anesthesia for the surgery.  We talked about the risks and benefits of surgery and what to expect from an intraoperative and postoperative standpoint.  All questions and concerns were answered and addressed.  Will work on getting him scheduled soon for a left total hip arthroplasty

## 2023-05-11 DIAGNOSIS — N3001 Acute cystitis with hematuria: Secondary | ICD-10-CM | POA: Diagnosis not present

## 2023-05-12 ENCOUNTER — Other Ambulatory Visit: Payer: Self-pay | Admitting: Family Medicine

## 2023-05-21 DIAGNOSIS — R030 Elevated blood-pressure reading, without diagnosis of hypertension: Secondary | ICD-10-CM | POA: Diagnosis not present

## 2023-05-21 DIAGNOSIS — N3001 Acute cystitis with hematuria: Secondary | ICD-10-CM | POA: Diagnosis not present

## 2023-05-21 DIAGNOSIS — Z6823 Body mass index (BMI) 23.0-23.9, adult: Secondary | ICD-10-CM | POA: Diagnosis not present

## 2023-05-22 ENCOUNTER — Other Ambulatory Visit: Payer: Self-pay

## 2023-05-22 DIAGNOSIS — M8000XA Age-related osteoporosis with current pathological fracture, unspecified site, initial encounter for fracture: Secondary | ICD-10-CM

## 2023-05-24 ENCOUNTER — Inpatient Hospital Stay: Admission: RE | Admit: 2023-05-24 | Source: Ambulatory Visit

## 2023-05-24 ENCOUNTER — Telehealth: Payer: Self-pay

## 2023-05-24 NOTE — Telephone Encounter (Signed)
 I called patient to discuss scheduling THA surgery.  Left voice mail message for return call.

## 2023-05-26 ENCOUNTER — Telehealth: Payer: Self-pay

## 2023-05-26 ENCOUNTER — Other Ambulatory Visit: Payer: Self-pay | Admitting: Internal Medicine

## 2023-05-26 NOTE — Telephone Encounter (Signed)
 Returned patient's voice mail message to discuss scheduling surgery.  Left voice mail for return call.

## 2023-05-31 ENCOUNTER — Ambulatory Visit (INDEPENDENT_AMBULATORY_CARE_PROVIDER_SITE_OTHER)
Admission: RE | Admit: 2023-05-31 | Discharge: 2023-05-31 | Disposition: A | Discharge status: Home / Self Care | Source: Ambulatory Visit | Attending: Family Medicine | Admitting: Family Medicine

## 2023-05-31 DIAGNOSIS — M8589 Other specified disorders of bone density and structure, multiple sites: Secondary | ICD-10-CM

## 2023-05-31 DIAGNOSIS — M8000XD Age-related osteoporosis with current pathological fracture, unspecified site, subsequent encounter for fracture with routine healing: Secondary | ICD-10-CM

## 2023-06-01 DIAGNOSIS — N1832 Chronic kidney disease, stage 3b: Secondary | ICD-10-CM | POA: Diagnosis not present

## 2023-06-01 DIAGNOSIS — E559 Vitamin D deficiency, unspecified: Secondary | ICD-10-CM | POA: Diagnosis not present

## 2023-06-01 DIAGNOSIS — I129 Hypertensive chronic kidney disease with stage 1 through stage 4 chronic kidney disease, or unspecified chronic kidney disease: Secondary | ICD-10-CM | POA: Diagnosis not present

## 2023-06-01 DIAGNOSIS — D631 Anemia in chronic kidney disease: Secondary | ICD-10-CM | POA: Diagnosis not present

## 2023-06-06 ENCOUNTER — Other Ambulatory Visit: Payer: Self-pay | Admitting: Family Medicine

## 2023-06-06 ENCOUNTER — Encounter: Payer: Self-pay | Admitting: Family Medicine

## 2023-06-06 DIAGNOSIS — M8000XA Age-related osteoporosis with current pathological fracture, unspecified site, initial encounter for fracture: Secondary | ICD-10-CM

## 2023-06-06 NOTE — Progress Notes (Signed)
 I am referring her to the osteoporosis clinic at [facility].

## 2023-06-19 NOTE — Patient Instructions (Signed)
 SURGICAL WAITING ROOM VISITATION Patients having surgery or a procedure may have no more than 2 support people in the waiting area - these visitors may rotate in the visitor waiting room.   If the patient needs to stay at the hospital during part of their recovery, the visitor guidelines for inpatient rooms apply.  PRE-OP VISITATION  Pre-op nurse will coordinate an appropriate time for 1 support person to accompany the patient in pre-op.  This support person may not rotate.  This visitor will be contacted when the time is appropriate for the visitor to come back in the pre-op area.  Please refer to the [facility] website for the visitor guidelines for Inpatients (after your surgery is over and you are in a regular room).  You are not required to quarantine at this time prior to your surgery. However, you must do this: Hand Hygiene often Do NOT share personal items Notify your provider if you are in close contact with someone who has COVID or you develop fever 100.4 or greater, new onset of sneezing, cough, sore throat, shortness of breath or body aches.  If you test positive for Covid or have been in contact with anyone that has tested positive in the last 10 days please notify you surgeon.    Your procedure is scheduled on:  FRIDAY  Jun 23, 2023  Report to [facility] Main Entrance: [address] entrance where the [employer] is available.   Report to admitting at: 05:15  AM  Call this number if you have any questions or problems the morning of surgery [phone]  Do not eat food after Midnight the night prior to your surgery/procedure.  After Midnight you may have the following liquids until   04:15 AM DAY OF SURGERY  Clear Liquid Diet Water  Black Coffee (sugar ok, NO MILK/CREAM OR CREAMERS)  Tea (sugar ok, NO MILK/CREAM OR CREAMERS) regular and decaf                             Plain Jell-O  with no fruit (NO RED)                                           Fruit ices (not  with fruit pulp, NO RED)                                     Popsicles (NO RED)                                                                  Juice: NO CITRUS JUICES: only apple, WHITE grape, WHITE cranberry Sports drinks like Gatorade or Powerade (NO RED)                   The day of surgery:  Drink ONE (1) Pre-Surgery G2 at   04:15 AM the morning of surgery. Drink in one sitting. Do not sip.  This drink was given to you during your hospital pre-op appointment visit. Nothing else to drink after completing the  Pre-Surgery G2 : No candy, chewing gum or throat lozenges.    FOLLOW ANY ADDITIONAL PRE OP INSTRUCTIONS YOU RECEIVED FROM YOUR SURGEON'S OFFICE!!!   Oral Hygiene is also important to reduce your risk of infection.        Remember - BRUSH YOUR TEETH THE MORNING OF SURGERY WITH YOUR REGULAR TOOTHPASTE  Do NOT smoke after Midnight the night before surgery.  METFORMIN -  Day BEFORE surgery, take as usual.  DO NOT TAKE METFORMIN  THE DAY OF YOUR SURGERY.  ELIQUIS -   ?????  STOP TAKING all Vitamins, Herbs and supplements 1 week before your surgery.   Take ONLY these medicines the morning of surgery with A SIP OF WATER :  Digoxin , Metoprolol  and tylenol  if needed for pain. You may use your eye drops if needed.   You may not have any metal on your body including hair pins, jewelry, and body piercing  Do not wear make-up, lotions, powders, perfumes or deodorant  Do not wear nail polish including gel and S&S, artificial / acrylic nails, or any other type of covering on natural nails including finger and toenails. If you have artificial nails, gel coating, etc., that needs to be removed by a nail salon, Please have this removed prior to surgery. Not doing so may mean that your surgery could be cancelled or delayed if the Surgeon or anesthesia staff feels like they are unable to monitor you safely.   Do not shave 48 hours prior to surgery to avoid nicks in your skin which may contribute  to postoperative infections.   Contacts, Hearing Aids, dentures or bridgework may not be worn into surgery. DENTURES WILL BE REMOVED PRIOR TO SURGERY PLEASE DO NOT APPLY "Poly grip" OR ADHESIVES!!!  You may bring a small overnight bag with you on the day of surgery, only pack items that are not valuable. [facility] IS NOT RESPONSIBLE   FOR VALUABLES THAT ARE LOST OR STOLEN.   Patients discharged on the day of surgery will not be allowed to drive home.  Someone NEEDS to stay with you for the first 24 hours after anesthesia.  Do not bring your home medications to the hospital. The Pharmacy will dispense medications listed on your medication list to you during your admission in the Hospital.  Please read over the following fact sheets you were given: IF YOU HAVE QUESTIONS ABOUT YOUR PRE-OP INSTRUCTIONS, PLEASE CALL [phone].     Pre-operative 5 CHG Bath Instructions   You can play a key role in reducing the risk of infection after surgery. Your skin needs to be as free of germs as possible. You can reduce the number of germs on your skin by washing with CHG (chlorhexidine  gluconate) soap before surgery. CHG is an antiseptic soap that kills germs and continues to kill germs even after washing.   DO NOT use if you have an allergy to chlorhexidine /CHG or antibacterial soaps. If your skin becomes reddened or irritated, stop using the CHG and notify one of our RNs at [phone]  Please shower with the CHG soap starting 3 days before surgery using the following schedule: START SHOWERS ON   TUESDAY  Jun 20, 2023  Please keep in mind the following:  DO NOT shave, including legs and underarms, starting the day of your first shower.   You may shave your face at any point before/day of surgery.   Place clean sheets on your bed  the day you start using CHG soap. Use a clean washcloth (not used since being washed) for each shower. DO NOT sleep with pets once you start using the CHG.   CHG Shower Instructions:  If you choose to wash your hair and private area, wash first with your normal shampoo/soap.  After you use shampoo/soap, rinse your hair and body thoroughly to remove shampoo/soap residue.  Turn the water  OFF and apply about 3 tablespoons (45 ml) of CHG soap to a CLEAN washcloth.  Apply CHG soap ONLY FROM YOUR NECK DOWN TO YOUR TOES (washing for 3-5 minutes)  DO NOT use CHG soap on face, private areas, open wounds, or sores.  Pay special attention to the area where your surgery is being performed.  If you are having back surgery, having someone wash your back for you may be helpful.  Wait 2 minutes after CHG soap is applied, then you may rinse off the CHG soap.  Pat dry with a clean towel  Put on clean clothes/pajamas   If you choose to wear lotion, please use ONLY the CHG-compatible lotions on the back of this paper.     Additional instructions for the day of surgery: DO NOT APPLY any lotions, deodorants, cologne, or perfumes.   Put on clean/comfortable clothes.  Brush your teeth.  Ask your nurse before applying any prescription medications to the skin.      CHG Compatible Lotions   Aveeno Moisturizing lotion  Cetaphil Moisturizing Cream  Cetaphil Moisturizing Lotion  Clairol Herbal Essence Moisturizing Lotion, Dry Skin  Clairol Herbal Essence Moisturizing Lotion, Extra Dry Skin  Clairol Herbal Essence Moisturizing Lotion, Normal Skin  Curel Age Defying Therapeutic Moisturizing Lotion with Alpha Hydroxy  Curel Extreme Care Body Lotion  Curel Soothing Hands Moisturizing Hand Lotion  Curel Therapeutic Moisturizing Cream, Fragrance-Free  Curel Therapeutic Moisturizing Lotion, Fragrance-Free  Curel Therapeutic Moisturizing Lotion, Original Formula  Eucerin Daily Replenishing Lotion  Eucerin Dry  Skin Therapy Plus Alpha Hydroxy Crme  Eucerin Dry Skin Therapy Plus Alpha Hydroxy Lotion  Eucerin Original Crme  Eucerin Original Lotion  Eucerin Plus Crme Eucerin Plus Lotion  Eucerin TriLipid Replenishing Lotion  Keri Anti-Bacterial Hand Lotion  Keri Deep Conditioning Original Lotion Dry Skin Formula Softly Scented  Keri Deep Conditioning Original Lotion, Fragrance Free Sensitive Skin Formula  Keri Lotion Fast Absorbing Fragrance Free Sensitive Skin Formula  Keri Lotion Fast Absorbing Softly Scented Dry Skin Formula  Keri Original Lotion  Keri Skin Renewal Lotion Keri Silky Smooth Lotion  Keri Silky Smooth Sensitive Skin Lotion  Nivea Body Creamy Conditioning Oil  Nivea Body Extra Enriched Lotion  Nivea Body Original Lotion  Nivea Body Sheer Moisturizing Lotion Nivea Crme  Nivea Skin Firming Lotion  NutraDerm 30 Skin Lotion  NutraDerm Skin Lotion  NutraDerm Therapeutic Skin Cream  NutraDerm Therapeutic Skin Lotion  ProShield Protective Hand Cream  Provon moisturizing lotion   FAILURE TO FOLLOW THESE INSTRUCTIONS MAY RESULT IN THE CANCELLATION OF YOUR SURGERY  PATIENT SIGNATURE_________________________________  NURSE SIGNATURE__________________________________  ________________________________________________________________________

## 2023-06-19 NOTE — Progress Notes (Signed)
 COVID Vaccine received:  [x]  No []  Yes Date of any COVID positive Test in last 90 days:  none  PCP - Alyson Back, NP-C  at [facility]  [phone] Cardiologist - Kelly Patient, MD Nephrology-  Clevester Dally, MD  Chest x-ray - 11-05-2019  1v  Epic EKG -  05-01-2023  Epic Stress Test -  ECHO - 12-23-2016  Epic Cardiac Cath -   Pacemaker / ICD device [x]  No []  Yes   Spinal Cord Stimulator:[x]  No []  Yes       History of Sleep Apnea? [x]  No []  Yes   CPAP used?- [x]  No []  Yes    Does the patient monitor blood sugar?   []  N/A   []  No [x]  Yes  Patient has: []  NO Hx DM   []  Pre-DM   []  DM1  [x]   DM2 Last A1c was: 6.4   on   02-22-23   Does patient have a [employer] or Dexcom? [x]  No []  Yes   Fasting Blood Sugar Ranges- 100-120 Checks Blood Sugar 2_ times a day  METFORMIN  1000 mg in morning and 500 mg in the evening.   Hold DOS  Blood Thinner / Instructions:   Eliquis   hold x 3d (last dose was 06-19-23 per patient).  spoke w/ Derrick Fling re: need for clearances.  Aspirin Instructions: none  ERAS Protocol Ordered: []  No  [x]  Yes PRE-SURGERY []  ENSURE  [x]  G2  Patient is to be NPO after: 0415  Dental hx: []  Dentures:  []  N/A      []  Bridge or Partial:                   [x]  Loose or Damaged teeth:   Comments: Patient was given the 5 CHG shower / bath instructions for THA surgery along with 2 bottles of the CHG soap. Patient will start this on:  06-20-23  (PST was 4 days out)            Activity level: Patient is unable to climb a flight of stairs without difficulty; [x]  No CP  [x]  No SOB, but would have leg and back pain. Patient can perform ADLs without assistance.   Anesthesia review: Solitary kidney (Right nephrectomy for ca  2019), A.fib, Cirrhosis- chronic ETOH, GERD, HTN,  CHF, DM2, Seizure hx- last 2024  no Meds, refuses to see Neurology, anemia, schizophrenia ?  Patient denies shortness of breath, fever, cough and chest pain at PAT appointment.  Patient verbalized understanding and  agreement to the Pre-Surgical Instructions that were given to them at this PAT appointment. Patient was also educated of the need to review these PAT instructions again prior to her surgery.I reviewed the appropriate phone numbers to call if they have any and questions or concerns.

## 2023-06-20 ENCOUNTER — Encounter (HOSPITAL_COMMUNITY)
Admission: RE | Admit: 2023-06-20 | Discharge: 2023-06-20 | Disposition: A | Discharge status: Home / Self Care | Source: Ambulatory Visit | Attending: Orthopaedic Surgery | Admitting: Orthopaedic Surgery

## 2023-06-20 ENCOUNTER — Encounter (HOSPITAL_COMMUNITY): Payer: Self-pay

## 2023-06-20 ENCOUNTER — Other Ambulatory Visit: Payer: Self-pay

## 2023-06-20 VITALS — BP 138/90 | HR 79 | Temp 97.7°F | Resp 20 | Ht 67.0 in | Wt 150.0 lb

## 2023-06-20 DIAGNOSIS — Z87891 Personal history of nicotine dependence: Secondary | ICD-10-CM | POA: Diagnosis not present

## 2023-06-20 DIAGNOSIS — Z79899 Other long term (current) drug therapy: Secondary | ICD-10-CM | POA: Insufficient documentation

## 2023-06-20 DIAGNOSIS — I509 Heart failure, unspecified: Secondary | ICD-10-CM | POA: Insufficient documentation

## 2023-06-20 DIAGNOSIS — K7030 Alcoholic cirrhosis of liver without ascites: Secondary | ICD-10-CM | POA: Diagnosis not present

## 2023-06-20 DIAGNOSIS — M87052 Idiopathic aseptic necrosis of left femur: Secondary | ICD-10-CM

## 2023-06-20 DIAGNOSIS — E1122 Type 2 diabetes mellitus with diabetic chronic kidney disease: Secondary | ICD-10-CM | POA: Diagnosis not present

## 2023-06-20 DIAGNOSIS — E119 Type 2 diabetes mellitus without complications: Secondary | ICD-10-CM

## 2023-06-20 DIAGNOSIS — I4821 Permanent atrial fibrillation: Secondary | ICD-10-CM | POA: Diagnosis not present

## 2023-06-20 DIAGNOSIS — Z01818 Encounter for other preprocedural examination: Secondary | ICD-10-CM

## 2023-06-20 DIAGNOSIS — M879 Osteonecrosis, unspecified: Secondary | ICD-10-CM | POA: Insufficient documentation

## 2023-06-20 DIAGNOSIS — Z7901 Long term (current) use of anticoagulants: Secondary | ICD-10-CM | POA: Diagnosis not present

## 2023-06-20 DIAGNOSIS — I130 Hypertensive heart and chronic kidney disease with heart failure and stage 1 through stage 4 chronic kidney disease, or unspecified chronic kidney disease: Secondary | ICD-10-CM | POA: Diagnosis not present

## 2023-06-20 DIAGNOSIS — Z01812 Encounter for preprocedural laboratory examination: Secondary | ICD-10-CM | POA: Insufficient documentation

## 2023-06-20 DIAGNOSIS — K7460 Unspecified cirrhosis of liver: Secondary | ICD-10-CM

## 2023-06-20 DIAGNOSIS — N189 Chronic kidney disease, unspecified: Secondary | ICD-10-CM | POA: Diagnosis not present

## 2023-06-20 HISTORY — DX: Depression, unspecified: F32.A

## 2023-06-20 HISTORY — DX: Heart failure, unspecified: I50.9

## 2023-06-20 HISTORY — DX: Chronic kidney disease, unspecified: N18.9

## 2023-06-20 HISTORY — DX: Alcoholic cirrhosis of liver without ascites: K70.30

## 2023-06-20 HISTORY — DX: Personal history of other diseases of the digestive system: Z87.19

## 2023-06-20 HISTORY — DX: Type 2 diabetes mellitus without complications: E11.9

## 2023-06-20 LAB — COMPREHENSIVE METABOLIC PANEL WITH GFR
ALT: 27 U/L (ref 0–44)
AST: 23 U/L (ref 15–41)
Albumin: 4 g/dL (ref 3.5–5.0)
Alkaline Phosphatase: 57 U/L (ref 38–126)
Anion gap: 11 (ref 5–15)
BUN: 29 mg/dL — ABNORMAL HIGH (ref 8–23)
CO2: 20 mmol/L — ABNORMAL LOW (ref 22–32)
Calcium: 9.5 mg/dL (ref 8.9–10.3)
Chloride: 103 mmol/L (ref 98–111)
Creatinine, Ser: 0.58 mg/dL (ref 0.44–1.00)
GFR, Estimated: >60 mL/min (ref >60)
Glucose, Bld: 104 mg/dL — ABNORMAL HIGH (ref 70–99)
Potassium: 5.1 mmol/L (ref 3.5–5.1)
Sodium: 134 mmol/L — ABNORMAL LOW (ref 135–145)
Total Bilirubin: 0.6 mg/dL (ref 0.0–1.2)
Total Protein: 6.7 g/dL (ref 6.5–8.1)

## 2023-06-20 LAB — CBC
HCT: 39 % (ref 36.0–46.0)
Hemoglobin: 12.3 g/dL (ref 12.0–15.0)
MCH: 30.5 pg (ref 26.0–34.0)
MCHC: 31.5 g/dL (ref 30.0–36.0)
MCV: 96.8 fL (ref 80.0–100.0)
Platelets: 142 10*3/uL — ABNORMAL LOW (ref 150–400)
RBC: 4.03 MIL/uL (ref 3.87–5.11)
RDW: 13.2 % (ref 11.5–15.5)
WBC: 7.6 10*3/uL (ref 4.0–10.5)
nRBC: 0 % (ref 0.0–0.2)

## 2023-06-20 LAB — HEMOGLOBIN A1C
Hgb A1c MFr Bld: 5.2 % (ref 4.8–5.6)
Mean Plasma Glucose: 102.54 mg/dL

## 2023-06-20 LAB — SURGICAL PCR SCREEN
MRSA, PCR: NEGATIVE
Staphylococcus aureus: NEGATIVE

## 2023-06-20 LAB — GLUCOSE, CAPILLARY: Glucose-Capillary: 102 mg/dL — ABNORMAL HIGH (ref 70–99)

## 2023-06-22 ENCOUNTER — Telehealth: Payer: Self-pay | Admitting: *Deleted

## 2023-06-22 ENCOUNTER — Ambulatory Visit: Attending: Student | Admitting: Student

## 2023-06-22 DIAGNOSIS — M1612 Unilateral primary osteoarthritis, left hip: Secondary | ICD-10-CM | POA: Diagnosis not present

## 2023-06-22 DIAGNOSIS — Z01810 Encounter for preprocedural cardiovascular examination: Secondary | ICD-10-CM | POA: Diagnosis not present

## 2023-06-22 NOTE — Anesthesia Preprocedure Evaluation (Addendum)
 Anesthesia Evaluation  Patient identified by MRN, date of birth, ID band Patient awake    Reviewed: Allergy & Precautions, NPO status , Patient's Chart, lab work & pertinent test results, reviewed documented beta blocker date and time   History of Anesthesia Complications Negative for: history of anesthetic complications  Airway Mallampati: II  TM Distance: >3 FB Neck ROM: Full    Dental  (+) Missing, Poor Dentition   Pulmonary former smoker   Pulmonary exam normal        Cardiovascular hypertension, Pt. on medications and Pt. on home beta blockers + CAD and +CHF  Normal cardiovascular exam+ dysrhythmias (on Eliquis ) Atrial Fibrillation   Echo 2021: low normal LVEF 50 to 50%, mildly reduced RV systolic function with mildly dilated RV size, severe biatrial enlargement   Neuro/Psych Seizures - (last 2024), Well Controlled,    Depression  Schizophrenia     GI/Hepatic ,GERD  Controlled,,(+) Cirrhosis     substance abuse (last >1 year ago)  alcohol  use  Endo/Other  diabetes, Type 2, Oral Hypoglycemic Agents    Renal/GU Renal InsufficiencyRenal disease  negative genitourinary   Musculoskeletal  (+) Arthritis ,  Avascular necrosis of bone of hip, left    Abdominal   Peds  Hematology negative hematology ROS (+) INR 1.1 in 04/2023, Plt 142k   Anesthesia Other Findings Day of surgery medications reviewed with patient.  Reproductive/Obstetrics negative OB ROS                              Anesthesia Physical Anesthesia Plan  ASA: 3  Anesthesia Plan: Spinal   Post-op Pain Management: Tylenol  PO (pre-op)*   Induction:   PONV Risk Score and Plan: 3 and Treatment may vary due to age or medical condition, Ondansetron , Propofol  infusion and Dexamethasone   Airway Management Planned: Natural Airway and Simple Face Mask  Additional Equipment: None  Intra-op Plan:   Post-operative Plan:    Informed Consent: I have reviewed the patients History and Physical, chart, labs and discussed the procedure including the risks, benefits and alternatives for the proposed anesthesia with the patient or authorized representative who has indicated his/her understanding and acceptance.       Plan Discussed with: CRNA  Anesthesia Plan Comments: (See PAT note 06/20/2023)        Anesthesia Quick Evaluation

## 2023-06-22 NOTE — Telephone Encounter (Signed)
   Pre-operative Risk Assessment    Patient Name: Nicole [name]  DOB: 1951/01/08 MRN: [id number]   Date of last office visit: 05/01/2023 Date of next office visit: N/A   Request for Surgical Clearance    Procedure:  LEFT TOTAL HIP ARTHROPLASTY  Date of Surgery:  Clearance 06/23/23                                Surgeon:  Norberto Bear, MD Surgeon's Group or Practice Name:  [facility] Phone number:  [phone] Fax number:  [phone]   Type of Clearance Requested:   - Medical  - Pharmacy:  Hold Apixaban  (Eliquis ) X'S 3 DAYS  (CONFIRMED WITH [facility] AT SURGEON'S OFFICE, THEY HAVE ALREADY ADVISED PT TO STOP ELIQUIS     Type of Anesthesia:  Spinal   Additional requests/questions:    Signed, Maeola Schmidt   06/22/2023, 11:02 AM

## 2023-06-22 NOTE — H&P (Signed)
 TOTAL HIP ADMISSION H&P  Patient is admitted for left total hip arthroplasty.  Subjective:  Chief Complaint: left hip pain  HPI: Nicole [name], 73 y.o. female, has a history of pain and functional disability in the left hip(s) due to avascular necrosis and patient has failed non-surgical conservative treatments for greater than 12 weeks to include NSAID's and/or analgesics, use of assistive devices, and activity modification.  Onset of symptoms was gradual starting 3 years ago with rapidlly worsening course since that time.The patient noted no past surgery on the left hip(s).  Patient currently rates pain in the left hip at 10 out of 10 with activity. Patient has night pain, worsening of pain with activity and weight bearing, trendelenberg gait, pain that interfers with activities of daily living, and pain with passive range of motion. Patient has evidence of subchondral cysts and joint space narrowing by imaging studies. This condition presents safety issues increasing the risk of falls. This patient has had avascular necrosis of the hip, acetabular fracture, hip dysplasia.  There is no current active infection.  Patient Active Problem List   Diagnosis Date Noted   Permanent atrial fibrillation (HCC) 05/01/2023   HTN (hypertension) 05/01/2023   Hyperlipidemia associated with type 2 diabetes mellitus (HCC) 10/20/2022   Diabetic nephropathy associated with type 2 diabetes mellitus (HCC) 07/25/2022   Age-related cataract of both eyes 07/25/2022   Age-related osteoporosis with current pathological fracture 07/25/2022   Poor dentition 07/25/2022   Hypertriglyceridemia 06/15/2022   Diabetes mellitus (HCC) 06/08/2022   Avascular necrosis of bone of left hip (HCC) 02/04/2022   Arthritis of left hip 02/04/2022   Vitamin D  deficiency 10/16/2020   Cirrhosis of liver (HCC) 10/16/2020   Seizure disorder (HCC) 10/16/2020   Chronic heart failure (HCC) 10/16/2020   Athscl heart disease of native  coronary artery w/o ang pctrs 10/16/2020   Gastro-esophageal reflux disease without esophagitis 10/16/2020   Chronic pain syndrome 10/16/2020   Hypertensive heart disease with heart failure (HCC) 02/15/2020   History of ETOH abuse 12/24/2019   Hypoalbuminemia 12/24/2019   Congestive heart failure (CHF) (HCC) 12/24/2019   Anasarca 12/24/2019   Increased ammonia level    Palliative care by specialist    Goals of care, counseling/discussion    AKI (acute kidney injury) (HCC)    Hepatic encephalopathy (HCC) 11/01/2019   Hypotension 11/01/2019   Jaundice 11/01/2019   Hyperbilirubinemia 11/01/2019   Hyperammonemia (HCC) 11/01/2019   Alcoholic hepatitis 11/01/2019   Acute liver failure 10/31/2019   Sinus tachycardia 08/09/2019   Severe protein-calorie malnutrition (HCC) 08/07/2019   Hypomagnesemia 08/06/2019   H/O right nephrectomy 08/06/2019   Chronic diarrhea 08/06/2019   Closed fracture of left tibial plateau 07/02/2018   Traumatic hematoma of cheek 11/15/2017   Orbital floor (blow-out) closed fracture (HCC) 11/15/2017   Cancer of right kidney (HCC) 09/11/2017   Thyroid  nodule 01/04/2017   Pancreatic pseudocyst 01/04/2017   Acute respiratory failure with hypoxia (HCC)    Epigastric abdominal tenderness 12/18/2016   Gastric mass 12/17/2016   Right renal mass 12/17/2016   Diverticulosis of sigmoid colon 12/17/2016   Hiatal hernia 12/17/2016   Chronic alcoholism (HCC) 12/17/2016   Chronic left shoulder pain 12/17/2016   Arthritis 12/17/2016   Hyponatremia 12/17/2016   Hypokalemia 12/17/2016   Anemia 12/17/2016   Atrial fibrillation with rapid ventricular response (HCC) 12/17/2016   Past Medical History:  Diagnosis Date   Anemia    Arthritis    Asthma    as a child  Atrial fibrillation with RVR (HCC)    Cancer (HCC)    right kidney   CHF (congestive heart failure) (HCC)    Chronic alcoholism (HCC)    Chronic kidney disease    has solitary kidney ( right nephrectomy  for cancer)   Chronic left shoulder pain    Depression    Diabetes mellitus without complication (HCC)    Dysrhythmia    Atrial Fib with RVR    GERD (gastroesophageal reflux disease)    Heartburn    History of blood transfusion    History of hiatal hernia    Hypertension    Liver cirrhosis, alcoholic (HCC)    Memory loss    Schizophrenia (HCC)    patient denies having   Seizures (HCC)    last 2024, no meds and she refuses to see neurology   Thyroid  nodule     Past Surgical History:  Procedure Laterality Date   ANKLE FRACTURE SURGERY Right 2008   screws   ANKLE FRACTURE SURGERY Left 2008   plates   ESOPHAGOGASTRODUODENOSCOPY (EGD) WITH PROPOFOL  N/A 12/18/2016   Procedure: ESOPHAGOGASTRODUODENOSCOPY (EGD) WITH PROPOFOL ;  Surgeon: Baldo Bonds, MD;  Location: [facility] ENDOSCOPY;  Service: Endoscopy;  Laterality: N/A;   JOINT REPLACEMENT Left    total knee arthroplasty done at [facility], can't remember when it was   NASAL SINUS SURGERY Left 11/17/2017   Procedure: ENDOSCOPIC SINUS approach;  Surgeon: Virgina Grills, MD;  Location: [facility] OR;  Service: ENT;  Laterality: Left;   NEPHRECTOMY Right    robotic right radical  for cancer.   ORIF ORBITAL FRACTURE Left 11/17/2017   Procedure: Trans Antral Left sided orbital floor fracture repair with dissovable floor implant;  Surgeon: Virgina Grills, MD;  Location: [facility] OR;  Service: ENT;  Laterality: Left;   TONSILLECTOMY      No current facility-administered medications for this encounter.   Current Outpatient Medications  Medication Sig Dispense Refill Last Dose/Taking   acetaminophen  (TYLENOL ) 500 MG tablet Take 500 mg by mouth 2 (two) times daily.   Taking   Ascorbic Acid (VITAMIN C) 1000 MG tablet Take 1,000 mg by mouth in the morning.   Taking   b complex vitamins capsule Take 1 capsule by mouth daily.   Taking   Calcium  Carbonate (CALCIUM  600 PO) Take 600 mg by mouth in the morning and at bedtime.   Taking   Cholecalciferol (VITAMIN D3)  125 MCG (5000 UT) TABS Take 5,000 Units by mouth daily.   Taking   diclofenac Sodium (VOLTAREN) 1 % GEL Apply 2 g topically 4 (four) times daily as needed.   Taking As Needed   digoxin  (LANOXIN ) 0.125 MG tablet Take 0.5 tablets (0.0625 mg total) by mouth daily. 45 tablet 3 Taking   ELIQUIS  5 MG TABS tablet TAKE 1 TABLET(5 MG) BY MOUTH TWICE DAILY 180 tablet 0 Taking   fexofenadine (ALLEGRA) 180 MG tablet Take 180 mg by mouth daily as needed for allergies or rhinitis.   Taking As Needed   lidocaine  4 % Place 1 patch onto the skin as needed (pain.).   Taking As Needed   lisinopril  (ZESTRIL ) 2.5 MG tablet TAKE 1 TABLET(2.5 MG) BY MOUTH DAILY 90 tablet 1 Taking   loperamide (IMODIUM) 2 MG capsule Take 2-4 mg by mouth 4 (four) times daily as needed for diarrhea or loose stools.   Taking As Needed   MAGNESIUM  PO Take 500 mg by mouth in the morning and at bedtime.   Taking   metFORMIN  (  GLUCOPHAGE ) 500 MG tablet TAKE 2 TABLETS BY MOUTH WITH BREAKFAST AND 1 TABLET WITH EVENING MEAL 270 tablet 1 Taking   metoprolol  tartrate (LOPRESSOR ) 50 MG tablet TAKE 1 TABLET(50 MG) BY MOUTH TWICE DAILY 180 tablet 1 Taking   mirtazapine  (REMERON ) 15 MG tablet TAKE 1 TABLET(15 MG) BY MOUTH AT BEDTIME 90 tablet 0 Taking   Omega-3 Fatty Acids (FISH OIL) 1200 MG CAPS Take 1,200 mg by mouth in the morning, at noon, and at bedtime.   Taking   POTASSIUM PO Take 595 mg by mouth in the morning and at bedtime.   Taking   Propylene Glycol (SYSTANE COMPLETE) 0.6 % SOLN Place 1 drop into both eyes See admin instructions. Instill one drop into both eyes scheduled twice daily, may use as needed for dry eyes   Taking   rosuvastatin (CRESTOR) 5 MG tablet TAKE 1 TABLET BY MOUTH DAILY 90 tablet 1 Taking   Specialty Vitamins Products (MENOPAUSE SUPPORT PO) Take 1 tablet by mouth daily.  Gynovite Plus - Menopause Supplement for Women   Taking   Turmeric (QC TUMERIC COMPLEX PO) Take 1,000 mg by mouth in the morning and at bedtime.   Taking    Accu-Chek Softclix Lancets lancets USE ONE LANCET IN THE MORNING, AT NOON, AND AT BEDTIME 300 each 3    Blood Glucose Monitoring Suppl DEVI 1 each by Does not apply route in the morning, at noon, and at bedtime. May substitute to any manufacturer covered by patient's insurance. 1 each 0    glucose blood (ACCU-CHEK GUIDE TEST) test strip USE ONE STRIP IN THE MORNING, AT NOON, AND AT BEDTIME 300 strip 3    icosapent  Ethyl (VASCEPA ) 1 g capsule Take 2 capsules (2 g total) by mouth 2 (two) times daily. 120 capsule 3 Not Taking   Allergies  Allergen Reactions   Other Shortness Of Breath and Anxiety    UNSPECIFIED REACTION to "fast foods and processed food" like hot dogs   Shellfish Allergy Shortness Of Breath   Iodinated Contrast Media Swelling    Hand swelling from contrast dye for MRI    Social History   Tobacco Use   Smoking status: Former    Current packs/day: 0.00    Types: Cigarettes    Start date: 11/13/1992    Quit date: 11/14/2007    Years since quitting: 15.6   Smokeless tobacco: Never  Substance Use Topics   Alcohol  use: Not Currently    Comment: none since fall 11/13/17    Family History  Problem Relation Age of Onset   Stroke Mother    Heart attack Father    Ulcers Father    Diabetes Sister    Cancer Brother    Diabetes Sister    Diabetes Sister      Review of Systems  Objective:  Physical Exam Vitals reviewed.  Constitutional:      Appearance: Normal appearance. She is normal weight.  HENT:     Head: Normocephalic and atraumatic.  Eyes:     Extraocular Movements: Extraocular movements intact.     Pupils: Pupils are equal, round, and reactive to light.  Cardiovascular:     Rate and Rhythm: Normal rate. Rhythm irregular.  Pulmonary:     Effort: Pulmonary effort is normal.     Breath sounds: Normal breath sounds.  Abdominal:     Palpations: Abdomen is soft.  Musculoskeletal:     Cervical back: Normal range of motion and neck supple.     Left  hip:  Tenderness and bony tenderness present. Decreased range of motion. Decreased strength.  Neurological:     Mental Status: She is alert and oriented to person, place, and time.  Psychiatric:        Behavior: Behavior normal.     Vital signs in last 24 hours:    Labs:   Estimated body mass index is 23.49 kg/m as calculated from the following:   Height as of 06/20/23: 5\' 7"  (1.702 m).   Weight as of 06/20/23: 68 kg.   Imaging Review Plain radiographs demonstrate severe AVN of the left hip(s). The bone quality appears to be good for age and reported activity level.      Assessment/Plan:  Avascular necrosis, left hip(s)  The patient history, physical examination, clinical judgement of the provider and imaging studies are consistent with AVN of the left hip(s) and total hip arthroplasty is deemed medically necessary. The treatment options including medical management, injection therapy, arthroscopy and arthroplasty were discussed at length. The risks and benefits of total hip arthroplasty were presented and reviewed. The risks due to aseptic loosening, infection, stiffness, dislocation/subluxation,  thromboembolic complications and other imponderables were discussed.  The patient acknowledged the explanation, agreed to proceed with the plan and consent was signed. Patient is being admitted for inpatient treatment for surgery, pain control, PT, OT, prophylactic antibiotics, VTE prophylaxis, progressive ambulation and ADL's and discharge planning.The patient is planning to be discharged home with home health services

## 2023-06-22 NOTE — Telephone Encounter (Signed)
 Pt has been scheduled for a tele visit today, 06/22/23, 2:00.  Consent on file / medications reconciled.

## 2023-06-22 NOTE — Progress Notes (Signed)
 Anesthesia Chart Review   Case: [id number] Date/Time: 06/23/23 0700   Procedure: ARTHROPLASTY, HIP, TOTAL, ANTERIOR APPROACH (Left: Hip)   Anesthesia type: Spinal   Diagnosis: Avascular necrosis of bone of hip, left (HCC) [M87.052]   Pre-op diagnosis: Avascular Necrosis Left Hip   [facility] ROOM 09 / [facility]   Surgeons: Arnie Lao, MD       DISCUSSION:73 y.o. former smoker with h/o HTN, a-fib, CHF, CKD, renal cancer s/p right nephrectomy 2019, DM II, alcoholic liver cirrhosis (pt reports sober for a long period of time), AVN left hip scheduled for above procedure 06/23/2023 with Dr. Lucienne Ryder.   Pt reports last dose of Eliquis  06/19/2023.   Pt last seen by cardiology 05/01/2023. Pt with permanent a-fib, asymptomatic on metoprolol , digoxin , Eliquis .  HTN well controlled at this visit. 6 month follow up recommended.   Cardio clearance requested.   Pt seen by cardiology 06/22/2023. Per OV note, "Chart reviewed as part of pre-operative protocol coverage. According to the RCRI, patient has a 6.6% risk of MACE. Patient reports activity equivalent to 4.06 METS (per DASI).    Given past medical history and time since last visit, based on ACC/AHA guidelines, Nicole [name] would be at acceptable risk for the planned procedure without further cardiovascular testing." VS: BP (!) 138/90 Comment: right arm sitting  Pulse 79   Temp 36.5 C (Oral)   Resp 20   Ht 5\' 7"  (1.702 m)   Wt 68 kg   SpO2 98%   BMI 23.49 kg/m   PROVIDERS: Abram Abraham, NP-C is PCP   Cardiologist - Kelly Patient, MD  LABS: Labs reviewed: Acceptable for surgery. (all labs ordered are listed, but only abnormal results are displayed)  Labs Reviewed  CBC - Abnormal; Notable for the following components:      Result Value   Platelets 142 (*)    All other components within normal limits  COMPREHENSIVE METABOLIC PANEL WITH GFR - Abnormal; Notable for the following components:   Sodium 134 (*)    CO2 20  (*)    Glucose, Bld 104 (*)    BUN 29 (*)    All other components within normal limits  GLUCOSE, CAPILLARY - Abnormal; Notable for the following components:   Glucose-Capillary 102 (*)    All other components within normal limits  SURGICAL PCR SCREEN  HEMOGLOBIN A1C  TYPE AND SCREEN     IMAGES:   EKG:   CV: Echo 12/24/2019 Summary   1. The left ventricle is normal in size with upper normal wall thickness.    2. The left ventricular systolic function is overall normal, LVEF is  visually estimated at 50-55%.    3. There is mild mitral valve regurgitation.    4. The left atrium is severely dilated in size.    5. The right ventricle is mildly dilated in size, with mildly reduced  systolic function.    6. The right atrium is severely dilated  in size.   Echo 12/23/2016 Study Conclusions   - Left ventricle: The cavity size was normal. Wall thickness was    normal. Systolic function was normal. The estimated ejection    fraction was in the range of 55% to 60%. Wall motion was normal;    there were no regional wall motion abnormalities.   Past Medical History:  Diagnosis Date   Anemia    Arthritis    Asthma    as a child   Atrial fibrillation with RVR (  HCC)    Cancer (HCC)    right kidney   CHF (congestive heart failure) (HCC)    Chronic alcoholism (HCC)    Chronic kidney disease    has solitary kidney ( right nephrectomy for cancer)   Chronic left shoulder pain    Depression    Diabetes mellitus without complication (HCC)    Dysrhythmia    Atrial Fib with RVR    GERD (gastroesophageal reflux disease)    Heartburn    History of blood transfusion    History of hiatal hernia    Hypertension    Liver cirrhosis, alcoholic (HCC)    Memory loss    Schizophrenia (HCC)    patient denies having   Seizures (HCC)    last 2024, no meds and she refuses to see neurology   Thyroid  nodule     Past Surgical History:  Procedure Laterality Date   ANKLE FRACTURE SURGERY  Right 2008   screws   ANKLE FRACTURE SURGERY Left 2008   plates   ESOPHAGOGASTRODUODENOSCOPY (EGD) WITH PROPOFOL  N/A 12/18/2016   Procedure: ESOPHAGOGASTRODUODENOSCOPY (EGD) WITH PROPOFOL ;  Surgeon: Baldo Bonds, MD;  Location[facility] ENDOSCOPY;  Service: Endoscopy;  Laterality: N/A;   JOINT REPLACEMENT Left    total knee arthroplasty done at [facility], can't remember when it was   NASAL SINUS SURGERY Left 11/17/2017   Procedure: ENDOSCOPIC SINUS approach;  Surgeon: Virgina Grills, MD;  Location: [facility] OR;  Service: ENT;  Laterality: Left;   NEPHRECTOMY Right    robotic right radical  for cancer.   ORIF ORBITAL FRACTURE Left 11/17/2017   Procedure: Trans Antral Left sided orbital floor fracture repair with dissovable floor implant;  Surgeon: Virgina Grills, MD;  Location: [facility] OR;  Service: ENT;  Laterality: Left;   TONSILLECTOMY      MEDICATIONS:  Accu-Chek Softclix Lancets lancets   acetaminophen  (TYLENOL ) 500 MG tablet   Ascorbic Acid (VITAMIN C) 1000 MG tablet   b complex vitamins capsule   Blood Glucose Monitoring Suppl DEVI   Calcium  Carbonate (CALCIUM  600 PO)   Cholecalciferol (VITAMIN D3) 125 MCG (5000 UT) TABS   diclofenac Sodium (VOLTAREN) 1 % GEL   digoxin  (LANOXIN ) 0.125 MG tablet   ELIQUIS  5 MG TABS tablet   fexofenadine (ALLEGRA) 180 MG tablet   glucose blood (ACCU-CHEK GUIDE TEST) test strip   icosapent  Ethyl (VASCEPA ) 1 g capsule   lidocaine  4 %   lisinopril  (ZESTRIL ) 2.5 MG tablet   loperamide (IMODIUM) 2 MG capsule   MAGNESIUM  PO   metFORMIN  (GLUCOPHAGE ) 500 MG tablet   metoprolol  tartrate (LOPRESSOR ) 50 MG tablet   mirtazapine  (REMERON ) 15 MG tablet   Omega-3 Fatty Acids (FISH OIL) 1200 MG CAPS   POTASSIUM PO   Propylene Glycol (SYSTANE COMPLETE) 0.6 % SOLN   rosuvastatin (CRESTOR) 5 MG tablet   Specialty Vitamins Products (MENOPAUSE SUPPORT PO)   Turmeric (QC TUMERIC COMPLEX PO)   XIFAXAN  550 MG TABS tablet   No current facility-administered medications for  this encounter.    Chick Cotton Ward, PA-C [facility] [phone]

## 2023-06-22 NOTE — Telephone Encounter (Signed)
 [facility] pre-op call completed.

## 2023-06-22 NOTE — Telephone Encounter (Signed)
 Pt has been scheduled for a tele visit today, 06/22/23, 2:00.  Consent on file / medications reconciled.     Patient Consent for Virtual Visit        Nicole [name] has provided verbal consent on 06/22/2023 for a virtual visit (video or telephone).   CONSENT FOR VIRTUAL VISIT FOR:  Nicole [name]  By participating in this virtual visit I agree to the following:  I hereby voluntarily request, consent and authorize [facility] and its employed or contracted physicians, physician assistants, nurse practitioners or other licensed health care professionals (the Practitioner), to provide me with telemedicine health care services (the "Services") as deemed necessary by the treating Practitioner. I acknowledge and consent to receive the Services by the Practitioner via telemedicine. I understand that the telemedicine visit will involve communicating with the Practitioner through live audiovisual communication technology and the disclosure of certain medical information by electronic transmission. I acknowledge that I have been given the opportunity to request an in-person assessment or other available alternative prior to the telemedicine visit and am voluntarily participating in the telemedicine visit.  I understand that I have the right to withhold or withdraw my consent to the use of telemedicine in the course of my care at any time, without affecting my right to future care or treatment, and that the Practitioner or I may terminate the telemedicine visit at any time. I understand that I have the right to inspect all information obtained and/or recorded in the course of the telemedicine visit and may receive copies of available information for a reasonable fee.  I understand that some of the potential risks of receiving the Services via telemedicine include:  Delay or interruption in medical evaluation due to technological equipment failure or disruption; Information transmitted may not be  sufficient (e.g. poor resolution of images) to allow for appropriate medical decision making by the Practitioner; and/or  In rare instances, security protocols could fail, causing a breach of personal health information.  Furthermore, I acknowledge that it is my responsibility to provide information about my medical history, conditions and care that is complete and accurate to the best of my ability. I acknowledge that Practitioner's advice, recommendations, and/or decision may be based on factors not within their control, such as incomplete or inaccurate data provided by me or distortions of diagnostic images or specimens that may result from electronic transmissions. I understand that the practice of medicine is not an exact science and that Practitioner makes no warranties or guarantees regarding treatment outcomes. I acknowledge that a copy of this consent can be made available to me via my patient portal [facility] MyChart), or I can request a printed copy by calling the office of [facility].    I understand that my insurance will be billed for this visit.   I have read or had this consent read to me. I understand the contents of this consent, which adequately explains the benefits and risks of the Services being provided via telemedicine.  I have been provided ample opportunity to ask questions regarding this consent and the Services and have had my questions answered to my satisfaction. I give my informed consent for the services to be provided through the use of telemedicine in my medical care

## 2023-06-22 NOTE — Care Plan (Signed)
 [facility] RNCM call to patient to discuss her upcoming Left total hip arthroplasty with Dr. Lucienne Ryder at [facility] on 06/23/23. She is agreeable to case management. She lives with her ex-husband, who will be assisting at home after surgery. She has a rollator, ramp, BSC, but will need a standard walker after surgery. Referral made to [facility]. Anticipate HHPT will be needed after short hospital stay. Referral made to [facility] after choice provided. Reviewed all post op care instructions and answered questions. Will continue to follow for needs.

## 2023-06-22 NOTE — Telephone Encounter (Signed)
   Name: Nicole [name]  DOB: [birth year]  MRN: [id number]  Primary Cardiologist: Lasalle Pointer, MD   Preoperative team, please contact this patient and set up a phone call appointment for further preoperative risk assessment. Please obtain consent and complete medication review. Thank you for your help.  Patient will need a virtual visit today.   I confirm that guidance regarding antiplatelet and oral anticoagulation therapy has been completed and, if necessary, noted below.  Per request, patient has already been instructed to hold Eliquis  therefore will not get recommendations from Pharm D.   I also confirmed the patient resides in the state of [place] . As per [place] [employer] telemedicine laws, the patient must reside in the state in which the provider is licensed.   Morey Ar, NP 06/22/2023, 11:32 AM [facility]

## 2023-06-22 NOTE — Progress Notes (Signed)
 Virtual Visit via Telephone Note   Because of Nicole [name]'s co-morbid illnesses, she is at least at moderate risk for complications without adequate follow up.  This format is felt to be most appropriate for this patient at this time.  The patient did not have access to video technology/had technical difficulties with video requiring transitioning to audio format only (telephone).  All issues noted in this document were discussed and addressed.  No physical exam could be performed with this format.  Please refer to the patient's chart for her consent to telehealth for [facility].  Evaluation Performed:  Preoperative cardiovascular risk assessment _____________   Date:  06/22/2023   Patient ID:  Nicole [name], DOB Dec 27, 1950, MRN [id number] Patient Location:  Home Provider location:   Office  Primary Care Provider:  Abram Abraham, NP-C Primary Cardiologist:  Lasalle Pointer, MD  Chief Complaint / Patient Profile   73 y.o. y/o female with a h/o permanent A-fib on anticoagulation, chronic diastolic heart failure, hypertension, hypertriglyceridemia, hyperlipidemia, GERD, cirrhosis of the liver, T2DM, kidney cancer who is pending left total hip arthroplasty by Dr. Lucienne Ryder and presents today for telephonic preoperative cardiovascular risk assessment.  History of Present Illness    Nicole [name] is a 73 y.o. female who presents via audio/video conferencing for a telehealth visit today.  Pt was last seen in cardiology clinic on 05/01/2023 by Dr. Mallipeddi.  At that time Nicole [name] was stable from a cardiac standpoint.  The patient is now pending procedure as outlined above. Since her last visit, she is doing well. Patient denies shortness of breath, dyspnea on exertion, lower extremity edema, orthopnea or PND. No chest pain, pressure, or tightness. No palpitations.  She does not follow a routine exercise program but she is active in her home. She is independent with  ADLs and able to perform light to moderate household chores.   Past Medical History    Past Medical History:  Diagnosis Date   Anemia    Arthritis    Asthma    as a child   Atrial fibrillation with RVR (HCC)    Cancer (HCC)    right kidney   CHF (congestive heart failure) (HCC)    Chronic alcoholism (HCC)    Chronic kidney disease    has solitary kidney ( right nephrectomy for cancer)   Chronic left shoulder pain    Depression    Diabetes mellitus without complication (HCC)    Dysrhythmia    Atrial Fib with RVR    GERD (gastroesophageal reflux disease)    Heartburn    History of blood transfusion    History of hiatal hernia    Hypertension    Liver cirrhosis, alcoholic (HCC)    Memory loss    Schizophrenia (HCC)    patient denies having   Seizures (HCC)    last 2024, no meds and she refuses to see neurology   Thyroid  nodule    Past Surgical History:  Procedure Laterality Date   ANKLE FRACTURE SURGERY Right 2008   screws   ANKLE FRACTURE SURGERY Left 2008   plates   ESOPHAGOGASTRODUODENOSCOPY (EGD) WITH PROPOFOL  N/A 12/18/2016   Procedure: ESOPHAGOGASTRODUODENOSCOPY (EGD) WITH PROPOFOL ;  Surgeon: Baldo Bonds, MD;  Location: [facility] ENDOSCOPY;  Service: Endoscopy;  Laterality: N/A;   JOINT REPLACEMENT Left    total knee arthroplasty done at [facility], can't remember when it was   NASAL SINUS SURGERY Left 11/17/2017   Procedure: ENDOSCOPIC SINUS approach;  Surgeon: Virgina Grills, MD;  Location: [facility] OR;  Service: ENT;  Laterality: Left;   NEPHRECTOMY Right    robotic right radical  for cancer.   ORIF ORBITAL FRACTURE Left 11/17/2017   Procedure: Trans Antral Left sided orbital floor fracture repair with dissovable floor implant;  Surgeon: Virgina Grills, MD;  Location: [facility] OR;  Service: ENT;  Laterality: Left;   TONSILLECTOMY      Allergies  Allergies  Allergen Reactions   Other Shortness Of Breath and Anxiety    UNSPECIFIED REACTION to "fast foods and processed  food" like hot dogs   Shellfish Allergy Shortness Of Breath   Iodinated Contrast Media Swelling    Hand swelling from contrast dye for MRI    Home Medications    Prior to Admission medications   Medication Sig Start Date End Date Taking? Authorizing Provider  Accu-Chek Softclix Lancets lancets USE ONE LANCET IN THE MORNING, AT NOON, AND AT BEDTIME 04/27/23   Henson, [name], NP-C  acetaminophen  (TYLENOL ) 500 MG tablet Take 500 mg by mouth 2 (two) times daily.    [provider]  Ascorbic Acid (VITAMIN C) 1000 MG tablet Take 1,000 mg by mouth in the morning.    [provider]  b complex vitamins capsule Take 1 capsule by mouth daily.    [provider]  Blood Glucose Monitoring Suppl DEVI 1 each by Does not apply route in the morning, at noon, and at bedtime. May substitute to any manufacturer covered by patient's insurance. 06/08/22   Henson, [name], NP-C  Calcium  Carbonate (CALCIUM  600 PO) Take 600 mg by mouth in the morning and at bedtime.    [provider]  Cholecalciferol (VITAMIN D3) 125 MCG (5000 UT) TABS Take 5,000 Units by mouth daily.    [provider]  diclofenac Sodium (VOLTAREN) 1 % GEL Apply 2 g topically 4 (four) times daily as needed. 12/17/19   [provider]  digoxin  (LANOXIN ) 0.125 MG tablet Take 0.5 tablets (0.0625 mg total) by mouth daily. 05/01/23   Mallipeddi, Vishnu P, MD  ELIQUIS  5 MG TABS tablet TAKE 1 TABLET(5 MG) BY MOUTH TWICE DAILY 04/27/23   Henson, [name], NP-C  fexofenadine (ALLEGRA) 180 MG tablet Take 180 mg by mouth daily as needed for allergies or rhinitis.    [provider]  glucose blood (ACCU-CHEK GUIDE TEST) test strip USE ONE STRIP IN THE MORNING, AT NOON, AND AT BEDTIME 04/27/23   Henson, [name], NP-C  icosapent  Ethyl (VASCEPA ) 1 g capsule Take 2 capsules (2 g total) by mouth 2 (two) times daily. 05/01/23   Mallipeddi, Vishnu P, MD  lidocaine  4 % Place 1 patch onto the skin as needed  (pain.).    [provider]  lisinopril  (ZESTRIL ) 2.5 MG tablet TAKE 1 TABLET(2.5 MG) BY MOUTH DAILY 04/12/23   Henson, [name], NP-C  loperamide (IMODIUM) 2 MG capsule Take 2-4 mg by mouth 4 (four) times daily as needed for diarrhea or loose stools.    [provider]  MAGNESIUM  PO Take 500 mg by mouth in the morning and at bedtime.    [provider]  metFORMIN  (GLUCOPHAGE ) 500 MG tablet TAKE 2 TABLETS BY MOUTH WITH BREAKFAST AND 1 TABLET WITH EVENING MEAL 02/24/23   Henson, [name], NP-C  metoprolol  tartrate (LOPRESSOR ) 50 MG tablet TAKE 1 TABLET(50 MG) BY MOUTH TWICE DAILY 01/20/23   Henson, [name], NP-C  mirtazapine  (REMERON ) 15 MG tablet TAKE 1 TABLET(15 MG) BY  MOUTH AT BEDTIME 04/25/23   Henson, [name], NP-C  Omega-3 Fatty Acids (FISH OIL) 1200 MG CAPS Take 1,200 mg by mouth in the morning, at noon, and at bedtime.    [provider]  POTASSIUM PO Take 595 mg by mouth in the morning and at bedtime.    [provider]  Propylene Glycol (SYSTANE COMPLETE) 0.6 % SOLN Place 1 drop into both eyes See admin instructions. Instill one drop into both eyes scheduled twice daily, may use as needed for dry eyes    [provider]  rosuvastatin (CRESTOR) 5 MG tablet TAKE 1 TABLET BY MOUTH DAILY 04/03/23   Henson, [name], NP-C  Specialty Vitamins Products (MENOPAUSE SUPPORT PO) Take 1 tablet by mouth daily.  Gynovite Plus - Menopause Supplement for Women    [provider]  Turmeric (QC TUMERIC COMPLEX PO) Take 1,000 mg by mouth in the morning and at bedtime.    [provider]    Physical Exam    Vital Signs:  Nicole [name] does not have vital signs available for review today.  Given telephonic nature of communication, physical exam is limited. AAOx3. NAD. Normal affect.  Speech and respirations are unlabored.   Assessment & Plan    Primary Cardiologist: Vishnu P Mallipeddi, MD  Preoperative cardiovascular risk  assessment.  Left total hip arthroplasty by Dr. Lucienne Ryder on 06/23/2023.  Chart reviewed as part of pre-operative protocol coverage. According to the RCRI, patient has a 6.6% risk of MACE. Patient reports activity equivalent to 4.06 METS (per DASI).   Given past medical history and time since last visit, based on ACC/AHA guidelines, Nicole [name] would be at acceptable risk for the planned procedure without further cardiovascular testing.   Patient was advised that if she develops new symptoms prior to surgery to contact our office to arrange a follow-up appointment.  she verbalized understanding.  Per request from Dr. Arvella Bird office patient has already been instructed to hold Eliquis  so recommendations have not been provided by Nicole [name].Ds.  I will route this recommendation to the requesting party via Epic fax function.  Please call with questions.  Time:   Today, I have spent 5 minutes with the patient with telehealth technology discussing medical history, symptoms, and management plan.     Morey Ar, NP  06/22/2023, 11:44 AM

## 2023-06-23 ENCOUNTER — Other Ambulatory Visit: Payer: Self-pay

## 2023-06-23 ENCOUNTER — Ambulatory Visit (HOSPITAL_COMMUNITY): Payer: Self-pay | Admitting: Physician Assistant

## 2023-06-23 ENCOUNTER — Ambulatory Visit (HOSPITAL_COMMUNITY)

## 2023-06-23 ENCOUNTER — Inpatient Hospital Stay (HOSPITAL_COMMUNITY)
Admission: RE | Admit: 2023-06-23 | Discharge: 2023-06-27 | DRG: 470 | Disposition: A | Discharge status: Home Health | Attending: Orthopaedic Surgery | Admitting: Orthopaedic Surgery

## 2023-06-23 ENCOUNTER — Observation Stay (HOSPITAL_COMMUNITY)

## 2023-06-23 ENCOUNTER — Ambulatory Visit (HOSPITAL_COMMUNITY): Payer: Self-pay | Admitting: Certified Registered"

## 2023-06-23 ENCOUNTER — Encounter (HOSPITAL_COMMUNITY)
Admission: RE | Disposition: A | Discharge status: Home Health | Payer: Self-pay | Source: Home / Self Care | Attending: Orthopaedic Surgery

## 2023-06-23 ENCOUNTER — Encounter (HOSPITAL_COMMUNITY): Payer: Self-pay | Admitting: Orthopaedic Surgery

## 2023-06-23 DIAGNOSIS — I509 Heart failure, unspecified: Secondary | ICD-10-CM | POA: Diagnosis not present

## 2023-06-23 DIAGNOSIS — I2510 Atherosclerotic heart disease of native coronary artery without angina pectoris: Secondary | ICD-10-CM | POA: Diagnosis present

## 2023-06-23 DIAGNOSIS — Z8249 Family history of ischemic heart disease and other diseases of the circulatory system: Secondary | ICD-10-CM

## 2023-06-23 DIAGNOSIS — M87052 Idiopathic aseptic necrosis of left femur: Secondary | ICD-10-CM | POA: Diagnosis not present

## 2023-06-23 DIAGNOSIS — Z905 Acquired absence of kidney: Secondary | ICD-10-CM | POA: Diagnosis not present

## 2023-06-23 DIAGNOSIS — E119 Type 2 diabetes mellitus without complications: Secondary | ICD-10-CM

## 2023-06-23 DIAGNOSIS — Z79899 Other long term (current) drug therapy: Secondary | ICD-10-CM

## 2023-06-23 DIAGNOSIS — Z823 Family history of stroke: Secondary | ICD-10-CM

## 2023-06-23 DIAGNOSIS — E1122 Type 2 diabetes mellitus with diabetic chronic kidney disease: Secondary | ICD-10-CM | POA: Diagnosis present

## 2023-06-23 DIAGNOSIS — Z96642 Presence of left artificial hip joint: Secondary | ICD-10-CM

## 2023-06-23 DIAGNOSIS — Z7901 Long term (current) use of anticoagulants: Secondary | ICD-10-CM | POA: Diagnosis not present

## 2023-06-23 DIAGNOSIS — Z91013 Allergy to seafood: Secondary | ICD-10-CM | POA: Diagnosis not present

## 2023-06-23 DIAGNOSIS — Z85528 Personal history of other malignant neoplasm of kidney: Secondary | ICD-10-CM | POA: Diagnosis not present

## 2023-06-23 DIAGNOSIS — Z833 Family history of diabetes mellitus: Secondary | ICD-10-CM

## 2023-06-23 DIAGNOSIS — I129 Hypertensive chronic kidney disease with stage 1 through stage 4 chronic kidney disease, or unspecified chronic kidney disease: Secondary | ICD-10-CM | POA: Diagnosis not present

## 2023-06-23 DIAGNOSIS — Z471 Aftercare following joint replacement surgery: Secondary | ICD-10-CM | POA: Diagnosis not present

## 2023-06-23 DIAGNOSIS — Z91041 Radiographic dye allergy status: Secondary | ICD-10-CM

## 2023-06-23 DIAGNOSIS — Z96652 Presence of left artificial knee joint: Secondary | ICD-10-CM | POA: Diagnosis present

## 2023-06-23 DIAGNOSIS — I4891 Unspecified atrial fibrillation: Secondary | ICD-10-CM | POA: Diagnosis not present

## 2023-06-23 DIAGNOSIS — K219 Gastro-esophageal reflux disease without esophagitis: Secondary | ICD-10-CM | POA: Diagnosis not present

## 2023-06-23 DIAGNOSIS — E781 Pure hyperglyceridemia: Secondary | ICD-10-CM | POA: Diagnosis not present

## 2023-06-23 DIAGNOSIS — Z7984 Long term (current) use of oral hypoglycemic drugs: Secondary | ICD-10-CM

## 2023-06-23 DIAGNOSIS — K7030 Alcoholic cirrhosis of liver without ascites: Secondary | ICD-10-CM | POA: Diagnosis present

## 2023-06-23 DIAGNOSIS — Z87891 Personal history of nicotine dependence: Secondary | ICD-10-CM | POA: Diagnosis not present

## 2023-06-23 DIAGNOSIS — N189 Chronic kidney disease, unspecified: Secondary | ICD-10-CM | POA: Diagnosis not present

## 2023-06-23 DIAGNOSIS — I4821 Permanent atrial fibrillation: Secondary | ICD-10-CM | POA: Diagnosis present

## 2023-06-23 DIAGNOSIS — Z01818 Encounter for other preprocedural examination: Secondary | ICD-10-CM

## 2023-06-23 DIAGNOSIS — E1169 Type 2 diabetes mellitus with other specified complication: Secondary | ICD-10-CM | POA: Diagnosis present

## 2023-06-23 DIAGNOSIS — G40909 Epilepsy, unspecified, not intractable, without status epilepticus: Secondary | ICD-10-CM | POA: Diagnosis present

## 2023-06-23 DIAGNOSIS — G894 Chronic pain syndrome: Secondary | ICD-10-CM | POA: Diagnosis not present

## 2023-06-23 DIAGNOSIS — I110 Hypertensive heart disease with heart failure: Secondary | ICD-10-CM | POA: Diagnosis not present

## 2023-06-23 DIAGNOSIS — F1021 Alcohol dependence, in remission: Secondary | ICD-10-CM | POA: Diagnosis present

## 2023-06-23 DIAGNOSIS — M879 Osteonecrosis, unspecified: Principal | ICD-10-CM | POA: Diagnosis present

## 2023-06-23 HISTORY — PX: TOTAL HIP ARTHROPLASTY: SHX124

## 2023-06-23 LAB — BASIC METABOLIC PANEL WITH GFR
Anion gap: 5 (ref 5–15)
BUN: 25 mg/dL — ABNORMAL HIGH (ref 8–23)
CO2: 23 mmol/L (ref 22–32)
Calcium: 8.2 mg/dL — ABNORMAL LOW (ref 8.9–10.3)
Chloride: 104 mmol/L (ref 98–111)
Creatinine, Ser: 0.83 mg/dL (ref 0.44–1.00)
GFR, Estimated: >60 mL/min (ref >60)
Glucose, Bld: 148 mg/dL — ABNORMAL HIGH (ref 70–99)
Potassium: 4.7 mmol/L (ref 3.5–5.1)
Sodium: 132 mmol/L — ABNORMAL LOW (ref 135–145)

## 2023-06-23 LAB — TYPE AND SCREEN
ABO/RH(D): B POS
Antibody Screen: NEGATIVE

## 2023-06-23 LAB — GLUCOSE, CAPILLARY
Glucose-Capillary: 124 mg/dL — ABNORMAL HIGH (ref 70–99)
Glucose-Capillary: 152 mg/dL — ABNORMAL HIGH (ref 70–99)
Glucose-Capillary: 175 mg/dL — ABNORMAL HIGH (ref 70–99)
Glucose-Capillary: 189 mg/dL — ABNORMAL HIGH (ref 70–99)
Glucose-Capillary: 168 mg/dL — ABNORMAL HIGH (ref 70–99)

## 2023-06-23 SURGERY — ARTHROPLASTY, HIP, TOTAL, ANTERIOR APPROACH
Anesthesia: Spinal | Site: Hip | Laterality: Left

## 2023-06-23 MED ORDER — DIGOXIN 0.0625 MG HALF TABLET
0.0625 mg | ORAL_TABLET | Freq: Every day | ORAL | Status: DC
  Administered 2023-06-24 – 2023-06-27 (×4): 0.0625 mg via ORAL
  Filled 2023-06-23 (×4): qty 1

## 2023-06-23 MED ORDER — DEXAMETHASONE SODIUM PHOSPHATE 10 MG/ML IJ SOLN
INTRAMUSCULAR | Status: AC
  Filled 2023-06-23: qty 1

## 2023-06-23 MED ORDER — MIRTAZAPINE 15 MG PO TABS
15.0000 mg | ORAL_TABLET | Freq: Every day | ORAL | Status: DC
  Administered 2023-06-23 – 2023-06-26 (×4): 15 mg via ORAL
  Filled 2023-06-23 (×4): qty 1

## 2023-06-23 MED ORDER — CEFAZOLIN SODIUM-DEXTROSE 2-4 GM/100ML-% IV SOLN
2.0000 g | Freq: Four times a day (QID) | INTRAVENOUS | Status: AC
  Administered 2023-06-23: 2 g via INTRAVENOUS
  Filled 2023-06-23: qty 100

## 2023-06-23 MED ORDER — OXYCODONE HCL 5 MG PO TABS: 5.0000 mg | ORAL_TABLET | Freq: Once | ORAL | Status: DC | PRN

## 2023-06-23 MED ORDER — LIDOCAINE HCL (CARDIAC) PF 100 MG/5ML IV SOSY
PREFILLED_SYRINGE | INTRAVENOUS | Status: DC | PRN
  Administered 2023-06-23: 40 mg via INTRAVENOUS

## 2023-06-23 MED ORDER — DROPERIDOL 2.5 MG/ML IJ SOLN: 0.6250 mg | Freq: Once | INTRAMUSCULAR | Status: DC | PRN

## 2023-06-23 MED ORDER — INSULIN ASPART 100 UNIT/ML IJ SOLN: 0.0000 [IU] | Freq: Every day | INTRAMUSCULAR | Status: DC

## 2023-06-23 MED ORDER — LACTATED RINGERS IV SOLN: INTRAVENOUS | Status: DC

## 2023-06-23 MED ORDER — METHOCARBAMOL 1000 MG/10ML IJ SOLN
500.0000 mg | Freq: Four times a day (QID) | INTRAMUSCULAR | Status: DC | PRN
  Administered 2023-06-23: 500 mg via INTRAVENOUS
  Filled 2023-06-23: qty 10

## 2023-06-23 MED ORDER — CEFAZOLIN SODIUM-DEXTROSE 2-4 GM/100ML-% IV SOLN
2.0000 g | INTRAVENOUS | Status: AC
  Administered 2023-06-23: 2 g via INTRAVENOUS
  Filled 2023-06-23: qty 100

## 2023-06-23 MED ORDER — METFORMIN HCL 500 MG PO TABS
1000.0000 mg | ORAL_TABLET | Freq: Every day | ORAL | Status: DC
  Administered 2023-06-24 – 2023-06-27 (×4): 1000 mg via ORAL
  Filled 2023-06-23 (×4): qty 2

## 2023-06-23 MED ORDER — FENTANYL CITRATE (PF) 100 MCG/2ML IJ SOLN
INTRAMUSCULAR | Status: DC | PRN
Start: 2023-06-23 — End: 2023-06-23
  Administered 2023-06-23: 50 ug via INTRAVENOUS

## 2023-06-23 MED ORDER — METFORMIN HCL 500 MG PO TABS: 500.0000 mg | ORAL_TABLET | Freq: Two times a day (BID) | ORAL | Status: DC

## 2023-06-23 MED ORDER — METOCLOPRAMIDE HCL 5 MG/ML IJ SOLN: 5.0000 mg | Freq: Three times a day (TID) | INTRAMUSCULAR | Status: DC | PRN

## 2023-06-23 MED ORDER — HYDROMORPHONE HCL 1 MG/ML IJ SOLN
0.5000 mg | INTRAMUSCULAR | Status: DC | PRN
  Administered 2023-06-23: 1 mg via INTRAVENOUS
  Filled 2023-06-23: qty 1

## 2023-06-23 MED ORDER — PHENOL 1.4 % MT LIQD: 1.0000 | OROMUCOSAL | Status: DC | PRN

## 2023-06-23 MED ORDER — STERILE WATER FOR IRRIGATION IR SOLN
Status: DC | PRN
  Administered 2023-06-23: 1000 mL

## 2023-06-23 MED ORDER — SODIUM CHLORIDE 0.9 % IR SOLN
Status: DC | PRN
  Administered 2023-06-23: 1000 mL

## 2023-06-23 MED ORDER — FENTANYL CITRATE (PF) 100 MCG/2ML IJ SOLN
INTRAMUSCULAR | Status: AC
  Filled 2023-06-23: qty 2

## 2023-06-23 MED ORDER — CHLORHEXIDINE GLUCONATE 0.12 % MT SOLN
15.0000 mL | Freq: Once | OROMUCOSAL | Status: AC
  Administered 2023-06-23: 15 mL via OROMUCOSAL

## 2023-06-23 MED ORDER — INSULIN ASPART 100 UNIT/ML IJ SOLN
0.0000 [IU] | Freq: Three times a day (TID) | INTRAMUSCULAR | Status: DC
  Administered 2023-06-23 (×2): 3 [IU] via SUBCUTANEOUS
  Administered 2023-06-24 – 2023-06-25 (×4): 2 [IU] via SUBCUTANEOUS
  Administered 2023-06-25 – 2023-06-26 (×2): 3 [IU] via SUBCUTANEOUS
  Administered 2023-06-26 – 2023-06-27 (×2): 2 [IU] via SUBCUTANEOUS

## 2023-06-23 MED ORDER — DIPHENHYDRAMINE HCL 12.5 MG/5ML PO ELIX: 12.5000 mg | ORAL_SOLUTION | ORAL | Status: DC | PRN

## 2023-06-23 MED ORDER — FENTANYL CITRATE PF 50 MCG/ML IJ SOSY
25.0000 ug | PREFILLED_SYRINGE | INTRAMUSCULAR | Status: DC | PRN
  Administered 2023-06-23 (×2): 25 ug via INTRAVENOUS
  Administered 2023-06-23: 50 ug via INTRAVENOUS

## 2023-06-23 MED ORDER — OXYCODONE HCL 5 MG/5ML PO SOLN: 5.0000 mg | Freq: Once | ORAL | Status: DC | PRN

## 2023-06-23 MED ORDER — ONDANSETRON HCL 4 MG/2ML IJ SOLN: 4.0000 mg | Freq: Four times a day (QID) | INTRAMUSCULAR | Status: DC | PRN

## 2023-06-23 MED ORDER — ORAL CARE MOUTH RINSE: 15.0000 mL | Freq: Once | OROMUCOSAL | Status: AC

## 2023-06-23 MED ORDER — DEXAMETHASONE SODIUM PHOSPHATE 10 MG/ML IJ SOLN
INTRAMUSCULAR | Status: DC | PRN
  Administered 2023-06-23: 4 mg via INTRAVENOUS

## 2023-06-23 MED ORDER — ONDANSETRON HCL 4 MG PO TABS: 4.0000 mg | ORAL_TABLET | Freq: Four times a day (QID) | ORAL | Status: DC | PRN

## 2023-06-23 MED ORDER — ACETAMINOPHEN 500 MG PO TABS
1000.0000 mg | ORAL_TABLET | Freq: Once | ORAL | Status: AC
  Administered 2023-06-23: 1000 mg via ORAL
  Filled 2023-06-23: qty 2

## 2023-06-23 MED ORDER — PHENYLEPHRINE HCL-NACL 20-0.9 MG/250ML-% IV SOLN
INTRAVENOUS | Status: DC | PRN
  Administered 2023-06-23: 20 ug/min via INTRAVENOUS

## 2023-06-23 MED ORDER — PANTOPRAZOLE SODIUM 40 MG PO TBEC
40.0000 mg | DELAYED_RELEASE_TABLET | Freq: Every day | ORAL | Status: DC
  Administered 2023-06-24 – 2023-06-27 (×4): 40 mg via ORAL
  Filled 2023-06-23 (×4): qty 1

## 2023-06-23 MED ORDER — ALUM & MAG HYDROXIDE-SIMETH 200-200-20 MG/5ML PO SUSP: 30.0000 mL | ORAL | Status: DC | PRN

## 2023-06-23 MED ORDER — OXYCODONE HCL 5 MG PO TABS
10.0000 mg | ORAL_TABLET | ORAL | Status: DC | PRN
  Administered 2023-06-23 (×3): 10 mg via ORAL
  Filled 2023-06-23 (×3): qty 2

## 2023-06-23 MED ORDER — ACETAMINOPHEN 325 MG PO TABS
325.0000 mg | ORAL_TABLET | Freq: Four times a day (QID) | ORAL | Status: DC | PRN
  Administered 2023-06-25: 650 mg via ORAL
  Filled 2023-06-23: qty 2

## 2023-06-23 MED ORDER — MIDAZOLAM HCL 2 MG/2ML IJ SOLN
INTRAMUSCULAR | Status: AC
  Filled 2023-06-23: qty 2

## 2023-06-23 MED ORDER — ONDANSETRON HCL 4 MG/2ML IJ SOLN
INTRAMUSCULAR | Status: DC | PRN
  Administered 2023-06-23: 4 mg via INTRAVENOUS

## 2023-06-23 MED ORDER — FENTANYL CITRATE PF 50 MCG/ML IJ SOSY
PREFILLED_SYRINGE | INTRAMUSCULAR | Status: AC
  Filled 2023-06-23: qty 3

## 2023-06-23 MED ORDER — POVIDONE-IODINE 10 % EX SWAB: 2.0000 | Freq: Once | CUTANEOUS | Status: DC

## 2023-06-23 MED ORDER — METFORMIN HCL 500 MG PO TABS
500.0000 mg | ORAL_TABLET | Freq: Every day | ORAL | Status: DC
  Administered 2023-06-23 – 2023-06-26 (×4): 500 mg via ORAL
  Filled 2023-06-23 (×4): qty 1

## 2023-06-23 MED ORDER — BUPIVACAINE IN DEXTROSE 0.75-8.25 % IT SOLN
INTRATHECAL | Status: DC | PRN
  Administered 2023-06-23: 1.6 mL via INTRATHECAL

## 2023-06-23 MED ORDER — ONDANSETRON HCL 4 MG/2ML IJ SOLN
INTRAMUSCULAR | Status: AC
  Filled 2023-06-23: qty 2

## 2023-06-23 MED ORDER — APIXABAN 5 MG PO TABS
5.0000 mg | ORAL_TABLET | Freq: Two times a day (BID) | ORAL | Status: DC
  Administered 2023-06-24 – 2023-06-27 (×7): 5 mg via ORAL
  Filled 2023-06-23 (×7): qty 1

## 2023-06-23 MED ORDER — TRANEXAMIC ACID-NACL 1000-0.7 MG/100ML-% IV SOLN
1000.0000 mg | INTRAVENOUS | Status: AC
Start: 2023-06-23 — End: 2023-06-23
  Administered 2023-06-23: 1000 mg via INTRAVENOUS
  Filled 2023-06-23: qty 100

## 2023-06-23 MED ORDER — PROPOFOL 10 MG/ML IV BOLUS
INTRAVENOUS | Status: DC | PRN
  Administered 2023-06-23: 10 mg via INTRAVENOUS

## 2023-06-23 MED ORDER — 0.9 % SODIUM CHLORIDE (POUR BTL) OPTIME
TOPICAL | Status: DC | PRN
  Administered 2023-06-23: 1000 mL

## 2023-06-23 MED ORDER — METOCLOPRAMIDE HCL 5 MG PO TABS: 5.0000 mg | ORAL_TABLET | Freq: Three times a day (TID) | ORAL | Status: DC | PRN

## 2023-06-23 MED ORDER — MENTHOL 3 MG MT LOZG: 1.0000 | LOZENGE | OROMUCOSAL | Status: DC | PRN

## 2023-06-23 MED ORDER — PROPOFOL 1000 MG/100ML IV EMUL
INTRAVENOUS | Status: AC
Start: 2023-06-23 — End: ?
  Filled 2023-06-23: qty 100

## 2023-06-23 MED ORDER — METHOCARBAMOL 500 MG PO TABS
500.0000 mg | ORAL_TABLET | Freq: Four times a day (QID) | ORAL | Status: DC | PRN
  Administered 2023-06-23 – 2023-06-26 (×7): 500 mg via ORAL
  Filled 2023-06-23 (×7): qty 1

## 2023-06-23 MED ORDER — PROPOFOL 10 MG/ML IV BOLUS
INTRAVENOUS | Status: AC
  Filled 2023-06-23: qty 20

## 2023-06-23 MED ORDER — DOCUSATE SODIUM 100 MG PO CAPS
100.0000 mg | ORAL_CAPSULE | Freq: Two times a day (BID) | ORAL | Status: DC
  Administered 2023-06-23 – 2023-06-27 (×7): 100 mg via ORAL
  Filled 2023-06-23 (×8): qty 1

## 2023-06-23 MED ORDER — SODIUM CHLORIDE 0.9 % IV SOLN: INTRAVENOUS | Status: DC

## 2023-06-23 MED ORDER — METOPROLOL TARTRATE 50 MG PO TABS
50.0000 mg | ORAL_TABLET | Freq: Two times a day (BID) | ORAL | Status: DC
  Administered 2023-06-23 – 2023-06-27 (×8): 50 mg via ORAL
  Filled 2023-06-23 (×8): qty 1

## 2023-06-23 MED ORDER — OXYCODONE HCL 5 MG PO TABS
5.0000 mg | ORAL_TABLET | ORAL | Status: DC | PRN
  Administered 2023-06-24 – 2023-06-25 (×3): 5 mg via ORAL
  Administered 2023-06-25: 10 mg via ORAL
  Administered 2023-06-26 (×2): 5 mg via ORAL
  Filled 2023-06-23: qty 1
  Filled 2023-06-23: qty 2
  Filled 2023-06-23: qty 1
  Filled 2023-06-23: qty 2
  Filled 2023-06-23: qty 1
  Filled 2023-06-23 (×2): qty 2

## 2023-06-23 MED ORDER — PROPOFOL 500 MG/50ML IV EMUL
INTRAVENOUS | Status: DC | PRN
  Administered 2023-06-23: 100 ug/kg/min via INTRAVENOUS

## 2023-06-23 MED ORDER — VITAMIN C 500 MG PO TABS
1000.0000 mg | ORAL_TABLET | Freq: Every day | ORAL | Status: DC
  Administered 2023-06-24 – 2023-06-27 (×4): 1000 mg via ORAL
  Filled 2023-06-23 (×4): qty 2

## 2023-06-23 MED ORDER — EPHEDRINE SULFATE-NACL 50-0.9 MG/10ML-% IV SOSY
PREFILLED_SYRINGE | INTRAVENOUS | Status: DC | PRN
Start: 2023-06-23 — End: 2023-06-23
  Administered 2023-06-23: 5 mg via INTRAVENOUS
  Administered 2023-06-23: 10 mg via INTRAVENOUS
  Administered 2023-06-23 (×2): 5 mg via INTRAVENOUS

## 2023-06-23 MED ORDER — LIDOCAINE HCL (PF) 2 % IJ SOLN
INTRAMUSCULAR | Status: AC
  Filled 2023-06-23: qty 5

## 2023-06-23 MED ORDER — MIDAZOLAM HCL 2 MG/2ML IJ SOLN
INTRAMUSCULAR | Status: DC | PRN
  Administered 2023-06-23 (×2): 1 mg via INTRAVENOUS

## 2023-06-23 MED ORDER — GLYCOPYRROLATE 0.2 MG/ML IJ SOLN
INTRAMUSCULAR | Status: DC | PRN
  Administered 2023-06-23: .2 mg via INTRAVENOUS

## 2023-06-23 SURGICAL SUPPLY — 37 items
BAG COUNTER SPONGE SURGICOUNT (BAG) ×1 IMPLANT
BAG ZIPLOCK 12X15 (MISCELLANEOUS) IMPLANT
BENZOIN TINCTURE PRP APPL 2/3 (GAUZE/BANDAGES/DRESSINGS) IMPLANT
BLADE SAW SGTL 18X1.27X75 (BLADE) ×1 IMPLANT
COVER PERINEAL POST (MISCELLANEOUS) ×1 IMPLANT
COVER SURGICAL LIGHT HANDLE (MISCELLANEOUS) ×1 IMPLANT
CUP ACET PNNCL SECTR W/GRIP 56 (Hips) IMPLANT
DRAPE FOOT SWITCH (DRAPES) ×1 IMPLANT
DRAPE STERI IOBAN 125X83 (DRAPES) ×1 IMPLANT
DRAPE U-SHAPE 47X51 STRL (DRAPES) ×2 IMPLANT
DRSG AQUACEL AG ADV 3.5X10 (GAUZE/BANDAGES/DRESSINGS) ×1 IMPLANT
DURAPREP 26ML APPLICATOR (WOUND CARE) ×1 IMPLANT
ELECT PENCIL ROCKER SW 15FT (MISCELLANEOUS) ×1 IMPLANT
ELECT REM PT RETURN 15FT ADLT (MISCELLANEOUS) ×1 IMPLANT
GAUZE XEROFORM 1X8 LF (GAUZE/BANDAGES/DRESSINGS) IMPLANT
GLOVE BIO SURGEON STRL SZ7.5 (GLOVE) ×1 IMPLANT
GLOVE BIOGEL PI IND STRL 8 (GLOVE) ×2 IMPLANT
GLOVE ECLIPSE 8.0 STRL XLNG CF (GLOVE) ×1 IMPLANT
GOWN STRL REUS W/ TWL XL LVL3 (GOWN DISPOSABLE) ×2 IMPLANT
HEAD M SROM 36MM PLUS 1.5 (Hips) IMPLANT
HOLDER FOLEY CATH W/STRAP (MISCELLANEOUS) ×1 IMPLANT
KIT TURNOVER KIT A (KITS) IMPLANT
PACK ANTERIOR HIP CUSTOM (KITS) ×1 IMPLANT
PINNACLE ALTRX PLUS 4 N 36X56 (Hips) IMPLANT
SCREW 6.5MMX30MM (Screw) IMPLANT
SET HNDPC FAN SPRY TIP SCT (DISPOSABLE) ×1 IMPLANT
STAPLER SKIN PROX 35W (STAPLE) IMPLANT
STEM FEMORAL SZ5 HIGH ACTIS (Stem) IMPLANT
STRIP CLOSURE SKIN 1/2X4 (GAUZE/BANDAGES/DRESSINGS) IMPLANT
SUT ETHIBOND NAB CT1 #1 30IN (SUTURE) ×1 IMPLANT
SUT ETHILON 2 0 PS N (SUTURE) IMPLANT
SUT MNCRL AB 4-0 PS2 18 (SUTURE) IMPLANT
SUT VIC AB 0 CT1 36 (SUTURE) ×1 IMPLANT
SUT VIC AB 1 CT1 36 (SUTURE) ×1 IMPLANT
SUT VIC AB 2-0 CT1 TAPERPNT 27 (SUTURE) ×2 IMPLANT
TRAY FOLEY MTR SLVR 16FR STAT (SET/KITS/TRAYS/PACK) IMPLANT
YANKAUER SUCT BULB TIP NO VENT (SUCTIONS) ×1 IMPLANT

## 2023-06-23 NOTE — Transfer of Care (Signed)
 Immediate Anesthesia Transfer of Care Note  Patient: Nicole [name]  Procedure(s) Performed: ARTHROPLASTY, HIP, TOTAL, ANTERIOR APPROACH (Left: Hip)  Patient Location: PACU  Anesthesia Type:Spinal  Level of Consciousness: awake, alert , and patient cooperative  Airway & Oxygen  Therapy: Patient Spontanous Breathing and Patient connected to face mask oxygen   Post-op Assessment: Report given to RN and Post -op Vital signs reviewed and stable  Post vital signs: Reviewed and stable  Last Vitals:  Vitals Value Taken Time  BP 97/85 06/23/23 0854  Temp    Pulse 71 06/23/23 0857  Resp 25 06/23/23 0857  SpO2 93 % 06/23/23 0857  Vitals shown include unfiled device data.  Last Pain:  Vitals:   06/23/23 0630  TempSrc: Oral  PainSc:       Patients Stated Pain Goal: 4 (06/23/23 0542)  Complications: No notable events documented.

## 2023-06-23 NOTE — Anesthesia Procedure Notes (Signed)
 Procedure Name: MAC Date/Time: 06/23/2023 7:17 AM  Performed by: Alwyn Juba, CRNAPre-anesthesia Checklist: Patient identified, Emergency Drugs available, Suction available, Patient being monitored and Timeout performed Oxygen  Delivery Method: Simple face mask Placement Confirmation: positive ETCO2

## 2023-06-23 NOTE — Addendum Note (Signed)
 Addendum  created 06/23/23 1033 by Alwyn Juba, CRNA   Flowsheet accepted

## 2023-06-23 NOTE — Anesthesia Procedure Notes (Signed)
 Spinal  Patient location during procedure: OR Start time: 06/23/2023 7:18 AM End time: 06/23/2023 7:21 AM Reason for block: surgical anesthesia Staffing Performed: anesthesiologist  Anesthesiologist: Vernadine Golas, MD Performed by: Vernadine Golas, MD Authorized by: Vernadine Golas, MD   Preanesthetic Checklist Completed: patient identified, IV checked, risks and benefits discussed, surgical consent, monitors and equipment checked, pre-op evaluation and timeout performed Spinal Block Patient position: sitting Prep: DuraPrep and site prepped and draped Patient monitoring: continuous pulse ox, blood pressure and heart rate Approach: midline Location: L3-4 Injection technique: single-shot Needle Needle type: Pencan  Needle gauge: 24 G Needle length: 9 cm Assessment Events: CSF return Additional Notes Risks, benefits, and alternative discussed. Patient gave consent to procedure. Prepped and draped in sitting position. Patient sedated but responsive to voice. Clear CSF obtained after one needle pass. Positive terminal aspiration. No pain or paraesthesias with injection. Patient tolerated procedure well. Vital signs stable. Amador Junes, MD

## 2023-06-23 NOTE — Anesthesia Postprocedure Evaluation (Signed)
 Anesthesia Post Note  Patient: Nicole [name]  Procedure(s) Performed: ARTHROPLASTY, HIP, TOTAL, ANTERIOR APPROACH (Left: Hip)     Patient location during evaluation: PACU Anesthesia Type: Spinal Level of consciousness: awake and alert Pain management: pain level controlled Vital Signs Assessment: post-procedure vital signs reviewed and stable Respiratory status: spontaneous breathing, nonlabored ventilation and respiratory function stable Cardiovascular status: blood pressure returned to baseline Postop Assessment: no apparent nausea or vomiting, spinal receding, no headache and no backache Anesthetic complications: no   No notable events documented.  Last Vitals:  Vitals:   06/23/23 0945 06/23/23 1000  BP: 106/88 111/68  Pulse: 79 75  Resp: 17 14  Temp:    SpO2: 100% 100%    Last Pain:  Vitals:   06/23/23 0955  TempSrc:   PainSc: 2                  Rayfield Cairo

## 2023-06-23 NOTE — Op Note (Signed)
 Operative Note  Date of operation: 06/23/2023 Preoperative diagnosis: Left hip avascular necrosis Postoperative diagnosis: Same  Procedure: Left direct anterior total hip arthroplasty  Implants: Implant Name Type Inv. Item Serial No. Manufacturer Lot No. LRB No. Used Action  CUP ACET PNNCL SECTR W/GRIP 56 - [id number] Hips CUP ACET PNNCL SECTR W/GRIP 56  [employer] [id number] Left 1 Implanted  PINNACLE ALTRX PLUS 4 N 36X56 - [id number] Hips PINNACLE ALTRX PLUS 4 N 36X56  [employer] [id number] Left 1 Implanted  SCREW 6.5MMX30MM - [id number] Screw SCREW 6.5MMX30MM  [employer] [id number] Left 1 Implanted  STEM FEMORAL SZ5 HIGH ACTIS - [id number] Stem STEM FEMORAL SZ5 HIGH ACTIS  [employer] [id number] Left 1 Implanted  HEAD M SROM PLUS 1.5 - [id number] Hips HEAD M SROM PLUS 1.5  [employer] [id number] Left 1 Implanted   Surgeon: Jeanella Milan. Lucienne Ryder, MD Assistant: Malena Scull, PA-C  Anesthesia: Spinal EBL: 400 cc Antibiotics: IV Ancef  Complications: None  Indications: The patient is an active 73 year old female with debilitating avascular necrosis involving her left hip with femoral head collapse.  This has been going on for several years now and she definitely has some health issues that need to be corrected.  She is in much better health and is a recovering alcoholic.  She is clean and sober.  Her left hip pain is daily and it is severe.  It is detrimentally affecting her mobility, her activities of daily living and her quality of life and we wish to proceed with a total hip arthroplasty as the she given the severity of her pain and the femoral head collapse from her AVN.  She understands the risks of acute blood loss anemia, nerve vessel injury, fracture, infection, DVT, dislocation, implant failure, leg length differences and wound healing issues.  She understands that our goals are hopefully decreased pain, improved mobility and improved quality of  life.  Procedure description: After informed consent was obtained and the appropriate left hip was marked, the patient was brought to the operating room and set up on the stretcher where spinal anesthesia was obtained.  She was then laid in supine position on the stretcher and a Foley catheter was placed.  Traction boots were next placed on both her feet and she was placed supine on the Hana fracture table with a perineal post in place and both legs in inline skeletal traction devices but no traction applied.  Her left operative hip and pelvis were assessed radiographically.  The left hip was prepped and draped with DuraPrep and sterile drapes.  A timeout was called and she was identified as the correct patient and the correct left hip.  An incision was then made just inferior and posterior to the ASIS and carried slightly obliquely down the leg.  Dissection was carried down to the tensor fascia lata muscle and the tensor fascia was divided longitudinally to proceed with a direct anterior process of the hip.  Circumflex vessels were identified and cauterized.  The hip capsule was identified and opened up in L-type format finding a large joint effusion.  Cobra retractors were placed around the medial and lateral femoral neck and a femoral neck cut was made with an oscillating saw just proximal to the lesser trochanter and this cut was completed with an osteotome.  A corkscrew guide was placed in the femoral head and the femoral head was removed its entirety and it was flattened and had significant evidence of AVN.  There was complete wear of  the cartilage.  A bent Hohmann was then placed over the medial acetabular rim and remnants of the acetabular labrum and other debris were removed.  Reaming was initiated from a size 43 reamer and stepwise undergoes going up to a size 55 reamer with all reamers placed under direct visualization and the last reamer placed under direct fluoroscopy in order to obtain the depth and  reaming, the inclination and anteversion.  The real DePuy sector GRIPTION acetabular opponent size 56 was then placed without difficulty followed by a single screw and a 36+4 polythene liner.  Attention was then turned to the femur.  With the left leg externally rotated to 120 degrees, extended and adducted, the left leg was brought down and under.  A medial loader was placed medially behind the calcar and a long bent Hohmann placement on the greater trochanter.  The lateral joint capsule was released and a box cutting osteotome was used in her femoral canal.  Broaching was then initiated using the Actis broaching system from a size 0 going up to a size 5.  We then trialed a high offset femoral neck and a 36+1.5 trial head ball.  With the left leg brought over and up traction and internal rotation was applied and the patient's hip was reduced.  We assessed it radiographically and clinically and we had a definitely improved her leg length and offset it was very tight on exam as well.  We dislocated the hip remove the trial components.  We placed the real Actis femoral component with high offset for a size 5 followed by a 36+1.5 metal hip ball.  Again the leg was reduced and hip was reduced to the acetabulum and we are pleased with range of motion and stability as well as radiographic assessment of the implants.  The soft tissue was then irrigated with normal saline solution.  Remnants of the joint capsule were closed with interrupted #1 Ethibond suture followed by #1 Vicryl to close the tensor fascia.  0 Vicryl was used to close deep tissue and 2-0 Vicryl was used to close subcutaneous tissue.  The skin was closed with staples.  An Aquacel dressing was applied.  The patient was taken to the recovery room in stable condition.  Malena Scull, PA-C did assist during the entire case and beginning to end and his assistance was crucial and medically necessary for soft tissue management and retraction, helping guide implant  placement and a layered closure of the wound.

## 2023-06-23 NOTE — Evaluation (Signed)
 Physical Therapy Evaluation Patient Details Name: Nicole [name] MRN: [id number] DOB: 1951-02-06 Today's Date: 06/23/2023  History of Present Illness  73 y.o. female s/p Lt THA on 06/23/23.  PMHx: seizures, schizophrenia, HTN, GERD, Afib, CHF, liver cirrhosis, chronic alcoholism, bil ankle fracture surgeries  Clinical Impression  Pt is s/p THA resulting in the deficits listed below (see PT Problem List).  Pt will benefit from acute skilled PT to increase their independence and safety with mobility to facilitate discharge.  Pt requesting to use bathroom for BM on arrival to room.  Pt typically uses manual w/c (which is present in her room today) at home and sometimes ambulates with RW.  Pt assisted with transfers to w/c and toilet however pt having increased pain and felt unable to have BM.  Pt able to propel w/c around hospital room and assisted with transfer to recliner.  Pt reports her husband is also in a power w/c and not able to physically assist so she will need to be to transfer to her w/c independently to d/c safely home (which she hopes is tomorrow).  Pt encouraged to perform ankle pumps and use incentive spirometer while in recliner.         If plan is discharge home, recommend the following:     Can travel by private vehicle        Equipment Recommendations None recommended by PT  Recommendations for Other Services       Functional Status Assessment Patient has had a recent decline in their functional status and demonstrates the ability to make significant improvements in function in a reasonable and predictable amount of time.     Precautions / Restrictions Precautions Precautions: Fall Restrictions Weight Bearing Restrictions Per Provider Order: No Other Position/Activity Restrictions: WBAT      Mobility  Bed Mobility Overal bed mobility: Needs Assistance Bed Mobility: Supine to Sit     Supine to sit: Contact guard, HOB elevated     General bed mobility  comments: utilized gait belt to self assist Lt LE    Transfers Overall transfer level: Needs assistance   Transfers: Sit to/from Stand, Bed to chair/wheelchair/BSC Sit to Stand: Min assist Stand pivot transfers: Min assist         General transfer comment: reliant on UE support of armrests or grab bar; transfered bed to w/c to toilet to w/c to recliner; assist to rise and guide hips for safety    Ambulation/Gait                  Research scientist (physical sciences) Wheelchair mobility: Yes Wheelchair propulsion: Both upper extremities Wheelchair parts: Independent Distance: to bathroom then around bed to recliner Wheelchair Assistance Details (indicate cue type and reason): no cues required, pt very familiar with w/c, safely uses brakes   Tilt Bed    Modified Rankin (Stroke Patients Only)       Balance                                             Pertinent Vitals/Pain Pain Assessment Pain Assessment: 0-10 Pain Score: 10-Worst pain ever Pain Location: left hip Pain Descriptors / Indicators: Burning Pain Intervention(s): Monitored during session, Repositioned, Patient requesting pain meds-RN notified    Home Living Family/patient expects to be discharged to:: Private residence  Living Arrangements: Spouse/significant other   Type of Home: House Home Access: Ramped entrance       Home Layout: One level Home Equipment: Agricultural consultant (2 wheels);BSC/3in1;Wheelchair - manual Additional Comments: reports husband is in power w/c and cannot assist    Prior Function Prior Level of Function : Independent/Modified Independent             Mobility Comments: using mostly w/c and sometimes ambulates with RW       Extremity/Trunk Assessment        Lower Extremity Assessment Lower Extremity Assessment: LLE deficits/detail LLE Deficits / Details: anticipated post op hip weakness and pain LLE: Unable to  fully assess due to pain LLE Sensation: history of peripheral neuropathy (reports bil hx of neuropathy since her ankle fx surgeries)       Communication   Communication Communication: No apparent difficulties    Cognition Arousal: Alert Behavior During Therapy: WFL for tasks assessed/performed   PT - Cognitive impairments: No apparent impairments                         Following commands: Intact       Cueing       General Comments      Exercises     Assessment/Plan    PT Assessment Patient needs continued PT services  PT Problem List Decreased strength;Pain;Decreased activity tolerance;Decreased balance;Decreased mobility;Decreased knowledge of use of DME       PT Treatment Interventions Gait training;DME instruction;Balance training;Functional mobility training;Therapeutic activities;Therapeutic exercise;Patient/family education;Wheelchair mobility training    PT Goals (Current goals can be found in the Care Plan section)  Acute Rehab PT Goals PT Goal Formulation: With patient Time For Goal Achievement: 06/30/23 Potential to Achieve Goals: Good    Frequency 7X/week     Co-evaluation               AM-PAC PT "6 Clicks" Mobility  Outcome Measure Help needed turning from your back to your side while in a flat bed without using bedrails?: A Little Help needed moving from lying on your back to sitting on the side of a flat bed without using bedrails?: A Little Help needed moving to and from a bed to a chair (including a wheelchair)?: A Little Help needed standing up from a chair using your arms (e.g., wheelchair or bedside chair)?: A Little Help needed to walk in hospital room?: A Lot Help needed climbing 3-5 steps with a railing? : A Lot 6 Click Score: 16    End of Session Equipment Utilized During Treatment: Gait belt Activity Tolerance: Patient tolerated treatment well Patient left: in chair;with call bell/phone within reach;with chair  alarm set Nurse Communication: Mobility status;Patient requests pain meds PT Visit Diagnosis: Other abnormalities of gait and mobility (R26.89);Pain Pain - Right/Left: Left Pain - part of body: Hip    Time: 1610-9604 PT Time Calculation (min) (ACUTE ONLY): 18 min   Charges:   PT Evaluation $PT Eval Low Complexity: 1 Low   PT General Charges $$ ACUTE PT VISIT: 1 Visit        Henretta Lodge PT, DPT Physical Therapist Acute Rehabilitation Services Office: [phone]   [name] 06/23/2023, 4:24 PM

## 2023-06-23 NOTE — Interval H&P Note (Signed)
 History and Physical Interval Note: The patient understands that she is here today for a left total hip replacement to treat her significant left hip avascular necrosis and severe left hip pain.  There has been no acute or interval change in her medical status.  The risks and benefits of surgery have been discussed in detail and informed consent has been obtained.  The left operative hip has been marked.  06/23/2023 7:01 AM  Nicole [name]  has presented today for surgery, with the diagnosis of Avascular Necrosis Left Hip.  The various methods of treatment have been discussed with the patient and family. After consideration of risks, benefits and other options for treatment, the patient has consented to  Procedure(s): ARTHROPLASTY, HIP, TOTAL, ANTERIOR APPROACH (Left) as a surgical intervention.  The patient's history has been reviewed, patient examined, no change in status, stable for surgery.  I have reviewed the patient's chart and labs.  Questions were answered to the patient's satisfaction.     Arnie Lao

## 2023-06-24 DIAGNOSIS — I110 Hypertensive heart disease with heart failure: Secondary | ICD-10-CM | POA: Diagnosis present

## 2023-06-24 DIAGNOSIS — F1021 Alcohol dependence, in remission: Secondary | ICD-10-CM | POA: Diagnosis present

## 2023-06-24 DIAGNOSIS — E1169 Type 2 diabetes mellitus with other specified complication: Secondary | ICD-10-CM | POA: Diagnosis present

## 2023-06-24 DIAGNOSIS — G40909 Epilepsy, unspecified, not intractable, without status epilepticus: Secondary | ICD-10-CM | POA: Diagnosis present

## 2023-06-24 DIAGNOSIS — Z87891 Personal history of nicotine dependence: Secondary | ICD-10-CM | POA: Diagnosis not present

## 2023-06-24 DIAGNOSIS — Z823 Family history of stroke: Secondary | ICD-10-CM | POA: Diagnosis not present

## 2023-06-24 DIAGNOSIS — M1612 Unilateral primary osteoarthritis, left hip: Secondary | ICD-10-CM | POA: Diagnosis not present

## 2023-06-24 DIAGNOSIS — Z91013 Allergy to seafood: Secondary | ICD-10-CM | POA: Diagnosis not present

## 2023-06-24 DIAGNOSIS — I4821 Permanent atrial fibrillation: Secondary | ICD-10-CM | POA: Diagnosis present

## 2023-06-24 DIAGNOSIS — K7030 Alcoholic cirrhosis of liver without ascites: Secondary | ICD-10-CM | POA: Diagnosis present

## 2023-06-24 DIAGNOSIS — I2510 Atherosclerotic heart disease of native coronary artery without angina pectoris: Secondary | ICD-10-CM | POA: Diagnosis present

## 2023-06-24 DIAGNOSIS — Z91041 Radiographic dye allergy status: Secondary | ICD-10-CM | POA: Diagnosis not present

## 2023-06-24 DIAGNOSIS — Z8249 Family history of ischemic heart disease and other diseases of the circulatory system: Secondary | ICD-10-CM | POA: Diagnosis not present

## 2023-06-24 DIAGNOSIS — Z905 Acquired absence of kidney: Secondary | ICD-10-CM | POA: Diagnosis not present

## 2023-06-24 DIAGNOSIS — Z96652 Presence of left artificial knee joint: Secondary | ICD-10-CM | POA: Diagnosis present

## 2023-06-24 DIAGNOSIS — Z7984 Long term (current) use of oral hypoglycemic drugs: Secondary | ICD-10-CM | POA: Diagnosis not present

## 2023-06-24 DIAGNOSIS — G894 Chronic pain syndrome: Secondary | ICD-10-CM | POA: Diagnosis present

## 2023-06-24 DIAGNOSIS — Z7901 Long term (current) use of anticoagulants: Secondary | ICD-10-CM | POA: Diagnosis not present

## 2023-06-24 DIAGNOSIS — I509 Heart failure, unspecified: Secondary | ICD-10-CM | POA: Diagnosis present

## 2023-06-24 DIAGNOSIS — E1122 Type 2 diabetes mellitus with diabetic chronic kidney disease: Secondary | ICD-10-CM | POA: Diagnosis present

## 2023-06-24 DIAGNOSIS — Z833 Family history of diabetes mellitus: Secondary | ICD-10-CM | POA: Diagnosis not present

## 2023-06-24 DIAGNOSIS — K219 Gastro-esophageal reflux disease without esophagitis: Secondary | ICD-10-CM | POA: Diagnosis present

## 2023-06-24 DIAGNOSIS — Z85528 Personal history of other malignant neoplasm of kidney: Secondary | ICD-10-CM | POA: Diagnosis not present

## 2023-06-24 DIAGNOSIS — M879 Osteonecrosis, unspecified: Secondary | ICD-10-CM | POA: Diagnosis present

## 2023-06-24 DIAGNOSIS — E781 Pure hyperglyceridemia: Secondary | ICD-10-CM | POA: Diagnosis present

## 2023-06-24 LAB — BASIC METABOLIC PANEL WITH GFR
Anion gap: 7 (ref 5–15)
BUN: 23 mg/dL (ref 8–23)
CO2: 22 mmol/L (ref 22–32)
Calcium: 8.5 mg/dL — ABNORMAL LOW (ref 8.9–10.3)
Chloride: 102 mmol/L (ref 98–111)
Creatinine, Ser: 0.91 mg/dL (ref 0.44–1.00)
GFR, Estimated: >60 mL/min (ref >60)
Glucose, Bld: 125 mg/dL — ABNORMAL HIGH (ref 70–99)
Potassium: 6.2 mmol/L — ABNORMAL HIGH (ref 3.5–5.1)
Sodium: 131 mmol/L — ABNORMAL LOW (ref 135–145)

## 2023-06-24 LAB — CBC
HCT: 32.5 % — ABNORMAL LOW (ref 36.0–46.0)
Hemoglobin: 9.8 g/dL — ABNORMAL LOW (ref 12.0–15.0)
MCH: 30.1 pg (ref 26.0–34.0)
MCHC: 30.2 g/dL (ref 30.0–36.0)
MCV: 99.7 fL (ref 80.0–100.0)
Platelets: 137 10*3/uL — ABNORMAL LOW (ref 150–400)
RBC: 3.26 MIL/uL — ABNORMAL LOW (ref 3.87–5.11)
RDW: 13.3 % (ref 11.5–15.5)
WBC: 10.9 10*3/uL — ABNORMAL HIGH (ref 4.0–10.5)
nRBC: 0 % (ref 0.0–0.2)

## 2023-06-24 LAB — GLUCOSE, CAPILLARY
Glucose-Capillary: 126 mg/dL — ABNORMAL HIGH (ref 70–99)
Glucose-Capillary: 133 mg/dL — ABNORMAL HIGH (ref 70–99)
Glucose-Capillary: 115 mg/dL — ABNORMAL HIGH (ref 70–99)
Glucose-Capillary: 105 mg/dL — ABNORMAL HIGH (ref 70–99)

## 2023-06-24 MED ORDER — KETOROLAC TROMETHAMINE 15 MG/ML IJ SOLN: 7.5000 mg | Freq: Four times a day (QID) | INTRAMUSCULAR | Status: DC

## 2023-06-24 MED ORDER — KETOROLAC TROMETHAMINE 15 MG/ML IJ SOLN
7.5000 mg | Freq: Four times a day (QID) | INTRAMUSCULAR | Status: AC
  Administered 2023-06-24 (×2): 7.5 mg via INTRAVENOUS
  Filled 2023-06-24 (×2): qty 1

## 2023-06-24 MED ORDER — METHOCARBAMOL 500 MG PO TABS: 500.0000 mg | ORAL_TABLET | Freq: Four times a day (QID) | ORAL | 1 refills | Status: DC | PRN

## 2023-06-24 MED ORDER — OXYCODONE HCL 5 MG PO TABS
5.0000 mg | ORAL_TABLET | Freq: Four times a day (QID) | ORAL | 0 refills | Status: DC | PRN
Start: 2023-06-24 — End: 2023-07-06

## 2023-06-24 NOTE — Care Management Obs Status (Signed)
 MEDICARE OBSERVATION STATUS NOTIFICATION   Patient Details  Name: Nicole [name] MRN: [id number] Date of Birth: [birth year]   Medicare Observation Status Notification Given:  Yes    Hanley Rispoli Liane Redman, LCSW 06/24/2023, 10:39 AM

## 2023-06-24 NOTE — Progress Notes (Signed)
 Subjective: 1 Day Post-Op Procedure(s) (LRB): ARTHROPLASTY, HIP, TOTAL, ANTERIOR APPROACH (Left) Patient reports pain as moderate.  Very slow mobility today and still with significant pain.  Objective: Vital signs in last 24 hours: Temp:  [97.4 F (36.3 C)-98.7 F (37.1 C)] 97.4 F (36.3 C) (05/10 0950) Pulse Rate:  [76-100] 99 (05/10 0950) Resp:  [16-18] 16 (05/10 0950) BP: (101-128)/(51-78) 128/71 (05/10 0950) SpO2:  [96 %-100 %] 100 % (05/10 0950)  Intake/Output from previous day: 05/09 0701 - 05/10 0700 In: 3120.6 [P.O.:120; I.V.:2700.6; IV Piggyback:300] Out: 1900 [Urine:1500; Blood:400] Intake/Output this shift: Total I/O In: 180 [P.O.:180] Out: -   Recent Labs    06/24/23 0321  HGB 9.8*   Recent Labs    06/24/23 0321  WBC 10.9*  RBC 3.26*  HCT 32.5*  PLT 137*   Recent Labs    06/23/23 0902 06/24/23 0321  NA 132* 131*  K 4.7 6.2*  CL 104 102  CO2 23 22  BUN 25* 23  CREATININE 0.83 0.91  GLUCOSE 148* 125*  CALCIUM  8.2* 8.5*   No results for input(s): "LABPT", "INR" in the last 72 hours.  Sensation intact distally Intact pulses distally Dorsiflexion/Plantar flexion intact Incision: scant drainage   Assessment/Plan: 1 Day Post-Op Procedure(s) (LRB): ARTHROPLASTY, HIP, TOTAL, ANTERIOR APPROACH (Left) Up with therapy Plan for discharge tomorrow Discharge home with home health  Not safe for d/c home today.    Arnie Lao 06/24/2023, 11:53 AM

## 2023-06-24 NOTE — Progress Notes (Signed)
 Physical Therapy Treatment Patient Details Name: Nicole [name] MRN: [id number] DOB: [birth year] Today's Date: 06/24/2023   History of Present Illness 73 y.o. female s/p Lt THA on 06/23/23.  PMHx: seizures, schizophrenia, HTN, GERD, Afib, CHF, liver cirrhosis, chronic alcoholism, bil ankle fracture surgeries    PT Comments  Pt reports pain still present but more tolerable with activity this afternoon.  Pt assisted to/from w/c and to/from [facility] over toilet.  Pt returned to bed end of session per request and performed LE exercises.  Pt reports her spouse fell trying to come visit her today so he is at home (states he isn't injured).  Pt reports she will need to be independent with manipulating clothing to use bathroom and perform transfers to/from w/c in order to safely d/c home.    If plan is discharge home, recommend the following:     Can travel by private vehicle        Equipment Recommendations  None recommended by PT    Recommendations for Other Services       Precautions / Restrictions Precautions Precautions: Fall Restrictions LLE Weight Bearing Per Provider Order: Weight bearing as tolerated     Mobility  Bed Mobility Overal bed mobility: Needs Assistance Bed Mobility: Supine to Sit, Sit to Supine     Supine to sit: Contact guard Sit to supine: Contact guard assist   General bed mobility comments: utilized gait belt for self assist of Lt LE    Transfers Overall transfer level: Needs assistance Equipment used: Rolling walker (2 wheels) Transfers: Sit to/from Stand, Bed to chair/wheelchair/BSC Sit to Stand: Contact guard assist Stand pivot transfers: Contact guard assist         General transfer comment: reliant on UE support so encouraged trying RW with transfer to/from W/C, cues for positioning and technique; pt also transferred to/from [facility] over toilet (without RW utilizing armrests of w/c and BSC); pain still present however more tolerable this afternoon per  pt    Ambulation/Gait                   Stairs             Wheelchair Mobility     Tilt Bed    Modified Rankin (Stroke Patients Only)       Balance                                            Communication Communication Communication: No apparent difficulties  Cognition Arousal: Alert Behavior During Therapy: WFL for tasks assessed/performed   PT - Cognitive impairments: No apparent impairments                         Following commands: Intact      Cueing    Exercises Total Joint Exercises Ankle Circles/Pumps: AROM, Both, 10 reps Quad Sets: AROM, Both, 10 reps Heel Slides: AAROM, Left, 10 reps Hip ABduction/ADduction: AAROM, Left, 10 reps Long Arc Quad: AROM, Seated, Left, 10 reps    General Comments        Pertinent Vitals/Pain Pain Assessment Pain Assessment: 0-10 Pain Score: 7  Pain Location: left hip Pain Descriptors / Indicators: Sore, Aching, Tender, Guarding Pain Intervention(s): Repositioned, Monitored during session, Premedicated before session    Home Living  Prior Function            PT Goals (current goals can now be found in the care plan section) Progress towards PT goals: Progressing toward goals    Frequency    7X/week      PT Plan      Co-evaluation              AM-PAC PT "6 Clicks" Mobility   Outcome Measure  Help needed turning from your back to your side while in a flat bed without using bedrails?: A Little Help needed moving from lying on your back to sitting on the side of a flat bed without using bedrails?: A Little Help needed moving to and from a bed to a chair (including a wheelchair)?: A Little Help needed standing up from a chair using your arms (e.g., wheelchair or bedside chair)?: A Little Help needed to walk in hospital room?: A Lot Help needed climbing 3-5 steps with a railing? : A Lot 6 Click Score: 16    End of  Session Equipment Utilized During Treatment: Gait belt Activity Tolerance: Patient tolerated treatment well Patient left: in bed;with bed alarm set;with call bell/phone within reach Nurse Communication: Mobility status PT Visit Diagnosis: Other abnormalities of gait and mobility (R26.89);Pain Pain - Right/Left: Left Pain - part of body: Hip     Time: 4696-2952 PT Time Calculation (min) (ACUTE ONLY): 20 min  Charges:    $Therapeutic Activity: 8-22 mins PT General Charges $$ ACUTE PT VISIT: 1 Visit                     Blanch Bunde, DPT Physical Therapist Acute Rehabilitation Services Office: [phone]    Nicole [name] 06/24/2023, 2:58 PM

## 2023-06-24 NOTE — Plan of Care (Signed)

## 2023-06-24 NOTE — Plan of Care (Signed)

## 2023-06-24 NOTE — Progress Notes (Signed)
   06/24/23 1044  TOC Brief Assessment  Insurance and Status Reviewed  Patient has primary care physician Yes  Home environment has been reviewed Home  Prior level of function: Independent  Prior/Current Home Services No current home services  Social Drivers of Health Review SDOH reviewed no interventions necessary  Readmission risk has been reviewed Yes  Transition of care needs no transition of care needs at this time

## 2023-06-24 NOTE — Discharge Instructions (Signed)
 Per [facility] clinic policy, our goal is ensure optimal postoperative pain control with a multimodal pain management strategy. For all [facility] patients, our goal is to wean post-operative narcotic medications by 6 weeks post-operatively. If this is not possible due to utilization of pain medication prior to surgery, your [facility] doctor will support your acute post-operative pain control for the first 6 weeks postoperatively, with a plan to transition you back to your primary pain team following that. Nicole [name] will work to ensure a Therapist, occupational.  INSTRUCTIONS AFTER JOINT REPLACEMENT   Remove items at home which could result in a fall. This includes throw rugs or furniture in walking pathways ICE to the affected joint every three hours while awake for 30 minutes at a time, for at least the first 3-5 days, and then as needed for pain and swelling.  Continue to use ice for pain and swelling. You may notice swelling that will progress down to the foot and ankle.  This is normal after surgery.  Elevate your leg when you are not up walking on it.   Continue to use the breathing machine you got in the hospital (incentive spirometer) which will help keep your temperature down.  It is common for your temperature to cycle up and down following surgery, especially at night when you are not up moving around and exerting yourself.  The breathing machine keeps your lungs expanded and your temperature down.   DIET:  As you were doing prior to hospitalization, we recommend a well-balanced diet.  DRESSING / WOUND CARE / SHOWERING  Keep the surgical dressing until follow up.  The dressing is water proof, so you can shower without any extra covering.  IF THE DRESSING FALLS OFF or the wound gets wet inside, change the dressing with sterile gauze.  Please use good hand washing techniques before changing the dressing.  Do not use any lotions or creams on the incision until instructed by your surgeon.     ACTIVITY  Increase activity slowly as tolerated, but follow the weight bearing instructions below.   No driving for 6 weeks or until further direction given by your physician.  You cannot drive while taking narcotics.  No lifting or carrying greater than 10 lbs. until further directed by your surgeon. Avoid periods of inactivity such as sitting longer than an hour when not asleep. This helps prevent blood clots.  You may return to work once you are authorized by your doctor.     WEIGHT BEARING   Weight bearing as tolerated with assist device (walker, cane, etc) as directed, use it as long as suggested by your surgeon or therapist, typically at least 4-6 weeks.   EXERCISES  Results after joint replacement surgery are often greatly improved when you follow the exercise, range of motion and muscle strengthening exercises prescribed by your doctor. Safety measures are also important to protect the joint from further injury. Any time any of these exercises cause you to have increased pain or swelling, decrease what you are doing until you are comfortable again and then slowly increase them. If you have problems or questions, call your caregiver or physical therapist for advice.   Rehabilitation is important following a joint replacement. After just a few days of immobilization, the muscles of the leg can become weakened and shrink (atrophy).  These exercises are designed to build up the tone and strength of the thigh and leg muscles and to improve motion. Often times heat used for twenty to thirty minutes before  working out will loosen up your tissues and help with improving the range of motion but do not use heat for the first two weeks following surgery (sometimes heat can increase post-operative swelling).   These exercises can be done on a training (exercise) mat, on the floor, on a table or on a bed. Use whatever works the best and is most comfortable for you.    Use music or television  while you are exercising so that the exercises are a pleasant break in your day. This will make your life better with the exercises acting as a break in your routine that you can look forward to.   Perform all exercises about fifteen times, three times per day or as directed.  You should exercise both the operative leg and the other leg as well.  Exercises include:   Quad Sets - Tighten up the muscle on the front of the thigh (Quad) and hold for 5-10 seconds.   Straight Leg Raises - With your knee straight (if you were given a brace, keep it on), lift the leg to 60 degrees, hold for 3 seconds, and slowly lower the leg.  Perform this exercise against resistance later as your leg gets stronger.  Leg Slides: Lying on your back, slowly slide your foot toward your buttocks, bending your knee up off the floor (only go as far as is comfortable). Then slowly slide your foot back down until your leg is flat on the floor again.  Angel Wings: Lying on your back spread your legs to the side as far apart as you can without causing discomfort.  Hamstring Strength:  Lying on your back, push your heel against the floor with your leg straight by tightening up the muscles of your buttocks.  Repeat, but this time bend your knee to a comfortable angle, and push your heel against the floor.  You may put a pillow under the heel to make it more comfortable if necessary.   A rehabilitation program following joint replacement surgery can speed recovery and prevent re-injury in the future due to weakened muscles. Contact your doctor or a physical therapist for more information on knee rehabilitation.    CONSTIPATION  Constipation is defined medically as fewer than three stools per week and severe constipation as less than one stool per week.  Even if you have a regular bowel pattern at home, your normal regimen is likely to be disrupted due to multiple reasons following surgery.  Combination of anesthesia, postoperative  narcotics, change in appetite and fluid intake all can affect your bowels.   YOU MUST use at least one of the following options; they are listed in order of increasing strength to get the job done.  They are all available over the counter, and you may need to use some, POSSIBLY even all of these options:    Drink plenty of fluids (prune juice may be helpful) and high fiber foods Colace 100 mg by mouth twice a day  Senokot for constipation as directed and as needed Dulcolax (bisacodyl), take with full glass of water  Miralax (polyethylene glycol) once or twice a day as needed.  If you have tried all these things and are unable to have a bowel movement in the first 3-4 days after surgery call either your surgeon or your primary doctor.    If you experience loose stools or diarrhea, hold the medications until you stool forms back up.  If your symptoms do not get better within 1 week  or if they get worse, check with your doctor.  If you experience "the worst abdominal pain ever" or develop nausea or vomiting, please contact the office immediately for further recommendations for treatment.   ITCHING:  If you experience itching with your medications, try taking only a single pain pill, or even half a pain pill at a time.  You can also use Benadryl over the counter for itching or also to help with sleep.   TED HOSE STOCKINGS:  Use stockings on both legs until for at least 2 weeks or as directed by physician office. They may be removed at night for sleeping.  MEDICATIONS:  See your medication summary on the "After Visit Summary" that nursing will review with you.  You may have some home medications which will be placed on hold until you complete the course of blood thinner medication.  It is important for you to complete the blood thinner medication as prescribed.  PRECAUTIONS:  If you experience chest pain or shortness of breath - call 911 immediately for transfer to the hospital emergency department.    If you develop a fever greater that 101 F, purulent drainage from wound, increased redness or drainage from wound, foul odor from the wound/dressing, or calf pain - CONTACT YOUR SURGEON.                                                   FOLLOW-UP APPOINTMENTS:  If you do not already have a post-op appointment, please call the office for an appointment to be seen by your surgeon.  Guidelines for how soon to be seen are listed in your "After Visit Summary", but are typically between 1-4 weeks after surgery.  OTHER INSTRUCTIONS:   Knee Replacement:  Do not place pillow under knee, focus on keeping the knee straight while resting. CPM instructions: 0-90 degrees, 2 hours in the morning, 2 hours in the afternoon, and 2 hours in the evening. Place foam block, curve side up under heel at all times except when in CPM or when walking.  DO NOT modify, tear, cut, or change the foam block in any way.  POST-OPERATIVE OPIOID TAPER INSTRUCTIONS: It is important to wean off of your opioid medication as soon as possible. If you do not need pain medication after your surgery it is ok to stop day one. Opioids include: Codeine, Hydrocodone(Norco, Vicodin), Oxycodone(Percocet, oxycontin) and hydromorphone amongst others.  Long term and even short term use of opiods can cause: Increased pain response Dependence Constipation Depression Respiratory depression And more.  Withdrawal symptoms can include Flu like symptoms Nausea, vomiting And more Techniques to manage these symptoms Hydrate well Eat regular healthy meals Stay active Use relaxation techniques(deep breathing, meditating, yoga) Do Not substitute Alcohol to help with tapering If you have been on opioids for less than two weeks and do not have pain than it is ok to stop all together.  Plan to wean off of opioids This plan should start within one week post op of your joint replacement. Maintain the same interval or time between taking each dose  and first decrease the dose.  Cut the total daily intake of opioids by one tablet each day Next start to increase the time between doses. The last dose that should be eliminated is the evening dose.   MAKE SURE YOU:  Understand these instructions.  Get help right away if you are not doing well or get worse.    Thank you for letting us be a part of your medical care team.  It is a privilege we respect greatly.  We hope these instructions will help you stay on track for a fast and full recovery!      Dental Antibiotics:  In most cases prophylactic antibiotics for Dental procdeures after total joint surgery are not necessary.  Exceptions are as follows:  1. History of prior total joint infection  2. Severely immunocompromised (Organ Transplant, cancer chemotherapy, Rheumatoid biologic meds such as Humera)  3. Poorly controlled diabetes (A1C &gt; 8.0, blood glucose over 200)  If you have one of these conditions, contact your surgeon for an antibiotic prescription, prior to your dental procedure.

## 2023-06-24 NOTE — Progress Notes (Signed)
 Physical Therapy Treatment Patient Details Name: Nicole [name] MRN: [id number] DOB: [birth year] Today's Date: 06/24/2023   History of Present Illness 73 y.o. female s/p Lt THA on 06/23/23.  PMHx: seizures, schizophrenia, HTN, GERD, Afib, CHF, liver cirrhosis, chronic alcoholism, bil ankle fracture surgeries    PT Comments  Pt requesting to use bathroom however felt unable to use w/c and requested BSC.  Pt assisted to/from [facility] and reports increased, intense hip pain.  Pt requesting pain meds so RN notified.    If plan is discharge home, recommend the following:     Can travel by private vehicle        Equipment Recommendations  None recommended by PT    Recommendations for Other Services       Precautions / Restrictions Precautions Precautions: Fall Restrictions LLE Weight Bearing Per Provider Order: Weight bearing as tolerated     Mobility  Bed Mobility Overal bed mobility: Needs Assistance Bed Mobility: Supine to Sit, Sit to Supine     Supine to sit: Min assist Sit to supine: Min assist   General bed mobility comments: assist for Lt LE due to pain    Transfers Overall transfer level: Needs assistance Equipment used: None Transfers: Bed to chair/wheelchair/BSC, Sit to/from Stand Sit to Stand: Min assist Stand pivot transfers: Min assist         General transfer comment: reliant on UE support of armrests or bed rail; transferred to/from [facility]; pt in too much pain to progress further    Ambulation/Gait                   Stairs             Wheelchair Mobility     Tilt Bed    Modified Rankin (Stroke Patients Only)       Balance                                            Communication Communication Communication: No apparent difficulties  Cognition Arousal: Alert Behavior During Therapy: WFL for tasks assessed/performed   PT - Cognitive impairments: No apparent impairments                          Following commands: Intact      Cueing    Exercises      General Comments        Pertinent Vitals/Pain Pain Assessment Pain Assessment: 0-10 Pain Score: 8  Pain Location: left hip Pain Descriptors / Indicators: Sore, Aching, Tender, Guarding, Grimacing, Moaning Pain Intervention(s): Repositioned, Monitored during session, Patient requesting pain meds-RN notified    Home Living                          Prior Function            PT Goals (current goals can now be found in the care plan section) Progress towards PT goals: Progressing toward goals    Frequency    7X/week      PT Plan      Co-evaluation              AM-PAC PT "6 Clicks" Mobility   Outcome Measure  Help needed turning from your back to your side while in a flat bed without using bedrails?: A Little Help needed  moving from lying on your back to sitting on the side of a flat bed without using bedrails?: A Little Help needed moving to and from a bed to a chair (including a wheelchair)?: A Little Help needed standing up from a chair using your arms (e.g., wheelchair or bedside chair)?: A Lot Help needed to walk in hospital room?: A Lot Help needed climbing 3-5 steps with a railing? : A Lot 6 Click Score: 15    End of Session Equipment Utilized During Treatment: Gait belt Activity Tolerance: Patient limited by pain Patient left: in bed;with call bell/phone within reach;with nursing/sitter in room (NT in to get vitals) Nurse Communication: Mobility status;Patient requests pain meds PT Visit Diagnosis: Other abnormalities of gait and mobility (R26.89);Pain Pain - Right/Left: Left Pain - part of body: Hip     Time: 6578-4696 PT Time Calculation (min) (ACUTE ONLY): 10 min  Charges:    $Therapeutic Activity: 8-22 mins PT General Charges $$ ACUTE PT VISIT: 1 Visit                    Blanch Bunde, DPT Physical Therapist Acute Rehabilitation Services Office:  [phone]    [name] 06/24/2023, 2:53 PM

## 2023-06-25 LAB — GLUCOSE, CAPILLARY
Glucose-Capillary: 125 mg/dL — ABNORMAL HIGH (ref 70–99)
Glucose-Capillary: 124 mg/dL — ABNORMAL HIGH (ref 70–99)
Glucose-Capillary: 186 mg/dL — ABNORMAL HIGH (ref 70–99)
Glucose-Capillary: 153 mg/dL — ABNORMAL HIGH (ref 70–99)

## 2023-06-25 MED ORDER — KETOROLAC TROMETHAMINE 15 MG/ML IJ SOLN
7.5000 mg | Freq: Four times a day (QID) | INTRAMUSCULAR | Status: AC
Start: 2023-06-25 — End: 2023-06-27
  Administered 2023-06-25 – 2023-06-26 (×6): 7.5 mg via INTRAVENOUS
  Filled 2023-06-25 (×6): qty 1

## 2023-06-25 MED ORDER — KETOROLAC TROMETHAMINE 15 MG/ML IJ SOLN: 7.5000 mg | Freq: Four times a day (QID) | INTRAMUSCULAR | Status: DC

## 2023-06-25 NOTE — Plan of Care (Signed)

## 2023-06-25 NOTE — Progress Notes (Signed)
 Physical Therapy Treatment Patient Details Name: Nicole [name] MRN: [id number] DOB: 05/07/1950 Today's Date: 06/25/2023   History of Present Illness 73 y.o. female s/p Lt THA on 06/23/23.  PMHx: seizures, schizophrenia, HTN, GERD, Afib, CHF, liver cirrhosis, chronic alcoholism, bil ankle fracture surgeries    PT Comments  Pt assisted to/from bathroom with her w/c.  Pt requiring min assist for transfers and managing briefs. Pt with minimal blood on bed pad, continence pad and briefs observed while assisting pt use toilet so RN notified.  Pt agreeable to remain OOB in recliner and encouraged to perform ankle pumps and use incentive spirometer.      If plan is discharge home, recommend the following: A little help with walking and/or transfers;A little help with bathing/dressing/bathroom;Help with stairs or ramp for entrance   Can travel by private vehicle        Equipment Recommendations  None recommended by PT    Recommendations for Other Services       Precautions / Restrictions Precautions Precautions: Fall Restrictions LLE Weight Bearing Per Provider Order: Weight bearing as tolerated     Mobility  Bed Mobility Overal bed mobility: Needs Assistance Bed Mobility: Supine to Sit     Supine to sit: Contact guard     General bed mobility comments: utilized gait belt for self assist of Lt LE    Transfers Overall transfer level: Needs assistance Equipment used: None Transfers: Sit to/from Stand, Bed to chair/wheelchair/BSC Sit to Stand: Min assist Stand pivot transfers: Min assist         General transfer comment: reliant on UE support but prefers use of armrests instead of RW so did not use RW, cues for positioning and technique; pt transferred to w/c then to/from [facility] over toilet (without RW utilizing armrests of w/c and BSC) and then w/c to recliner; assist to rise and stabilize; pt requiring assist for brief management in standing, unsteady and requiring assist if  only one UE support    Ambulation/Gait                   Set designer Details (indicate cue type and reason): pt has been able to propel her w/c in room with UEs, no cues required   Tilt Bed    Modified Rankin (Stroke Patients Only)       Balance                                            Communication Communication Communication: No apparent difficulties  Cognition Arousal: Alert Behavior During Therapy: WFL for tasks assessed/performed   PT - Cognitive impairments: No apparent impairments                         Following commands: Intact      Cueing    Exercises      General Comments        Pertinent Vitals/Pain Pain Assessment Pain Assessment: 0-10 Pain Score: 7  Pain Location: left hip Pain Descriptors / Indicators: Sore, Aching, Tender, Guarding Pain Intervention(s): Repositioned, Monitored during session    Home Living                          Prior  Function            PT Goals (current goals can now be found in the care plan section) Progress towards PT goals: Progressing toward goals    Frequency    7X/week      PT Plan      Co-evaluation              AM-PAC PT "6 Clicks" Mobility   Outcome Measure  Help needed turning from your back to your side while in a flat bed without using bedrails?: A Little Help needed moving from lying on your back to sitting on the side of a flat bed without using bedrails?: A Little Help needed moving to and from a bed to a chair (including a wheelchair)?: A Little Help needed standing up from a chair using your arms (e.g., wheelchair or bedside chair)?: A Little Help needed to walk in hospital room?: A Lot Help needed climbing 3-5 steps with a railing? : A Lot 6 Click Score: 16    End of Session Equipment Utilized During Treatment: Gait belt Activity Tolerance: Patient  tolerated treatment well Patient left: in chair;with chair alarm set;with call bell/phone within reach   PT Visit Diagnosis: Pain;Other abnormalities of gait and mobility (R26.89);Muscle weakness (generalized) (M62.81);Unsteadiness on feet (R26.81) Pain - Right/Left: Left Pain - part of body: Hip     Time: 9562-1308 PT Time Calculation (min) (ACUTE ONLY): 12 min  Charges:    $Therapeutic Activity: 8-22 mins PT General Charges $$ ACUTE PT VISIT: 1 Visit                     Blanch Bunde, DPT Physical Therapist Acute Rehabilitation Services Office: [phone]  [name] 06/25/2023, 4:42 PM

## 2023-06-25 NOTE — Progress Notes (Signed)
 Physical therapist stated that she helped pt to the toilet and there was blood noted on the bed pad, in the pt's underwear, and in the urine as well. MD made aware, informed NT so that we can continue to monitor.

## 2023-06-25 NOTE — Progress Notes (Addendum)
 Patient ID: Nicole [name], female   DOB: July 29, 1950, 73 y.o.   MRN: [id number] Patient is status post left total hip arthroplasty.  Patient states she had some blood in her urine this morning most likely secondary to the her catheter.  Patient will require additional therapy for safe discharge.  Discussed the importance of ankle crease fluid intake.  Patient's potassium has been stable and his most recent elevated potassium is most likely secondary to hemolysis.  Patient has normal renal function.

## 2023-06-25 NOTE — Progress Notes (Signed)
 Physical Therapy Treatment Patient Details Name: Nicole [name] MRN: [id number] DOB: 1951-01-29 Today's Date: 06/25/2023   History of Present Illness 73 y.o. female s/p Lt THA on 06/23/23.  PMHx: seizures, schizophrenia, HTN, GERD, Afib, CHF, liver cirrhosis, chronic alcoholism, bil ankle fracture surgeries    PT Comments  Pt requesting to use bathroom again.  Pt assisted to/from w/c to use toilet/BSC and then back to bed.  Pt currently requires min assist with transfers for stability.  Pt with more pain this afternoon, reporting she has only been able to take tylenol .  RN informed of pain med request.  Spouse present in room this afternoon in his electric scooter.  Pt progressing slowly, and pt wondering if SNF rehab would be more appropriate upon d/c.  She is concerned about her pain and requiring assist with spouse being unable to help.  Pt could benefit from SNF if MD feels appropriate.     If plan is discharge home, recommend the following: A little help with walking and/or transfers;A little help with bathing/dressing/bathroom;Help with stairs or ramp for entrance   Can travel by private vehicle        Equipment Recommendations  None recommended by PT    Recommendations for Other Services       Precautions / Restrictions Precautions Precautions: Fall Restrictions LLE Weight Bearing Per Provider Order: Weight bearing as tolerated     Mobility  Bed Mobility Overal bed mobility: Needs Assistance Bed Mobility: Supine to Sit, Sit to Supine     Supine to sit: Contact guard, HOB elevated Sit to supine: Contact guard assist, HOB elevated   General bed mobility comments: utilized gait belt for self assist of Lt LE    Transfers Overall transfer level: Needs assistance Equipment used: None Transfers: Sit to/from Stand, Bed to chair/wheelchair/BSC Sit to Stand: Min assist Stand pivot transfers: Min assist         General transfer comment: reliant on UE support but prefers  use of armrests instead of RW so did not use RW, cues for positioning and technique; pt transferred to w/c then to/from [facility] over toilet (without RW utilizing armrests of w/c and BSC) and then w/c back to bed per request; assist to rise and stabilize; pt requiring assist for brief management in standing, unsteady and requiring assist if only one UE support    Ambulation/Gait                   Set designer Details (indicate cue type and reason): pt has been able to propel her w/c in room with UEs, no cues required   Tilt Bed    Modified Rankin (Stroke Patients Only)       Balance                                            Communication Communication Communication: No apparent difficulties  Cognition Arousal: Alert Behavior During Therapy: WFL for tasks assessed/performed   PT - Cognitive impairments: No apparent impairments                         Following commands: Intact      Cueing    Exercises      General Comments  Pertinent Vitals/Pain Pain Assessment Pain Assessment: 0-10 Pain Score: 10-Worst pain ever Pain Location: left hip Pain Descriptors / Indicators: Sore, Aching, Tender, Guarding Pain Intervention(s): Monitored during session, Repositioned, Patient requesting pain meds-RN notified    Home Living                          Prior Function            PT Goals (current goals can now be found in the care plan section) Progress towards PT goals: Progressing toward goals    Frequency    7X/week      PT Plan      Co-evaluation              AM-PAC PT "6 Clicks" Mobility   Outcome Measure  Help needed turning from your back to your side while in a flat bed without using bedrails?: A Little Help needed moving from lying on your back to sitting on the side of a flat bed without using bedrails?: A Little Help  needed moving to and from a bed to a chair (including a wheelchair)?: A Little Help needed standing up from a chair using your arms (e.g., wheelchair or bedside chair)?: A Little Help needed to walk in hospital room?: A Lot Help needed climbing 3-5 steps with a railing? : A Lot 6 Click Score: 16    End of Session Equipment Utilized During Treatment: Gait belt Activity Tolerance: Patient limited by pain Patient left: in bed;with call bell/phone within reach;with bed alarm set;with family/visitor present Nurse Communication: Mobility status;Patient requests pain meds PT Visit Diagnosis: Pain;Other abnormalities of gait and mobility (R26.89);Muscle weakness (generalized) (M62.81);Unsteadiness on feet (R26.81) Pain - Right/Left: Left Pain - part of body: Hip     Time: 1610-9604 PT Time Calculation (min) (ACUTE ONLY): 15 min  Charges:    $Therapeutic Activity: 8-22 mins PT General Charges $$ ACUTE PT VISIT: 1 Visit                    Blanch Bunde, DPT Physical Therapist Acute Rehabilitation Services Office: [phone]    [name] 06/25/2023, 4:47 PM

## 2023-06-26 ENCOUNTER — Encounter (HOSPITAL_COMMUNITY): Payer: Self-pay | Admitting: Orthopaedic Surgery

## 2023-06-26 LAB — GLUCOSE, CAPILLARY
Glucose-Capillary: 154 mg/dL — ABNORMAL HIGH (ref 70–99)
Glucose-Capillary: 177 mg/dL — ABNORMAL HIGH (ref 70–99)
Glucose-Capillary: 100 mg/dL — ABNORMAL HIGH (ref 70–99)
Glucose-Capillary: 142 mg/dL — ABNORMAL HIGH (ref 70–99)

## 2023-06-26 NOTE — Progress Notes (Signed)
 Patient ID: Nicole [name], female   DOB: 01-30-1951, 73 y.o.   MRN: [id number] The patient states today that she would rather try to go home by tomorrow rather than short-term skilled nursing placement.  Hopefully therapy can make more progress with the patient today so we can see about getting her home by tomorrow with home health PT.  Her vital signs are stable and her left operative hip is stable.

## 2023-06-26 NOTE — Progress Notes (Signed)
 Physical Therapy Treatment Patient Details Name: Nicole [name] MRN: [id number] DOB: 09-27-1950 Today's Date: 06/26/2023   History of Present Illness 73 y.o. female s/p Lt THA on 06/23/23.  PMHx: seizures, schizophrenia, HTN, GERD, Afib, CHF, liver cirrhosis, chronic alcoholism, bil ankle fracture surgeries    PT Comments  POD # 3 am session PT - Cognition Comments: Pt is AxO x 3 pleasant and motivated.  She wishes to return home vs SNF rec.  She needs to perform at Supervision level for all mobility/transfers as Spouse is Physically unable to asisst. Assisted OOB to amb went well.  Pain better controlled.  General bed mobility comments: Pt was able to transition self from supine to EOB with use of belt to guide L LE.  General transfer comment: Pt self able to perform sit to stand from bed to walker to amb as well as transfers from her personal wheelchair to recliner with good safety cognition/awareness and use of hands to steady self.  Much improved. General Gait Details: Pt was able to amb 18 feet with her walker at ContactGuard/Supervision level with reports 4/10 hip pain. Then returned to room to perform some TE's following HEP handout.  Instructed on proper tech, freq as well as use of ICE.   Per RN, Pt plans to D/C to home tomorrow.     If plan is discharge home, recommend the following: A little help with walking and/or transfers;A little help with bathing/dressing/bathroom;Help with stairs or ramp for entrance   Can travel by private vehicle        Equipment Recommendations  None recommended by PT    Recommendations for Other Services       Precautions / Restrictions Precautions Precautions: Fall Restrictions Weight Bearing Restrictions Per Provider Order: No LLE Weight Bearing Per Provider Order: Weight bearing as tolerated     Mobility  Bed Mobility Overal bed mobility: Needs Assistance Bed Mobility: Supine to Sit     Supine to sit: Supervision     General bed  mobility comments: Pt was able to transition self from supine to EOB with use of belt to guide L LE    Transfers Overall transfer level: Needs assistance Equipment used: None, Rolling walker (2 wheels) Transfers: Sit to/from Stand, Bed to chair/wheelchair/BSC Sit to Stand: Supervision Stand pivot transfers: Supervision         General transfer comment: Pt self able to perform sit to stand from bed to walker to amb as well as transfers from her personal wheelchair to recliner with good safety cognition/awareness and use of hands to steady self.  Much improved.    Ambulation/Gait Ambulation/Gait assistance: Supervision, Contact guard assist Gait Distance (Feet): 18 Feet Assistive device: Rolling walker (2 wheels) Gait Pattern/deviations: Step-to pattern, Decreased stance time - left Gait velocity: decreased     General Gait Details: Pt was able to amb 18 feet with her walker at ContactGuard/Supervision level with reports 4/10 hip pain.   Stairs             Wheelchair Mobility     Tilt Bed    Modified Rankin (Stroke Patients Only)       Balance                                            Communication Communication Communication: No apparent difficulties  Cognition Arousal: Alert Behavior During Therapy: [facility] for  tasks assessed/performed   PT - Cognitive impairments: No apparent impairments                       PT - Cognition Comments: Pt is AxO x 3 pleasant and motivated.  She wishes to return home vs SNF rec.  She needs to perform at Supervision level for all mobility/transfers as Spouse is Physically unable to asisst. Following commands: Intact      Cueing Cueing Techniques: Verbal cues  Exercises  Total Hip Replacement TE's following HEP Handout 10 reps ankle pumps 05 reps knee presses 05 reps heel slides 05 reps SAQ's 05 reps AB05 reps LAQ's 05 reps all standing TE's Instructed how to use a belt loop to assist   Followed by ICE     General Comments        Pertinent Vitals/Pain Pain Assessment Pain Assessment: 0-10 Pain Score: 3  Pain Location: left hip Pain Descriptors / Indicators: Sore, Tender Pain Intervention(s): Monitored during session, Premedicated before session, Repositioned, Ice applied    Home Living                          Prior Function            PT Goals (current goals can now be found in the care plan section) Progress towards PT goals: Progressing toward goals    Frequency    7X/week      PT Plan      Co-evaluation              AM-PAC PT "6 Clicks" Mobility   Outcome Measure  Help needed turning from your back to your side while in a flat bed without using bedrails?: None Help needed moving from lying on your back to sitting on the side of a flat bed without using bedrails?: None Help needed moving to and from a bed to a chair (including a wheelchair)?: None Help needed standing up from a chair using your arms (e.g., wheelchair or bedside chair)?: None Help needed to walk in hospital room?: A Little Help needed climbing 3-5 steps with a railing? : A Little 6 Click Score: 22    End of Session Equipment Utilized During Treatment: Gait belt Activity Tolerance: Patient tolerated treatment well Patient left: in chair Nurse Communication: Mobility status PT Visit Diagnosis: Pain;Other abnormalities of gait and mobility (R26.89);Muscle weakness (generalized) (M62.81);Unsteadiness on feet (R26.81) Pain - Right/Left: Left Pain - part of body: Hip     Time: 4098-1191 PT Time Calculation (min) (ACUTE ONLY): 26 min  Charges:    $Gait Training: 8-22 mins $Therapeutic Exercise: 8-22 mins PT General Charges $$ ACUTE PT VISIT: 1 Visit                    Bess Broody  PTA Acute  Rehabilitation Services Office M-F          [phone]

## 2023-06-26 NOTE — TOC Progression Note (Signed)
 Transition of Care [facility]) - Progression Note    Patient Details  Name: Nicole [name] MRN: [id number] Date of Birth: 14-Oct-1950  Transition of [facility] CM/SW Contact  Bari Leys, RN Phone Number: 06/26/2023, 1:55 PM  Clinical Narrative:   Met with patient at bedside to review dc therapy and home equipment needs, pt reports she is to receive [facility] PT but was not informed of the [facility] agency, also reports she needs a RW. TOC SW, Lucy, confirmed with Ortho office that [facility] PT has been confirmed with [facility], Will obtain RW from [employer] in AM.       Barriers to Discharge: Continued Medical Work up  Expected Discharge Plan and Services                                               Social Determinants of Health (SDOH) Interventions SDOH Screenings   Food Insecurity: No Food Insecurity (06/23/2023)  Housing: Low Risk  (06/23/2023)  Transportation Needs: No Transportation Needs (06/23/2023)  Utilities: Not At Risk (06/23/2023)  Alcohol  Screen: Low Risk  (08/09/2022)  Depression (PHQ2-9): Low Risk  (08/09/2022)  Recent Concern: Depression (PHQ2-9) - High Risk (06/08/2022)  Financial Resource Strain: Low Risk  (02/18/2023)  Physical Activity: Insufficiently Active (02/18/2023)  Social Connections: Socially Isolated (06/23/2023)  Stress: Stress Concern Present (02/18/2023)  Tobacco Use: Medium Risk (06/23/2023)    Readmission Risk Interventions     No data to display

## 2023-06-26 NOTE — Plan of Care (Signed)
  Problem: Nutrition: Goal: Adequate nutrition will be maintained Outcome: Progressing   Problem: Activity: Goal: Risk for activity intolerance will decrease Outcome: Progressing   Problem: Pain Managment: Goal: General experience of comfort will improve and/or be controlled Outcome: Progressing   Problem: Safety: Goal: Ability to remain free from injury will improve Outcome: Progressing

## 2023-06-26 NOTE — Progress Notes (Signed)
 Physical Therapy Treatment Patient Details Name: Nicole [name] MRN: [id number] DOB: [birth year] Today's Date: 06/26/2023   History of Present Illness 73 y.o. female s/p Lt THA on 06/23/23.  PMHx: seizures, schizophrenia, HTN, GERD, Afib, CHF, liver cirrhosis, chronic alcoholism, bil ankle fracture surgeries    PT Comments  POD # 3 pm session PT - Cognition Comments: Pt is AxO x 3 pleasant and motivated.  She wishes to return home vs SNF rec.  She needs to perform at Supervision level for all mobility/transfers as Spouse is Physically unable to asisst. General bed mobility comments: Pt self able to transfer both OOB and back to bed using her belt strap to assist LE. General transfer comment: Pt was self able to transfer OOB to her walker as well as a toilet transfer all at Supervision level.  She was also self able to don/doff her under pants and perform self peri care. General Gait Details: Pt was self able to amb from bed to bathroom 11 feet and back as well as safely stand at sink to wash her hands all at Supervision level. Returned to bed to rest.   If plan is discharge home, recommend the following: A little help with walking and/or transfers;A little help with bathing/dressing/bathroom;Help with stairs or ramp for entrance   Can travel by private vehicle        Equipment Recommendations  None recommended by PT    Recommendations for Other Services       Precautions / Restrictions Precautions Precautions: Fall Restrictions Weight Bearing Restrictions Per Provider Order: No LLE Weight Bearing Per Provider Order: Weight bearing as tolerated     Mobility  Bed Mobility Overal bed mobility: Modified Independent Bed Mobility: Supine to Sit     Supine to sit: Supervision     General bed mobility comments: Pt self able to transfer both OOB and back to bed using her belt strap to assist LE.    Transfers Overall transfer level: Needs assistance Equipment used: Rolling walker (2  wheels) Transfers: Sit to/from Stand Sit to Stand: Supervision Stand pivot transfers: Supervision         General transfer comment: Pt was self able to transfer OOB to her walker as well as a toilet transfer all at Supervision level.  She was also self able to don/doff her under pants and perform self peri care.    Ambulation/Gait Ambulation/Gait assistance: Supervision Gait Distance (Feet): 22 Feet (11 feet x 2) Assistive device: Rolling walker (2 wheels) Gait Pattern/deviations: Step-to pattern, Decreased stance time - left Gait velocity: decreased     General Gait Details: Pt was self able to amb from bed to bathroom 11 feet and back as well as safely stand at sink to wash her hands all at Supervision level.   Stairs             Wheelchair Mobility     Tilt Bed    Modified Rankin (Stroke Patients Only)       Balance                                            Communication Communication Communication: No apparent difficulties  Cognition Arousal: Alert Behavior During Therapy: WFL for tasks assessed/performed   PT - Cognitive impairments: No apparent impairments  PT - Cognition Comments: Pt is AxO x 3 pleasant and motivated.  She wishes to return home vs SNF rec.  She needs to perform at Supervision level for all mobility/transfers as Spouse is Physically unable to asisst. Following commands: Intact      Cueing Cueing Techniques: Verbal cues  Exercises      General Comments        Pertinent Vitals/Pain Pain Assessment Pain Assessment: 0-10 Pain Score: 3  Pain Location: left hip Pain Descriptors / Indicators: Sore, Tender Pain Intervention(s): Monitored during session, Premedicated before session, Repositioned, Ice applied    Home Living                          Prior Function            PT Goals (current goals can now be found in the care plan section) Progress towards PT goals:  Progressing toward goals    Frequency    7X/week      PT Plan      Co-evaluation              AM-PAC PT "6 Clicks" Mobility   Outcome Measure  Help needed turning from your back to your side while in a flat bed without using bedrails?: None Help needed moving from lying on your back to sitting on the side of a flat bed without using bedrails?: None Help needed moving to and from a bed to a chair (including a wheelchair)?: None Help needed standing up from a chair using your arms (e.g., wheelchair or bedside chair)?: None Help needed to walk in hospital room?: None Help needed climbing 3-5 steps with a railing? : A Little 6 Click Score: 23    End of Session Equipment Utilized During Treatment: Gait belt Activity Tolerance: Patient tolerated treatment well Patient left: in bed;with call bell/phone within reach;with bed alarm set Nurse Communication: Mobility status PT Visit Diagnosis: Pain;Other abnormalities of gait and mobility (R26.89);Muscle weakness (generalized) (M62.81);Unsteadiness on feet (R26.81) Pain - Right/Left: Left Pain - part of body: Hip     Time: 9629-5284 PT Time Calculation (min) (ACUTE ONLY): 15 min  Charges:    $Gait Training: 8-22 mins  PT General Charges $$ ACUTE PT VISIT: 1 Visit                    Bess Broody  PTA Acute  Rehabilitation Services Office M-F          [phone]

## 2023-06-27 LAB — GLUCOSE, CAPILLARY: Glucose-Capillary: 124 mg/dL — ABNORMAL HIGH (ref 70–99)

## 2023-06-27 NOTE — Progress Notes (Signed)
 Physical Therapy Treatment Patient Details Name: Nicole [name] MRN: [id number] DOB: [birth year] Today's Date: 06/27/2023   History of Present Illness 73 y.o. female s/p Lt THA on 06/23/23.  PMHx: seizures, schizophrenia, HTN, GERD, Afib, CHF, liver cirrhosis, chronic alcoholism, bil ankle fracture surgeries    PT Comments  POD # 4 am session PT - Cognition Comments: Pt is AxO x 3 pleasant and motivated.  She wishes to return home vs SNF rec.  She needs to perform at Supervision level for all mobility/transfers as Spouse is Physically unable to asisst. Pt was self able to get OOB, amb to bathroom.  General transfer comment: Pt self able to transfer from bed to walker as well as on/off toilet including all self hygiene. Pt was self able to get back into bed using her strap.  Then returned to room to perform some TE's following HEP handout.  Instructed on proper tech, freq as well as use of ICE.   Addressed all mobility questions, discussed appropriate activity, educated on use of ICE.  Pt ready for D/C to home.    If plan is discharge home, recommend the following: A little help with walking and/or transfers;A little help with bathing/dressing/bathroom;Help with stairs or ramp for entrance   Can travel by private vehicle        Equipment Recommendations       Recommendations for Other Services       Precautions / Restrictions Precautions Precautions: Fall Restrictions Weight Bearing Restrictions Per Provider Order: No     Mobility  Bed Mobility Overal bed mobility: Modified Independent Bed Mobility: Supine to Sit, Sit to Supine     Supine to sit: Modified independent (Device/Increase time) Sit to supine: Modified independent (Device/Increase time)   General bed mobility comments: Pt self able to transfer both OOB and back to bed using her belt strap to assist LE.    Transfers Overall transfer level: Modified independent Equipment used: None, Rolling walker (2  wheels) Transfers: Sit to/from Stand Sit to Stand: Modified independent (Device/Increase time) Stand pivot transfers: Modified independent (Device/Increase time)         General transfer comment: Pt self able to transfer from bed to walker as well as on/off toilet including all self hygiene.    Ambulation/Gait Ambulation/Gait assistance: Modified independent (Device/Increase time) Gait Distance (Feet): 24 Feet Assistive device: Rolling walker (2 wheels) Gait Pattern/deviations: Step-to pattern, Decreased stance time - left Gait velocity: decreased     General Gait Details: Pt was self able to amb from bed to bathroom 12 feet and back as well as safely stand at sink to wash her hands all at Mod Indep level.   Stairs Stairs:  (ramp)           Wheelchair Mobility     Tilt Bed    Modified Rankin (Stroke Patients Only)       Balance                                            Communication Communication Communication: No apparent difficulties  Cognition Arousal: Alert Behavior During Therapy: WFL for tasks assessed/performed   PT - Cognitive impairments: No apparent impairments                       PT - Cognition Comments: Pt is AxO x 3 pleasant and motivated.  She  wishes to return home vs SNF rec.  She needs to perform at Supervision level for all mobility/transfers as Spouse is Physically unable to asisst. Following commands: Intact      Cueing Cueing Techniques: Verbal cues  Exercises  Total Hip Replacement TE's following HEP Handout 10 reps ankle pumps 05 reps knee presses 05 reps heel slides 05 reps SAQ's 05 reps ABD Instructed how to use a belt loop to assist  Followed by ICE     General Comments        Pertinent Vitals/Pain Pain Assessment Pain Assessment: 0-10 Pain Score: 4  Pain Location: left hip Pain Descriptors / Indicators: Sore, Tender, Operative site guarding Pain Intervention(s): Monitored during  session, Premedicated before session, Repositioned, Ice applied    Home Living                          Prior Function            PT Goals (current goals can now be found in the care plan section) Progress towards PT goals: Progressing toward goals    Frequency    7X/week      PT Plan      Co-evaluation              AM-PAC PT "6 Clicks" Mobility   Outcome Measure  Help needed turning from your back to your side while in a flat bed without using bedrails?: None Help needed moving from lying on your back to sitting on the side of a flat bed without using bedrails?: None Help needed moving to and from a bed to a chair (including a wheelchair)?: None Help needed standing up from a chair using your arms (e.g., wheelchair or bedside chair)?: None Help needed to walk in hospital room?: None Help needed climbing 3-5 steps with a railing? : None 6 Click Score: 24    End of Session Equipment Utilized During Treatment: Gait belt Activity Tolerance: Patient tolerated treatment well Patient left: in bed;with call bell/phone within reach;with bed alarm set Nurse Communication: Mobility status PT Visit Diagnosis: Pain;Other abnormalities of gait and mobility (R26.89);Muscle weakness (generalized) (M62.81);Unsteadiness on feet (R26.81) Pain - Right/Left: Left Pain - part of body: Hip     Time: 1027-2536 PT Time Calculation (min) (ACUTE ONLY): 18 min  Charges:    $Gait Training: 8-22 mins PT General Charges $$ ACUTE PT VISIT: 1 Visit                    Bess Broody  PTA Acute  Rehabilitation Services Office M-F          [phone]

## 2023-06-27 NOTE — Progress Notes (Signed)
 Patient ID: Nicole [name], female   DOB: Mar 09, 1950, 73 y.o.   MRN: [id number] The patient is awake and alert.  Her left operative hip is stable.  She did let me know that her mobility prior to surgery was quite limited and she mainly got around in a wheelchair at home.  She is slowly making progress with therapy.  The plan is to hopefully discharge her to home this afternoon after she has some therapy sessions today.

## 2023-06-27 NOTE — Discharge Summary (Signed)
 Patient ID: Nicole [name] MRN: [id number] DOB/AGE: 22-Jan-1951 73 y.o.  Admit date: 06/23/2023 Discharge date: 06/27/2023  Admission Diagnoses:  Principal Problem:   Avascular necrosis of bone of left hip (HCC) Active Problems:   Status post total replacement of left hip   Discharge Diagnoses:  Same  Past Medical History:  Diagnosis Date   Anemia    Arthritis    Asthma    as a child   Atrial fibrillation with RVR (HCC)    Cancer (HCC)    right kidney   CHF (congestive heart failure) (HCC)    Chronic alcoholism (HCC)    Chronic kidney disease    has solitary kidney ( right nephrectomy for cancer)   Chronic left shoulder pain    Depression    Diabetes mellitus without complication (HCC)    Dysrhythmia    Atrial Fib with RVR    GERD (gastroesophageal reflux disease)    Heartburn    History of blood transfusion    History of hiatal hernia    Hypertension    Liver cirrhosis, alcoholic (HCC)    Memory loss    Schizophrenia (HCC)    patient denies having   Seizures (HCC)    last 2024, no meds and she refuses to see neurology   Thyroid  nodule     Surgeries: Procedure(s): ARTHROPLASTY, HIP, TOTAL, ANTERIOR APPROACH on 06/23/2023   Consultants:   Discharged Condition: Improved  Hospital Course: Nicole [name] is an 73 y.o. female who was admitted 06/23/2023 for operative treatment ofAvascular necrosis of bone of left hip (HCC). Patient has severe unremitting pain that affects sleep, daily activities, and work/hobbies. After pre-op clearance the patient was taken to the operating room on 06/23/2023 and underwent  Procedure(s): ARTHROPLASTY, HIP, TOTAL, ANTERIOR APPROACH.    Patient was given perioperative antibiotics:  Anti-infectives (From admission, onward)    Start     Dose/Rate Route Frequency Ordered Stop   06/23/23 0915  ceFAZolin  (ANCEF ) IVPB 2g/100 mL premix        2 g 200 mL/hr over 30 Minutes Intravenous Every 6 hours 06/23/23 0906 06/23/23 2114   06/23/23  0600  ceFAZolin  (ANCEF ) IVPB 2g/100 mL premix        2 g 200 mL/hr over 30 Minutes Intravenous On call to O.R. 06/23/23 7253 06/23/23 0732        Patient was given sequential compression devices, early ambulation, and chemoprophylaxis to prevent DVT.  Patient benefited maximally from hospital stay and there were no complications.    Recent vital signs: Patient Vitals for the past 24 hrs:  BP Temp Temp src Pulse Resp SpO2  06/27/23 1009 129/76 97.7 F (36.5 C) -- 83 18 93 %  06/27/23 0543 98/65 97.8 F (36.6 C) Oral 79 16 97 %  06/26/23 2214 116/71 -- -- (!) 106 -- --  06/26/23 2140 116/71 98.2 F (36.8 C) Oral (!) 105 17 97 %     Recent laboratory studies: No results for input(s): "WBC", "HGB", "HCT", "PLT", "NA", "K", "CL", "CO2", "BUN", "CREATININE", "GLUCOSE", "INR", "CALCIUM " in the last 72 hours.  Invalid input(s): "PT", "2"   Discharge Medications:   Allergies as of 06/27/2023       Reactions   Other Shortness Of Breath, Anxiety   UNSPECIFIED REACTION to "fast foods and processed food" like hot dogs   Iodinated Contrast Media Swelling   Hand swelling from contrast dye for MRI        Medication List  TAKE these medications    Accu-Chek Guide Test test strip Generic drug: glucose blood USE ONE STRIP IN THE MORNING, AT NOON, AND AT BEDTIME   Accu-Chek Softclix Lancets lancets USE ONE LANCET IN THE MORNING, AT NOON, AND AT BEDTIME   acetaminophen  500 MG tablet Commonly known as: TYLENOL  Take 500 mg by mouth 2 (two) times daily.   b complex vitamins capsule Take 1 capsule by mouth daily.   Blood Glucose Monitoring Suppl Devi 1 each by Does not apply route in the morning, at noon, and at bedtime. May substitute to any manufacturer covered by patient's insurance.   CALCIUM  600 PO Take 600 mg by mouth in the morning and at bedtime.   diclofenac Sodium 1 % Gel Commonly known as: VOLTAREN Apply 2 g topically 4 (four) times daily as needed.   digoxin   0.125 MG tablet Commonly known as: LANOXIN  Take 0.5 tablets (0.0625 mg total) by mouth daily.   Eliquis  5 MG Tabs tablet Generic drug: apixaban  TAKE 1 TABLET(5 MG) BY MOUTH TWICE DAILY   fexofenadine 180 MG tablet Commonly known as: ALLEGRA Take 180 mg by mouth daily as needed for allergies or rhinitis.   Fish Oil 1200 MG Caps Take 1,200 mg by mouth in the morning, at noon, and at bedtime.   icosapent  Ethyl 1 g capsule Commonly known as: Vascepa  Take 2 capsules (2 g total) by mouth 2 (two) times daily.   lidocaine  4 % Place 1 patch onto the skin as needed (pain.).   lisinopril  2.5 MG tablet Commonly known as: ZESTRIL  TAKE 1 TABLET(2.5 MG) BY MOUTH DAILY   loperamide 2 MG capsule Commonly known as: IMODIUM Take 2-4 mg by mouth 4 (four) times daily as needed for diarrhea or loose stools.   MAGNESIUM  PO Take 500 mg by mouth in the morning and at bedtime.   MENOPAUSE SUPPORT PO Take 1 tablet by mouth daily.  Gynovite Plus - Menopause Supplement for Women   metFORMIN  500 MG tablet Commonly known as: GLUCOPHAGE  TAKE 2 TABLETS BY MOUTH WITH BREAKFAST AND 1 TABLET WITH EVENING MEAL   methocarbamol  500 MG tablet Commonly known as: ROBAXIN  Take 1 tablet (500 mg total) by mouth every 6 (six) hours as needed for muscle spasms.   metoprolol  tartrate 50 MG tablet Commonly known as: LOPRESSOR  TAKE 1 TABLET(50 MG) BY MOUTH TWICE DAILY   mirtazapine  15 MG tablet Commonly known as: REMERON  TAKE 1 TABLET(15 MG) BY MOUTH AT BEDTIME   oxyCODONE  5 MG immediate release tablet Commonly known as: Oxy IR/ROXICODONE  Take 1-2 tablets (5-10 mg total) by mouth every 6 (six) hours as needed for moderate pain (pain score 4-6) (pain score 4-6).   POTASSIUM PO Take 595 mg by mouth in the morning and at bedtime.   QC TUMERIC COMPLEX PO Take 1,000 mg by mouth in the morning and at bedtime.   rosuvastatin 5 MG tablet Commonly known as: CRESTOR TAKE 1 TABLET BY MOUTH DAILY   Systane  Complete 0.6 % Soln Generic drug: Propylene Glycol Place 1 drop into both eyes See admin instructions. Instill one drop into both eyes scheduled twice daily, may use as needed for dry eyes   vitamin C 1000 MG tablet Take 1,000 mg by mouth in the morning.   Vitamin D3 125 MCG (5000 UT) Tabs Take 5,000 Units by mouth daily.        Diagnostic Studies: DG Pelvis Portable Result Date: 06/23/2023 CLINICAL DATA:  Status post left hip replacement EXAM: PORTABLE PELVIS  1-2 VIEWS COMPARISON:  04/18/2023 FINDINGS: Left hip prosthesis is noted in satisfactory position. No acute bony abnormality is seen. IMPRESSION: Status post left hip replacement. Electronically Signed   By: Violeta Grey M.D.   On: 06/23/2023 11:43   DG HIP UNILAT WITH PELVIS 1V LEFT Result Date: 06/23/2023 CLINICAL DATA:  Left hip replacement EXAM: DG HIP (WITH OR WITHOUT PELVIS) 1V*L* COMPARISON:  04/18/2023 FLUOROSCOPY TIME:  Radiation Exposure Index (as provided by the fluoroscopic device): 1.83 mGy If the device does not provide the exposure index: Fluoroscopy Time:  23 seconds Number of Acquired Images:  3 FINDINGS: Left hip prosthesis is noted in satisfactory position. No soft tissue abnormality is noted. IMPRESSION: Status post left hip replacement. Electronically Signed   By: Violeta Grey M.D.   On: 06/23/2023 11:42   DG C-Arm 1-60 Min-No Report Result Date: 06/23/2023 Fluoroscopy was utilized by the requesting physician.  No radiographic interpretation.   DG Bone Density Result Date: 06/01/2023 Table formatting from the original result was not included. Date of study: 05/31/2022 Exam: DUAL X-RAY ABSORPTIOMETRY (DXA) FOR BONE MINERAL DENSITY (BMD) Instrument: [employer] Requesting Provider: PCP Indication: follow up for osteoporosis Comparison: none (please note that it is not possible to compare data from different instruments) Clinical data: Pt is a 73 y.o. female with previous history of tibial and arm fracture. On  calcium  and vitamin D . Results:  Lumbar spine L1-L4 (, L2, L3) Femoral neck (FN) 33% distal radius Ultra distal radius T-score +1.1 RFN: -2.4 LFN: -1.9 -2.0 -1.7 Assessment: the BMD is low according to the [facility] classification for osteoporosis (see below). Fracture risk: moderate-high FRAX score: 10 year major osteoporotic risk: 23.2%. 10 year hip fracture risk: 6.9%. The thresholds for treatment are 20% and 3%, respectively. Comments: the technical quality of the study is good,  however, L2 and L3 vertebrae had to be excluded from analysis due to degenerative changes. Ultradistal radius can be used as a surrogate site for BMD analysis of trabecular bone instead of the spine. Evaluation for secondary causes should be considered if clinically indicated. Recommend optimizing calcium  (1200 mg/day) and vitamin D  (800 IU/day) intake. Followup: Repeat BMD is appropriate after 2 years or after 1-2 years if starting treatment. WHO criteria for diagnosis of osteoporosis in postmenopausal women and in men 75 y/o or older: - normal: T-score -1.0 to + 1.0 - osteopenia/low bone density: T-score between -2.5 and -1.0 - osteoporosis: T-score below -2.5 - severe osteoporosis: T-score below -2.5 with history of fragility fracture Note: although not part of the WHO classification, the presence of a fragility fracture, regardless of the T-score, should be considered diagnostic of osteoporosis, provided other causes for the fracture have been excluded. Treatment: The National Osteoporosis Foundation recommends that treatment be considered in postmenopausal women and men age 15 or older with: 1. Hip or vertebral (clinical or morphometric) fracture 2. T-score of - 2.5 or lower at the spine or hip 3. 10-year fracture probability by FRAX of at least 20% for a major osteoporotic fracture and 3% for a hip fracture Emilie Harden, MD [facility]    Disposition: Discharge disposition: 01-Home or Self Care           Follow-up Information     Arnie Lao, MD Follow up in 2 week(s).   Specialty: Orthopedic Surgery Contact information: [address] [place]  [place] [address] [phone]         [facility] [place]  Follow up.   Why: [facility]   Home Health Physical Therapy Contact information: [address] [place] [address] [phone]                  Signed: Arnie Lao 06/27/2023, 1:12 PM

## 2023-06-28 ENCOUNTER — Telehealth: Payer: Self-pay | Admitting: *Deleted

## 2023-06-28 DIAGNOSIS — E1122 Type 2 diabetes mellitus with diabetic chronic kidney disease: Secondary | ICD-10-CM | POA: Diagnosis not present

## 2023-06-28 DIAGNOSIS — Z87891 Personal history of nicotine dependence: Secondary | ICD-10-CM | POA: Diagnosis not present

## 2023-06-28 DIAGNOSIS — Z7901 Long term (current) use of anticoagulants: Secondary | ICD-10-CM | POA: Diagnosis not present

## 2023-06-28 DIAGNOSIS — E43 Unspecified severe protein-calorie malnutrition: Secondary | ICD-10-CM | POA: Diagnosis not present

## 2023-06-28 DIAGNOSIS — Z96642 Presence of left artificial hip joint: Secondary | ICD-10-CM | POA: Diagnosis not present

## 2023-06-28 DIAGNOSIS — M810 Age-related osteoporosis without current pathological fracture: Secondary | ICD-10-CM | POA: Diagnosis not present

## 2023-06-28 DIAGNOSIS — I509 Heart failure, unspecified: Secondary | ICD-10-CM | POA: Diagnosis not present

## 2023-06-28 DIAGNOSIS — Z471 Aftercare following joint replacement surgery: Secondary | ICD-10-CM | POA: Diagnosis not present

## 2023-06-28 DIAGNOSIS — Z85528 Personal history of other malignant neoplasm of kidney: Secondary | ICD-10-CM | POA: Diagnosis not present

## 2023-06-28 DIAGNOSIS — J45909 Unspecified asthma, uncomplicated: Secondary | ICD-10-CM | POA: Diagnosis not present

## 2023-06-28 DIAGNOSIS — I4821 Permanent atrial fibrillation: Secondary | ICD-10-CM | POA: Diagnosis not present

## 2023-06-28 DIAGNOSIS — I130 Hypertensive heart and chronic kidney disease with heart failure and stage 1 through stage 4 chronic kidney disease, or unspecified chronic kidney disease: Secondary | ICD-10-CM | POA: Diagnosis not present

## 2023-06-28 DIAGNOSIS — G894 Chronic pain syndrome: Secondary | ICD-10-CM | POA: Diagnosis not present

## 2023-06-28 DIAGNOSIS — D631 Anemia in chronic kidney disease: Secondary | ICD-10-CM | POA: Diagnosis not present

## 2023-06-28 DIAGNOSIS — M25512 Pain in left shoulder: Secondary | ICD-10-CM | POA: Diagnosis not present

## 2023-06-28 DIAGNOSIS — Z7984 Long term (current) use of oral hypoglycemic drugs: Secondary | ICD-10-CM | POA: Diagnosis not present

## 2023-06-28 DIAGNOSIS — N189 Chronic kidney disease, unspecified: Secondary | ICD-10-CM | POA: Diagnosis not present

## 2023-06-28 DIAGNOSIS — I2510 Atherosclerotic heart disease of native coronary artery without angina pectoris: Secondary | ICD-10-CM | POA: Diagnosis not present

## 2023-06-28 NOTE — Transitions of Care (Post Inpatient/ED Visit) (Signed)
 06/28/2023  Name: Nicole [name] MRN: [id number] DOB: 1950/07/03  Today's TOC FU Call Status: Today's TOC FU Call Status:: Successful TOC FU Call Completed TOC FU Call Complete Date: 06/28/23 Patient's Name and Date of Birth confirmed.  Transition Care Management Follow-up Telephone Call Date of Discharge: 06/27/23 Discharge Facility: [facility]) Type of Discharge: Inpatient Admission Primary Inpatient Discharge Diagnosis:: Avascular necrosis of bone of left hip How have you been since you were released from the hospital?: Better (i am doing fine) Any questions or concerns?: No  Items Reviewed: Did you receive and understand the discharge instructions provided?: Yes Medications obtained,verified, and reconciled?: Yes (Medications Reviewed) Any new allergies since your discharge?: No Dietary orders reviewed?: No Do you have support at home?: Yes People in Home [RPT]: other relative(s) Name of Support/Comfort Primary Source: Aimee Houseman  Medications Reviewed Today: Medications Reviewed Today     Reviewed by Eilene Grater, RN (Case Manager) on 06/28/23 at 1432  Med List Status: <None>   Medication Order Taking? Sig Documenting Provider Last Dose Status Informant  Accu-Chek Softclix Lancets lancets [id number] Yes USE ONE LANCET IN THE MORNING, AT NOON, AND AT BEDTIME Henson, [name], NP-C Taking Active Self  acetaminophen  (TYLENOL ) 500 MG tablet [id number] Yes Take 500 mg by mouth 2 (two) times daily. [provider] Taking Active Self           Med Note (TASTET, HANNAH E   Wed Jun 08, 2022  4:04 PM) Use as needed for pain, usually takes BID  Ascorbic Acid (VITAMIN C) 1000 MG tablet [id number] Yes Take 1,000 mg by mouth in the morning. [provider] Taking Active Self  b complex vitamins capsule [id number] Yes Take 1 capsule by mouth daily. [provider] Taking Active Self  Blood Glucose Monitoring Suppl DEVI [id number] Yes 1 each by Does not  apply route in the morning, at noon, and at bedtime. May substitute to any manufacturer covered by patient's insurance. Abram Abraham, NP-C Taking Active Self  Calcium  Carbonate (CALCIUM  600 PO) [id number] Yes Take 600 mg by mouth in the morning and at bedtime. [provider] Taking Active Self  Cholecalciferol (VITAMIN D3) 125 MCG (5000 UT) TABS [id number] Yes Take 5,000 Units by mouth daily. [provider] Taking Active Self  diclofenac Sodium (VOLTAREN) 1 % GEL [id number] Yes Apply 2 g topically 4 (four) times daily as needed. [provider] Taking Active Self  digoxin  (LANOXIN ) 0.125 MG tablet [id number] Yes Take 0.5 tablets (0.0625 mg total) by mouth daily. Mallipeddi, Vishnu Arlester Ladd, MD Taking Active Self  ELIQUIS  5 MG TABS tablet [id number] Yes TAKE 1 TABLET(5 MG) BY MOUTH TWICE DAILY Henson, [name], NP-C Taking Active Self  fexofenadine (ALLEGRA) 180 MG tablet [id number] Yes Take 180 mg by mouth daily as needed for allergies or rhinitis. [provider] Taking Active Self  glucose blood (ACCU-CHEK GUIDE TEST) test strip [id number] Yes USE ONE STRIP IN THE MORNING, AT NOON, AND AT BEDTIME Henson, [name], NP-C Taking Active Self  icosapent  Ethyl (VASCEPA ) 1 g capsule [id number]  Take 2 capsules (2 g total) by mouth 2 (two) times daily. Mallipeddi, Kennyth Pean, MD  Active Self           Med Note [place], JESSICA K   Fri Jun 23, 2023  5:38 AM) Not taking anymore  lidocaine  4 % [id number] Yes Place 1 patch onto the skin as needed (pain.). [provider] Taking Active Self  lisinopril  (ZESTRIL ) 2.5  MG tablet [id number]  TAKE 1 TABLET(2.5 MG) BY MOUTH DAILY Henson, [name], NP-C  Active Self  loperamide (IMODIUM) 2 MG capsule [id number] Yes Take 2-4 mg by mouth 4 (four) times daily as needed for diarrhea or loose stools. [provider] Taking Active Self  MAGNESIUM  PO [id number] Yes Take 500 mg by mouth in the morning and at bedtime. [provider] Taking Active Self  metFORMIN  (GLUCOPHAGE ) 500 MG tablet [id number] Yes TAKE 2 TABLETS BY MOUTH WITH BREAKFAST AND 1 TABLET WITH EVENING MEAL Henson, [name], NP-C Taking Active Self  methocarbamol  (ROBAXIN ) 500 MG tablet [id number] Yes Take 1 tablet (500 mg total) by mouth every 6 (six) hours as needed for muscle spasms. Arnie Lao, MD Taking Active   metoprolol  tartrate (LOPRESSOR ) 50 MG tablet [id number] Yes TAKE 1 TABLET(50 MG) BY MOUTH TWICE DAILY Henson, [name], NP-C Taking Active Self  mirtazapine  (REMERON ) 15 MG tablet [id number] Yes TAKE 1 TABLET(15 MG) BY MOUTH AT BEDTIME Henson, [name], NP-C Taking Active Self  Omega-3 Fatty Acids (FISH OIL) 1200 MG CAPS [id number] Yes Take 1,200 mg by mouth in the morning, at noon, and at bedtime. [provider] Taking Active Self  oxyCODONE  (OXY IR/ROXICODONE ) 5 MG immediate release tablet [id number] Yes Take 1-2 tablets (5-10 mg total) by mouth every 6 (six) hours as needed for moderate pain (pain score 4-6) (pain score 4-6). Arnie Lao, MD Taking Active   POTASSIUM PO [id number] Yes Take 595 mg by mouth in the morning and at bedtime. [provider] Taking Active Self  Propylene Glycol (SYSTANE COMPLETE) 0.6 % SOLN [id number] Yes Place 1 drop into both eyes See admin instructions. Instill one drop into both eyes scheduled twice daily, may use as needed for dry eyes [provider] Taking Active Self  rosuvastatin (CRESTOR) 5 MG tablet [id number] Yes TAKE 1 TABLET BY MOUTH DAILY Maree Shames, [name], NP-C Taking Active Self  Specialty Vitamins Products (MENOPAUSE SUPPORT PO) [id number] Yes Take 1 tablet by mouth daily.  Gynovite Plus - Menopause Supplement for Women [provider] Taking Active Self  Turmeric (QC TUMERIC COMPLEX PO) [id number] Yes Take 1,000 mg by mouth in the morning and at bedtime. [provider] Taking Active Self            Home Care and Equipment/Supplies: Were  Home Health Services Ordered?: Yes Name of Home Health Agency:: [facility] Has Agency set up a time to come to your home?: Yes First Home Health Visit Date: 06/28/23 Any new equipment or medical supplies ordered?: Yes Name of Medical supply agency?: [facility] Were you able to get the equipment/medical supplies?: Yes Do you have any questions related to the use of the equipment/supplies?: No  Functional Questionnaire: Do you need assistance with bathing/showering or dressing?: No Do you need assistance with meal preparation?: Yes Do you need assistance with eating?: No Do you have difficulty maintaining continence: No Do you need assistance with getting out of bed/getting out of a chair/moving?: No Do you have difficulty managing or taking your medications?: No  Follow up appointments reviewed: PCP Follow-up appointment confirmed?: NA (Patient has AWV 08/2023 no addl PCP visit wanted) Specialist Hospital Follow-up appointment confirmed?: Yes Date of Specialist follow-up appointment?: 07/06/23 Follow-Up Specialty Provider:: Dr Norberto Bear Do you need transportation to your follow-up appointment?: No Do you understand care options if your condition(s) worsen?: Yes-patient verbalized understanding  SDOH Interventions Today    Flowsheet Row Most Recent Value  SDOH Interventions  Food Insecurity Interventions Intervention Not Indicated  Housing Interventions Intervention Not Indicated  Transportation Interventions Intervention Not Indicated, Patient Resources (Friends/Family)  Utilities Interventions Intervention Not Indicated       Una Ganser BSN RN [facility] Health Care Management Coordinator [email or web address] Direct Dial: [phone]  Fax: [phone] Website: [email or web address]

## 2023-06-28 NOTE — Transitions of Care (Post Inpatient/ED Visit) (Signed)
   06/28/2023  Name: Nicole ILER MRN: [id number] DOB: 1951/01/03  Today's TOC FU Call Status: Today's TOC FU Call Status:: Unsuccessful Call (1st Attempt) Unsuccessful Call (1st Attempt) Date: 06/28/23  Attempted to reach the patient regarding the most recent Inpatient/ED visit.  Follow Up Plan: Additional outreach attempts will be made to reach the patient to complete the Transitions of Care (Post Inpatient/ED visit) call.   Una Ganser BSN RN [facility] Health Care Management Coordinator [email or web address] Direct Dial: [phone]  Fax: [phone] Website: [email or web address]

## 2023-07-06 ENCOUNTER — Encounter: Payer: Self-pay | Admitting: Orthopaedic Surgery

## 2023-07-06 ENCOUNTER — Ambulatory Visit (INDEPENDENT_AMBULATORY_CARE_PROVIDER_SITE_OTHER): Admitting: Orthopaedic Surgery

## 2023-07-06 DIAGNOSIS — Z96642 Presence of left artificial hip joint: Secondary | ICD-10-CM

## 2023-07-06 MED ORDER — OXYCODONE HCL 5 MG PO TABS
5.0000 mg | ORAL_TABLET | Freq: Four times a day (QID) | ORAL | 0 refills | Status: DC | PRN
Start: 2023-07-06 — End: 2023-08-30

## 2023-07-06 MED ORDER — TIZANIDINE HCL 4 MG PO TABS: 4.0000 mg | ORAL_TABLET | Freq: Three times a day (TID) | ORAL | 1 refills | Status: DC | PRN

## 2023-07-06 NOTE — Progress Notes (Signed)
 The patient is a 73 year old female who is here for her first postoperative visit status post a left total hip replacement to treat left hip avascular necrosis.  She states she is having trouble sleeping at night.  She is ambulating with a walker.  She is taking oxycodone  and Robaxin .  On exam her left hip incision looks good.  Staples removed and Steri-Strips applied.  She does have a very large seroma.  I did aspirate well over a 130 cc of fluid from this area.  She is on Eliquis  chronically.  She also has cirrhosis and we usually see large seromas in this clinical situation.  I gave her reassurance that this was normal.  Will see her back in a month if she continues to increase her mobility.  No x-rays are needed.  I did refill her oxycodone  and will change from Robaxin  to Zanaflex to see if this will help.

## 2023-07-09 ENCOUNTER — Other Ambulatory Visit: Payer: Self-pay | Admitting: Family Medicine

## 2023-07-26 ENCOUNTER — Other Ambulatory Visit: Payer: Self-pay | Admitting: Family Medicine

## 2023-07-27 ENCOUNTER — Other Ambulatory Visit: Payer: Self-pay

## 2023-07-27 DIAGNOSIS — H40013 Open angle with borderline findings, low risk, bilateral: Secondary | ICD-10-CM | POA: Diagnosis not present

## 2023-07-27 MED ORDER — MIRTAZAPINE 15 MG PO TABS: 15.0000 mg | ORAL_TABLET | Freq: Every day | ORAL | 0 refills | Status: DC

## 2023-08-01 ENCOUNTER — Other Ambulatory Visit: Payer: Self-pay | Admitting: Family Medicine

## 2023-08-01 DIAGNOSIS — H25813 Combined forms of age-related cataract, bilateral: Secondary | ICD-10-CM | POA: Diagnosis not present

## 2023-08-01 DIAGNOSIS — H268 Other specified cataract: Secondary | ICD-10-CM | POA: Diagnosis not present

## 2023-08-01 DIAGNOSIS — H2511 Age-related nuclear cataract, right eye: Secondary | ICD-10-CM | POA: Diagnosis not present

## 2023-08-03 ENCOUNTER — Other Ambulatory Visit: Payer: Self-pay | Admitting: Family Medicine

## 2023-08-04 ENCOUNTER — Other Ambulatory Visit: Payer: Self-pay | Admitting: Family Medicine

## 2023-08-07 ENCOUNTER — Encounter: Payer: Self-pay | Admitting: Orthopaedic Surgery

## 2023-08-07 ENCOUNTER — Ambulatory Visit (INDEPENDENT_AMBULATORY_CARE_PROVIDER_SITE_OTHER): Admitting: Orthopaedic Surgery

## 2023-08-07 DIAGNOSIS — Z96642 Presence of left artificial hip joint: Secondary | ICD-10-CM

## 2023-08-07 NOTE — Progress Notes (Signed)
 The patient is a 73 year old who is now just past 6 weeks status post a left total hip replacement.  She reports left knee pain but does have a remote history of knee surgery on that knee that was done elsewhere.  She also has foot and ankle swelling.  She is ambulating minimal and using wheelchair now and we recommend outpatient physical therapy but she thinks she can do this on her own.  She has not been using lidocaine  patches on her knee.  Her left hip is doing great and her motion is full with minimal discomfort from the hip.  It is really the knee and is bothering her the most.  We offered her outpatient physical therapy but she really feels like she can do this on her own.  I have encouraged her to mobilize as much she can.  We will see her back in 4 weeks and at that visit I would like just a standing AP pelvis.  She is someone who I think does deal with some chronic pain and her surgery was secondary to avascular necrosis due to remote history of significant alcohol  use.

## 2023-08-09 ENCOUNTER — Ambulatory Visit: Admitting: Physician Assistant

## 2023-08-09 DIAGNOSIS — H25812 Combined forms of age-related cataract, left eye: Secondary | ICD-10-CM | POA: Diagnosis not present

## 2023-08-14 ENCOUNTER — Other Ambulatory Visit: Payer: Self-pay | Admitting: Family Medicine

## 2023-08-14 MED ORDER — ELIQUIS 5 MG PO TABS: ORAL_TABLET | ORAL | 0 refills | Status: DC

## 2023-08-14 NOTE — Telephone Encounter (Signed)
 Copied from CRM [id number]. Topic: Clinical - Medication Refill >> Aug 14, 2023 10:52 AM Maisie C wrote: Medication: eliquis    Has the patient contacted their pharmacy? Yes No more refills   This is the patient's preferred pharmacy:  [id number] - [place], [place] - [address] [place] [address] Phone: [phone] Fax: [phone]  Is this the correct pharmacy for this prescription? Yes If no, delete pharmacy and type the correct one.   Has the prescription been filled recently? Yes  Is the patient out of the medication? Yes  Has the patient been seen for an appointment in the last year OR does the patient have an upcoming appointment? Yes  Can we respond through MyChart? Yes  Agent: Please be advised that Rx refills may take up to 3 business days. We ask that you follow-up with your pharmacy.

## 2023-08-15 DIAGNOSIS — J343 Hypertrophy of nasal turbinates: Secondary | ICD-10-CM | POA: Diagnosis not present

## 2023-08-15 DIAGNOSIS — Z886 Allergy status to analgesic agent status: Secondary | ICD-10-CM | POA: Diagnosis not present

## 2023-08-15 DIAGNOSIS — I10 Essential (primary) hypertension: Secondary | ICD-10-CM | POA: Diagnosis not present

## 2023-08-15 DIAGNOSIS — H268 Other specified cataract: Secondary | ICD-10-CM | POA: Diagnosis not present

## 2023-08-15 DIAGNOSIS — R519 Headache, unspecified: Secondary | ICD-10-CM | POA: Diagnosis not present

## 2023-08-15 DIAGNOSIS — Z79899 Other long term (current) drug therapy: Secondary | ICD-10-CM | POA: Diagnosis not present

## 2023-08-15 DIAGNOSIS — H25812 Combined forms of age-related cataract, left eye: Secondary | ICD-10-CM | POA: Diagnosis not present

## 2023-08-15 DIAGNOSIS — H2512 Age-related nuclear cataract, left eye: Secondary | ICD-10-CM | POA: Diagnosis not present

## 2023-08-15 DIAGNOSIS — Z885 Allergy status to narcotic agent status: Secondary | ICD-10-CM | POA: Diagnosis not present

## 2023-08-15 DIAGNOSIS — Z8673 Personal history of transient ischemic attack (TIA), and cerebral infarction without residual deficits: Secondary | ICD-10-CM | POA: Diagnosis not present

## 2023-08-15 DIAGNOSIS — J352 Hypertrophy of adenoids: Secondary | ICD-10-CM | POA: Diagnosis not present

## 2023-08-16 ENCOUNTER — Ambulatory Visit: Payer: 59

## 2023-08-16 VITALS — Ht 67.0 in | Wt 150.0 lb

## 2023-08-16 DIAGNOSIS — Z1231 Encounter for screening mammogram for malignant neoplasm of breast: Secondary | ICD-10-CM

## 2023-08-16 DIAGNOSIS — Z1211 Encounter for screening for malignant neoplasm of colon: Secondary | ICD-10-CM

## 2023-08-16 DIAGNOSIS — Z0000 Encounter for general adult medical examination without abnormal findings: Secondary | ICD-10-CM | POA: Diagnosis not present

## 2023-08-16 NOTE — Patient Instructions (Addendum)
 Ms. Nicole [name] , Thank you for taking time out of your busy schedule to complete your Annual Wellness Visit with me. I enjoyed our conversation and look forward to speaking with you again next year. I, as well as your care team,  appreciate your ongoing commitment to your health goals. Please review the following plan we discussed and let me know if I can assist you in the future. Your Game plan/ To Do List    Referrals: If you haven't heard from the office you've been referred to, please reach out to them at the phone provided.  Referral to [facility], Dr Avram, for a Screening Colonoscopy; Referral for a Screening Mammogram Follow up Visits: Next Medicare AWV with our clinical staff: 08/22/2024   Have you seen your provider in the last 6 months (3 months if uncontrolled diabetes)? Yes Next Office Visit with your provider: 08/30/2023 to discuss sleeping concerns  Clinician Recommendations:  Aim for 30 minutes of exercise or brisk walking, 6-8 glasses of water , and 5 servings of fruits and vegetables each day. Educated and advised on getting the Pneumonia, Shingles, Hepatitis B, and COVID vaccines in 2025.      This is a list of the screening recommended for you and due dates:  Health Maintenance  Topic Date Due   COVID-19 Vaccine (1) Never done   Complete foot exam   Never done   Zoster (Shingles) Vaccine (1 of 2) Never done   Colon Cancer Screening  Never done   Mammogram  Never done   Hepatitis B Vaccine (1 of 3 - Risk 3-dose series) Never done   Pneumococcal Vaccine for age over 95 (2 of 2 - PCV) 01/05/2018   Flu Shot  09/15/2023   Hemoglobin A1C  12/21/2023   Eye exam for diabetics  05/07/2024   DTaP/Tdap/Td vaccine (2 - Td or Tdap) 12/03/2025   DEXA scan (bone density measurement)  Completed   Hepatitis C Screening  Completed   HPV Vaccine  Aged Out   Meningitis B Vaccine  Aged Out    Advanced directives: (In Chart) A copy of your advanced directives are scanned into your chart  should your provider ever need it. Advance Care Planning is important because it:  [x]  Makes sure you receive the medical care that is consistent with your values, goals, and preferences  [x]  It provides guidance to your family and loved ones and reduces their decisional burden about whether or not they are making the right decisions based on your wishes.  Follow the link provided in your after visit summary or read over the paperwork we have mailed to you to help you started getting your Advance Directives in place. If you need assistance in completing these, please reach out to us  so that we can help you!

## 2023-08-16 NOTE — Progress Notes (Signed)
 Subjective:   Nicole [name] is a 73 y.o. who presents for a Medicare Wellness preventive visit.  As a reminder, Annual Wellness Visits don't include a physical exam, and some assessments may be limited, especially if this visit is performed virtually. We may recommend an in-person follow-up visit with your provider if needed.  Visit Complete: Virtual I connected with  Nicole [name] on 08/16/23 by a audio enabled telemedicine application and verified that I am speaking with the correct person using two identifiers.  Patient Location: Home  Provider Location: Office/Clinic  I discussed the limitations of evaluation and management by telemedicine. The patient expressed understanding and agreed to proceed.  Vital Signs: Because this visit was a virtual/telehealth visit, some criteria may be missing or patient reported. Any vitals not documented were not able to be obtained and vitals that have been documented are patient reported.  VideoDeclined- This patient declined Librarian, academic. Therefore the visit was completed with audio only.  Persons Participating in Visit: Patient.  AWV Questionnaire: No: Patient Medicare AWV questionnaire was not completed prior to this visit.  Cardiac Risk Factors include: advanced age (>61men, >68 women);hypertension;dyslipidemia;diabetes mellitus     Objective:    Today's Vitals   08/16/23 1312  Weight: 150 lb (68 kg)  Height: 5' 7 (1.702 m)   Body mass index is 23.49 kg/m.     08/16/2023    1:12 PM 06/23/2023    5:39 AM 06/20/2023    2:10 PM 08/09/2022    2:49 PM 06/22/2020   12:58 PM 06/20/2020    7:18 PM 01/06/2020   11:08 AM  Advanced Directives  Does Patient Have a Medical Advance Directive? Yes No No Yes No No No  Type of Estate agent of [place];Living will   Healthcare Power of [place];Living will     Does patient want to make changes to medical advance directive? No - Patient  declined   No - Patient declined     Copy of Healthcare Power of Attorney in Chart? Yes - validated most recent copy scanned in chart (See row information)   Yes - validated most recent copy scanned in chart (See row information)     Would patient like information on creating a medical advance directive?  No - Patient declined No - Patient declined   No - Patient declined     Current Medications (verified) Outpatient Encounter Medications as of 08/16/2023  Medication Sig   Accu-Chek Softclix Lancets lancets USE ONE LANCET IN THE MORNING, AT NOON, AND AT BEDTIME   acetaminophen  (TYLENOL ) 500 MG tablet Take 500 mg by mouth 2 (two) times daily.   Ascorbic Acid  (VITAMIN C ) 1000 MG tablet Take 1,000 mg by mouth in the morning.   b complex vitamins capsule Take 1 capsule by mouth daily.   Blood Glucose Monitoring Suppl DEVI 1 each by Does not apply route in the morning, at noon, and at bedtime. May substitute to any manufacturer covered by patient's insurance.   Calcium  Carbonate (CALCIUM  600 PO) Take 600 mg by mouth in the morning and at bedtime.   Cholecalciferol (VITAMIN D3) 125 MCG (5000 UT) TABS Take 5,000 Units by mouth daily.   digoxin  (LANOXIN ) 0.125 MG tablet Take 0.5 tablets (0.0625 mg total) by mouth daily.   ELIQUIS  5 MG TABS tablet TAKE 1 TABLET(5 MG) BY MOUTH TWICE DAILY   fexofenadine (ALLEGRA) 180 MG tablet Take 180 mg by mouth daily as needed for allergies or rhinitis.  glucose blood (ACCU-CHEK GUIDE TEST) test strip USE ONE STRIP IN THE MORNING, AT NOON, AND AT BEDTIME   icosapent  Ethyl (VASCEPA ) 1 g capsule Take 2 capsules (2 g total) by mouth 2 (two) times daily.   lidocaine  4 % Place 1 patch onto the skin as needed (pain.).   lisinopril  (ZESTRIL ) 2.5 MG tablet TAKE 1 TABLET BY MOUTH EVERY DAY   loperamide (IMODIUM) 2 MG capsule Take 2-4 mg by mouth 4 (four) times daily as needed for diarrhea or loose stools.   MAGNESIUM  PO Take 500 mg by mouth in the morning and at bedtime.    metFORMIN  (GLUCOPHAGE ) 500 MG tablet TAKE 2 TABLETS BY MOUTH WITH BREAKFAST AND 1 TABLET WITH EVENING MEAL   metoprolol  tartrate (LOPRESSOR ) 50 MG tablet TAKE 1 TABLET(50 MG) BY MOUTH TWICE DAILY   mirtazapine  (REMERON ) 15 MG tablet TAKE 1 TABLET (15 MG TOTAL) BY MOUTH AT BEDTIME. TAKE 1 TABLET(15 MG) BY MOUTH AT BEDTIME   Omega-3 Fatty Acids (FISH OIL) 1200 MG CAPS Take 1,200 mg by mouth in the morning, at noon, and at bedtime.   POTASSIUM PO Take 595 mg by mouth in the morning and at bedtime.   Propylene Glycol (SYSTANE COMPLETE) 0.6 % SOLN Place 1 drop into both eyes See admin instructions. Instill one drop into both eyes scheduled twice daily, may use as needed for dry eyes   rosuvastatin (CRESTOR) 5 MG tablet TAKE 1 TABLET BY MOUTH EVERY DAY   Specialty Vitamins Products (MENOPAUSE SUPPORT PO) Take 1 tablet by mouth daily.  Gynovite Plus - Menopause Supplement for Women   tiZANidine  (ZANAFLEX ) 4 MG tablet Take 1 tablet (4 mg total) by mouth every 8 (eight) hours as needed for muscle spasms.   Turmeric (QC TUMERIC COMPLEX PO) Take 1,000 mg by mouth in the morning and at bedtime.   diclofenac Sodium (VOLTAREN) 1 % GEL Apply 2 g topically 4 (four) times daily as needed. (Patient not taking: Reported on 08/16/2023)   oxyCODONE  (OXY IR/ROXICODONE ) 5 MG immediate release tablet Take 1-2 tablets (5-10 mg total) by mouth every 6 (six) hours as needed for moderate pain (pain score 4-6) (pain score 4-6). (Patient not taking: Reported on 08/16/2023)   No facility-administered encounter medications on file as of 08/16/2023.    Allergies (verified) Other and Iodinated contrast media   History: Past Medical History:  Diagnosis Date   Anemia    Arthritis    Asthma    as a child   Atrial fibrillation with RVR (HCC)    Cancer (HCC)    right kidney   CHF (congestive heart failure) (HCC)    Chronic alcoholism (HCC)    Chronic kidney disease    has solitary kidney ( right nephrectomy for cancer)    Chronic left shoulder pain    Depression    Diabetes mellitus without complication (HCC)    Dysrhythmia    Atrial Fib with RVR    GERD (gastroesophageal reflux disease)    Heartburn    History of blood transfusion    History of hiatal hernia    Hypertension    Liver cirrhosis, alcoholic (HCC)    Memory loss    Schizophrenia (HCC)    patient denies having   Seizures (HCC)    last 2024, no meds and she refuses to see neurology   Thyroid  nodule    Past Surgical History:  Procedure Laterality Date   ANKLE FRACTURE SURGERY Right 2008   screws   ANKLE FRACTURE  SURGERY Left 2008   plates   ESOPHAGOGASTRODUODENOSCOPY (EGD) WITH PROPOFOL  N/A 12/18/2016   Procedure: ESOPHAGOGASTRODUODENOSCOPY (EGD) WITH PROPOFOL ;  Surgeon: Dianna Specking, MD;  Location: [facility] ENDOSCOPY;  Service: Endoscopy;  Laterality: N/A;   JOINT REPLACEMENT Left    total knee arthroplasty done at [facility], can't remember when it was   NASAL SINUS SURGERY Left 11/17/2017   Procedure: ENDOSCOPIC SINUS approach;  Surgeon: Carlie Clark, MD;  Location: [facility] OR;  Service: ENT;  Laterality: Left;   NEPHRECTOMY Right    robotic right radical  for cancer.   ORIF ORBITAL FRACTURE Left 11/17/2017   Procedure: Trans Antral Left sided orbital floor fracture repair with dissovable floor implant;  Surgeon: Carlie Clark, MD;  Location: [facility] OR;  Service: ENT;  Laterality: Left;   TONSILLECTOMY     TOTAL HIP ARTHROPLASTY Left 06/23/2023   Procedure: ARTHROPLASTY, HIP, TOTAL, ANTERIOR APPROACH;  Surgeon: Vernetta Lonni GRADE, MD;  Location: [facility] ORS;  Service: Orthopedics;  Laterality: Left;   Family History  Problem Relation Age of Onset   Stroke Mother    Heart attack Father    Ulcers Father    Diabetes Sister    Cancer Brother    Diabetes Sister    Diabetes Sister    Social History   Socioeconomic History   Marital status: Divorced    Spouse name: Not on file   Number of children: Not on file   Years of education: Not on  file   Highest education level: Associate degree: academic program  Occupational History   Not on file  Tobacco Use   Smoking status: Former    Current packs/day: 0.00    Average packs/day: 1 pack/day for 15.0 years (15.0 ttl pk-yrs)    Types: Cigarettes    Start date: 11/13/1992    Quit date: 11/14/2007    Years since quitting: 15.7   Smokeless tobacco: Never  Vaping Use   Vaping status: Never Used  Substance and Sexual Activity   Alcohol  use: Not Currently    Comment: none since fall 11/13/17   Drug use: No   Sexual activity: Not Currently  Other Topics Concern   Not on file  Social History Narrative   quit drinking in late August but went on a binge over last weekend (october 26, 26, 28).  Hasn't drank since Sunday (6 days ago) due to abd pain, N, V   Social Drivers of Health   Financial Resource Strain: Medium Risk (08/16/2023)   Overall Financial Resource Strain (CARDIA)    Difficulty of Paying Living Expenses: Somewhat hard  Food Insecurity: No Food Insecurity (08/16/2023)   Hunger Vital Sign    Worried About Running Out of Food in the Last Year: Never true    Ran Out of Food in the Last Year: Never true  Transportation Needs: No Transportation Needs (08/16/2023)   PRAPARE - Administrator, Civil Service (Medical): No    Lack of Transportation (Non-Medical): No  Physical Activity: Inactive (08/16/2023)   Exercise Vital Sign    Days of Exercise per Week: 0 days    Minutes of Exercise per Session: 0 min  Stress: Stress Concern Present (08/16/2023)   [employer] of Occupational Health - Occupational Stress Questionnaire    Feeling of Stress: Very much  Social Connections: Socially Isolated (08/16/2023)   Social Connection and Isolation Panel    Frequency of Communication with Friends and Family: Once a week    Frequency of Social Gatherings with Friends  and Family: Never    Attends Religious Services: Never    Database administrator or Organizations: No     Attends Engineer, structural: Never    Marital Status: Divorced    Tobacco Counseling Counseling given: No    Clinical Intake:  Pre-visit preparation completed: Yes  Pain : No/denies pain     BMI - recorded: 23.49 Nutritional Status: BMI of 19-24  Normal Nutritional Risks: None Diabetes: Yes CBG done?: Yes CBG resulted in Enter/ Edit results?: Yes (fasting - 126) Did pt. bring in CBG monitor from home?: No  Lab Results  Component Value Date   HGBA1C 5.2 06/20/2023   HGBA1C 6.4 02/22/2023   HGBA1C 6.3 10/20/2022     How often do you need to have someone help you when you read instructions, pamphlets, or other written materials from your doctor or pharmacy?: 1 - Never  Interpreter Needed?: No  Information entered by :: Verdie Saba, CMA   Activities of Daily Living     08/16/2023    1:19 PM 06/23/2023   10:56 AM  In your present state of health, do you have any difficulty performing the following activities:  Hearing? 0 0  Vision? 0 1  Difficulty concentrating or making decisions? 0 1  Walking or climbing stairs? 0   Dressing or bathing? 0   Doing errands, shopping? 0 0  Preparing Food and eating ? N   Using the Toilet? N   In the past six months, have you accidently leaked urine? N   Do you have problems with loss of bowel control? N   Managing your Medications? N   Managing your Finances? N   Housekeeping or managing your Housekeeping? N     Patient Care Team: Lendia Boby CROME, NP-C as PCP - General (Family Medicine) Stacia Diannah SQUIBB, MD as PCP - Cardiology (Cardiology) Fleeta Zerita DASEN, MD as Consulting Physician (Ophthalmology) Vernetta Berg, MD as Consulting Physician (General Surgery)  I have updated your Care Teams any recent Medical Services you may have received from other providers in the past year.     Assessment:   This is a routine wellness examination for Nicole [name].  Hearing/Vision screen Hearing Screening - Comments::  Denies hearing difficulties   Vision Screening - Comments:: Wears rx glasses - up to date with routine eye exams with Dr Fleeta   Goals Addressed               This Visit's Progress     Patient Stated (pt-stated)        Patient stated she's just had a hip replacement and hoping to heal soon and properly and be able to walk better/more       Depression Screen     08/16/2023    1:20 PM 06/28/2023    2:43 PM 08/09/2022    2:46 PM 06/08/2022    4:15 PM 05/05/2022    3:57 PM  PHQ 2/9 Scores  PHQ - 2 Score 0 0 0 4 1  PHQ- 9 Score 6   13 4     Fall Risk     08/16/2023    1:19 PM 08/09/2022    2:48 PM 05/05/2022    3:57 PM  Fall Risk   Falls in the past year? 0 0 0  Number falls in past yr: 0 0 0  Injury with Fall? 0 0 0  Risk for fall due to : No Fall Risks No Fall Risks Impaired balance/gait;Impaired mobility  Follow up Falls evaluation completed;Falls prevention discussed Falls prevention discussed Falls evaluation completed    MEDICARE RISK AT HOME:  Medicare Risk at Home Any stairs in or around the home?: No If so, are there any without handrails?: No Home free of loose throw rugs in walkways, pet beds, electrical cords, etc?: Yes Adequate lighting in your home to reduce risk of falls?: Yes Life alert?: No Use of a cane, walker or w/c?: Yes (cane/walker) Grab bars in the bathroom?: Yes Shower chair or bench in shower?: Yes Elevated toilet seat or a handicapped toilet?: Yes  TIMED UP AND GO:  Was the test performed?  No  Cognitive Function: 6CIT completed        08/16/2023    1:26 PM 08/09/2022    2:50 PM  6CIT Screen  What Year? 0 points 0 points  What month? 0 points 0 points  What time? 0 points 0 points  Count back from 20 0 points 0 points  Months in reverse 0 points 0 points  Repeat phrase 0 points 0 points  Total Score 0 points 0 points    Immunizations Immunization History  Administered Date(s) Administered   Influenza, High Dose Seasonal PF  12/20/2016, 01/05/2017   Pneumococcal Polysaccharide-23 01/05/2017   Tdap 12/04/2015    Screening Tests Health Maintenance  Topic Date Due   COVID-19 Vaccine (1) Never done   FOOT EXAM  Never done   Zoster Vaccines- Shingrix (1 of 2) Never done   Colonoscopy  Never done   MAMMOGRAM  Never done   Hepatitis B Vaccines (1 of 3 - Risk 3-dose series) Never done   Pneumococcal Vaccine: 50+ Years (2 of 2 - PCV) 01/05/2018   INFLUENZA VACCINE  09/15/2023   HEMOGLOBIN A1C  12/21/2023   OPHTHALMOLOGY EXAM  05/07/2024   DTaP/Tdap/Td (2 - Td or Tdap) 12/03/2025   DEXA SCAN  Completed   Hepatitis C Screening  Completed   HPV VACCINES  Aged Out   Meningococcal B Vaccine  Aged Out    Health Maintenance  Health Maintenance Due  Topic Date Due   COVID-19 Vaccine (1) Never done   FOOT EXAM  Never done   Zoster Vaccines- Shingrix (1 of 2) Never done   Colonoscopy  Never done   MAMMOGRAM  Never done   Hepatitis B Vaccines (1 of 3 - Risk 3-dose series) Never done   Pneumococcal Vaccine: 50+ Years (2 of 2 - PCV) 01/05/2018   Health Maintenance Items Addressed:  Mammogram ordered, Referral sent to GI for colonoscopy, Diabetic Foot Exam recommended (Pt declined to be referred to a Podiatrist but prefers done by PCP).  Additional Screening:  Vision Screening: Recommended annual ophthalmology exams for early detection of glaucoma and other disorders of the eye. Would you like a referral to an eye doctor? No    Dental Screening: Recommended annual dental exams for proper oral hygiene  Community Resource Referral / Chronic Care Management: CRR required this visit?  No   CCM required this visit?  No   Plan:    I have personally reviewed and noted the following in the patient's chart:   Medical and social history Use of alcohol , tobacco or illicit drugs  Current medications and supplements including opioid prescriptions. Patient is not currently taking opioid prescriptions. Functional  ability and status Nutritional status Physical activity Advanced directives List of other physicians Hospitalizations, surgeries, and ER visits in previous 12 months Vitals Screenings to include cognitive, depression, and falls Referrals and  appointments  In addition, I have reviewed and discussed with patient certain preventive protocols, quality metrics, and best practice recommendations. A written personalized care plan for preventive services as well as general preventive health recommendations were provided to patient.   Verdie CHRISTELLA Saba, CMA   08/16/2023   After Visit Summary: (MyChart) Due to this being a telephonic visit, the after visit summary with patients personalized plan was offered to patient via MyChart   Notes: Nothing significant to report at this time.

## 2023-08-17 ENCOUNTER — Other Ambulatory Visit: Payer: Self-pay | Admitting: Family Medicine

## 2023-08-29 ENCOUNTER — Encounter: Payer: Self-pay | Admitting: Gastroenterology

## 2023-08-29 DIAGNOSIS — R030 Elevated blood-pressure reading, without diagnosis of hypertension: Secondary | ICD-10-CM | POA: Diagnosis not present

## 2023-08-29 DIAGNOSIS — N390 Urinary tract infection, site not specified: Secondary | ICD-10-CM | POA: Diagnosis not present

## 2023-08-30 ENCOUNTER — Ambulatory Visit: Admitting: Family Medicine

## 2023-08-30 ENCOUNTER — Encounter: Payer: Self-pay | Admitting: Family Medicine

## 2023-08-30 VITALS — BP 132/84 | HR 88 | Temp 97.6°F | Ht 67.0 in | Wt 150.0 lb

## 2023-08-30 DIAGNOSIS — E1169 Type 2 diabetes mellitus with other specified complication: Secondary | ICD-10-CM | POA: Diagnosis not present

## 2023-08-30 DIAGNOSIS — I110 Hypertensive heart disease with heart failure: Secondary | ICD-10-CM | POA: Diagnosis not present

## 2023-08-30 DIAGNOSIS — E785 Hyperlipidemia, unspecified: Secondary | ICD-10-CM

## 2023-08-30 DIAGNOSIS — I509 Heart failure, unspecified: Secondary | ICD-10-CM | POA: Diagnosis not present

## 2023-08-30 DIAGNOSIS — E559 Vitamin D deficiency, unspecified: Secondary | ICD-10-CM

## 2023-08-30 DIAGNOSIS — I5032 Chronic diastolic (congestive) heart failure: Secondary | ICD-10-CM | POA: Diagnosis not present

## 2023-08-30 DIAGNOSIS — Z905 Acquired absence of kidney: Secondary | ICD-10-CM

## 2023-08-30 DIAGNOSIS — Z7984 Long term (current) use of oral hypoglycemic drugs: Secondary | ICD-10-CM | POA: Diagnosis not present

## 2023-08-30 DIAGNOSIS — K529 Noninfective gastroenteritis and colitis, unspecified: Secondary | ICD-10-CM | POA: Diagnosis not present

## 2023-08-30 DIAGNOSIS — I4821 Permanent atrial fibrillation: Secondary | ICD-10-CM

## 2023-08-30 DIAGNOSIS — M810 Age-related osteoporosis without current pathological fracture: Secondary | ICD-10-CM | POA: Diagnosis not present

## 2023-08-30 DIAGNOSIS — E781 Pure hyperglyceridemia: Secondary | ICD-10-CM

## 2023-08-30 DIAGNOSIS — Z7689 Persons encountering health services in other specified circumstances: Secondary | ICD-10-CM | POA: Insufficient documentation

## 2023-08-30 DIAGNOSIS — N1832 Chronic kidney disease, stage 3b: Secondary | ICD-10-CM

## 2023-08-30 NOTE — Assessment & Plan Note (Addendum)
 On anticoagulant and doing well. Followed by cardiologist

## 2023-08-30 NOTE — Assessment & Plan Note (Signed)
 Euvolemic. Followed by cardiology.

## 2023-08-30 NOTE — Patient Instructions (Addendum)
 Please work on good sleep hygiene by getting a sleep routine. Avoid watching tv or having noise while you fall asleep.  Avoid eating and drinking at least 2 hours before you want to go to bed.  Do not drink caffeine in the evening.  If you are in the bed for 30 minutes and are not asleep, get up and do something until you feel sleepy and then go back to bed.  No napping during the day   Call and schedule with your kidney specialist, Dr. Tobie [phone]  Call and re-schedule with the osteoporosis clinic at [facility]  [phone]  Please call and schedule your follow up with your cardiologist [phone]

## 2023-08-30 NOTE — Assessment & Plan Note (Signed)
 Uncontrolled on medications.  Saw cardiologist for this. Taking omega 3 fatty acids.

## 2023-08-30 NOTE — Assessment & Plan Note (Signed)
 She did not show for appt at osteoporosis clinic. She was dealing with hip surgery. She will call to schedule appt

## 2023-08-30 NOTE — Progress Notes (Signed)
 Subjective:     Patient ID: Nicole [name] Single, female    DOB: Oct 24, 1950, 73 y.o.   MRN: [id number]  Chief Complaint  Patient presents with   Medical Management of Chronic Issues    Wants something to help her sleep at night    HPI  History of Present Illness         She is here with concerns related to insomnia and to follow up on chronic health conditions.   Nephrologist- Dr. Tobie  Cardiologist- Dr. Stacia  Orthopedist- Dr.  DARLYN- she has not seen them yet, has appt Podiatrist   PMH includes:  permanent A-fib on anticoagulation, chronic diastolic heart failure, hypertension, hypertriglyceridemia, hyperlipidemia, GERD, cirrhosis of the liver, T2DM, kidney cancer, and recent left total hip arthroplasty   Nephrology ordered renal US  but she did not get this done   States mirtazapine  is not helping her sleep  Sleeping with tv on. States she wakes up in the early morning and then falls back to sleep. She naps.    BS at home is controlled.  On metformin   Last A1c 5.2% May 2025   States she is on Macrobid for UTI diagnosed yesterday in [place] at urgent care.   She had cataract surgery on both eyes  She is taking fish oil bid      Health Maintenance Due  Topic Date Due   COVID-19 Vaccine (1) Never done   FOOT EXAM  Never done   Zoster Vaccines- Shingrix (1 of 2) Never done   Colonoscopy  Never done   MAMMOGRAM  Never done   Hepatitis B Vaccines (1 of 3 - Risk 3-dose series) Never done   Pneumococcal Vaccine: 50+ Years (2 of 2 - PCV) 01/05/2018    Past Medical History:  Diagnosis Date   Anemia    Arthritis    Asthma    as a child   Atrial fibrillation with RVR (HCC)    Cancer (HCC)    right kidney   CHF (congestive heart failure) (HCC)    Chronic alcoholism (HCC)    Chronic kidney disease    has solitary kidney ( right nephrectomy for cancer)   Chronic left shoulder pain    Depression    Diabetes mellitus without complication (HCC)    Dysrhythmia     Atrial Fib with RVR    GERD (gastroesophageal reflux disease)    Heartburn    History of blood transfusion    History of hiatal hernia    Hypertension    Liver cirrhosis, alcoholic (HCC)    Memory loss    Schizophrenia (HCC)    patient denies having   Seizures (HCC)    last 2024, no meds and she refuses to see neurology   Thyroid  nodule     Past Surgical History:  Procedure Laterality Date   ANKLE FRACTURE SURGERY Right 2008   screws   ANKLE FRACTURE SURGERY Left 2008   plates   ESOPHAGOGASTRODUODENOSCOPY (EGD) WITH PROPOFOL  N/A 12/18/2016   Procedure: ESOPHAGOGASTRODUODENOSCOPY (EGD) WITH PROPOFOL ;  Surgeon: Dianna Specking, MD;  Location: [facility] ENDOSCOPY;  Service: Endoscopy;  Laterality: N/A;   JOINT REPLACEMENT Left    total knee arthroplasty done at [facility], can't remember when it was   NASAL SINUS SURGERY Left 11/17/2017   Procedure: ENDOSCOPIC SINUS approach;  Surgeon: Carlie Clark, MD;  Location: [facility] OR;  Service: ENT;  Laterality: Left;   NEPHRECTOMY Right    robotic right radical  for cancer.   ORIF  ORBITAL FRACTURE Left 11/17/2017   Procedure: Trans Antral Left sided orbital floor fracture repair with dissovable floor implant;  Surgeon: Carlie Clark, MD;  Location: [facility] OR;  Service: ENT;  Laterality: Left;   TONSILLECTOMY     TOTAL HIP ARTHROPLASTY Left 06/23/2023   Procedure: ARTHROPLASTY, HIP, TOTAL, ANTERIOR APPROACH;  Surgeon: Vernetta Lonni GRADE, MD;  Location: [facility] ORS;  Service: Orthopedics;  Laterality: Left;    Family History  Problem Relation Age of Onset   Stroke Mother    Heart attack Father    Ulcers Father    Diabetes Sister    Cancer Brother    Diabetes Sister    Diabetes Sister     Social History   Socioeconomic History   Marital status: Divorced    Spouse name: Not on file   Number of children: Not on file   Years of education: Not on file   Highest education level: Associate degree: academic program  Occupational History   Not on  file  Tobacco Use   Smoking status: Former    Current packs/day: 0.00    Average packs/day: 1 pack/day for 15.0 years (15.0 ttl pk-yrs)    Types: Cigarettes    Start date: 11/13/1992    Quit date: 11/14/2007    Years since quitting: 15.8   Smokeless tobacco: Never  Vaping Use   Vaping status: Never Used  Substance and Sexual Activity   Alcohol  use: Not Currently    Comment: none since fall 11/13/17   Drug use: No   Sexual activity: Not Currently  Other Topics Concern   Not on file  Social History Narrative   quit drinking in late August but went on a binge over last weekend (october 26, 26, 28).  Hasn't drank since Sunday (6 days ago) due to abd pain, N, V   Social Drivers of Health   Financial Resource Strain: Medium Risk (08/16/2023)   Overall Financial Resource Strain (CARDIA)    Difficulty of Paying Living Expenses: Somewhat hard  Food Insecurity: No Food Insecurity (08/16/2023)   Hunger Vital Sign    Worried About Running Out of Food in the Last Year: Never true    Ran Out of Food in the Last Year: Never true  Transportation Needs: No Transportation Needs (08/16/2023)   PRAPARE - Administrator, Civil Service (Medical): No    Lack of Transportation (Non-Medical): No  Physical Activity: Inactive (08/16/2023)   Exercise Vital Sign    Days of Exercise per Week: 0 days    Minutes of Exercise per Session: 0 min  Stress: Stress Concern Present (08/16/2023)   [employer] of Occupational Health - Occupational Stress Questionnaire    Feeling of Stress: Very much  Social Connections: Socially Isolated (08/16/2023)   Social Connection and Isolation Panel    Frequency of Communication with Friends and Family: Once a week    Frequency of Social Gatherings with Friends and Family: Never    Attends Religious Services: Never    Database administrator or Organizations: No    Attends Banker Meetings: Never    Marital Status: Divorced  Catering manager Violence:  Not At Risk (08/16/2023)   Humiliation, Afraid, Rape, and Kick questionnaire    Fear of Current or Ex-Partner: No    Emotionally Abused: No    Physically Abused: No    Sexually Abused: No    Outpatient Medications Prior to Visit  Medication Sig Dispense Refill   Accu-Chek Softclix Lancets  lancets USE ONE LANCET IN THE MORNING, AT NOON, AND AT BEDTIME 300 each 3   acetaminophen  (TYLENOL ) 500 MG tablet Take 500 mg by mouth 2 (two) times daily.     Ascorbic Acid  (VITAMIN C ) 1000 MG tablet Take 1,000 mg by mouth in the morning.     b complex vitamins capsule Take 1 capsule by mouth daily.     Blood Glucose Monitoring Suppl DEVI 1 each by Does not apply route in the morning, at noon, and at bedtime. May substitute to any manufacturer covered by patient's insurance. 1 each 0   Calcium  Carbonate (CALCIUM  600 PO) Take 600 mg by mouth in the morning and at bedtime.     Cholecalciferol (VITAMIN D3) 125 MCG (5000 UT) TABS Take 5,000 Units by mouth daily.     digoxin  (LANOXIN ) 0.125 MG tablet Take 0.5 tablets (0.0625 mg total) by mouth daily. 45 tablet 3   ELIQUIS  5 MG TABS tablet TAKE 1 TABLET(5 MG) BY MOUTH TWICE DAILY 180 tablet 0   fexofenadine (ALLEGRA) 180 MG tablet Take 180 mg by mouth daily as needed for allergies or rhinitis.     glucose blood (ACCU-CHEK GUIDE TEST) test strip USE ONE STRIP IN THE MORNING, AT NOON, AND AT BEDTIME 300 strip 3   lidocaine  4 % Place 1 patch onto the skin as needed (pain.).     lisinopril  (ZESTRIL ) 2.5 MG tablet TAKE 1 TABLET BY MOUTH EVERY DAY 90 tablet 1   loperamide (IMODIUM) 2 MG capsule Take 2-4 mg by mouth 4 (four) times daily as needed for diarrhea or loose stools.     MAGNESIUM  PO Take 500 mg by mouth in the morning and at bedtime.     metFORMIN  (GLUCOPHAGE ) 500 MG tablet TAKE 2 TABLETS BY MOUTH WITH BREAKFAST AND 1 TABLET WITH EVENING MEAL 270 tablet 1   metoprolol  tartrate (LOPRESSOR ) 50 MG tablet TAKE 1 TABLET BY MOUTH TWICE A DAY 180 tablet 1    mirtazapine  (REMERON ) 15 MG tablet TAKE 1 TABLET (15 MG TOTAL) BY MOUTH AT BEDTIME. TAKE 1 TABLET(15 MG) BY MOUTH AT BEDTIME 90 tablet 0   moxifloxacin (VIGAMOX) 0.5 % ophthalmic solution Place 1 drop into the right eye 4 (four) times daily.     nitrofurantoin, macrocrystal-monohydrate, (MACROBID) 100 MG capsule Take 100 mg by mouth 2 (two) times daily.     Omega-3 Fatty Acids (FISH OIL) 1200 MG CAPS Take 1,200 mg by mouth in the morning, at noon, and at bedtime.     POTASSIUM PO Take 595 mg by mouth in the morning and at bedtime.     prednisoLONE  acetate (PRED FORTE ) 1 % ophthalmic suspension Place 1 drop into the right eye 4 (four) times daily.     Propylene Glycol (SYSTANE COMPLETE) 0.6 % SOLN Place 1 drop into both eyes See admin instructions. Instill one drop into both eyes scheduled twice daily, may use as needed for dry eyes     rosuvastatin (CRESTOR) 5 MG tablet TAKE 1 TABLET BY MOUTH EVERY DAY 90 tablet 1   Specialty Vitamins Products (MENOPAUSE SUPPORT PO) Take 1 tablet by mouth daily.  Gynovite Plus - Menopause Supplement for Women     tiZANidine  (ZANAFLEX ) 4 MG tablet Take 1 tablet (4 mg total) by mouth every 8 (eight) hours as needed for muscle spasms. 30 tablet 1   Turmeric (QC TUMERIC COMPLEX PO) Take 1,000 mg by mouth in the morning and at bedtime.     diclofenac Sodium (VOLTAREN) 1 %  GEL Apply 2 g topically 4 (four) times daily as needed. (Patient not taking: Reported on 08/16/2023)     icosapent  Ethyl (VASCEPA ) 1 g capsule Take 2 capsules (2 g total) by mouth 2 (two) times daily. 120 capsule 3   oxyCODONE  (OXY IR/ROXICODONE ) 5 MG immediate release tablet Take 1-2 tablets (5-10 mg total) by mouth every 6 (six) hours as needed for moderate pain (pain score 4-6) (pain score 4-6). (Patient not taking: Reported on 08/16/2023) 30 tablet 0   No facility-administered medications prior to visit.    Allergies  Allergen Reactions   Other Shortness Of Breath and Anxiety    UNSPECIFIED REACTION  to fast foods and processed food like hot dogs   Iodinated Contrast Media Swelling    Hand swelling from contrast dye for MRI    Review of Systems  Constitutional:  Negative for chills, fever and malaise/fatigue.  Eyes:  Negative for blurred vision and double vision.  Respiratory:  Negative for cough and shortness of breath.   Cardiovascular:  Negative for chest pain, palpitations and leg swelling.  Gastrointestinal:  Positive for diarrhea. Negative for abdominal pain, constipation, nausea and vomiting.  Genitourinary:  Negative for dysuria, frequency and urgency.  Musculoskeletal:  Negative for falls.  Neurological:  Negative for dizziness, focal weakness and headaches.  Psychiatric/Behavioral:  Negative for depression. The patient is not nervous/anxious.        Sleep routine altered       Objective:    Physical Exam Constitutional:      General: She is not in acute distress.    Appearance: She is not ill-appearing.  HENT:     Mouth/Throat:     Mouth: Mucous membranes are moist.     Pharynx: Oropharynx is clear.  Eyes:     Extraocular Movements: Extraocular movements intact.     Conjunctiva/sclera: Conjunctivae normal.  Cardiovascular:     Rate and Rhythm: Normal rate. Rhythm irregular.  Pulmonary:     Effort: Pulmonary effort is normal.     Breath sounds: Normal breath sounds.  Musculoskeletal:     Cervical back: Normal range of motion and neck supple.     Right lower leg: No edema.     Left lower leg: No edema.  Skin:    General: Skin is warm and dry.  Neurological:     General: No focal deficit present.     Mental Status: She is alert and oriented to person, place, and time.     Motor: No weakness.     Coordination: Coordination normal.  Psychiatric:        Mood and Affect: Mood normal.        Behavior: Behavior normal.        Thought Content: Thought content normal.      BP 132/84 (BP Location: Left Arm, Patient Position: Sitting)   Pulse 88   Temp  97.6 F (36.4 C) (Temporal)   Ht 5' 7 (1.702 m)   Wt 150 lb (68 kg)   SpO2 98%   BMI 23.49 kg/m  Wt Readings from Last 3 Encounters:  08/30/23 150 lb (68 kg)  08/16/23 150 lb (68 kg)  06/23/23 150 lb (68 kg)       Assessment & Plan:   Problem List Items Addressed This Visit     Hypertriglyceridemia (Chronic)   Uncontrolled on medications.  Saw cardiologist for this. Taking omega 3 fatty acids.       Permanent atrial fibrillation (HCC) (Chronic)  On anticoagulant and doing well. Followed by cardiologist       Age-related osteoporosis without current pathological fracture   She did not show for appt at osteoporosis clinic. She was dealing with hip surgery. She will call to schedule appt       Chronic diarrhea   Has appt with GI. Does not seem to be persistent.       Congestive heart failure (CHF) (HCC)   Euvolemic. Followed by cardiology      Diabetes mellitus (HCC) - Primary   Controlled. Last A1c in normal range. Ok to continue metformin .       H/O right nephrectomy   Hyperlipidemia associated with type 2 diabetes mellitus (HCC)   Hypertensive heart disease with heart failure (HCC)   HTN controlled.       Sleep concern   In depth counseling on good sleep hygiene. She may stop mirtazapine  if not helpful.       Stage 3b chronic kidney disease (HCC)   Followed by nephrologist. Single kidney due to hx of renal cancer. She did not get renal US  ordered by nephrologist. She will call to discuss with [facility]       Vitamin D  deficiency   Check vitamin D  level and follow up      Other Visit Diagnoses       Chronic diastolic (congestive) heart failure (HCC)           I have discontinued Niley B. Mcguinn Carole's diclofenac Sodium, icosapent  Ethyl, and oxyCODONE . I am also having her maintain her Propylene Glycol, MAGNESIUM  PO, Turmeric (QC TUMERIC COMPLEX PO), Calcium  Carbonate (CALCIUM  600 PO), b complex vitamins, POTASSIUM PO, lidocaine ,  loperamide, acetaminophen , Blood Glucose Monitoring Suppl, metFORMIN , Accu-Chek Softclix Lancets, Accu-Chek Guide Test, digoxin , Fish Oil, vitamin C , Vitamin D3, fexofenadine, Specialty Vitamins Products (MENOPAUSE SUPPORT PO), tiZANidine , rosuvastatin, lisinopril , mirtazapine , Eliquis , metoprolol  tartrate, nitrofurantoin (macrocrystal-monohydrate), prednisoLONE  acetate, and moxifloxacin.  No orders of the defined types were placed in this encounter.

## 2023-08-30 NOTE — Assessment & Plan Note (Signed)
HTN controlled

## 2023-08-30 NOTE — Assessment & Plan Note (Signed)
Check vitamin D level and follow up 

## 2023-08-30 NOTE — Assessment & Plan Note (Signed)
 In depth counseling on good sleep hygiene. She may stop mirtazapine  if not helpful.

## 2023-08-30 NOTE — Assessment & Plan Note (Signed)
 Controlled. Last A1c in normal range. Ok to continue metformin .

## 2023-08-30 NOTE — Assessment & Plan Note (Signed)
 Has appt with GI. Does not seem to be persistent.

## 2023-08-30 NOTE — Assessment & Plan Note (Signed)
 Followed by nephrologist. Single kidney due to hx of renal cancer. She did not get renal US  ordered by nephrologist. She will call to discuss with [facility]

## 2023-09-04 ENCOUNTER — Encounter: Payer: Self-pay | Admitting: Orthopaedic Surgery

## 2023-09-04 ENCOUNTER — Other Ambulatory Visit (INDEPENDENT_AMBULATORY_CARE_PROVIDER_SITE_OTHER)

## 2023-09-04 ENCOUNTER — Ambulatory Visit (INDEPENDENT_AMBULATORY_CARE_PROVIDER_SITE_OTHER): Admitting: Orthopaedic Surgery

## 2023-09-04 DIAGNOSIS — Z96642 Presence of left artificial hip joint: Secondary | ICD-10-CM

## 2023-09-04 NOTE — Progress Notes (Signed)
 The patient is a 73 year old who is now just under 3 months status post a left total hip replacement to treat significant left hip avascular necrosis.  She does ambulate with a rolling walker.  She said the hip is really doing well.  On exam the left hip moves smoothly and fluidly.  Her right hip also moves smoothly and fluidly.  An AP pelvis today shows a well-seated left total hip arthroplasty with no complicating features.  She knows to be careful walking around given her fall risk.  From our standpoint we will see her back in 6 months with a final AP pelvis at that visit.  If there are issues before then she knows to let us  know.

## 2023-09-14 ENCOUNTER — Telehealth: Payer: Self-pay

## 2023-09-14 NOTE — Telephone Encounter (Signed)
 Patient called stating that she has some swelling in her left hip and thinks it may be fluid.  Stated no pain and no redness. Has an appt.scheduled for Monday, 09/18/2023. Had left hip surgery on 06/23/2023.  CB# [id number].  Please advise.  Thank you.

## 2023-09-14 NOTE — Telephone Encounter (Signed)
 Patient aware this is probably her seroma and we can drain this Monday at her appt

## 2023-09-18 ENCOUNTER — Encounter: Payer: Self-pay | Admitting: Orthopaedic Surgery

## 2023-09-18 ENCOUNTER — Ambulatory Visit (INDEPENDENT_AMBULATORY_CARE_PROVIDER_SITE_OTHER): Admitting: Orthopaedic Surgery

## 2023-09-18 DIAGNOSIS — Z96642 Presence of left artificial hip joint: Secondary | ICD-10-CM

## 2023-09-18 NOTE — Progress Notes (Signed)
 The patient is at 3 months status post a left total hip arthroplasty secondary to AVN.  When we saw her at last visit we did not need to see her for 6 months.  However she had a small fall and has had a reaccumulation of fluid on the lateral aspect of her left hip.  She is on Eliquis .  On exam there is a fluid collection.  I did try to aspirate fluid from this area but it was more of a hematoma than seroma.  I was able to decompress the area fully and then placed a compressive dressing over this.  She is ambulating well.  She said the fall was just accidental and I want her to go slow.  From our standpoint we still need to see her back for 6 months unless there is issues.  At that visit we will have a standing AP pelvis and lateral of her left operative hip.

## 2023-09-21 ENCOUNTER — Encounter: Payer: Self-pay | Admitting: Family Medicine

## 2023-09-26 DIAGNOSIS — I70203 Unspecified atherosclerosis of native arteries of extremities, bilateral legs: Secondary | ICD-10-CM | POA: Diagnosis not present

## 2023-09-26 DIAGNOSIS — M19071 Primary osteoarthritis, right ankle and foot: Secondary | ICD-10-CM | POA: Diagnosis not present

## 2023-09-26 DIAGNOSIS — M19072 Primary osteoarthritis, left ankle and foot: Secondary | ICD-10-CM | POA: Diagnosis not present

## 2023-09-26 DIAGNOSIS — G629 Polyneuropathy, unspecified: Secondary | ICD-10-CM | POA: Diagnosis not present

## 2023-09-26 DIAGNOSIS — M21961 Unspecified acquired deformity of right lower leg: Secondary | ICD-10-CM | POA: Diagnosis not present

## 2023-09-26 DIAGNOSIS — M21962 Unspecified acquired deformity of left lower leg: Secondary | ICD-10-CM | POA: Diagnosis not present

## 2023-10-22 NOTE — Progress Notes (Deleted)
 Nicole [name] [id number] 06-21-1950   Chief Complaint:  Referring Provider: Lendia Boby CROME, NP-C Primary GI MD:   HPI: Nicole [name] is a 73 y.o. female with past medical history of anemia, asthma, A-fib, renal cancer, CHF, alcoholism, CKD, depression, diabetes, GERD, hiatal hernia, HTN, alcoholic liver cirrhosis, seizures who presents today for a complaint of *** .    Had EGD by Dr. Dianna inpatient 12/2016 which showed a large infiltrative, circumferential mass without bleeding in the gastric body that was causing gastric outlet obstruction. The scope was unable to pass the mass. Mucosal biopsies were taken (unclear if biopsies were of the mass) which showed chronic gastritis.   OSH CT on 11/10 showed homogeneous mass within stomach that extends from hiatal hernia to the mid stomach, which appears mural in location, measuring 11.4 x 7.1 x 8.2 cm; heterogeneous area in liver that is 4.7 x 4.5 x 3.4 cm; pancreatic body and tail cyst that is 6.4 x 4.1 x 5.3 cm; 5.8 cm R renal mass c/w RCC; cirrhotic changes of liver; small amount of ascites.    She was started on TPN for nutrition. Unfortunately, hospital course c/b Afib with RVR and was placed on amiodarone  gtt. Further complicated by aspiration requiring intubation and was started on vanc/Zosyn .  She was transferred to [facility] with plan for EUS/FNA once respiratory status improved and A-fib with RVR controlled.  EUS 11/19 with a 2.6 cm X 3.1 cm cyst was noted adjacent to the gastric body. FNA was performed and fluid sent for cytology, chemistry and culture.   Her symptoms appear to be largely due to a hiatal hernia.  Cystic lesion not amenable to endoscopic therapy.  Seen by [facility] as inpatient in 2021. History of atrial fibrillation GERD with known large hiatal hernia chronic alcohol  abuse history of renal cell carcinoma status post right nephrectomy in August 2019, seizure disorder, history of  pancreatic pseudocyst presenting as a gastric mass on EGD (EUS in November 2018 at [facility], fluid CEA 71 and amylase 3345)   Seen by [facility] 03/17/2023?    Previous GI Procedures/Imaging   EGD 12/18/2016 - Normal upper third of esophagus and middle third of esophagus. - Likely malignant gastric tumor in the gastric body.  - No specimens collected.  Past Medical History:  Diagnosis Date   Anemia    Arthritis    Asthma    as a child   Atrial fibrillation with RVR (HCC)    Cancer (HCC)    right kidney   CHF (congestive heart failure) (HCC)    Chronic alcoholism (HCC)    Chronic kidney disease    has solitary kidney ( right nephrectomy for cancer)   Chronic left shoulder pain    Depression    Diabetes mellitus without complication (HCC)    Dysrhythmia    Atrial Fib with RVR    GERD (gastroesophageal reflux disease)    Heartburn    History of blood transfusion    History of hiatal hernia    Hypertension    Liver cirrhosis, alcoholic (HCC)    Memory loss    Schizophrenia (HCC)    patient denies having   Seizures (HCC)    last 2024, no meds and she refuses to see neurology   Thyroid  nodule     Past Surgical History:  Procedure Laterality Date   ANKLE FRACTURE SURGERY Right 2008   screws   ANKLE FRACTURE SURGERY Left 2008   plates  ESOPHAGOGASTRODUODENOSCOPY (EGD) WITH PROPOFOL  N/A 12/18/2016   Procedure: ESOPHAGOGASTRODUODENOSCOPY (EGD) WITH PROPOFOL ;  Surgeon: Dianna Specking, MD;  Location: [facility] ENDOSCOPY;  Service: Endoscopy;  Laterality: N/A;   JOINT REPLACEMENT Left    total knee arthroplasty done at [facility], can't remember when it was   NASAL SINUS SURGERY Left 11/17/2017   Procedure: ENDOSCOPIC SINUS approach;  Surgeon: Carlie Clark, MD;  Location: [facility] OR;  Service: ENT;  Laterality: Left;   NEPHRECTOMY Right    robotic right radical  for cancer.   ORIF ORBITAL FRACTURE Left 11/17/2017   Procedure: Trans Antral Left sided orbital floor fracture repair with  dissovable floor implant;  Surgeon: Carlie Clark, MD;  Location: [facility] OR;  Service: ENT;  Laterality: Left;   TONSILLECTOMY     TOTAL HIP ARTHROPLASTY Left 06/23/2023   Procedure: ARTHROPLASTY, HIP, TOTAL, ANTERIOR APPROACH;  Surgeon: Vernetta Lonni GRADE, MD;  Location: [facility] ORS;  Service: Orthopedics;  Laterality: Left;    Current Outpatient Medications  Medication Sig Dispense Refill   Accu-Chek Softclix Lancets lancets USE ONE LANCET IN THE MORNING, AT NOON, AND AT BEDTIME 300 each 3   acetaminophen  (TYLENOL ) 500 MG tablet Take 500 mg by mouth 2 (two) times daily.     Ascorbic Acid  (VITAMIN C ) 1000 MG tablet Take 1,000 mg by mouth in the morning.     b complex vitamins capsule Take 1 capsule by mouth daily.     Blood Glucose Monitoring Suppl DEVI 1 each by Does not apply route in the morning, at noon, and at bedtime. May substitute to any manufacturer covered by patient's insurance. 1 each 0   Calcium  Carbonate (CALCIUM  600 PO) Take 600 mg by mouth in the morning and at bedtime.     Cholecalciferol (VITAMIN D3) 125 MCG (5000 UT) TABS Take 5,000 Units by mouth daily.     digoxin  (LANOXIN ) 0.125 MG tablet Take 0.5 tablets (0.0625 mg total) by mouth daily. 45 tablet 3   ELIQUIS  5 MG TABS tablet TAKE 1 TABLET(5 MG) BY MOUTH TWICE DAILY 180 tablet 0   fexofenadine (ALLEGRA) 180 MG tablet Take 180 mg by mouth daily as needed for allergies or rhinitis.     glucose blood (ACCU-CHEK GUIDE TEST) test strip USE ONE STRIP IN THE MORNING, AT NOON, AND AT BEDTIME 300 strip 3   lidocaine  4 % Place 1 patch onto the skin as needed (pain.).     lisinopril  (ZESTRIL ) 2.5 MG tablet TAKE 1 TABLET BY MOUTH EVERY DAY 90 tablet 1   loperamide (IMODIUM) 2 MG capsule Take 2-4 mg by mouth 4 (four) times daily as needed for diarrhea or loose stools.     MAGNESIUM  PO Take 500 mg by mouth in the morning and at bedtime.     metFORMIN  (GLUCOPHAGE ) 500 MG tablet TAKE 2 TABLETS BY MOUTH WITH BREAKFAST AND 1 TABLET WITH  EVENING MEAL 270 tablet 1   metoprolol  tartrate (LOPRESSOR ) 50 MG tablet TAKE 1 TABLET BY MOUTH TWICE A DAY 180 tablet 1   mirtazapine  (REMERON ) 15 MG tablet TAKE 1 TABLET (15 MG TOTAL) BY MOUTH AT BEDTIME. TAKE 1 TABLET(15 MG) BY MOUTH AT BEDTIME 90 tablet 0   moxifloxacin (VIGAMOX) 0.5 % ophthalmic solution Place 1 drop into the right eye 4 (four) times daily.     nitrofurantoin, macrocrystal-monohydrate, (MACROBID) 100 MG capsule Take 100 mg by mouth 2 (two) times daily.     Omega-3 Fatty Acids (FISH OIL) 1200 MG CAPS Take 1,200 mg by mouth in the  morning, at noon, and at bedtime.     POTASSIUM PO Take 595 mg by mouth in the morning and at bedtime.     prednisoLONE  acetate (PRED FORTE ) 1 % ophthalmic suspension Place 1 drop into the right eye 4 (four) times daily.     Propylene Glycol (SYSTANE COMPLETE) 0.6 % SOLN Place 1 drop into both eyes See admin instructions. Instill one drop into both eyes scheduled twice daily, may use as needed for dry eyes     rosuvastatin (CRESTOR) 5 MG tablet TAKE 1 TABLET BY MOUTH EVERY DAY 90 tablet 1   Specialty Vitamins Products (MENOPAUSE SUPPORT PO) Take 1 tablet by mouth daily.  Gynovite Plus - Menopause Supplement for Women     tiZANidine  (ZANAFLEX ) 4 MG tablet Take 1 tablet (4 mg total) by mouth every 8 (eight) hours as needed for muscle spasms. 30 tablet 1   Turmeric (QC TUMERIC COMPLEX PO) Take 1,000 mg by mouth in the morning and at bedtime.     No current facility-administered medications for this visit.    Allergies as of 10/23/2023 - Review Complete 09/18/2023  Allergen Reaction Noted   Other Shortness Of Breath and Anxiety 12/16/2016   Iodinated contrast media Swelling 12/16/2016    Family History  Problem Relation Age of Onset   Stroke Mother    Heart attack Father    Ulcers Father    Diabetes Sister    Cancer Brother    Diabetes Sister    Diabetes Sister     Social History   Tobacco Use   Smoking status: Former    Current  packs/day: 0.00    Average packs/day: 1 pack/day for 15.0 years (15.0 ttl pk-yrs)    Types: Cigarettes    Start date: 11/13/1992    Quit date: 11/14/2007    Years since quitting: 15.9   Smokeless tobacco: Never  Vaping Use   Vaping status: Never Used  Substance Use Topics   Alcohol  use: Not Currently    Comment: none since fall 11/13/17   Drug use: No     Review of Systems:    Constitutional: No weight loss, fever, chills, weakness or fatigue Eyes: No change in vision Ears, Nose, Throat:  No change in hearing or congestion Skin: No rash or itching Cardiovascular: No chest pain, chest pressure or palpitations   Respiratory: No SOB or cough Gastrointestinal: See HPI and otherwise negative Genitourinary: No dysuria or change in urinary frequency Neurological: No headache, dizziness or syncope Musculoskeletal: No new muscle or joint pain Hematologic: No bleeding or bruising    Physical Exam:  Vital signs: There were no vitals taken for this visit.  Constitutional: NAD, Well developed, Well nourished, alert and cooperative Head:  Normocephalic and atraumatic.  Eyes: No scleral icterus. Conjunctiva pink. Mouth: No oral lesions. Respiratory: Respirations even and unlabored. Lungs clear to auscultation bilaterally.  No wheezes, crackles, or rhonchi.  Cardiovascular:  Regular rate and rhythm. No murmurs. No peripheral edema. Gastrointestinal:  Soft, nondistended, nontender. No rebound or guarding. Normal bowel sounds. No appreciable masses or hepatomegaly. Rectal:  Not performed.  Neurologic:  Alert and oriented x4;  grossly normal neurologically.  Skin:   Dry and intact without significant lesions or rashes. Psychiatric: Oriented to person, place and time. Demonstrates good judgement and reason without abnormal affect or behaviors.   RELEVANT LABS AND IMAGING: CBC    Component Value Date/Time   WBC 10.9 (H) 06/24/2023 0321   RBC 3.26 (L) 06/24/2023 0321  HGB 9.8 (L)  06/24/2023 0321   HCT 32.5 (L) 06/24/2023 0321   PLT 137 (L) 06/24/2023 0321   MCV 99.7 06/24/2023 0321   MCH 30.1 06/24/2023 0321   MCHC 30.2 06/24/2023 0321   RDW 13.3 06/24/2023 0321   LYMPHSABS 0.9 02/22/2023 1557   MONOABS 0.5 02/22/2023 1557   EOSABS 0.3 02/22/2023 1557   BASOSABS 0.1 02/22/2023 1557    CMP     Component Value Date/Time   NA 131 (L) 06/24/2023 0321   NA 134 05/04/2023 0939   K 6.2 (H) 06/24/2023 0321   CL 102 06/24/2023 0321   CO2 22 06/24/2023 0321   GLUCOSE 125 (H) 06/24/2023 0321   BUN 23 06/24/2023 0321   BUN 35 (H) 05/04/2023 0939   CREATININE 0.91 06/24/2023 0321   CALCIUM  8.5 (L) 06/24/2023 0321   PROT 6.7 06/20/2023 1438   PROT 6.2 05/04/2023 0939   ALBUMIN  4.0 06/20/2023 1438   ALBUMIN  4.4 05/04/2023 0939   AST 23 06/20/2023 1438   ALT 27 06/20/2023 1438   ALKPHOS 57 06/20/2023 1438   BILITOT 0.6 06/20/2023 1438   BILITOT 0.3 05/04/2023 0939   GFRNONAA >60 06/24/2023 0321   GFRAA >60 11/09/2019 0712   Echocardiogram 12/23/2016 - Left ventricle: The cavity size was normal. Wall thickness was    normal. Systolic function was normal. The estimated ejection    fraction was in the range of 55% to 60%. Wall motion was normal;    there were no regional wall motion abnormalities.   Assessment/Plan:       Camie Furbish, PA-C [facility] 10/22/2023, 9:00 PM  Patient Care Team: Lendia Boby CROME, NP-C as PCP - General (Family Medicine) Mallipeddi, Diannah SQUIBB, MD as PCP - Cardiology (Cardiology) Fleeta Zerita DASEN, MD as Consulting Physician (Ophthalmology) Vernetta Berg, MD as Consulting Physician (General Surgery)

## 2023-10-23 ENCOUNTER — Other Ambulatory Visit: Payer: Self-pay | Admitting: Family Medicine

## 2023-10-23 ENCOUNTER — Ambulatory Visit: Admitting: Gastroenterology

## 2023-10-23 DIAGNOSIS — E1169 Type 2 diabetes mellitus with other specified complication: Secondary | ICD-10-CM

## 2023-11-16 ENCOUNTER — Encounter: Payer: Self-pay | Admitting: Family Medicine

## 2023-11-16 NOTE — Progress Notes (Signed)
 Pharmacy Quality Measure Review  This patient is appearing on a report for being at risk of failing the adherence measure for hypertension (ACEi/ARB) medications this calendar year.   Medication: Lisinopril  2.5mg  Last fill date: 09/04 for 90 day supply  Insurance report was not up to date. No action needed at this time.   Angela Baalmann, PharmD [facility] Pharmacist

## 2023-11-17 DIAGNOSIS — N1832 Chronic kidney disease, stage 3b: Secondary | ICD-10-CM | POA: Diagnosis not present

## 2023-11-21 DIAGNOSIS — I129 Hypertensive chronic kidney disease with stage 1 through stage 4 chronic kidney disease, or unspecified chronic kidney disease: Secondary | ICD-10-CM | POA: Diagnosis not present

## 2023-11-21 DIAGNOSIS — D631 Anemia in chronic kidney disease: Secondary | ICD-10-CM | POA: Diagnosis not present

## 2023-11-21 DIAGNOSIS — N1831 Chronic kidney disease, stage 3a: Secondary | ICD-10-CM | POA: Diagnosis not present

## 2023-11-21 DIAGNOSIS — E559 Vitamin D deficiency, unspecified: Secondary | ICD-10-CM | POA: Diagnosis not present

## 2023-12-07 ENCOUNTER — Encounter: Payer: Self-pay | Admitting: *Deleted

## 2023-12-07 ENCOUNTER — Other Ambulatory Visit: Payer: Self-pay | Admitting: Family Medicine

## 2023-12-07 NOTE — Progress Notes (Signed)
 Nicole [name]                                          MRN: [id number]   12/07/2023   The [facility] Quality Team Specialist reviewed this patient medical record for the purposes of chart review for care gap closure. The following were reviewed: abstraction for care gap closure-glycemic status assessment.    [facility] Quality Team

## 2023-12-07 NOTE — Progress Notes (Signed)
 Nicole [name]                                          MRN: [id number]   12/07/2023   The [facility] Quality Team Specialist reviewed this patient medical record for the purposes of chart review for care gap closure. The following were reviewed: chart review for care gap closure-kidney health evaluation for diabetes:eGFR  and uACR.    [facility]I Quality Team

## 2023-12-18 ENCOUNTER — Other Ambulatory Visit: Payer: Self-pay | Admitting: Family Medicine

## 2023-12-18 ENCOUNTER — Encounter: Payer: Self-pay | Admitting: Radiology

## 2023-12-18 DIAGNOSIS — E1169 Type 2 diabetes mellitus with other specified complication: Secondary | ICD-10-CM

## 2023-12-18 NOTE — Telephone Encounter (Unsigned)
 Copied from CRM [id number]. Topic: Clinical - Medication Refill >> Dec 18, 2023  9:53 AM Revonda D wrote: Medication: glucose blood (ACCU-CHEK GUIDE TEST) test strip  Has the patient contacted their pharmacy? Yes (Agent: If no, request that the patient contact the pharmacy for the refill. If patient does not wish to contact the pharmacy document the reason why and proceed with request.) (Agent: If yes, when and what did the pharmacy advise?)  This is the patient's preferred pharmacy:  [id number] - [place], [place] - [address] [place] [address] Phone: [phone] Fax: [phone]  Is this the correct pharmacy for this prescription? Yes If no, delete pharmacy and type the correct one.   Has the prescription been filled recently? No  Is the patient out of the medication? No, almost out   Has the patient been seen for an appointment in the last year OR does the patient have an upcoming appointment? Yes  Can we respond through MyChart? No  Agent: Please be advised that Rx refills may take up to 3 business days. We ask that you follow-up with your pharmacy.

## 2023-12-21 ENCOUNTER — Ambulatory Visit: Attending: Internal Medicine | Admitting: Internal Medicine

## 2023-12-21 ENCOUNTER — Encounter: Payer: Self-pay | Admitting: Internal Medicine

## 2023-12-21 VITALS — BP 134/82 | HR 109 | Ht 67.0 in | Wt 163.0 lb

## 2023-12-21 DIAGNOSIS — I4892 Unspecified atrial flutter: Secondary | ICD-10-CM | POA: Insufficient documentation

## 2023-12-21 DIAGNOSIS — Z7901 Long term (current) use of anticoagulants: Secondary | ICD-10-CM | POA: Insufficient documentation

## 2023-12-21 DIAGNOSIS — I10 Essential (primary) hypertension: Secondary | ICD-10-CM | POA: Diagnosis not present

## 2023-12-21 DIAGNOSIS — Z136 Encounter for screening for cardiovascular disorders: Secondary | ICD-10-CM

## 2023-12-21 MED ORDER — DIGOXIN 125 MCG PO TABS: 0.0625 mg | ORAL_TABLET | Freq: Every day | ORAL | 0 refills | Status: DC

## 2023-12-21 NOTE — Patient Instructions (Addendum)
 Medication Instructions:  Your physician has recommended you make the following change in your medication:  Start Digoxin  0.0625 mg once daily Continue taking all other medications as prescribed   Labwork: BMET in on week at [facility]   Testing/Procedures: Your physician has requested that you have an echocardiogram. Echocardiography is a painless test that uses sound waves to create images of your heart. It provides your doctor with information about the size and shape of your heart and how well your heart's chambers and valves are working. This procedure takes approximately one hour. There are no restrictions for this procedure. Please do NOT wear cologne, perfume, aftershave, or lotions (deodorant is allowed). Please arrive 15 minutes prior to your appointment time.  Please note: We ask at that you not bring children with you during ultrasound (echo/ vascular) testing. Due to room size and safety concerns, children are not allowed in the ultrasound rooms during exams. Our front office staff cannot provide observation of children in our lobby area while testing is being conducted. An adult accompanying a patient to their appointment will only be allowed in the ultrasound room at the discretion of the ultrasound technician under special circumstances. We apologize for any inconvenience.   Follow-Up: Your physician recommends that you schedule a follow-up appointment in: Nurse visit 1 week/ 6 months f/u  Any Other Special Instructions Will Be Listed Below (If Applicable). Thank you for choosing [facility]!     If you need a refill on your cardiac medications before your next appointment, please call your pharmacy.

## 2023-12-21 NOTE — Progress Notes (Signed)
 Cardiology Office Note  Date: 12/21/2023   ID: Nicole [name], DOB [birth year], MRN [id number]  PCP:  Lendia Boby CROME, NP-C  Cardiologist:  Diannah SHAUNNA Maywood, MD Electrophysiologist:  None   History of Present Illness: Nicole [name] is a 73 y.o. female known to have permanent A-fib on [facility], alcoholic liver cirrhosis, HTN is here for follow-up visit.  I reviewed her records from Care Everywhere.  She was diagnosed with atrial fibrillation and admitted with A-fib with RVR in 2021 at [facility].  She has been on BB, digoxin  and AC since then.  Never underwent any DCCV.  patient is here today for follow-up visit.  EKG today showed atypical atrial flutter with RVR, HR 109 bpm.  She stopped taking digoxin .  She was previously on 0.125 mg that was decreased to 0.0625 mg which kept her heart rates under control.  She does not have any symptoms except for shortness of breath with overexertion.  No SOB with daily activities and chores.  No angina, dizziness, syncope, palpitations, leg swelling. Echocardiogram in 2021 showed low normal LVEF 50 to 50%, mildly reduced RV systolic function with mildly dilated RV size, severe biatrial enlargement.  She quit alcohol  and smoking many years ago.  Past Medical History:  Diagnosis Date   Anemia    Arthritis    Asthma    as a child   Atrial fibrillation with RVR (HCC)    Cancer (HCC)    right kidney   CHF (congestive heart failure) (HCC)    Chronic alcoholism (HCC)    Chronic kidney disease    has solitary kidney ( right nephrectomy for cancer)   Chronic left shoulder pain    Depression    Diabetes mellitus without complication (HCC)    Dysrhythmia    Atrial Fib with RVR    GERD (gastroesophageal reflux disease)    Heartburn    History of blood transfusion    History of hiatal hernia    Hypertension    Liver cirrhosis, alcoholic (HCC)    Memory loss    Schizophrenia (HCC)    patient denies having   Seizures (HCC)    last 2024, no meds and  she refuses to see neurology   Thyroid  nodule     Past Surgical History:  Procedure Laterality Date   ANKLE FRACTURE SURGERY Right 2008   screws   ANKLE FRACTURE SURGERY Left 2008   plates   ESOPHAGOGASTRODUODENOSCOPY (EGD) WITH PROPOFOL  N/A 12/18/2016   Procedure: ESOPHAGOGASTRODUODENOSCOPY (EGD) WITH PROPOFOL ;  Surgeon: Dianna Specking, MD;  Location: [facility] ENDOSCOPY;  Service: Endoscopy;  Laterality: N/A;   JOINT REPLACEMENT Left    total knee arthroplasty done at [facility], can't remember when it was   NASAL SINUS SURGERY Left 11/17/2017   Procedure: ENDOSCOPIC SINUS approach;  Surgeon: Carlie Clark, MD;  Location: [facility] OR;  Service: ENT;  Laterality: Left;   NEPHRECTOMY Right    robotic right radical  for cancer.   ORIF ORBITAL FRACTURE Left 11/17/2017   Procedure: Trans Antral Left sided orbital floor fracture repair with dissovable floor implant;  Surgeon: Carlie Clark, MD;  Location: [facility] OR;  Service: ENT;  Laterality: Left;   TONSILLECTOMY     TOTAL HIP ARTHROPLASTY Left 06/23/2023   Procedure: ARTHROPLASTY, HIP, TOTAL, ANTERIOR APPROACH;  Surgeon: Vernetta Lonni GRADE, MD;  Location: [facility] ORS;  Service: Orthopedics;  Laterality: Left;    Current Outpatient Medications  Medication Sig Dispense Refill   Accu-Chek Softclix Lancets lancets USE ONE  LANCET IN THE MORNING, AT NOON, AND AT BEDTIME 300 each 3   acetaminophen  (TYLENOL ) 500 MG tablet Take 500 mg by mouth 2 (two) times daily.     apixaban  (ELIQUIS ) 5 MG TABS tablet TAKE 1 TABLET BY MOUTH TWICE A DAY 180 tablet 1   Ascorbic Acid  (VITAMIN C ) 1000 MG tablet Take 1,000 mg by mouth in the morning.     b complex vitamins capsule Take 1 capsule by mouth daily.     Blood Glucose Monitoring Suppl DEVI 1 each by Does not apply route in the morning, at noon, and at bedtime. May substitute to any manufacturer covered by patient's insurance. 1 each 0   Calcium  Carbonate (CALCIUM  600 PO) Take 600 mg by mouth in the morning and at  bedtime.     Cholecalciferol (VITAMIN D3) 125 MCG (5000 UT) TABS Take 5,000 Units by mouth daily.     digoxin  (LANOXIN ) 0.125 MG tablet Take 0.5 tablets (0.0625 mg total) by mouth daily. 45 tablet 3   fexofenadine (ALLEGRA) 180 MG tablet Take 180 mg by mouth daily as needed for allergies or rhinitis.     glucose blood (ACCU-CHEK GUIDE TEST) test strip USE ONE STRIP IN THE MORNING, AT NOON, AND AT BEDTIME 300 strip 3   lidocaine  4 % Place 1 patch onto the skin as needed (pain.).     lisinopril  (ZESTRIL ) 2.5 MG tablet TAKE 1 TABLET BY MOUTH EVERY DAY 90 tablet 1   loperamide (IMODIUM) 2 MG capsule Take 2-4 mg by mouth 4 (four) times daily as needed for diarrhea or loose stools.     MAGNESIUM  PO Take 500 mg by mouth in the morning and at bedtime.     metFORMIN  (GLUCOPHAGE ) 500 MG tablet TAKE 2 TABLETS BY MOUTH AT BREAKFAST AND 1 TABLET EVERY EVENING WITH MEAL DAILY 270 tablet 1   metoprolol  tartrate (LOPRESSOR ) 50 MG tablet TAKE 1 TABLET BY MOUTH TWICE A DAY 180 tablet 1   mirtazapine  (REMERON ) 15 MG tablet TAKE 1 TABLET (15 MG TOTAL) BY MOUTH AT BEDTIME. TAKE 1 TABLET(15 MG) BY MOUTH AT BEDTIME 90 tablet 1   moxifloxacin (VIGAMOX) 0.5 % ophthalmic solution Place 1 drop into the right eye 4 (four) times daily.     nitrofurantoin, macrocrystal-monohydrate, (MACROBID) 100 MG capsule Take 100 mg by mouth 2 (two) times daily.     Omega-3 Fatty Acids (FISH OIL) 1200 MG CAPS Take 1,200 mg by mouth in the morning, at noon, and at bedtime.     POTASSIUM PO Take 595 mg by mouth in the morning and at bedtime.     prednisoLONE  acetate (PRED FORTE ) 1 % ophthalmic suspension Place 1 drop into the right eye 4 (four) times daily.     Propylene Glycol (SYSTANE COMPLETE) 0.6 % SOLN Place 1 drop into both eyes See admin instructions. Instill one drop into both eyes scheduled twice daily, may use as needed for dry eyes     rosuvastatin (CRESTOR) 5 MG tablet TAKE 1 TABLET BY MOUTH EVERY DAY 90 tablet 1   Specialty  Vitamins Products (MENOPAUSE SUPPORT PO) Take 1 tablet by mouth daily.  Gynovite Plus - Menopause Supplement for Women     tiZANidine  (ZANAFLEX ) 4 MG tablet Take 1 tablet (4 mg total) by mouth every 8 (eight) hours as needed for muscle spasms. 30 tablet 1   Turmeric (QC TUMERIC COMPLEX PO) Take 1,000 mg by mouth in the morning and at bedtime.     No current facility-administered  medications for this visit.   Allergies:  Other and Iodinated contrast media   Social History: The patient  reports that she quit smoking about 16 years ago. Her smoking use included cigarettes. She started smoking about 31 years ago. She has a 15 pack-year smoking history. She has never used smokeless tobacco. She reports that she does not currently use alcohol . She reports that she does not use drugs.   Family History: The patient's family history includes Cancer in her brother; Diabetes in her sister, sister, and sister; Heart attack in her father; Stroke in her mother; Ulcers in her father.   ROS:  Please see the history of present illness. Otherwise, complete review of systems is positive for none  All other systems are reviewed and negative.   Physical Exam: VS:  There were no vitals taken for this visit., BMI There is no height or weight on file to calculate BMI.  Wt Readings from Last 3 Encounters:  08/30/23 150 lb (68 kg)  08/16/23 150 lb (68 kg)  06/23/23 150 lb (68 kg)    General: Patient appears comfortable at rest. HEENT: Conjunctiva and lids normal, oropharynx clear with moist mucosa. Neck: Supple, no elevated JVP or carotid bruits, no thyromegaly. Lungs: Clear to auscultation, nonlabored breathing at rest. Cardiac: Iregular rate and rhythm, no S3 or significant systolic murmur, no pericardial rub. Abdomen: Soft, nontender, no hepatomegaly, bowel sounds present, no guarding or rebound. Extremities: No pitting edema, distal pulses 2+. Skin: Warm and dry. Musculoskeletal: No  kyphosis. Neuropsychiatric: Alert and oriented x3, affect grossly appropriate.  Recent Labwork: 06/20/2023: ALT 27; AST 23 06/24/2023: BUN 23; Creatinine, Ser 0.91; Hemoglobin 9.8; Platelets 137; Potassium 6.2; Sodium 131     Component Value Date/Time   CHOL 125 05/04/2023 0939   TRIG 296 (H) 05/04/2023 0939   HDL 29 (L) 05/04/2023 0939   CHOLHDL 4.3 05/04/2023 0939   CHOLHDL 3 10/20/2022 1603   VLDL 66.8 (H) 10/20/2022 1603   LDLCALC 50 05/04/2023 0939   LDLDIRECT 86.0 06/08/2022 1627     Assessment and Plan:   Permanent atrial fibrillation/flutter with RVR: New diagnosis of A-fib in 2021 at [facility].  EKGs from 2022 and 2025 showed rate controlled atypical atrial flutter.  Asymptomatic.  Never underwent DCCV.  Echo from 2021 showed severe biatrial enlargement.  EKG today showed atypical atrial flutter with RVR, HR 109 bpm.  Tachycardic on auscultation.  Continues to be asymptomatic.  She stopped taking digoxin .  Will resume at a lower dose, 0.0625 mg once daily.  Continue metoprolol  tartrate 50 mg twice daily.  Continue Eliquis  5 mg twice daily.  Obtain BMP in 5 days and EKG in 1 week.  Repeat echocardiogram.  Due to severe biatrial enlargement in 2021, unlikely she will stay in rhythm despite DCCV.  Hypertriglyceridemia: TG 296 (significantly improved), LDL 50 in March 2025.  Continue fish oil supplements and rosuvastatin 5 mg nightly.  Alcoholic liver cirrhosis: Quit alcohol .  She was to be on lactulose  and recently on rifaximin  but not it anymore.  Follow-up with PCP/GI.  HTN, controlled: Continue lisinopril  2.5 mg once daily, metoprolol  as stated above.  30 minutes spent in reviewing prior medical records, specialist notes, more than 3 labs, discussion and documentation.  Medication Adjustments/Labs and Tests Ordered: Current medicines are reviewed at length with the patient today.  Concerns regarding medicines are outlined above.    Disposition:  Follow up 6  months  Signed Andris Brothers Priya Rande Roylance, MD, 12/21/2023 11:15 AM  [facility] at [facility] [address] [place], [place], [place] [address]

## 2023-12-22 ENCOUNTER — Other Ambulatory Visit: Payer: Self-pay | Admitting: Family Medicine

## 2023-12-22 DIAGNOSIS — E1169 Type 2 diabetes mellitus with other specified complication: Secondary | ICD-10-CM

## 2023-12-22 MED ORDER — ACCU-CHEK GUIDE TEST VI STRP: ORAL_STRIP | 3 refills | Status: DC

## 2023-12-29 ENCOUNTER — Ambulatory Visit: Payer: Self-pay | Admitting: Internal Medicine

## 2023-12-29 ENCOUNTER — Ambulatory Visit: Attending: Cardiology | Admitting: *Deleted

## 2023-12-29 DIAGNOSIS — I4892 Unspecified atrial flutter: Secondary | ICD-10-CM

## 2023-12-29 LAB — BASIC METABOLIC PANEL WITH GFR
BUN/Creatinine Ratio: 19 (ref 12–28)
BUN: 21 mg/dL (ref 8–27)
CO2: 20 mmol/L (ref 20–29)
Calcium: 9.4 mg/dL (ref 8.7–10.3)
Chloride: 102 mmol/L (ref 96–106)
Creatinine, Ser: 1.09 mg/dL — ABNORMAL HIGH (ref 0.57–1.00)
Glucose: 90 mg/dL (ref 70–99)
Potassium: 5.4 mmol/L — ABNORMAL HIGH (ref 3.5–5.2)
Sodium: 135 mmol/L (ref 134–144)
eGFR: 53 mL/min/1.73 — ABNORMAL LOW (ref >59)

## 2023-12-29 NOTE — Progress Notes (Signed)
 Patient in office this evening for nurse EKG visit.   See scanned into epic

## 2024-01-04 ENCOUNTER — Ambulatory Visit

## 2024-01-08 ENCOUNTER — Telehealth: Payer: Self-pay | Admitting: Internal Medicine

## 2024-01-08 ENCOUNTER — Ambulatory Visit: Payer: Self-pay

## 2024-01-08 NOTE — Progress Notes (Unsigned)
 Subjective:    Patient ID: Nicole [name] Single, female    DOB: [birth year], 73 y.o.   MRN: [id number]      HPI [place] is here for No chief complaint on file.   Has a seizure disorder.  She is not on medication.     Medications and allergies reviewed with patient and updated if appropriate.  Current Outpatient Medications on File Prior to Visit  Medication Sig Dispense Refill   Accu-Chek Softclix Lancets lancets USE ONE LANCET IN THE MORNING, AT NOON, AND AT BEDTIME 300 each 3   acetaminophen  (TYLENOL ) 500 MG tablet Take 500 mg by mouth 2 (two) times daily.     apixaban  (ELIQUIS ) 5 MG TABS tablet TAKE 1 TABLET BY MOUTH TWICE A DAY 180 tablet 1   Ascorbic Acid  (VITAMIN C ) 1000 MG tablet Take 1,000 mg by mouth in the morning.     b complex vitamins capsule Take 1 capsule by mouth daily.     Blood Glucose Monitoring Suppl DEVI 1 each by Does not apply route in the morning, at noon, and at bedtime. May substitute to any manufacturer covered by patient's insurance. 1 each 0   Calcium  Carbonate (CALCIUM  600 PO) Take 600 mg by mouth in the morning and at bedtime.     Cholecalciferol (VITAMIN D3) 125 MCG (5000 UT) TABS Take 5,000 Units by mouth daily.     digoxin  (LANOXIN ) 0.125 MG tablet Take 0.5 tablets (0.0625 mg total) by mouth daily. 45 tablet 0   fexofenadine (ALLEGRA) 180 MG tablet Take 180 mg by mouth daily as needed for allergies or rhinitis.     glucose blood (ACCU-CHEK GUIDE TEST) test strip Use to check blood sugars 2x daily 300 strip 3   lidocaine  4 % Place 1 patch onto the skin as needed (pain.).     lisinopril  (ZESTRIL ) 2.5 MG tablet TAKE 1 TABLET BY MOUTH EVERY DAY 90 tablet 1   loperamide (IMODIUM) 2 MG capsule Take 2-4 mg by mouth 4 (four) times daily as needed for diarrhea or loose stools.     MAGNESIUM  PO Take 500 mg by mouth in the morning and at bedtime.     metFORMIN  (GLUCOPHAGE ) 500 MG tablet TAKE 2 TABLETS BY MOUTH AT BREAKFAST AND 1 TABLET EVERY EVENING WITH MEAL  DAILY 270 tablet 1   metoprolol  tartrate (LOPRESSOR ) 50 MG tablet TAKE 1 TABLET BY MOUTH TWICE A DAY 180 tablet 1   mirtazapine  (REMERON ) 15 MG tablet TAKE 1 TABLET (15 MG TOTAL) BY MOUTH AT BEDTIME. TAKE 1 TABLET(15 MG) BY MOUTH AT BEDTIME 90 tablet 1   moxifloxacin (VIGAMOX) 0.5 % ophthalmic solution Place 1 drop into the right eye 4 (four) times daily.     nitrofurantoin, macrocrystal-monohydrate, (MACROBID) 100 MG capsule Take 100 mg by mouth 2 (two) times daily.     Omega-3 Fatty Acids (FISH OIL) 1200 MG CAPS Take 1,200 mg by mouth in the morning, at noon, and at bedtime.     POTASSIUM PO Take 595 mg by mouth in the morning and at bedtime.     prednisoLONE  acetate (PRED FORTE ) 1 % ophthalmic suspension Place 1 drop into the right eye 4 (four) times daily.     Propylene Glycol (SYSTANE COMPLETE) 0.6 % SOLN Place 1 drop into both eyes See admin instructions. Instill one drop into both eyes scheduled twice daily, may use as needed for dry eyes     rosuvastatin (CRESTOR) 5 MG tablet TAKE 1 TABLET BY MOUTH  EVERY DAY 90 tablet 1   Specialty Vitamins Products (MENOPAUSE SUPPORT PO) Take 1 tablet by mouth daily.  Gynovite Plus - Menopause Supplement for Women     tiZANidine  (ZANAFLEX ) 4 MG tablet Take 1 tablet (4 mg total) by mouth every 8 (eight) hours as needed for muscle spasms. 30 tablet 1   Turmeric (QC TUMERIC COMPLEX PO) Take 1,000 mg by mouth in the morning and at bedtime.     No current facility-administered medications on file prior to visit.    Review of Systems     Objective:  There were no vitals filed for this visit. BP Readings from Last 3 Encounters:  12/21/23 134/82  08/30/23 132/84  06/27/23 129/76   Wt Readings from Last 3 Encounters:  12/21/23 163 lb (73.9 kg)  08/30/23 150 lb (68 kg)  08/16/23 150 lb (68 kg)   There is no height or weight on file to calculate BMI.    Physical Exam         Assessment & Plan:    See Problem List for Assessment and Plan of  chronic medical problems.

## 2024-01-08 NOTE — Progress Notes (Signed)
Patient notified via detailed voice message.

## 2024-01-08 NOTE — Telephone Encounter (Signed)
 Seizure activity can occur with digoxin  toxicity. Can check digoxin  levels to confirm. Blood in urine is an unlikely side effect.

## 2024-01-08 NOTE — Telephone Encounter (Signed)
 FYI Only or Action Required?: FYI only for provider: appointment scheduled on 01/09/24.  Patient was last seen in primary care on 08/30/2023 by Lendia Boby CROME, NP-C.  Called Nurse Triage reporting Seizures.  Symptoms began several months ago.  Interventions attempted: Nothing.  Symptoms are: gradually worsening.  Triage Disposition: See Physician Within 24 Hours  Patient/caregiver understands and will follow disposition?:   Copied from CRM #[id number]. Topic: Clinical - Red Word Triage >> Jan 08, 2024 12:31 PM Rea ORN wrote: Red Word that prompted transfer to Nurse Triage: Pt having reoccuring seizures, last seizure was yesterday. Reason for Disposition  Stopped taking antiseizure medicine (anticonvulsant)  Answer Assessment - Initial Assessment Questions Pt with 40 year hx of absence seizures (first onset with pregnancy) reports increased seizures over the past year. Reports they are very mild, occur about 1-2x/month, last one occurred 2 days ago. Symptoms include feeling fidgety and doing this she doesn't remember doing. Denies falling or hitting head. They last a few minutes. Reports she used to take neurontin and tegretol but stopped 5 years ago as she didn't like taking medication. Reports feeling fine now, speaking in clear sentences. Offered appt with PCP tomorrow morning. Pt declines as she needs an afternoon appt. Scheduled appt with different provider at home office tomorrow afternoon d/t no PCP availability in the afternoon. Reports she isn't currently driving and  that someone with drive her. Advised UC or ED for worsening symptoms.   1. ONSET: When did the seizure occur?     Last seizure was 2 says ago. Has been having intermittent seizures for past 40 years  2. DURATION: How long did the seizure last (or how long has it been happening)? (e.g., seconds, minutes)  Note: Most seizures last less than 5 minutes.     Last a few minutes  3. DESCRIPTION: Describe what happened  during the seizure. Did the body become stiff? Was there any jerking?  Did they lose consciousness during the seizure?     Pt gets fidgety and does things without recollection of them. Lasts a few minutes.  4. CIRCUMSTANCE: What was the person doing when the seizure began?      Was walking at the park with ex husband. Per her ex husband, reports she was talking with someone and got into the car with their assistance but pt has no recollection of having talked to someone and getting back into the car. Was also fidgety. Reports being dx with absence seizures.  5. MENTAL STATUS AFTER SEIZURE: Does the person seem more groggy or sleepy? Does the person know who they are, who you are, and where they are now?      Feels fine now  6. PRIOR SEIZURES: Has the person had a seizure (convulsion) before? (e.g., epilepsy, other cause)  If Yes, ask: When was the last time? and What happened last time?      Yes, 40 years ago. Increased frequency over the past year  7. EPILEPSY: Does the person have epilepsy? Note: Check for medical ID bracelet.     Denies  8. MEDICINES: Does the person take anticonvulsant medications? (e.g., Yes, No; missed doses, any recent changes)     Not taking anything now, was taking neurontin and tegretol. Stopped taking about 5 years ago.  9. INJURY: Was the person hurt or injured during the seizure? (e.g., hit their head, bit their tongue)     Denies  10. OTHER SYMPTOMS: Are there any other symptoms? (e.g., fever, headache)  Anxiety prior to seizures  Protocols used: [facility]

## 2024-01-08 NOTE — Telephone Encounter (Signed)
 Pt c/o medication issue:  1. Name of Medication: digoxin  (LANOXIN ) 0.125 MG tablet   2. How are you currently taking this medication (dosage and times per day)? No 3. Are you having a reaction (difficulty breathing--STAT)? No  4. What is your medication issue? Patient has noticed off and on she has been blood in her urine and her stool. Patient thought it could be dehydration during the summer, but is now certain this is not from dehydration. Patient stated she has been having breakthrough seizures. Patient has reported the seizures to her PCP. Please advise.

## 2024-01-09 ENCOUNTER — Ambulatory Visit (INDEPENDENT_AMBULATORY_CARE_PROVIDER_SITE_OTHER): Admitting: Internal Medicine

## 2024-01-09 ENCOUNTER — Other Ambulatory Visit: Payer: Self-pay | Admitting: Orthopaedic Surgery

## 2024-01-09 VITALS — BP 122/82 | HR 84 | Temp 98.0°F | Ht 67.0 in

## 2024-01-09 DIAGNOSIS — N3001 Acute cystitis with hematuria: Secondary | ICD-10-CM | POA: Diagnosis not present

## 2024-01-09 DIAGNOSIS — Z905 Acquired absence of kidney: Secondary | ICD-10-CM

## 2024-01-09 DIAGNOSIS — I4891 Unspecified atrial fibrillation: Secondary | ICD-10-CM | POA: Diagnosis not present

## 2024-01-09 DIAGNOSIS — R300 Dysuria: Secondary | ICD-10-CM

## 2024-01-09 DIAGNOSIS — G40909 Epilepsy, unspecified, not intractable, without status epilepticus: Secondary | ICD-10-CM

## 2024-01-09 DIAGNOSIS — R310 Gross hematuria: Secondary | ICD-10-CM | POA: Diagnosis not present

## 2024-01-09 LAB — POC URINALSYSI DIPSTICK (AUTOMATED)
Bilirubin, UA: NEGATIVE
Glucose, UA: NEGATIVE
Ketones, UA: NEGATIVE
Nitrite, UA: NEGATIVE
Protein, UA: POSITIVE — AB
Spec Grav, UA: 1.01 (ref 1.010–1.025)
Urobilinogen, UA: 0.2 U/dL
pH, UA: 6 (ref 5.0–8.0)

## 2024-01-09 MED ORDER — AMOXICILLIN-POT CLAVULANATE 875-125 MG PO TABS: 1.0000 | ORAL_TABLET | Freq: Two times a day (BID) | ORAL | 0 refills | Status: AC

## 2024-01-09 NOTE — Telephone Encounter (Signed)
 Will forward to Dr. Mallipeddi for further advice.

## 2024-01-09 NOTE — Telephone Encounter (Signed)
 Patient notified and verbalized understanding.   States she will be seeing pcp this evening.  Will send this note to pcp as well.

## 2024-01-09 NOTE — Patient Instructions (Addendum)
      Blood work was ordered.       Medications changes include :   Augmentin  twice daily x 7 days.  Stop ibuprofen.     A referral was ordered neurology and someone will call you to schedule an appointment.   A referral was ordered urology and someone will call you to schedule an appointment.

## 2024-01-10 ENCOUNTER — Ambulatory Visit: Payer: Self-pay | Admitting: Internal Medicine

## 2024-01-10 LAB — COMPREHENSIVE METABOLIC PANEL WITH GFR
ALT: 17 U/L (ref 0–35)
AST: 22 U/L (ref 0–37)
Albumin: 4.1 g/dL (ref 3.5–5.2)
Alkaline Phosphatase: 98 U/L (ref 39–117)
BUN: 30 mg/dL — ABNORMAL HIGH (ref 6–23)
CO2: 25 meq/L (ref 19–32)
Calcium: 9 mg/dL (ref 8.4–10.5)
Chloride: 101 meq/L (ref 96–112)
Creatinine, Ser: 0.99 mg/dL (ref 0.40–1.20)
GFR: 56.01 mL/min — ABNORMAL LOW (ref >60.00)
Glucose, Bld: 119 mg/dL — ABNORMAL HIGH (ref 70–99)
Potassium: 4.5 meq/L (ref 3.5–5.1)
Sodium: 135 meq/L (ref 135–145)
Total Bilirubin: 0.4 mg/dL (ref 0.2–1.2)
Total Protein: 6.6 g/dL (ref 6.0–8.3)

## 2024-01-10 LAB — CBC WITH DIFFERENTIAL/PLATELET
Basophils Absolute: 0.1 K/uL (ref 0.0–0.1)
Basophils Relative: 0.9 % (ref 0.0–3.0)
Eosinophils Absolute: 0.2 K/uL (ref 0.0–0.7)
Eosinophils Relative: 2.5 % (ref 0.0–5.0)
HCT: 36.8 % (ref 36.0–46.0)
Hemoglobin: 12 g/dL (ref 12.0–15.0)
Lymphocytes Relative: 14.2 % (ref 12.0–46.0)
Lymphs Abs: 1.2 K/uL (ref 0.7–4.0)
MCHC: 32.7 g/dL (ref 30.0–36.0)
MCV: 90 fl (ref 78.0–100.0)
Monocytes Absolute: 0.6 K/uL (ref 0.1–1.0)
Monocytes Relative: 7.4 % (ref 3.0–12.0)
Neutro Abs: 6.1 K/uL (ref 1.4–7.7)
Neutrophils Relative %: 75 % (ref 43.0–77.0)
Platelets: 175 K/uL (ref 150.0–400.0)
RBC: 4.09 Mil/uL (ref 3.87–5.11)
RDW: 16.8 % — ABNORMAL HIGH (ref 11.5–15.5)
WBC: 8.1 K/uL (ref 4.0–10.5)

## 2024-01-11 LAB — CULTURE, URINE COMPREHENSIVE: RESULT:: NO GROWTH

## 2024-01-11 LAB — DIGOXIN LEVEL: Digoxin Level: <0.5 ug/L — ABNORMAL LOW (ref 0.8–2.0)

## 2024-01-15 ENCOUNTER — Encounter: Payer: Self-pay | Admitting: Neurology

## 2024-01-23 ENCOUNTER — Telehealth: Payer: Self-pay | Admitting: Internal Medicine

## 2024-01-23 NOTE — Telephone Encounter (Signed)
 Pt had a bad fall and is in the ICU. She states she has had an EKG while admitted and wants to know if she should reschedule her echo. Please advise.

## 2024-01-23 NOTE — Telephone Encounter (Signed)
 Looks like they did Echo for her yesterday.  Will send to provider for review.  See echo report in care everywhere - done at [facility].

## 2024-01-25 ENCOUNTER — Ambulatory Visit

## 2024-01-25 NOTE — Discharge Summary (Signed)
 DISCHARGE SUMMARY [facility]   Discharge date:   January 25, 2024 Length of stay:    LOS: 6 days    Discharge Service:   [facility] Discharge Attending Physician: No att. providers found Discharge to:    To Home Condition at Discharge:  good Code status:                         Full Code   Hospital Course: Discharge diagnoses Right thigh hematoma Acute blood loss anemia Hyperkalemia Chemical fall Acute kidney injury Paroxysmal atrial fibrillation with rapid ventricular response  73 y.o. female with a PMH significant for chronic anemia, osteoarthritis, renal cancer, alcoholism, protein calorie malnutrition chronic A-fib and alcohol  hepatitis who presented to the ED after mechanical fall while using her walker carrying a tray of cat food and unfortunately injured her right leg specifically around the knee causing severe pain and swelling.  She denies any nausea, vomiting, diarrhea, palpitations, chest pain or shortness of breath.  Workup in the ED is significant for hemoglobin of 10.5, K: 6.0, CK: 6, AKI and repeat potassium after treatment with insulin /dextrose  in the ED has increased from 6 => 6.8.  X-ray of the right knee reveals concern for soft tissue swelling no mention of fracture or bony abnormality, x-rays of the right hip shows no acute fracture along with marked soft tissue edema.  ED physician reports large hematoma in the anterior thigh, RLE which was discussed with orthopedics, Dr. Heyward who advised that he would consult on the case.   The patient was admitted to the medical floor and given furosemide  and pain control.  She was taken to the operating room on 12/6 for I&D of the hematoma.  Operative course was complicated by anemia, hypotension requiring fluid resuscitation and pressors and hyperkalemia.  Ultimately her blood pressure medications were adjusted.  Hyperkalemia and rapid atrial fibrillation were placed under control.  He did not require  additional intervention.  She will be discharged home close outpatient follow-up by home health and Dr. Heyward.  Recommend holding aspirin and Eliquis  will follow-up PCP to ensure stability of her hemoglobin, potassium.    ______________________________________   Admission HPI    Patient admitted on: 01/19/2024  1:50 AM  Patient admitted by: Darin Shelvy Lonni Verdie, MD    CHIEF COMPLAINT: Fall   Day of admission HPI:  Nicole [name]  is a 73 y.o. female with a PMH significant for chronic anemia, osteoarthritis, renal cancer, alcoholism, protein calorie malnutrition chronic A-fib and alcohol  hepatitis who presented to the ED after mechanical fall while using her walker carrying a tray of cat food and unfortunately injured her right leg specifically around the knee causing severe pain and swelling.  She denies any nausea, vomiting, diarrhea, palpitations, chest pain or shortness of breath.  Workup in the ED is significant for hemoglobin of 10.5, K: 6.0, CK: 6, AKI and repeat potassium after treatment with insulin /dextrose  in the ED has increased from 6 => 6.8.  X-ray of the right knee reveals concern for soft tissue swelling no mention of fracture or bony abnormality, x-rays of the right hip shows no acute fracture along with marked soft tissue edema.  ED physician reports large hematoma in the anterior thigh, RLE which was discussed with orthopedics, Dr. Heyward who advised that he would consult on the case.   Patient admitted on Home O2? - no Patient on home anticoagulant? -  yes Patient admitted with Chronic home  foley catheter? - no Foley catheter placed or replaced by another service prior to admission? - no Central Line Status: NONE   Mental Status on Admission: The patient is Alert and oriented to PERSON The patient is Alert And oriented to TIME The patient is Alert and oriented to LOCATION   Physical Exam  Constitutional: She appears acutely ill.  HENT:  Nose: Nose  normal. Mouth/Throat: Oropharynx is clear.  Eyes: Pupils are equal, round, and reactive to light. Conjunctivae are normal.  Pulmonary/Chest: Effort normal.  Musculoskeletal:     Cervical back: Normal range of motion.  Neurological: She is alert and oriented to person, place, and time. She has intact cranial nerves (2-12).  ______________________________________  Mental Status On day of Discharge:  The patient is Alert and oriented to PERSON The patient is Alert And oriented to TIME The patient is Alert and oriented to LOCATION  Vitals:   01/25/24 1300  BP:   Pulse: 76  Resp: 18  Temp:   SpO2: 97%   Physical Exam Constitutional:      Appearance: Normal appearance.  Cardiovascular:     Rate and Rhythm: Normal rate. Rhythm irregular.     Pulses: Normal pulses.     Heart sounds: Normal heart sounds.  Pulmonary:     Effort: Pulmonary effort is normal.     Breath sounds: Normal breath sounds.  Abdominal:     General: Bowel sounds are normal.     Palpations: Abdomen is soft.  Skin:    Comments: Large ecchymoses over the right thigh without induration or fluctuance  Neurological:     Mental Status: She is alert.      CODE STATUS :                    Full Code   An advanced care planning discussion was  had with patient and/or patient's decisions maker (documented separately).  Patient discharged on Home O2? - no Patient discharged on home anticoagulant? -  no  Foley Catheter status: None Central Line Status: NONE  Time Spent on Discharge I spent greater than 30 minutes counseling and coordinating care for the discharge of this patient.  Discharge Medications     Your Medication List     STOP taking these medications    ascorbic acid  (vitamin C ) 1000 MG tablet Commonly known as: VITAMIN C    aspirin 81 MG tablet Commonly known as: ECOTRIN   cholecalciferol (vitamin D3-125 mcg (5,000 unit)) 125 mcg (5,000 unit) tablet   ELIQUIS  5 mg Tab Generic drug:  apixaban    folic acid  1 MG tablet Commonly known as: FOLVITE    furosemide  20 MG tablet Commonly known as: LASIX    lactulose  10 gram/15 mL solution   lisinopril  2.5 MG tablet Commonly known as: PRINIVIL ,ZESTRIL    magnesium  30 mg tablet   metFORMIN  500 MG tablet Commonly known as: GLUCOPHAGE    multivitamin per tablet Commonly known as: TAB-A-VITE/THERAGRAN   omeprazole 20 MG capsule Commonly known as: PriLOSEC   potassium gluconate 595 mg (99 mg) Tab   propylene glycol 0.6 % Drop   rifAXIMin  550 mg Tab Commonly known as: XIFAXAN    simethicone 125 MG chewable tablet Commonly known as: MYLICON   thiamine  100 MG tablet Commonly known as: B-1       CONTINUE taking these medications    diclofenac sodium 1 % gel Commonly known as: VOLTAREN   digoxin  125 mcg (0.125 mg) tablet Commonly known as: LANOXIN  Take 0.5 tablets (62.5 mcg  total) by mouth daily.   metoPROLOL  tartrate 50 MG tablet Commonly known as: Lopressor  Take 1 tablet (50 mg total) by mouth two (2) times a day.   mirtazapine  15 MG tablet Commonly known as: REMERON  Take 1 tablet (15 mg total) by mouth nightly.   rosuvastatin 5 MG tablet Commonly known as: CRESTOR Take 1 tablet (5 mg total) by mouth daily.   tizanidine  4 MG tablet Commonly known as: ZANAFLEX  Take 1 tablet (4 mg total) by mouth every eight (8) hours as needed.       _____________________________________  Nutrition:                                  Diet Instructions     Discharge diet (specify)     Discharge Nutrition Therapy: Regular                     ___________________________________________  Discharge Instructions   Nutrition:                                  Diet Instructions     Discharge diet (specify)     Discharge Nutrition Therapy: Regular       Activity:                                   Activity Instructions     Activity as tolerated         Appointments:                          Appointments which have been scheduled for you    Jan 31, 2024 1:15 PM POST OP ORTHOPAEDICS with Manus Dasie Gobble, DO [facility] at [facility] [place]) [address] [place] [address] [phone]         Follow Up:                              Follow Up instructions and Outpatient Referrals    Ambulatory Referral to Home Health     Reason for referral: Home health   Physician to follow patient's care: PCP Comment - Glade Hope, MD   Disciplines requested:  Physical Therapy Occupational Therapy     Physical Therapy requested: Evaluate and treat   Occupational Therapy Requested: Evaluate and treat   Requested [facility] Date: 01/25/2024   Requested follow up plan: You would evaluate and manage.   Ambulatory Referral to Orthopedics     Reason for referral: I&D of hematoma LLE   Specific Service Requested: Hospital/ED Follow-up   Is this referral for a new fracture?: No   Requested follow up plan: You would evaluate and manage.   Call MD for:  redness, tenderness, or signs of infection (pain, swelling,  redness, odor or green/yellow discharge around incision site)     Call MD for: Temperature > 38.5 Celsius ( > 101.3 Fahrenheit)         Allergies  Allergen Reactions   Iodinated Contrast Media Shortness Of Breath and Swelling    Hand swelling from contrast dye for MRI   Other Anxiety and Shortness Of Breath    UNSPECIFIED  REACTION to fast foods and processed food  UNSPECIFIED REACTION to fast foods and processed food like hot dogs   Shellfish Containing Products Shortness Of Breath and Swelling     Past Medical History[1]  Past Surgical History[2]   Family History[3]   Current Medications[4]  Imaging  ECG 12 Lead Result Date: 01/25/2024 Atrial fibrillation with rapid ventricular response Abnormal ECG When compared with ECG of 19-Jan-2024 05:51, Nonspecific T wave abnormality now evident in  Anterior leads  CT Abdomen Pelvis Wo Contrast Result Date: 01/23/2024 Exam: CT of the Chest, Abdomen and Pelvis without Contrast  History: 73 year old female with shock/hemorrhage. Known subcutaneous hematoma in the right thigh as seen on recent CT.  Technique: Routine CT of the chest, abdomen and pelvis without IV contrast. AEC (automated exposure control) and/or manual techniques such as size-specific kV and mAs are employed where appropriate to reduce radiation exposure for all CT exams.  Comparison: CT right lower extremity 01/22/2024, CT abdomen pelvis 08/05/2019, CT chest abdomen pelvis 12/04/2015  Evaluation of the solid organs and vasculature is limited in the absence of IV contrast. Additional limitations include artifact related to patient positioning/bilateral upper extremities.  Chest CT Findings:  CARDIAC/MEDIASTINUM:  The heart measures upper limit of normal. Mild leftward mediastinal shift. No pericardial effusion. Coronary atherosclerosis. Normal caliber thoracic aorta. Mild dilation of the main pulmonary artery up to 3.1 cm, which can be seen with pulmonary arterial hypertension. No mediastinal or hilar adenopathy.  LUNGS/PLEURA:   Patent central airways. Subsegmental atelectasis in the bilateral lower lobes. No consolidation. No new or enlarging suspicious pulmonary nodule.  Trace bilateral pleural effusions. No pneumothorax.  SOFT TISSUES:  Increased size of a left thyroid  lobe heterogeneous nodule measuring 4.5 cm (2:25), previously 3.0 cm.  Abdomen and Pelvis CT Findings:  HEPATOBILIARY:  Normal liver contour.  No focal hepatic lesion on this non-contrasted examination. The gallbladder is moderately distended. Few layering internal gallstones. No intrahepatic or extrahepatic bile duct dilation.  PANCREAS:  Atrophic pancreas. No pancreatic ductal dilatation.  SPLEEN:  Borderline splenomegaly.  ADRENALS:  No adrenal nodule.  KIDNEYS/URETERS:  The right kidney is surgically absent. No nodularity  along the nephrectomy bed. No left hydronephrosis or nephrolithiasis.  BLADDER: The bladder is moderately distended.  PELVIC ORGANS:  Atrophic uterus. No suspicious adnexal mass. Small volume pelvic fluid.  BOWEL/MESENTERY: Large hiatal hernia. No bowel obstruction. No acute inflammatory changes of the bowel. Normal appendix.  PERITONEUM/RETROPERITONEUM: No fluid collection. No ascites.  No pneumoperitoneum.  LYMPH NODES:  No abdominal or pelvic lymphadenopathy.  VASCULAR:  Normal caliber abdominal aorta. Moderate aortic atherosclerosis.  BONES/SOFT TISSUES: Partially imaged left total hip arthroplasty, with associated metallic artifact limiting evaluation of adjacent structures. Multilevel degenerative changes of the spine, most pronounced at L2-L3. Similar chronic T11 superior endplate deformity. Healed left rib fractures. Healed left superior and inferior pubic rami fractures. No aggressive osseous lesions.  3.0 cm intermediate attenuation collection along the left gluteal subcutaneous tissues (2:82), possible sequela of injection. Simple attenuation fluid collection lateral to the left hip measuring 6.8 x 4.8 x 3.9 cm (3:61, 2:131).  Incomplete visualization of the hematoma on the right thigh with trace locule of internal gas, seen to better advantage on recent dedicated CT right lower extremity.     --No acute abnormality in the chest, abdomen, or pelvis within the limitations of noncontrast examination.  --Incomplete visualization of the known right thigh hematoma.  --Simple attenuation fluid collection lateral to the left hip measuring up to 6.8 cm,  may represent a seroma.  --Trace bilateral pleural effusions.  --Increased size of a left thyroid  lobe nodule measuring up to 4.5 cm. Recommend thyroid  ultrasound.  Signed (Electronic Signature): 01/23/2024 8:18 AM Signed By: Wadie Farr, MD  CT Chest Wo Contrast Result Date: 01/23/2024 Exam: CT of the Chest, Abdomen and Pelvis without Contrast  History:  73 year old female with shock/hemorrhage. Known subcutaneous hematoma in the right thigh as seen on recent CT.  Technique: Routine CT of the chest, abdomen and pelvis without IV contrast. AEC (automated exposure control) and/or manual techniques such as size-specific kV and mAs are employed where appropriate to reduce radiation exposure for all CT exams.  Comparison: CT right lower extremity 01/22/2024, CT abdomen pelvis 08/05/2019, CT chest abdomen pelvis 12/04/2015  Evaluation of the solid organs and vasculature is limited in the absence of IV contrast. Additional limitations include artifact related to patient positioning/bilateral upper extremities.  Chest CT Findings:  CARDIAC/MEDIASTINUM:  The heart measures upper limit of normal. Mild leftward mediastinal shift. No pericardial effusion. Coronary atherosclerosis. Normal caliber thoracic aorta. Mild dilation of the main pulmonary artery up to 3.1 cm, which can be seen with pulmonary arterial hypertension. No mediastinal or hilar adenopathy.  LUNGS/PLEURA:   Patent central airways. Subsegmental atelectasis in the bilateral lower lobes. No consolidation. No new or enlarging suspicious pulmonary nodule.  Trace bilateral pleural effusions. No pneumothorax.  SOFT TISSUES:  Increased size of a left thyroid  lobe heterogeneous nodule measuring 4.5 cm (2:25), previously 3.0 cm.  Abdomen and Pelvis CT Findings:  HEPATOBILIARY:  Normal liver contour.  No focal hepatic lesion on this non-contrasted examination. The gallbladder is moderately distended. Few layering internal gallstones. No intrahepatic or extrahepatic bile duct dilation.  PANCREAS:  Atrophic pancreas. No pancreatic ductal dilatation.  SPLEEN:  Borderline splenomegaly.  ADRENALS:  No adrenal nodule.  KIDNEYS/URETERS:  The right kidney is surgically absent. No nodularity along the nephrectomy bed. No left hydronephrosis or nephrolithiasis.  BLADDER: The bladder is moderately distended.  PELVIC ORGANS:  Atrophic  uterus. No suspicious adnexal mass. Small volume pelvic fluid.  BOWEL/MESENTERY: Large hiatal hernia. No bowel obstruction. No acute inflammatory changes of the bowel. Normal appendix.  PERITONEUM/RETROPERITONEUM: No fluid collection. No ascites.  No pneumoperitoneum.  LYMPH NODES:  No abdominal or pelvic lymphadenopathy.  VASCULAR:  Normal caliber abdominal aorta. Moderate aortic atherosclerosis.  BONES/SOFT TISSUES: Partially imaged left total hip arthroplasty, with associated metallic artifact limiting evaluation of adjacent structures. Multilevel degenerative changes of the spine, most pronounced at L2-L3. Similar chronic T11 superior endplate deformity. Healed left rib fractures. Healed left superior and inferior pubic rami fractures. No aggressive osseous lesions.  3.0 cm intermediate attenuation collection along the left gluteal subcutaneous tissues (2:82), possible sequela of injection. Simple attenuation fluid collection lateral to the left hip measuring 6.8 x 4.8 x 3.9 cm (3:61, 2:131).  Incomplete visualization of the hematoma on the right thigh with trace locule of internal gas, seen to better advantage on recent dedicated CT right lower extremity.     --No acute abnormality in the chest, abdomen, or pelvis within the limitations of noncontrast examination.  --Incomplete visualization of the known right thigh hematoma.  --Simple attenuation fluid collection lateral to the left hip measuring up to 6.8 cm, may represent a seroma.  --Trace bilateral pleural effusions.  --Increased size of a left thyroid  lobe nodule measuring up to 4.5 cm. Recommend thyroid  ultrasound.  Signed (Electronic Signature): 01/23/2024 8:18 AM Signed By: Wadie Farr, MD  CT Lower Extremity Right Wo  Contrast Result Date: 01/22/2024 Exam:  CT of the Right  Lower Extremity without Contrast  History:  Continued blood loss, ongoing anemia. Right thigh hematoma status post evacuation.  Technique: Helical, unenhanced CT of the right   thigh/femur with multiplanar reformatted images, field of view extending from above the hip to below the knee.   AEC (automated exposure control) and/or manual techniques such as size-specific kV and mAs are employed where appropriate to reduce radiation exposure for all CT exams.  Comparison:  CT and Radiographs 01/19/2024  Findings:  From proximal thigh to the knee, the anterolateral subcutaneous tissue has an elongated lobular hyperdense hematoma propagating over a 33 cm in length; this is decreased in volume particularly along the distal half of the thigh. Its proximal greatest cross-section measures 3.3 cm in depth by 10.2 cm in breadth. Within its mid and proximal aspects, small locules of air are consistent with recent procedure. Associated with the decreased bulk, there is no longer extrinsic mass effect upon the superficial fascia of the anterior compartment. The surrounding subcutaneous tissue again contains nonlocalized reticular edema and/or unorganized blood products. As before, there is no deep space or intramuscular involvement.  In general, there remains muscle atrophy and fatty infiltration. In the thigh, this is symmetric between the compartments. In the lower leg, there remains severe fatty replacement of the medial gastrocnemius. Mild peripheral atherosclerotic calcification is present.  No acute fracture or traumatic malalignment. Chronic findings again include right hip moderate degenerative arthrosis. Pubic symphysis degenerative arthrosis. Chronic hamstring origin enthesopathy at the ischial tuberosity. Knee degenerative arthrosis that is moderate to severe in the medial compartment and moderate in the patellofemoral compartment. No knee effusion. Intact quadriceps and patellar tendons.    1.    Right thigh anterolateral subcutaneous hematoma has decreased in volume particularly along the distal half of the thigh. 2.    No deep space involvement. 3.    No acute osseous abnormality. 4.     Chronic findings as above.  Signed (Electronic Signature): 01/22/2024 3:14 PM Signed By: Nelwyn JAYSON Blush, MD  Echocardiogram W Colorflow Spectral Doppler Result Date: 01/22/2024 Patient Info Name:     Mellie Buccellato Age:     72 years DOB:     [birth year] Gender:     Female MRN:     [id number] Accession #:     [id number] [facility] Account #:     [id number] Ht:     173 cm Wt:     78 kg BSA:     1.95 m2 BP:     64 /     40 mmHg HR:     66 bpm Exam Date:     01/22/2024 9:49 AM Admit Date:     01/19/2024 Exam Type:     ECHOCARDIOGRAM W COLORFLOW SPECTRAL DOPPLER Technical Quality:     Fair Staff Sonographer:     Mliss Gate Ordering Physician:     Margart Elsie Dragon Study Info Indications      - CHF Procedure(s)   Complete two-dimensional, color flow and Doppler transthoracic echocardiogram is performed. Summary   1. The left ventricle is normal in size with mildly increased wall thickness.   2. The left ventricular systolic function is normal, LVEF is visually estimated at 65-70%.   3. The right ventricle is normal in size, with probably normal systolic function.   4. IVC size and inspiratory change suggest mildly elevated right atrial pressure. (5-10 mmHg). Left Ventricle   The left ventricle is normal  in size with mildly increased wall thickness. The left ventricular systolic function is normal, LVEF is visually estimated at 65-70%. Left ventricular diastolic function cannot be accurately assessed. Right Ventricle   The right ventricle is normal in size, with probably normal systolic function. Left Atrium   The left atrium is normal in size. Right Atrium   The right atrium is normal in size. Aortic Valve   The aortic valve is probably trileaflet with mildly thickened leaflets with normal excursion. There is no significant aortic regurgitation. There is no evidence of a significant transvalvular gradient. Mitral Valve   The mitral valve leaflets are normal with normal leaflet mobility. Mitral annular  calcification is present (mild). There is trivial mitral valve regurgitation. Tricuspid Valve   The tricuspid valve leaflets are normal, with normal leaflet mobility. There is no significant tricuspid regurgitation. Pulmonic Valve   Pulmonary valve is not well visualized. There is trivial pulmonic regurgitation. Aorta   The aorta is normal in size in the visualized segments. Inferior Vena Cava   IVC size and inspiratory change suggest mildly elevated right atrial pressure. (5-10 mmHg). Pericardium/Pleural   There is no pericardial effusion. Ventricles ---------------------------------------------------------------------- Name                                 Value        Normal ---------------------------------------------------------------------- LV Dimensions 2D/MM ----------------------------------------------------------------------  IVS Diastolic Thickness (2D)                                1.0 cm       0.6-0.9 LVID Diastole (2D)                  4.5 cm       3.8-5.2  LVPW Diastolic Thickness (2D)                                1.1 cm       0.6-0.9 LVID Systole (2D)                   2.6 cm       2.2-3.5 LVOT Diameter                       1.7 cm               LV Mass Index (2D Cubed)           84 g/m2         43-95  Relative Wall Thickness (2D)                                  0.49        <=0.42 LV Function ---------------------------------------------------------------------- LV EF (4C MOD)                        74 %                LV Diastolic Volume Index (BP MOD)                        32.3 ml/m2     29.0-61.0 LV EF (BP MOD)  76 %         54-74 RV Dimensions 2D/MM ----------------------------------------------------------------------  RV Basal Diastolic Dimension                           3.1 cm       2.5-4.1 Atria ---------------------------------------------------------------------- Name                                 Value        Normal  ---------------------------------------------------------------------- LA Dimensions ---------------------------------------------------------------------- LA Dimension (2D)                   3.0 cm       2.7-3.8 LA Volume Index (4C A-L)        25.08 ml/m2               LA Volume Index (2C A-L)        23.72 ml/m2               LA Volume (BP MOD)                   45 ml               LA Volume Index (BP MOD)        23.11 ml/m2   16.00-34.00 RA Dimensions ---------------------------------------------------------------------- RA Area (4C)                      17.5 cm2        <=18.0 RA Area (4C) Index              9.0 cm2/m2               RA ESV Index (4C MOD)             19 ml/m2         15-27 Left Ventricular Outflow Tract ---------------------------------------------------------------------- Name                                 Value        Normal ---------------------------------------------------------------------- LVOT 2D ---------------------------------------------------------------------- LVOT Diameter                       1.7 cm               LVOT Area                          2.3 cm2               LVOT Doppler ---------------------------------------------------------------------- LVOT Peak Velocity                 0.8 m/s               LVOT VTI                             16 cm               LVOT Stroke Volume                   37 ml               LVOT SI  19 ml/m2 Aortic Valve ---------------------------------------------------------------------- Name                                 Value        Normal ---------------------------------------------------------------------- AV Doppler ---------------------------------------------------------------------- AV Peak Velocity                   1.4 m/s               AV Peak Gradient                    8 mmHg               AV Mean Gradient                    4 mmHg               AV VTI                               25 cm               AV  Area (Cont Eq VTI)              1.5 cm2         >=3.0 AV Area Index (Cont Eq VTI)     0.8 cm2/m2               AV Area (Cont Eq Vel)              1.4 cm2               AV Area Index (Cont Eq Vel)     0.7 cm2/m2               AV DI (Vel)                           0.60               AV DI (VTI)                           0.66 Mitral Valve ---------------------------------------------------------------------- Name                                 Value        Normal ---------------------------------------------------------------------- MV Diastolic Function ---------------------------------------------------------------------- MV E Peak Velocity                113 cm/s               MV Annular TDI ---------------------------------------------------------------------- MV Septal e' Velocity             9.4 cm/s         >=8.0 MV E/e' (Septal)                      12.1               MV Lateral e' Velocity           14.4 cm/s        >=10.0 MV E/e' (Lateral)  7.8               MV e' Average                    11.9 cm/s               MV E/e' (Average)                     10.0 Tricuspid Valve ---------------------------------------------------------------------- Name                                 Value        Normal ---------------------------------------------------------------------- TV Regurgitation Doppler ---------------------------------------------------------------------- TR Peak Velocity                   2.0 m/s               Estimated PAP/RSVP ---------------------------------------------------------------------- RA Pressure                         8 mmHg           <=5 RV Systolic Pressure               24 mmHg           <36 Aorta ---------------------------------------------------------------------- Name                                 Value        Normal ---------------------------------------------------------------------- Ascending Aorta  ---------------------------------------------------------------------- Ao Root Diameter (2D)               2.4 cm               Ao Root Diam Index (2D)          1.2 cm/m2               Ascending Aorta Diameter            3.0 cm Venous ---------------------------------------------------------------------- Name                                 Value        Normal ---------------------------------------------------------------------- IVC/SVC ---------------------------------------------------------------------- IVC Diameter (Insp 2D)              1.1 cm               IVC Diameter (Exp 2D)               2.5 cm         <=2.1  IVC Diameter Percent Change (2D)                                  56 %          >=50 Report Signatures Finalized by Earla Maude Currier  MD on 01/22/2024 01:29 PM  CT Lower Extremity Right Wo Contrast Result Date: 01/19/2024 Exam:  CT of the Right  Lower Extremity without Contrast  History:  Right upper leg/knee injury, mechanical fall. Evaluate hematoma.  Technique: Helical, unenhanced CT of the right  thigh/femur with multiplanar reformatted images, field of view extending from above the hip to below the knee.   AEC (automated  exposure control) and/or manual techniques such as size-specific kV and mAs are employed where appropriate to reduce radiation exposure for all CT exams.  Comparison:  Radiographs 01/19/2024  Findings:  From proximal thigh to the knee, the anterolateral subcutaneous tissue has an elongated lobular hyperdense hematoma measuring 33 cm in length with maximum cross-section approximately 4 cm in depth and 9 cm in breadth. This extends to its interface with the superficial fascia of the anterior compartment. The surrounding subcutaneous tissue has nonlocalized reticular edema and/or unorganized blood products. The hematoma exerts mass effect upon the vastus lateralis, yet there is no deep space or intramuscular involvement. There is no soft tissue air.  In general, there is muscle  atrophy and fatty infiltration. In the thigh, this is symmetric between the compartments. In the lower leg, there is severe fatty replacement of the medial gastrocnemius. Mild peripheral atherosclerotic calcification is present.  No acute fracture or traumatic malalignment. Chronic findings include right hip moderate degenerative arthrosis. Pubic symphysis degenerative arthrosis. Chronic hamstring origin enthesopathy at the ischial tuberosity. Knee degenerative arthrosis that is moderate to severe in the medial compartment and moderate in the patellofemoral compartment. No knee effusion. Intact quadriceps and patellar tendons.    1.    Large subcutaneous hematoma extending from proximal right thigh to the knee (approximate 618 mL volume). 2.    No acute fracture or traumatic malalignment. 3.    Chronic findings as above.  Signed (Electronic Signature): 01/19/2024 3:39 PM Signed By: Nelwyn JAYSON Blush, MD  ECG 12 Lead Result Date: 01/19/2024 Atrial fibrillation Abnormal ECG When compared with ECG of 23-Dec-2019 22:24, Vent. rate has decreased by  48 bpm QRS axis shifted left Nonspecific T wave abnormality no longer evident in Anterolateral leads Confirmed by Cherie Searle ([id number]) on 01/19/2024 12:28:57 PM  XR Knee 4 Or More Views Right Result Date: 01/19/2024 Exam:  Right Knee  History:  Clemens, pain in knee  Technique:  4 views right knee  Comparison:  None.  Findings:  No acute fracture is evident in the right knee. Marked soft tissue swelling is evident laterally in the distal thigh and knee. Prepatellar soft tissue swelling is demonstrated.    1. Marked soft tissue swelling in the lateral distal right thigh and knee  2. No acute fracture evident    Signed (Electronic Signature): 01/19/2024 3:29 AM Signed By: Luke Batter  XR Femur 2 Views Right Result Date: 01/19/2024 Exam:  Right femur right hip  History:  Right upper leg pain, injury, swelling. Patient tripped and fell, significant swelling right thigh   Technique:  3 views right hip, 4 views right femur  Comparison:  None.  Findings:  AP view of the pelvis, AP and frog-lateral views of the right hip were submitted. No acute fracture is demonstrated. There are moderate degenerative changes in the right hip. Left hip prosthesis is present.   4 views of the right femur were submitted. No acute fracture is demonstrated. Marked soft tissue edema is noted in the lateral, distal right thigh    1. No acute fracture in the right hip or bony pelvis  2. No acute fracture of the right femur  3. Marked soft tissue edema in the lateral, distal right thigh    Signed (Electronic Signature): 01/19/2024 3:28 AM Signed By: Luke Batter  XR Hip 2 Views Right Result Date: 01/19/2024 Exam:  Right femur right hip  History:  Right upper leg pain, injury, swelling. Patient tripped and fell, significant swelling right thigh  Technique:  3 views right hip, 4 views right femur  Comparison:  None.  Findings:  AP view of the pelvis, AP and frog-lateral views of the right hip were submitted. No acute fracture is demonstrated. There are moderate degenerative changes in the right hip. Left hip prosthesis is present.   4 views of the right femur were submitted. No acute fracture is demonstrated. Marked soft tissue edema is noted in the lateral, distal right thigh    1. No acute fracture in the right hip or bony pelvis  2. No acute fracture of the right femur  3. Marked soft tissue edema in the lateral, distal right thigh    Signed (Electronic Signature): 01/19/2024 3:28 AM Signed By: Luke Batter   Lab Results   Recent Labs    01/25/24 1057  WBC 6.3  HGB 8.9*  HCT 27.4*  PLT 139*   Recent Labs    01/23/24 0426 01/24/24 0523 01/25/24 1057  NA 137   < > 132*  K 5.5*   < > 5.2*  CL 109*   < > 100  CO2 20.2*   < > 25.2  BUN 26*   < > 21*  CREATININE 1.26*   < > 1.08  GLU 139   < > 164  CALCIUM  8.2*   < > 8.8  ALBUMIN  2.6*  --   --   PROT 5.3*  --   --   BILITOT 0.9  --    --   AST 18  --   --   ALT <6*  --   --   ALKPHOS 79  --   --   MG 1.9  --   --    < > = values in this interval not displayed.   No results for input(s): CKTOTAL, CKMB, PCTCKMB, TROPONINI, EDTPNI, BNP, INR, LABPROT, APTT, DDIMER in the last 72 hours. No results for input(s): WBCUA, NITRITE, LEUKOCYTESUR, BACTERIA, RBCUA, BLOODU, GLUCOSEU, PROTEINUA, KETONESU, KETUR in the last 72 hours. No results for input(s): OPIAU, BENZU, TRICYCLIC, PCPU, AMPHU, COCAU, CANNAU, BARBU, ETOH, ACETAMIN, SALICYLATE in the last 72 hours. No results for input(s): PREGTESTUR, PREGPOC in the last 72 hours. No results for input(s): OCCULTBLD, RAPSCRN, CDIFRPCR, CDIFFNAP1, A1C, CHOL, LDL, HDL, TRIG in the last 72 hours. No results for input(s): O2SOUR, FIO2ART, PHART, PCO2ART, PO2ART, HCO3ART, O2SATART, BEART in the last 72 hours.   Home Medications   Prior to Admission medications  Medication Dose, Route, Frequency  diclofenac sodium (VOLTAREN) 1 % gel No dose, route, or frequency recorded.  digoxin  (LANOXIN ) 125 mcg (0.125 mg) tablet 0.0625 mg, Daily (standard)  metoprolol  tartrate (LOPRESSOR ) 50 MG tablet 50 mg, 2 times a day (standard)  mirtazapine  (REMERON ) 15 MG tablet 15 mg, Nightly  rosuvastatin (CRESTOR) 5 MG tablet 5 mg, Daily (standard)  tizanidine  (ZANAFLEX ) 4 MG tablet 4 mg, Every 8 hours PRN   Elspeth GORMAN Kay, MD Hospitalist, [facility] 01/25/24, 6:52 PM       [1] Past Medical History: Diagnosis Date   A-fib (CMS-HCC)    Anasarca 12/24/2019   Anemia    Asthma (HHS-HCC)    Atrial fibrillation with RVR    (CMS-HCC) 12/24/2019   Congestive heart failure (CHF) (CMS-HCC) 12/24/2019   Focal seizures (CMS-HCC)    GERD (gastroesophageal reflux disease)    Hiatal hernia    History of ETOH abuse 12/24/2019   History of kidney cancer    right   Hypertension    Hypoalbuminemia  12/24/2019  Seizures    (CMS-HCC)   [2] Past Surgical History: Procedure Laterality Date   ANKLE FRACTURE SURGERY  2008   right and left   ESOPHAGOGASTRODUODENOSCOPY     NASAL SINUS SURGERY     NEPHRECTOMY Right    ORIF ORBITAL FRACTURE  2019   PR DRAIN LOWER LEG DEEP ABSC/HEMATOMA Right 01/20/2024   Procedure: I&D LEG/ANK;HEMATOMA RIGHT UPPER LEG;  Surgeon: Heyward Manus Dawn, DO;  Location: OR [facility];  Service: Orthopedics  [3] Family History Problem Relation Age of Onset   Heart attack Father   [4] No current facility-administered medications for this encounter.  Current Outpatient Medications:    diclofenac sodium (VOLTAREN) 1 % gel, , Disp: , Rfl:    digoxin  (LANOXIN ) 125 mcg (0.125 mg) tablet, Take 0.5 tablets (62.5 mcg total) by mouth daily., Disp: , Rfl:    metoprolol  tartrate (LOPRESSOR ) 50 MG tablet, Take 1 tablet (50 mg total) by mouth two (2) times a day., Disp: , Rfl:    mirtazapine  (REMERON ) 15 MG tablet, Take 1 tablet (15 mg total) by mouth nightly., Disp: , Rfl:    rosuvastatin (CRESTOR) 5 MG tablet, Take 1 tablet (5 mg total) by mouth daily., Disp: , Rfl:    tizanidine  (ZANAFLEX ) 4 MG tablet, Take 1 tablet (4 mg total) by mouth every eight (8) hours as needed., Disp: , Rfl:

## 2024-01-25 NOTE — Nursing Note (Signed)
°   01/25/24 1436  Final Assessment  Patient's Post Acute Contact Information See demo  Has a PCP appointment been made? No  Has a specialist appointment been made? Yes  Post Acute Facility needed at discharge? No  Home Care/ Home Medical Equipment needed at discharge? Yes  Home Care/ Home Medical Equipment Home Health (specify)  Home Health Provider (Name/Phone #) [facility]  Outpatient/Community Referrals needed for discharge? No  Currently receiving outpatient dialysis? N/A  Discharge Disposition Home w/ Home Health  Transportation Anticipated family or friend will provide  Quality data for continuing care services shared with patient and/or representative? Yes  Patient and/or family were provided with choice of facilities / services that are available and appropriate to meet post hospital care needs? Yes  Final Assessment Complete  Final Assessment Complete Yes

## 2024-01-26 ENCOUNTER — Ambulatory Visit: Payer: Self-pay

## 2024-01-26 ENCOUNTER — Telehealth: Payer: Self-pay | Admitting: *Deleted

## 2024-01-26 NOTE — Transitions of Care (Post Inpatient/ED Visit) (Signed)
° °  01/26/2024  Name: Nicole [name] MRN: [id number] DOB: 06/08/1950  Today's TOC FU Call Status: Today's TOC FU Call Status:: Unsuccessful Call (1st Attempt) Unsuccessful Call (1st Attempt) Date: 01/26/24  Attempted to reach the patient regarding the most recent Inpatient visit.  Left HIPAA compliant voice message   Follow Up Plan: Additional outreach attempts will be made to reach the patient to complete the Transitions of Care (Post Inpatient/ED visit) call.   Pls call/ message for questions,  Yu Peggs Mckinney Marguita Venning, RN, BSN, CCRN Alumnus RN Care Manager  Transitions of Care  [facility] - [facility] [phone]: direct office

## 2024-01-26 NOTE — Telephone Encounter (Signed)
° °  Message from Tinnie BROCKS sent at 01/26/2024  2:51 PM EST  Reason for Triage: Pt was in hospital last week w/ open wound from fall. She says they did not change her dressing on the wound or offer antibiotics so she has been using an ointment that she has at home and dressing it herself. She says she thinks the ointment is an antibiotic. I scheduled hospital follow up for next week, but pt needs a call before then to make sure she is dressing her wound correctly. #[phone]    Attempt #2. LM for pt to return call for wound care instructions

## 2024-01-26 NOTE — Telephone Encounter (Signed)
 Patient notified and verbalized understanding.

## 2024-01-26 NOTE — Telephone Encounter (Signed)
 Routed to office at [facility] for follow up

## 2024-01-26 NOTE — Telephone Encounter (Signed)
 1st attempt, no answer. Left voicemail for patient to return call from nurse triage.   Message from Tinnie BROCKS sent at 01/26/2024  2:51 PM EST  Reason for Triage: Pt was in hospital last week w/ open wound from fall. She says they did not change her dressing on the wound or offer antibiotics so she has been using an ointment that she has at home and dressing it herself. She says she thinks the ointment is an antibiotic. I scheduled hospital follow up for next week, but pt needs a call before then to make sure she is dressing her wound correctly. #[phone]

## 2024-01-27 ENCOUNTER — Other Ambulatory Visit: Payer: Self-pay | Admitting: Internal Medicine

## 2024-01-29 ENCOUNTER — Telehealth: Payer: Self-pay | Admitting: *Deleted

## 2024-01-29 ENCOUNTER — Ambulatory Visit: Payer: Self-pay

## 2024-01-29 NOTE — Transitions of Care (Post Inpatient/ED Visit) (Signed)
 01/29/2024  Name: Nicole [name] MRN: [id number] DOB: Aug 17, 1950  Today's TOC FU Call Status: Today's TOC FU Call Status:: Successful TOC FU Call Completed TOC FU Call Complete Date: 01/29/24  Patient's Name and Date of Birth confirmed. Name, DOB  Transition Care Management Follow-up Telephone Call Date of Discharge: 01/25/24 Discharge Facility: Other ([facility]) Name of Other ([facility]) Discharge Facility: [facility] Type of Discharge: Inpatient Admission Primary Inpatient Discharge Diagnosis:: Mechanical fall with (R) LE hematoma; hyperkalemia How have you been since you were released from the hospital?: Better Any questions or concerns?: Yes Patient Questions/Concerns:: I have been having diarrhea in the mornings ever since I got home from the hospital; I plan to talk to Vickie about that when I see her Friday Patient Questions/Concerns Addressed: Other: (confirmed plans to attend PCP office visit 02/02/24; call was apparently dropped during [facility] outreach; attempted to recontact patient x 2 immediately afterward: both unsuccessful)  Unable to fully assess/ complete TOC outreach: first call to patient was dropped and subsequent immediate re-attempts x 2 both unsuccessful   Items Reviewed: Did you receive and understand the discharge instructions provided?:  (unable to assess: first call to patient was dropped and subsequent attempts x 2 unsuccessful) Medications obtained,verified, and reconciled?: No Medications Not Reviewed Reasons:: Other: (unable to assess: first call to patient was dropped and subsequent attempts x 2 unsuccessful) Any new allergies since your discharge?:  (unable to assess: first call to patient was dropped and subsequent attempts x 2 unsuccessful) Dietary orders reviewed?: No (unable to assess: first call to patient was dropped and subsequent attempts x 2 unsuccessful) Do you have support at home?: Yes People in Home [RPT]: spouse Name of Support/Comfort  Primary Source: Reports independent in self-care activities; resides with supportive ex-spouse: assists as/ if needed/ indicated  Medications Reviewed Today: Medications Reviewed Today     Reviewed by Camaria Gerald M, RN (Registered Nurse) on 01/29/24 at 1532  Med List Status: <None>   Medication Order Taking? Sig Documenting Provider Last Dose Status Informant  Accu-Chek Softclix Lancets lancets [id number]  USE ONE LANCET IN THE MORNING, AT NOON, AND AT BEDTIME Henson, [name], NP-C  Active Self  acetaminophen  (TYLENOL ) 500 MG tablet [id number]  Take 500 mg by mouth 2 (two) times daily. [provider]  Active Self           Med Note (TASTET, HANNAH E   Wed Jun 08, 2022  4:04 PM) Use as needed for pain, usually takes BID  apixaban  (ELIQUIS ) 5 MG TABS tablet [id number]  TAKE 1 TABLET BY MOUTH TWICE A DAY Henson, [name], NP-C  Active   Ascorbic Acid  (VITAMIN C ) 1000 MG tablet [id number]  Take 1,000 mg by mouth in the morning. [provider]  Active Self  b complex vitamins capsule [id number]  Take 1 capsule by mouth daily. [provider]  Active Self  Blood Glucose Monitoring Suppl DEVI [id number]  1 each by Does not apply route in the morning, at noon, and at bedtime. May substitute to any manufacturer covered by patient's insurance. Lendia Boby CROME, NP-C  Active Self  Calcium  Carbonate (CALCIUM  600 PO) [id number]  Take 600 mg by mouth in the morning and at bedtime. [provider]  Active Self  Cholecalciferol (VITAMIN D3) 125 MCG (5000 UT) TABS [id number]  Take 5,000 Units by mouth daily. [provider]  Active Self  digoxin  (LANOXIN ) 0.125 MG tablet [id number]  Take 0.5 tablets (0.0625 mg total) by mouth daily. Mallipeddi,  Vishnu P, MD  Active   fexofenadine (ALLEGRA) 180 MG tablet [id number]  Take 180 mg by mouth daily as needed for allergies or rhinitis. [provider]  Active Self  glucose blood (ACCU-CHEK GUIDE TEST) test strip [id number]   Use to check blood sugars 2x daily Henson, [name], NP-C  Active   lidocaine  4 % [id number]  Place 1 patch onto the skin as needed (pain.). [provider]  Active Self  lisinopril  (ZESTRIL ) 2.5 MG tablet [id number]  TAKE 1 TABLET BY MOUTH EVERY DAY Henson, [name], NP-C  Active   loperamide (IMODIUM) 2 MG capsule [id number]  Take 2-4 mg by mouth 4 (four) times daily as needed for diarrhea or loose stools. [provider]  Active Self  MAGNESIUM  PO [id number]  Take 500 mg by mouth in the morning and at bedtime. [provider]  Active Self  metFORMIN  (GLUCOPHAGE ) 500 MG tablet [id number]  TAKE 2 TABLETS BY MOUTH AT BREAKFAST AND 1 TABLET EVERY EVENING WITH MEAL DAILY Henson, [name], NP-C  Active   metoprolol  tartrate (LOPRESSOR ) 50 MG tablet [id number]  TAKE 1 TABLET BY MOUTH TWICE A DAY Henson, [name], NP-C  Active   mirtazapine  (REMERON ) 15 MG tablet [id number]  TAKE 1 TABLET (15 MG TOTAL) BY MOUTH AT BEDTIME. TAKE 1 TABLET(15 MG) BY MOUTH AT BEDTIME Henson, [name], NP-C  Active   moxifloxacin (VIGAMOX) 0.5 % ophthalmic solution [id number]  Place 1 drop into the right eye 4 (four) times daily. [provider]  Active   nitrofurantoin, macrocrystal-monohydrate, (MACROBID) 100 MG capsule [id number]  Take 100 mg by mouth 2 (two) times daily. [provider]  Active   Omega-3 Fatty Acids (FISH OIL) 1200 MG CAPS [id number]  Take 1,200 mg by mouth in the morning, at noon, and at bedtime. [provider]  Active Self  POTASSIUM PO [id number]  Take 595 mg by mouth in the morning and at bedtime. [provider]  Active Self  prednisoLONE  acetate (PRED FORTE ) 1 % ophthalmic suspension [id number]  Place 1 drop into the right eye 4 (four) times daily. [provider]  Active   Propylene Glycol (SYSTANE COMPLETE) 0.6 % SOLN [id number]  Place 1 drop into both eyes See admin instructions. Instill one drop into both eyes scheduled twice daily, may use  as needed for dry eyes [provider]  Active Self  rosuvastatin (CRESTOR) 5 MG tablet [id number]  TAKE 1 TABLET BY MOUTH EVERY DAY Henson, [name], NP-C  Active   Specialty Vitamins Products (MENOPAUSE SUPPORT PO) [id number]  Take 1 tablet by mouth daily.  Gynovite Plus - Menopause Supplement for Women [provider]  Active Self  tiZANidine  (ZANAFLEX ) 4 MG tablet [id number]  TAKE 1 TABLET (4 MG TOTAL) BY MOUTH EVERY 8 (EIGHT) HOURS AS NEEDED FOR MUSCLE SPASMS Vernetta Lonni GRADE, MD  Active   Turmeric (QC TUMERIC COMPLEX PO) [id number]  Take 1,000 mg by mouth in the morning and at bedtime. [provider]  Active Self           Home Care and Equipment/Supplies: Were Home Health Services Ordered?:  (unable to assess: first call to patient was dropped and subsequent attempts x 2 unsuccessful) Any new equipment or medical supplies ordered?:  (unable to assess: first call to patient was dropped and subsequent attempts x 2 unsuccessful)  Functional Questionnaire: Do you need assistance with bathing/showering or dressing?:  (unable to assess: first call to patient was dropped and subsequent  attempts x 2 unsuccessful) Do you need assistance with meal preparation?:  (unable to assess: first call to patient was dropped and subsequent attempts x 2 unsuccessful) Do you need assistance with eating?:  (unable to assess: first call to patient was dropped and subsequent attempts x 2 unsuccessful) Do you have difficulty maintaining continence:  (unable to assess: first call to patient was dropped and subsequent attempts x 2 unsuccessful) Do you need assistance with getting out of bed/getting out of a chair/moving?:  (unable to assess: first call to patient was dropped and subsequent attempts x 2 unsuccessful) Do you have difficulty managing or taking your medications?:  (unable to assess: first call to patient was dropped and subsequent attempts x 2 unsuccessful)  Follow up  appointments reviewed: PCP Follow-up appointment confirmed?: Yes Date of PCP follow-up appointment?: 02/02/24 Follow-up Provider: PCP- [facility] Follow-up appointment confirmed?:  (unable to assess: first call to patient was dropped and subsequent attempts x 2 unsuccessful) Do you need transportation to your follow-up appointment?: No Do you understand care options if your condition(s) worsen?: Yes-patient verbalized understanding  SDOH Interventions Today    Flowsheet Row Most Recent Value  SDOH Interventions   Food Insecurity Interventions Patient Unable to Answer  [unable to assess: first call to patient was dropped and subsequent attempts x 2 unsuccessful]  Housing Interventions Patient Unable to Answer  [unable to assess: first call to patient was dropped and subsequent attempts x 2 unsuccessful]  Transportation Interventions Intervention Not Indicated  [Reports ex-spouse provides transportation]  Utilities Interventions Patient Unable to Answer  [unable to assess: first call to patient was dropped and subsequent attempts x 2 unsuccessful]   See TOC assessment tabs for additional assessment/ TOC intervention information  Unable to fully assess/ complete TOC outreach: first call to patient was dropped and subsequent immediate re-attempts x 2 both unsuccessful    Pls call/ message for questions,  Tavia Stave Mckinney Makya Yurko, RN, BSN, Media Planner  Transitions of Care  [facility] [phone]: direct office

## 2024-01-29 NOTE — Telephone Encounter (Signed)
 ATC pt, unable to advise until appt as we need to see the wound. Pt just needs to keep wound clean and dry at this time.

## 2024-01-29 NOTE — Telephone Encounter (Signed)
 FYI Only or Action Required?: FYI only for provider: appointment scheduled on 02/01/24.  Patient was last seen in primary care on 01/09/2024 by Geofm Glade PARAS, MD.  Called Nurse Triage reporting Diarrhea.  Symptoms began several weeks ago.  Interventions attempted: OTC medications: imodium and Rest, hydration, or home remedies.  Symptoms are: unchanged.  Triage Disposition: See PCP When Office is Open (Within 3 Days)  Patient/caregiver understands and will follow disposition?: Yes   Copied from CRM #[id number]. Topic: Clinical - Red Word Triage >> Jan 29, 2024  3:30 PM Jayma L wrote: Red Word that prompted transfer to Nurse Triage: 88 was blood sugar, felt weak and fatigue and diaherra Reason for Disposition  [1] MILD diarrhea (e.g., 1-3 or more stools than normal in past 24 hours) AND [2] present >  7 days  (Exception: Chronic diarrhea that is not worse.)  Answer Assessment - Initial Assessment Questions Additional info:  D/C hospital 01/27/24.   1. DIARRHEA SEVERITY: How bad is the diarrhea? How many more stools have you had in the past 24 hours than normal?      One week 2. ONSET: When did the diarrhea begin?      One week 3. STOOL DESCRIPTION:  How loose or watery is the diarrhea? What is the stool color? Is there any blood or mucous in the stool?     Loose-mush yellowish, incontinent at times 4. VOMITING: Are you also vomiting? If Yes, ask: How many times in the past 24 hours?      denies 5. ABDOMEN PAIN: Are you having any abdomen pain? If Yes, ask: What does it feel like? (e.g., crampy, dull, intermittent, constant)      During bm-imodium helpful-couple days 6. ABDOMEN PAIN SEVERITY: If present, ask: How bad is the pain?  (e.g., Scale 1-10; mild, moderate, or severe)      7. ORAL INTAKE: If vomiting, Have you been able to drink liquids? How much liquids have you had in the past 24 hours?     Drinking well 8. HYDRATION: Any signs of dehydration?  (e.g., dry mouth [not just dry lips], too weak to stand, dizziness, new weight loss) When did you last urinate?     fatigue 9. EXPOSURE: Have you traveled to a foreign country recently? Have you been exposed to anyone with diarrhea? Could you have eaten any food that was spoiled?      10. ANTIBIOTIC USE: Are you taking antibiotics now or have you taken antibiotics in the past 2 months?        11. OTHER SYMPTOMS: Do you have any other symptoms? (e.g., fever, blood in stool)       Fall one week ago [facility] suspected hypotension 01/20/24  Protocols used: [facility]

## 2024-02-01 ENCOUNTER — Inpatient Hospital Stay: Admitting: Family Medicine

## 2024-02-01 NOTE — Progress Notes (Deleted)
° °  Acute Office Visit  Subjective:     Patient ID: Nicole [name], female    DOB: 11-20-1950, 73 y.o.   MRN: [id number]  No chief complaint on file.   HPI  Discussed the use of AI scribe software for clinical note transcription with the patient, who gave verbal consent to proceed.  History of Present Illness      ROS Per HPI      Objective:    There were no vitals taken for this visit.   Physical Exam Vitals and nursing note reviewed.  Constitutional:      General: She is not in acute distress.    Appearance: Normal appearance. She is normal weight.  HENT:     Head: Normocephalic and atraumatic.     Right Ear: External ear normal.     Left Ear: External ear normal.     Nose: Nose normal.     Mouth/Throat:     Mouth: Mucous membranes are moist.     Pharynx: Oropharynx is clear.  Eyes:     Extraocular Movements: Extraocular movements intact.     Pupils: Pupils are equal, round, and reactive to light.  Cardiovascular:     Rate and Rhythm: Normal rate and regular rhythm.     Pulses: Normal pulses.     Heart sounds: Normal heart sounds.  Pulmonary:     Effort: Pulmonary effort is normal. No respiratory distress.     Breath sounds: Normal breath sounds. No wheezing, rhonchi or rales.  Musculoskeletal:        General: Normal range of motion.     Cervical back: Normal range of motion.     Right lower leg: No edema.     Left lower leg: No edema.  Lymphadenopathy:     Cervical: No cervical adenopathy.  Neurological:     General: No focal deficit present.     Mental Status: She is alert and oriented to person, place, and time.  Psychiatric:        Mood and Affect: Mood normal.        Thought Content: Thought content normal.     No results found for any visits on 02/01/24.      Assessment & Plan:   Assessment and Plan Assessment & Plan      No orders of the defined types were placed in this encounter.    No orders of the defined types were  placed in this encounter.   No follow-ups on file.  Corean LITTIE Ku, FNP

## 2024-02-02 ENCOUNTER — Inpatient Hospital Stay: Admitting: Family Medicine

## 2024-02-05 ENCOUNTER — Encounter: Payer: Self-pay | Admitting: Family Medicine

## 2024-02-05 ENCOUNTER — Ambulatory Visit: Admitting: Family Medicine

## 2024-02-05 VITALS — BP 138/104 | HR 90 | Temp 98.8°F | Ht 67.0 in | Wt 169.2 lb

## 2024-02-05 DIAGNOSIS — L03115 Cellulitis of right lower limb: Secondary | ICD-10-CM

## 2024-02-05 DIAGNOSIS — I4819 Other persistent atrial fibrillation: Secondary | ICD-10-CM | POA: Diagnosis not present

## 2024-02-05 DIAGNOSIS — S71111D Laceration without foreign body, right thigh, subsequent encounter: Secondary | ICD-10-CM | POA: Diagnosis not present

## 2024-02-05 DIAGNOSIS — E875 Hyperkalemia: Secondary | ICD-10-CM | POA: Diagnosis not present

## 2024-02-05 DIAGNOSIS — D6859 Other primary thrombophilia: Secondary | ICD-10-CM

## 2024-02-05 DIAGNOSIS — Z4802 Encounter for removal of sutures: Secondary | ICD-10-CM | POA: Diagnosis not present

## 2024-02-05 DIAGNOSIS — Z7901 Long term (current) use of anticoagulants: Secondary | ICD-10-CM

## 2024-02-05 DIAGNOSIS — T148XXA Other injury of unspecified body region, initial encounter: Secondary | ICD-10-CM

## 2024-02-05 LAB — CBC WITH DIFFERENTIAL/PLATELET
Basophils Absolute: 0.1 K/uL (ref 0.0–0.1)
Basophils Relative: 0.9 % (ref 0.0–3.0)
Eosinophils Absolute: 0.2 K/uL (ref 0.0–0.7)
Eosinophils Relative: 2.3 % (ref 0.0–5.0)
HCT: 33.5 % — ABNORMAL LOW (ref 36.0–46.0)
Hemoglobin: 11 g/dL — ABNORMAL LOW (ref 12.0–15.0)
Lymphocytes Relative: 13.6 % (ref 12.0–46.0)
Lymphs Abs: 0.9 K/uL (ref 0.7–4.0)
MCHC: 32.7 g/dL (ref 30.0–36.0)
MCV: 92.8 fl (ref 78.0–100.0)
Monocytes Absolute: 0.6 K/uL (ref 0.1–1.0)
Monocytes Relative: 8.8 % (ref 3.0–12.0)
Neutro Abs: 5 K/uL (ref 1.4–7.7)
Neutrophils Relative %: 74.4 % (ref 43.0–77.0)
Platelets: 254 K/uL (ref 150.0–400.0)
RBC: 3.61 Mil/uL — ABNORMAL LOW (ref 3.87–5.11)
RDW: 18.1 % — ABNORMAL HIGH (ref 11.5–15.5)
WBC: 6.7 K/uL (ref 4.0–10.5)

## 2024-02-05 LAB — COMPREHENSIVE METABOLIC PANEL WITH GFR
ALT: 10 U/L (ref 3–35)
AST: 19 U/L (ref 5–37)
Albumin: 4.1 g/dL (ref 3.5–5.2)
Alkaline Phosphatase: 94 U/L (ref 39–117)
BUN: 32 mg/dL — ABNORMAL HIGH (ref 6–23)
CO2: 20 meq/L (ref 19–32)
Calcium: 9.1 mg/dL (ref 8.4–10.5)
Chloride: 107 meq/L (ref 96–112)
Creatinine, Ser: 1.1 mg/dL (ref 0.40–1.20)
GFR: 49.34 mL/min — ABNORMAL LOW
Glucose, Bld: 98 mg/dL (ref 70–99)
Potassium: 4.9 meq/L (ref 3.5–5.1)
Sodium: 135 meq/L (ref 135–145)
Total Bilirubin: 0.7 mg/dL (ref 0.2–1.2)
Total Protein: 6.9 g/dL (ref 6.0–8.3)

## 2024-02-05 MED ORDER — AMOXICILLIN-POT CLAVULANATE 875-125 MG PO TABS: 1.0000 | ORAL_TABLET | Freq: Two times a day (BID) | ORAL | 0 refills | Status: AC

## 2024-02-05 NOTE — Progress Notes (Signed)
 "  Acute Office Visit  Subjective:     Patient ID: Nicole [name], female    DOB: 07-31-1950, 73 y.o.   MRN: [id number]  Chief Complaint  Patient presents with   Hospitalization Follow-up    [facility] 12/12 rt leg still swollen at stiches sight muscle relaxant not helping taking zanaflex  and tylenol      HPI  Discussed the use of AI scribe software for clinical note transcription with the patient, who gave verbal consent to proceed.  History of Present Illness Nicole [name] is a 73 year old female who presents with persistent leg swelling and pain following a recent fall and drainage procedure.  Lower extremity swelling and pain - Persistent hard, painful, throbbing swelling of the leg since fall and drainage with stitches on January 20, 2024 - Pain is worse at night and interferes with sleep - Swelling has not improved since the procedure - Swelling was previously hot and tender prior to hospital visit - Swelling has not significantly improved with current regimen of antibiotics four times daily and Zanaflex  4 mg at night for pain  Recent fall and injury - Tripped over a walker, resulting in initial leg injury - Underwent drainage procedure with stitches on January 20, 2024  Anticoagulation and blood pressure changes - Was taking Eliquis , which was stopped after a marked blood pressure drop - Restarted Eliquis  at home, experienced another blood pressure drop - Presented to ER for evaluation; vitals and EKG were checked - Eliquis  was discontinued again per ER recommendation  Electrolyte abnormality - Had elevated potassium requiring treatment with oral medication - Oral potassium-lowering medication caused diarrhea, which has resolved  Medication allergies - Allergic to contrast dye - No known antibiotic allergies     ROS Per HPI      Objective:    BP (!) 138/104 (BP Location: Left Arm, Patient Position: Sitting)   Pulse 90   Temp 98.8 F (37.1 C)  (Temporal)   Ht 5' 7 (1.702 m)   Wt 169 lb 3.2 oz (76.7 kg)   SpO2 96%   BMI 26.50 kg/m    Physical Exam Vitals and nursing note reviewed.  Constitutional:      General: She is not in acute distress.    Appearance: Normal appearance. She is normal weight.  HENT:     Head: Normocephalic and atraumatic.     Right Ear: External ear normal.     Left Ear: External ear normal.     Nose: Nose normal.     Mouth/Throat:     Mouth: Mucous membranes are moist.     Pharynx: Oropharynx is clear.  Eyes:     Extraocular Movements: Extraocular movements intact.     Pupils: Pupils are equal, round, and reactive to light.  Cardiovascular:     Rate and Rhythm: Normal rate and regular rhythm.     Pulses: Normal pulses.     Heart sounds: Normal heart sounds.  Pulmonary:     Effort: Pulmonary effort is normal. No respiratory distress.     Breath sounds: Normal breath sounds. No wheezing, rhonchi or rales.  Musculoskeletal:     Cervical back: Normal range of motion.     Right lower leg: No edema.     Left lower leg: No edema.     Comments: RLE swollen, tender, erythematous, see photo  Lymphadenopathy:     Cervical: No cervical adenopathy.  Skin:    Comments: See photo of lateral R thigh  Neurological:  General: No focal deficit present.     Mental Status: She is alert and oriented to person, place, and time.  Psychiatric:        Mood and Affect: Mood normal.        Thought Content: Thought content normal.     Suture Removal Location: R thigh # of sutures: 3 Removed with: forceps and scissors Assessment: incision is healing well, no discharge or bleeding noted, see above photo Patient tolerated well with no immediate complication   Results for orders placed or performed in visit on 02/05/24  CBC w/Diff  Result Value Ref Range   WBC 6.7 4.0 - 10.5 K/uL   RBC 3.61 (L) 3.87 - 5.11 Mil/uL   Hemoglobin 11.0 (L) 12.0 - 15.0 g/dL   HCT 66.4 (L) 63.9 - 53.9 %   MCV 92.8 78.0 -  100.0 fl   MCHC 32.7 30.0 - 36.0 g/dL   RDW 81.8 (H) 88.4 - 84.4 %   Platelets 254.0 150.0 - 400.0 K/uL   Neutrophils Relative % 74.4 43.0 - 77.0 %   Lymphocytes Relative 13.6 12.0 - 46.0 %   Monocytes Relative 8.8 3.0 - 12.0 %   Eosinophils Relative 2.3 0.0 - 5.0 %   Basophils Relative 0.9 0.0 - 3.0 %   Neutro Abs 5.0 1.4 - 7.7 K/uL   Lymphs Abs 0.9 0.7 - 4.0 K/uL   Monocytes Absolute 0.6 0.1 - 1.0 K/uL   Eosinophils Absolute 0.2 0.0 - 0.7 K/uL   Basophils Absolute 0.1 0.0 - 0.1 K/uL  Comp Met (CMET)  Result Value Ref Range   Sodium 135 135 - 145 mEq/L   Potassium 4.9 3.5 - 5.1 mEq/L   Chloride 107 96 - 112 mEq/L   CO2 20 19 - 32 mEq/L   Glucose, Bld 98 70 - 99 mg/dL   BUN 32 (H) 6 - 23 mg/dL   Creatinine, Ser 8.89 0.40 - 1.20 mg/dL   Total Bilirubin 0.7 0.2 - 1.2 mg/dL   Alkaline Phosphatase 94 39 - 117 U/L   AST 19 5 - 37 U/L   ALT 10 3 - 35 U/L   Total Protein 6.9 6.0 - 8.3 g/dL   Albumin  4.1 3.5 - 5.2 g/dL   GFR 50.65 (L) >39.99 mL/min   Calcium  9.1 8.4 - 10.5 mg/dL        Assessment & Plan:   Assessment and Plan Assessment & Plan Cellulitis of right lower extremity Persistent symptoms with ineffective Keflex and concerns about bleeding due to anticoagulation. - Initiated Augmentin  for broader coverage. - Checked hemoglobin for bleeding. - Suture removal today  Persistent A fib, chronic anticoagulation, secondary hypercoagulable state Anticoagulation management with recent hypotension Hypotension after Eliquis  resumption with high potassium levels. Risk of exacerbated hypotension and bleeding. - Chronic, a fib stable - Held Eliquis  pending hemoglobin review. - Checked blood pressure and heart rate. - Reassess anticoagulation based on labs.  Hyperkalemia, recently treated Hyperkalemia managed with lactulose , causing diarrhea. Caution with anticoagulation due to interactions. - CMP today  Hematoma - sutures removed from hematoma I&D in hospital on  01/20/24 - Avoid Eliquis  until hematoma stabilizes     Orders Placed This Encounter  Procedures   CBC w/Diff   Comp Met (CMET)   Suture removal     Meds ordered this encounter  Medications   amoxicillin -clavulanate (AUGMENTIN ) 875-125 MG tablet    Sig: Take 1 tablet by mouth 2 (two) times daily for 7 days.    Dispense:  14 tablet    Refill:  0    Return for Wednesday with Orie.  Corean LITTIE Ku, FNP  "

## 2024-02-06 ENCOUNTER — Ambulatory Visit: Payer: Self-pay | Admitting: Family Medicine

## 2024-02-07 ENCOUNTER — Ambulatory Visit: Admitting: Family Medicine

## 2024-02-07 ENCOUNTER — Ambulatory Visit: Payer: Self-pay | Admitting: Family Medicine

## 2024-02-07 ENCOUNTER — Other Ambulatory Visit: Payer: Self-pay | Admitting: Orthopaedic Surgery

## 2024-02-07 ENCOUNTER — Encounter: Payer: Self-pay | Admitting: Family Medicine

## 2024-02-07 VITALS — BP 160/90 | HR 84 | Temp 98.6°F | Ht 67.0 in | Wt 168.0 lb

## 2024-02-07 DIAGNOSIS — Z7901 Long term (current) use of anticoagulants: Secondary | ICD-10-CM | POA: Diagnosis not present

## 2024-02-07 DIAGNOSIS — L02415 Cutaneous abscess of right lower limb: Secondary | ICD-10-CM | POA: Diagnosis not present

## 2024-02-07 DIAGNOSIS — T148XXA Other injury of unspecified body region, initial encounter: Secondary | ICD-10-CM

## 2024-02-07 DIAGNOSIS — I4821 Permanent atrial fibrillation: Secondary | ICD-10-CM | POA: Diagnosis not present

## 2024-02-07 DIAGNOSIS — L03115 Cellulitis of right lower limb: Secondary | ICD-10-CM

## 2024-02-07 LAB — CBC WITH DIFFERENTIAL/PLATELET
Basophils Absolute: 0 x10E3/uL (ref 0.0–0.2)
Basos: 1 %
EOS (ABSOLUTE): 0.2 x10E3/uL (ref 0.0–0.4)
Eos: 3 %
Hematocrit: 34.4 % (ref 34.0–46.6)
Hemoglobin: 10.8 g/dL — ABNORMAL LOW (ref 11.1–15.9)
Immature Grans (Abs): 0 x10E3/uL (ref 0.0–0.1)
Immature Granulocytes: 0 %
Lymphocytes Absolute: 0.5 x10E3/uL — ABNORMAL LOW (ref 0.7–3.1)
Lymphs: 8 %
MCH: 29.8 pg (ref 26.6–33.0)
MCHC: 31.4 g/dL — ABNORMAL LOW (ref 31.5–35.7)
MCV: 95 fL (ref 79–97)
Monocytes Absolute: 0.4 x10E3/uL (ref 0.1–0.9)
Monocytes: 6 %
Neutrophils Absolute: 5.2 x10E3/uL (ref 1.4–7.0)
Neutrophils: 82 %
Platelets: 239 x10E3/uL (ref 150–450)
RBC: 3.62 x10E6/uL — ABNORMAL LOW (ref 3.77–5.28)
RDW: 15.2 % (ref 11.7–15.4)
WBC: 6.3 x10E3/uL (ref 3.4–10.8)

## 2024-02-07 LAB — BASIC METABOLIC PANEL WITH GFR
BUN/Creatinine Ratio: 24 (ref 12–28)
BUN: 25 mg/dL (ref 8–27)
CO2: 18 mmol/L — ABNORMAL LOW (ref 20–29)
Calcium: 8.6 mg/dL — ABNORMAL LOW (ref 8.7–10.3)
Chloride: 105 mmol/L (ref 96–106)
Creatinine, Ser: 1.06 mg/dL — ABNORMAL HIGH (ref 0.57–1.00)
Glucose: 106 mg/dL — ABNORMAL HIGH (ref 70–99)
Potassium: 5.2 mmol/L (ref 3.5–5.2)
Sodium: 136 mmol/L (ref 134–144)
eGFR: 55 mL/min/1.73 — ABNORMAL LOW

## 2024-02-07 LAB — SEDIMENTATION RATE: Sed Rate: 29 mm/h (ref 0–40)

## 2024-02-07 LAB — C-REACTIVE PROTEIN: CRP: 22 mg/L — ABNORMAL HIGH (ref 0–10)

## 2024-02-07 MED ORDER — HYDROCODONE-ACETAMINOPHEN 5-325 MG PO TABS: 1.0000 | ORAL_TABLET | Freq: Three times a day (TID) | ORAL | 0 refills | Status: AC | PRN

## 2024-02-07 MED ORDER — DOXYCYCLINE HYCLATE 100 MG PO TABS: 100.0000 mg | ORAL_TABLET | Freq: Two times a day (BID) | ORAL | 0 refills | Status: DC

## 2024-02-07 NOTE — Patient Instructions (Signed)
 Please go downstairs for labs before you leave.  You will receive a phone call to schedule your MRI today hopefully  If your MRI is not able to be done today then you will most likely need to go to the emergency department  Start doxycycline .  Continue Augmentin .  Continue to hold your Eliquis   I prescribed hydrocodone -acetaminophen  for pain.  Take this for moderate to severe pain.  Do not take tizanidine  (Zanaflex ) with this medication.  Also, be sure you are not taking more than 3000 mg of Tylenol  in a 24-hour.  If you develop fever, chills, nausea, vomiting, worsening swelling or pain, you will need to call 911 or go to the emergency department

## 2024-02-07 NOTE — Progress Notes (Signed)
 "  Subjective:     Patient ID: Nicole [name], female    DOB: 10/31/1950, 73 y.o.   MRN: [id number]  Chief Complaint  Patient presents with   Follow-up    Rt leg still swollen BP this morning 154/93 Has triple antibiotic ointment on wounds     HPI  Discussed the use of AI scribe software for clinical note transcription with the patient, who gave verbal consent to proceed.  History of Present Illness Nicole [name] is a 73 year old female who presents with a wound on her right upper leg.  Right upper leg wound and cellulitis - Sustained right upper leg wound after tripping over a walker -Hospitalized for shock and hematoma in early December,  I&D done - Wound required drainage and sutures; sutures removed two days ago - Started on Augmentin  for cellulitis - Persistent pain and swelling at wound site - Throbbing pain radiates to the back of the leg, rated 7/10 - Bruising has improved, but swelling persists - Band-Aid causes local irritation and peeling skin - Uses topical cream for skin irritation from bandage tape  Pain management - Takes Tylenol  for pain without relief - Uses Zanaflex  2 mg during the day without issues - Has a 4 mg Zanaflex  prescription that may be split for additional pain control - Cannot take NSAIDs  Anticoagulation and cardiovascular symptoms - History of atrial fibrillation - Previously on Eliquis , stopped due to hypotension - Restarted Eliquis , but stopped again two days ago after another significant blood pressure drop and ER visit - Shortness of breath with activity - No chest pain, fever, chills, nausea, or vomiting     Estimated Creatinine Clearance: 47.7 mL/min (by C-G formula based on SCr of 1.1 mg/dL).     Health Maintenance Due  Topic Date Due   COVID-19 Vaccine (1) Never done   FOOT EXAM  Never done   Zoster Vaccines- Shingrix (1 of 2) Never done   Mammogram  Never done   Colonoscopy  Never done   Pneumococcal  Vaccine: 50+ Years (2 of 2 - PCV) 01/05/2018   Influenza Vaccine  09/15/2023   HEMOGLOBIN A1C  12/21/2023    Past Medical History:  Diagnosis Date   Anemia    Arthritis    Asthma    as a child   Atrial fibrillation with RVR (HCC)    Cancer (HCC)    right kidney   CHF (congestive heart failure) (HCC)    Chronic alcoholism (HCC)    Chronic kidney disease    has solitary kidney ( right nephrectomy for cancer)   Chronic left shoulder pain    Depression    Diabetes mellitus without complication (HCC)    Dysrhythmia    Atrial Fib with RVR    GERD (gastroesophageal reflux disease)    Heartburn    History of blood transfusion    History of hiatal hernia    Hypertension    Liver cirrhosis, alcoholic (HCC)    Memory loss    Schizophrenia (HCC)    patient denies having   Seizures (HCC)    last 2024, no meds and she refuses to see neurology   Thyroid  nodule     Past Surgical History:  Procedure Laterality Date   ANKLE FRACTURE SURGERY Right 2008   screws   ANKLE FRACTURE SURGERY Left 2008   plates   ESOPHAGOGASTRODUODENOSCOPY (EGD) WITH PROPOFOL  N/A 12/18/2016   Procedure: ESOPHAGOGASTRODUODENOSCOPY (EGD) WITH PROPOFOL ;  Surgeon: Dianna Specking, MD;  Location: [facility];  Service: Endoscopy;  Laterality: N/A;   JOINT REPLACEMENT Left    total knee arthroplasty done at [facility], can't remember when it was   NASAL SINUS SURGERY Left 11/17/2017   Procedure: ENDOSCOPIC SINUS approach;  Surgeon: Carlie Clark, MD;  Location: [facility] OR;  Service: ENT;  Laterality: Left;   NEPHRECTOMY Right    robotic right radical  for cancer.   ORIF ORBITAL FRACTURE Left 11/17/2017   Procedure: Trans Antral Left sided orbital floor fracture repair with dissovable floor implant;  Surgeon: Carlie Clark, MD;  Location: [facility] OR;  Service: ENT;  Laterality: Left;   TONSILLECTOMY     TOTAL HIP ARTHROPLASTY Left 06/23/2023   Procedure: ARTHROPLASTY, HIP, TOTAL, ANTERIOR APPROACH;  Surgeon: Vernetta Lonni GRADE, MD;  Location: [facility] ORS;  Service: Orthopedics;  Laterality: Left;    Family History  Problem Relation Age of Onset   Stroke Mother    Heart attack Father    Ulcers Father    Diabetes Sister    Cancer Brother    Diabetes Sister    Diabetes Sister     Social History   Socioeconomic History   Marital status: Divorced    Spouse name: Not on file   Number of children: Not on file   Years of education: Not on file   Highest education level: Associate degree: academic program  Occupational History   Not on file  Tobacco Use   Smoking status: Former    Current packs/day: 0.00    Average packs/day: 1 pack/day for 15.0 years (15.0 ttl pk-yrs)    Types: Cigarettes    Start date: 11/13/1992    Quit date: 11/14/2007    Years since quitting: 16.2   Smokeless tobacco: Never  Vaping Use   Vaping status: Never Used  Substance and Sexual Activity   Alcohol  use: Not Currently    Comment: none since fall 11/13/17   Drug use: No   Sexual activity: Not Currently  Other Topics Concern   Not on file  Social History Narrative   quit drinking in late August but went on a binge over last weekend (october 26, 26, 28).  Hasn't drank since Sunday (6 days ago) due to abd pain, N, V   Social Drivers of Health   Tobacco Use: Medium Risk (02/07/2024)   Patient History    Smoking Tobacco Use: Former    Smokeless Tobacco Use: Never    Passive Exposure: Not on file  Financial Resource Strain: Low Risk (01/19/2024)   Received from [facility]   Overall Financial Resource Strain (CARDIA)    How hard is it for you to pay for the very basics like food, housing, medical care, and heating?: Not hard at all  Food Insecurity: Patient Unable To Answer (01/29/2024)   Epic    Worried About Programme Researcher, Broadcasting/film/video in the Last Year: Patient unable to answer    Ran Out of Food in the Last Year: Patient unable to answer  Recent Concern: Food Insecurity - Food Insecurity Present (01/08/2024)   Epic     Worried About Programme Researcher, Broadcasting/film/video in the Last Year: Sometimes true    [employer] of Food in the Last Year: Never true  Transportation Needs: No Transportation Needs (01/29/2024)   Epic    Lack of Transportation (Medical): No    Lack of Transportation (Non-Medical): No  Physical Activity: Insufficiently Active (01/19/2024)   Received from [facility]   Exercise Vital  Sign    On average, how many days per week do you engage in moderate to strenuous exercise (like a brisk walk)?: 3 days    On average, how many minutes do you engage in exercise at this level?: 30 min  Stress: Stress Concern Present (01/19/2024)   Received from [facility] - Occupational Stress Questionnaire    Do you feel stress - tense, restless, nervous, or anxious, or unable to sleep at night because your mind is troubled all the time - these days?: To some extent  Social Connections: Moderately Isolated (01/19/2024)   Received from [facility]   Social Connection and Isolation Panel    In a typical week, how many times do you talk on the phone with family, friends, or neighbors?: Twice a week    How often do you get together with friends or relatives?: Twice a week    How often do you attend church or religious services?: 1 to 4 times per year    Do you belong to any clubs or organizations such as church groups, unions, fraternal or athletic groups, or school groups?: No    How often do you attend meetings of the clubs or organizations you belong to?: Never    Are you married, widowed, divorced, separated, never married, or living with a partner?: Divorced  Intimate Partner Violence: Patient Unable To Answer (01/29/2024)   Epic    Fear of Current or Ex-Partner: Patient unable to answer    Emotionally Abused: Patient unable to answer    Physically Abused: Patient unable to answer    Sexually Abused: Patient unable to answer  Depression (PHQ2-9): Medium Risk (08/16/2023)    Depression (PHQ2-9)    PHQ-2 Score: 6  Alcohol  Screen: Low Risk (08/16/2023)   Alcohol  Screen    Last Alcohol  Screening Score (AUDIT): 0  Housing: Patient Unable To Answer (01/29/2024)   Epic    Unable to Pay for Housing in the Last Year: Patient unable to answer    Number of Times Moved in the Last Year: Not on file    Homeless in the Last Year: Patient unable to answer  Recent Concern: Housing - High Risk (01/08/2024)   Epic    Unable to Pay for Housing in the Last Year: Yes    Number of Times Moved in the Last Year: 0    Homeless in the Last Year: No  Utilities: Patient Unable To Answer (01/29/2024)   Epic    Threatened with loss of utilities: Patient unable to answer  Health Literacy: Medium Risk (01/19/2024)   Received from [facility]    How often do you need to have someone help you when you read instructions, pamphlets, or other written material from your doctor or pharmacy?: Sometimes    Outpatient Medications Prior to Visit  Medication Sig Dispense Refill   Accu-Chek Softclix Lancets lancets USE ONE LANCET IN THE MORNING, AT NOON, AND AT BEDTIME 300 each 3   acetaminophen  (TYLENOL ) 500 MG tablet Take 500 mg by mouth 2 (two) times daily.     Ascorbic Acid  (VITAMIN C ) 1000 MG tablet Take 1,000 mg by mouth in the morning.     b complex vitamins capsule Take 1 capsule by mouth daily.     Blood Glucose Monitoring Suppl DEVI 1 each by Does not apply route in the morning, at noon, and at bedtime. May substitute to any manufacturer covered  by patient's insurance. 1 each 0   Calcium  Carbonate (CALCIUM  600 PO) Take 600 mg by mouth in the morning and at bedtime.     Cholecalciferol (VITAMIN D3) 125 MCG (5000 UT) TABS Take 5,000 Units by mouth daily.     digoxin  (LANOXIN ) 0.125 MG tablet Take 0.5 tablets (0.0625 mg total) by mouth daily. 45 tablet 0   fexofenadine (ALLEGRA) 180 MG tablet Take 180 mg by mouth daily as needed for allergies or rhinitis.     glucose  blood (ACCU-CHEK GUIDE TEST) test strip Use to check blood sugars 2x daily 300 strip 3   lidocaine  4 % Place 1 patch onto the skin as needed (pain.).     lisinopril  (ZESTRIL ) 2.5 MG tablet TAKE 1 TABLET BY MOUTH EVERY DAY 90 tablet 1   loperamide (IMODIUM) 2 MG capsule Take 2-4 mg by mouth 4 (four) times daily as needed for diarrhea or loose stools.     MAGNESIUM  PO Take 500 mg by mouth in the morning and at bedtime.     metFORMIN  (GLUCOPHAGE ) 500 MG tablet TAKE 2 TABLETS BY MOUTH AT BREAKFAST AND 1 TABLET EVERY EVENING WITH MEAL DAILY 270 tablet 1   metoprolol  tartrate (LOPRESSOR ) 50 MG tablet TAKE 1 TABLET BY MOUTH TWICE A DAY 180 tablet 1   mirtazapine  (REMERON ) 15 MG tablet TAKE 1 TABLET (15 MG TOTAL) BY MOUTH AT BEDTIME. TAKE 1 TABLET(15 MG) BY MOUTH AT BEDTIME 90 tablet 1   Omega-3 Fatty Acids (FISH OIL) 1200 MG CAPS Take 1,200 mg by mouth in the morning, at noon, and at bedtime.     prednisoLONE  acetate (PRED FORTE ) 1 % ophthalmic suspension Place 1 drop into the right eye 4 (four) times daily.     Propylene Glycol (SYSTANE COMPLETE) 0.6 % SOLN Place 1 drop into both eyes See admin instructions. Instill one drop into both eyes scheduled twice daily, may use as needed for dry eyes     rosuvastatin (CRESTOR) 5 MG tablet TAKE 1 TABLET BY MOUTH EVERY DAY 90 tablet 1   Specialty Vitamins Products (MENOPAUSE SUPPORT PO) Take 1 tablet by mouth daily.  Gynovite Plus - Menopause Supplement for Women     Turmeric (QC TUMERIC COMPLEX PO) Take 1,000 mg by mouth in the morning and at bedtime.     amoxicillin -clavulanate (AUGMENTIN ) 875-125 MG tablet Take 1 tablet by mouth 2 (two) times daily for 7 days. 14 tablet 0   apixaban  (ELIQUIS ) 5 MG TABS tablet TAKE 1 TABLET BY MOUTH TWICE A DAY (Patient not taking: Reported on 02/05/2024) 180 tablet 1   moxifloxacin (VIGAMOX) 0.5 % ophthalmic solution Place 1 drop into the right eye 4 (four) times daily.     POTASSIUM PO Take 595 mg by mouth in the morning and at  bedtime. (Patient not taking: Reported on 02/05/2024)     tiZANidine  (ZANAFLEX ) 4 MG tablet TAKE 1 TABLET (4 MG TOTAL) BY MOUTH EVERY 8 (EIGHT) HOURS AS NEEDED FOR MUSCLE SPASMS 30 tablet 1   No facility-administered medications prior to visit.    Allergies[1]  ROS     Objective:    Physical Exam Constitutional:      General: She is not in acute distress.    Appearance: She is not ill-appearing.  HENT:     Mouth/Throat:     Mouth: Mucous membranes are moist.     Pharynx: Oropharynx is clear.  Eyes:     Extraocular Movements: Extraocular movements intact.     Conjunctiva/sclera:  Conjunctivae normal.  Cardiovascular:     Rate and Rhythm: Normal rate. Rhythm irregular.  Pulmonary:     Effort: Pulmonary effort is normal.     Breath sounds: Normal breath sounds.  Musculoskeletal:     Cervical back: Normal range of motion and neck supple.     Right upper leg: Swelling and tenderness present.     Comments: Large hematoma of anterior upper thigh with fluctuance and small amount of serosanguineous drainage from port hole (post I&D).  Significant tenderness to palpation Edema of right lower extremity down to her foot, pitting  Skin:    General: Skin is warm and dry.  Neurological:     General: No focal deficit present.     Mental Status: She is alert and oriented to person, place, and time.  Psychiatric:        Mood and Affect: Mood normal.        Behavior: Behavior normal.        Thought Content: Thought content normal.      BP (!) 160/90 (BP Location: Left Arm, Patient Position: Sitting)   Pulse 84   Temp 98.6 F (37 C) (Temporal)   Ht 5' 7 (1.702 m)   Wt 168 lb (76.2 kg)   SpO2 99%   BMI 26.31 kg/m  Wt Readings from Last 3 Encounters:  02/07/24 168 lb (76.2 kg)  02/05/24 169 lb 3.2 oz (76.7 kg)  12/21/23 163 lb (73.9 kg)       Assessment & Plan:   Problem List Items Addressed This Visit     Long term current use of anticoagulant (Chronic)   Permanent  atrial fibrillation (HCC) (Chronic)   Other Visit Diagnoses       Cellulitis and abscess of right leg    -  Primary   Relevant Orders   Wound culture   MR FRMUR RIGHT WO CONTRAST   CBC with Differential/Platelet   Basic metabolic panel with GFR   C-reactive protein   Sedimentation rate     Hematoma       Relevant Orders   Wound culture   MR FRMUR RIGHT WO CONTRAST   CBC with Differential/Platelet   Basic metabolic panel with GFR   C-reactive protein   Sedimentation rate       Assessment and Plan Assessment & Plan Cellulitis and abscess of right leg, rule out osteomyelitis Persistent swelling and pain in the right leg with a history of drainage and suture placement. Bruising has decreased, but swelling persists. Differential includes osteomyelitis and abscess. No fever, chills, or systemic symptoms. Pain rated at 7/10. Recent fall due to inappropriate footwear. Skin irritation from bandage tape noted. - Ordered stat MRI of right femur to rule out osteomyelitis and abscess. - Added doxycycline  to Augmentin  for infection coverage. - Checked stat CBC, BMP, C-reactive protein, and sed rate. - Apply antibiotic ointment and bandage with paper tape. - Prescribed hydrocodone  for pain management. - Hold Zanaflex  if taking hydrocodone . - Monitor blood pressure at home. - If symptoms worsen, go to the emergency department or call 911.  Permanent atrial fibrillation Currently on Eliquis  for atrial fibrillation. Eliquis  was stopped due to hypotension and bleeding risk, and has been held since. Recent episodes of bleeding and significant hypotension reported. - Continue holding Eliquis . - Monitor for any signs of bleeding or hypotension.  Hypertension Blood pressure elevated at 160/90. Previous episodes of hypotension noted. Recent changes in antihypertensive medications. - Monitor blood pressure at home.  I am having Unique B. Mercer Laurence start on doxycycline  and  HYDROcodone -acetaminophen . I am also having her maintain her Propylene Glycol, MAGNESIUM  PO, Turmeric (QC TUMERIC COMPLEX PO), Calcium  Carbonate (CALCIUM  600 PO), b complex vitamins, POTASSIUM PO, lidocaine , loperamide, acetaminophen , Blood Glucose Monitoring Suppl, Accu-Chek Softclix Lancets, Fish Oil, vitamin C , Vitamin D3, fexofenadine, Specialty Vitamins Products (MENOPAUSE SUPPORT PO), rosuvastatin, lisinopril , metoprolol  tartrate, prednisoLONE  acetate, moxifloxacin, metFORMIN , mirtazapine , Eliquis , digoxin , Accu-Chek Guide Test, amoxicillin -clavulanate, and tiZANidine .  Meds ordered this encounter  Medications   doxycycline  (VIBRA -TABS) 100 MG tablet    Sig: Take 1 tablet (100 mg total) by mouth 2 (two) times daily.    Dispense:  20 tablet    Refill:  0    Supervising Provider:   ROLLENE NORRIS A [[id number]]   HYDROcodone -acetaminophen  (NORCO/VICODIN) 5-325 MG tablet    Sig: Take 1 tablet by mouth every 8 (eight) hours as needed for moderate pain (pain score 4-6).    Dispense:  12 tablet    Refill:  0    Supervising Provider:   ROLLENE NORRIS A [[id number]]       [1]  Allergies Allergen Reactions   Other Shortness Of Breath and Anxiety    UNSPECIFIED REACTION to fast foods and processed food like hot dogs   Iodinated Contrast Media Swelling    Hand swelling from contrast dye for MRI   "

## 2024-02-09 ENCOUNTER — Other Ambulatory Visit: Payer: Self-pay

## 2024-02-09 ENCOUNTER — Emergency Department (HOSPITAL_COMMUNITY): Admission: EM | Admit: 2024-02-09 | Discharge: 2024-02-09 | Discharge status: Against Medical Advice

## 2024-02-09 ENCOUNTER — Encounter (HOSPITAL_COMMUNITY): Payer: Self-pay | Admitting: Emergency Medicine

## 2024-02-09 ENCOUNTER — Emergency Department (HOSPITAL_COMMUNITY)

## 2024-02-09 DIAGNOSIS — N189 Chronic kidney disease, unspecified: Secondary | ICD-10-CM | POA: Insufficient documentation

## 2024-02-09 DIAGNOSIS — S7012XD Contusion of left thigh, subsequent encounter: Secondary | ICD-10-CM | POA: Diagnosis present

## 2024-02-09 DIAGNOSIS — I130 Hypertensive heart and chronic kidney disease with heart failure and stage 1 through stage 4 chronic kidney disease, or unspecified chronic kidney disease: Secondary | ICD-10-CM | POA: Insufficient documentation

## 2024-02-09 DIAGNOSIS — W1839XD Other fall on same level, subsequent encounter: Secondary | ICD-10-CM | POA: Diagnosis not present

## 2024-02-09 DIAGNOSIS — M7989 Other specified soft tissue disorders: Secondary | ICD-10-CM | POA: Diagnosis not present

## 2024-02-09 DIAGNOSIS — I509 Heart failure, unspecified: Secondary | ICD-10-CM | POA: Insufficient documentation

## 2024-02-09 DIAGNOSIS — I4891 Unspecified atrial fibrillation: Secondary | ICD-10-CM | POA: Diagnosis not present

## 2024-02-09 DIAGNOSIS — Z5329 Procedure and treatment not carried out because of patient's decision for other reasons: Secondary | ICD-10-CM | POA: Insufficient documentation

## 2024-02-09 DIAGNOSIS — Z7901 Long term (current) use of anticoagulants: Secondary | ICD-10-CM | POA: Diagnosis not present

## 2024-02-09 LAB — CBC WITH DIFFERENTIAL/PLATELET
Abs Immature Granulocytes: 0.02 K/uL (ref 0.00–0.07)
Basophils Absolute: 0.1 K/uL (ref 0.0–0.1)
Basophils Relative: 1 %
Eosinophils Absolute: 0.1 K/uL (ref 0.0–0.5)
Eosinophils Relative: 2 %
HCT: 34.5 % — ABNORMAL LOW (ref 36.0–46.0)
Hemoglobin: 10.7 g/dL — ABNORMAL LOW (ref 12.0–15.0)
Immature Granulocytes: 0 %
Lymphocytes Relative: 9 %
Lymphs Abs: 0.5 K/uL — ABNORMAL LOW (ref 0.7–4.0)
MCH: 30.4 pg (ref 26.0–34.0)
MCHC: 31 g/dL (ref 30.0–36.0)
MCV: 98 fL (ref 80.0–100.0)
Monocytes Absolute: 0.4 K/uL (ref 0.1–1.0)
Monocytes Relative: 7 %
Neutro Abs: 4.5 K/uL (ref 1.7–7.7)
Neutrophils Relative %: 81 %
Platelets: 206 K/uL (ref 150–400)
RBC: 3.52 MIL/uL — ABNORMAL LOW (ref 3.87–5.11)
RDW: 17.1 % — ABNORMAL HIGH (ref 11.5–15.5)
WBC: 5.6 K/uL (ref 4.0–10.5)
nRBC: 0 % (ref 0.0–0.2)

## 2024-02-09 LAB — COMPREHENSIVE METABOLIC PANEL WITH GFR
ALT: 11 U/L (ref 0–44)
AST: 27 U/L (ref 15–41)
Albumin: 3.7 g/dL (ref 3.5–5.0)
Alkaline Phosphatase: 96 U/L (ref 38–126)
Anion gap: 11 (ref 5–15)
BUN: 27 mg/dL — ABNORMAL HIGH (ref 8–23)
CO2: 17 mmol/L — ABNORMAL LOW (ref 22–32)
Calcium: 9.3 mg/dL (ref 8.9–10.3)
Chloride: 106 mmol/L (ref 98–111)
Creatinine, Ser: 1 mg/dL (ref 0.44–1.00)
GFR, Estimated: 59 mL/min — ABNORMAL LOW
Glucose, Bld: 93 mg/dL (ref 70–99)
Potassium: 5.2 mmol/L — ABNORMAL HIGH (ref 3.5–5.1)
Sodium: 134 mmol/L — ABNORMAL LOW (ref 135–145)
Total Bilirubin: 0.6 mg/dL (ref 0.0–1.2)
Total Protein: 6.3 g/dL — ABNORMAL LOW (ref 6.5–8.1)

## 2024-02-09 LAB — PRO BRAIN NATRIURETIC PEPTIDE: Pro Brain Natriuretic Peptide: 4378 pg/mL — ABNORMAL HIGH

## 2024-02-09 LAB — I-STAT CG4 LACTIC ACID, ED: Lactic Acid, Venous: 0.6 mmol/L (ref 0.5–1.9)

## 2024-02-09 MED ORDER — GADOBUTROL 1 MMOL/ML IV SOLN
7.0000 mL | Freq: Once | INTRAVENOUS | Status: AC | PRN
  Administered 2024-02-09: 7 mL via INTRAVENOUS

## 2024-02-09 MED ORDER — ACETAMINOPHEN 500 MG PO TABS
1000.0000 mg | ORAL_TABLET | Freq: Three times a day (TID) | ORAL | Status: DC
  Administered 2024-02-09: 1000 mg via ORAL
  Filled 2024-02-09: qty 2

## 2024-02-09 MED ORDER — TIZANIDINE HCL 4 MG PO TABS
4.0000 mg | ORAL_TABLET | Freq: Four times a day (QID) | ORAL | Status: DC | PRN
  Administered 2024-02-09: 4 mg via ORAL
  Filled 2024-02-09: qty 1

## 2024-02-09 NOTE — Progress Notes (Signed)
 VASCULAR LAB    Right lower extremity venous duplex has been performed.  See CV proc for preliminary results.  Relayed results to Dr. Neysa via secure chat  RACHEL PELLET, RVT 02/09/2024, 3:48 PM

## 2024-02-09 NOTE — ED Provider Triage Note (Cosign Needed Addendum)
 Emergency Medicine Provider Triage Evaluation Note  Nicole [name] , a 73 y.o. female  was evaluated in triage.  Pt complains of swelling to right thigh which was previously drained but states the swelling has persisted.  Some surrounding erythema.  No fever.  No drainage.  Also states the rest of her right lower extremity is swollen more than usual. Went to her PCP 2 days ago.  They drew some outpatient labs.  They were unable to schedule an MRI because of concern for osteomyelitis.  CRP was elevated.    Review of Systems  Positive: As above Negative: As above  Physical Exam  BP (!) 166/71   Pulse (!) 55   Temp 97.8 F (36.6 C)   Resp 17   SpO2 97%  Gen:   Awake, no distress   Resp:  Normal effort  MSK:   Moves extremities without difficulty  Other:    Medical Decision Making  Medically screening exam initiated at 12:08 PM.  Appropriate orders placed.  Nicole [name] Single was informed that the remainder of the evaluation will be completed by another provider, this initial triage assessment does not replace that evaluation, and the importance of remaining in the ED until their evaluation is complete.  Will start off with labs and DVT study given the diffuse right lower extremity swelling.  Advised nursing to increase the acuity so she is prioritized for room.  MRI can be ordered when she gets back to her room if deemed appropriate.   Hildegard Loge, PA-C 02/09/24 1211    Hildegard Loge, PA-C 02/09/24 [phone]

## 2024-02-09 NOTE — ED Provider Notes (Signed)
 " [facility] Provider Note   CSN: [id number] Arrival date & time: 02/09/24  1043     Patient presents with: Abscess and Leg Swelling   LASHAWNDRA LAMPKINS is a 73 y.o. female.   Is a 73 year old female presenting emergency department with swelling and erythema to her right thigh.  Recent hospitalization after a fall with hematoma that was drained.  Was told she had cellulitis placed on antibiotics by her PCP.  Continue to have redness and swelling.  Recently started on second antibiotic.  She reports no improvement.  Denies fevers chills, increased pain.  Having some bloody discharge.  able to ambulate with her walker.  PCP was concerned for possible osteomyelitis and sent to the emergency department   Abscess      Prior to Admission medications  Medication Sig Start Date End Date Taking? Authorizing Provider  acetaminophen  (TYLENOL ) 500 MG tablet Take 500 mg by mouth 2 (two) times daily.   Yes [provider]  amoxicillin -clavulanate (AUGMENTIN ) 875-125 MG tablet Take 1 tablet by mouth 2 (two) times daily for 7 days. 02/05/24 02/12/24 Yes Alvia Corean CROME, FNP  apixaban  (ELIQUIS ) 5 MG TABS tablet TAKE 1 TABLET BY MOUTH TWICE A DAY 12/08/23  Yes Henson, [name], NP-C  Ascorbic Acid  (VITAMIN C ) 1000 MG tablet Take 1,000 mg by mouth in the morning.   Yes [provider]  b complex vitamins capsule Take 1 capsule by mouth daily.   Yes [provider]  Calcium  Carbonate (CALCIUM  600 PO) Take 600 mg by mouth in the morning and at bedtime.   Yes [provider]  Cholecalciferol (VITAMIN D3) 125 MCG (5000 UT) TABS Take 5,000 Units by mouth daily.   Yes [provider]  digoxin  (LANOXIN ) 0.125 MG tablet Take 0.5 tablets (0.0625 mg total) by mouth daily. 12/21/23  Yes Mallipeddi, Vishnu P, MD  doxycycline  (VIBRA -TABS) 100 MG tablet Take 1 tablet (100 mg total) by mouth 2 (two) times daily. 02/07/24  Yes  Henson, [name], NP-C  HYDROcodone -acetaminophen  (NORCO/VICODIN) 5-325 MG tablet Take 1 tablet by mouth every 8 (eight) hours as needed for moderate pain (pain score 4-6). 02/07/24  Yes Henson, [name], NP-C  lidocaine  4 % Place 1 patch onto the skin as needed (pain.).   Yes [provider]  lisinopril  (ZESTRIL ) 2.5 MG tablet TAKE 1 TABLET BY MOUTH EVERY DAY 08/04/23  Yes Henson, [name], NP-C  loperamide (IMODIUM) 2 MG capsule Take 2-4 mg by mouth 4 (four) times daily as needed for diarrhea or loose stools.   Yes [provider]  MAGNESIUM  PO Take 500 mg by mouth in the morning and at bedtime.   Yes [provider]  metFORMIN  (GLUCOPHAGE ) 500 MG tablet TAKE 2 TABLETS BY MOUTH AT BREAKFAST AND 1 TABLET EVERY EVENING WITH MEAL DAILY 10/23/23  Yes Henson, [name], NP-C  metoprolol  tartrate (LOPRESSOR ) 50 MG tablet TAKE 1 TABLET BY MOUTH TWICE A DAY 08/17/23  Yes Henson, [name], NP-C  mirtazapine  (REMERON ) 15 MG tablet TAKE 1 TABLET (15 MG TOTAL) BY MOUTH AT BEDTIME. TAKE 1 TABLET(15 MG) BY MOUTH AT BEDTIME 12/08/23  Yes Henson, [name], NP-C  Omega-3 Fatty Acids (FISH OIL) 1200 MG CAPS Take 1,200 mg by mouth in the morning, at noon, and at bedtime.   Yes [provider]  Propylene Glycol (SYSTANE COMPLETE) 0.6 % SOLN Place 1 drop into both eyes See admin instructions. Instill one drop into  both eyes scheduled twice daily, may use as needed for dry eyes   Yes [provider]  rosuvastatin (CRESTOR) 5 MG tablet TAKE 1 TABLET BY MOUTH EVERY DAY 07/27/23  Yes Henson, [name], NP-C  Specialty Vitamins Products (MENOPAUSE SUPPORT PO) Take 1 tablet by mouth daily.  Gynovite Plus - Menopause Supplement for Women   Yes [provider]  tiZANidine  (ZANAFLEX ) 4 MG tablet TAKE 1 TABLET (4 MG TOTAL) BY MOUTH EVERY 8 (EIGHT) HOURS AS NEEDED FOR MUSCLE SPASMS 02/07/24  Yes Vernetta Lonni GRADE, MD  Turmeric (QC TUMERIC COMPLEX PO) Take 1,000 mg by mouth in the  morning and at bedtime.   Yes [provider]  Accu-Chek Softclix Lancets lancets USE ONE LANCET IN THE MORNING, AT NOON, AND AT BEDTIME 04/27/23   Henson, [name], NP-C  Blood Glucose Monitoring Suppl DEVI 1 each by Does not apply route in the morning, at noon, and at bedtime. May substitute to any manufacturer covered by patient's insurance. 06/08/22   Henson, [name], NP-C  fexofenadine (ALLEGRA) 180 MG tablet Take 180 mg by mouth daily as needed for allergies or rhinitis. Patient not taking: Reported on 02/09/2024    [provider]  glucose blood (ACCU-CHEK GUIDE TEST) test strip Use to check blood sugars 2x daily 12/25/23   Henson, [name], NP-C  moxifloxacin (VIGAMOX) 0.5 % ophthalmic solution Place 1 drop into the right eye 4 (four) times daily. Patient not taking: Reported on 02/09/2024 08/17/23   [provider]  POTASSIUM PO Take 595 mg by mouth in the morning and at bedtime. Patient not taking: No sig reported    [provider]  prednisoLONE  acetate (PRED FORTE ) 1 % ophthalmic suspension Place 1 drop into the right eye 4 (four) times daily. Patient not taking: Reported on 02/09/2024 08/17/23   [provider]    Allergies: Other and Iodinated contrast media    Review of Systems  Updated Vital Signs BP (!) 173/85 (BP Location: Left Arm)   Pulse (!) 55   Temp 98.2 F (36.8 C) (Oral)   Resp 17   SpO2 93%   Physical Exam Vitals and nursing note reviewed.  Constitutional:      General: She is not in acute distress.    Appearance: She is not toxic-appearing.  HENT:     Head: Normocephalic.     Nose: Nose normal.     Mouth/Throat:     Mouth: Mucous membranes are moist.  Eyes:     Conjunctiva/sclera: Conjunctivae normal.  Cardiovascular:     Rate and Rhythm: Normal rate.  Pulmonary:     Effort: Pulmonary effort is normal.  Abdominal:     General: There is no distension.  Musculoskeletal:     Comments: Right upper thigh with  bruising and ecchymosis.  No active drainage.  It is somewhat tender, but no erythema.  No purulent drainage.  No crepitus.  Compartments feel soft.  Neurovascular intact distally.  Skin:    General: Skin is warm and dry.     Capillary Refill: Capillary refill takes less than 2 seconds.  Neurological:     Mental Status: She is alert and oriented to person, place, and time.  Psychiatric:        Mood and Affect: Mood normal.        Behavior: Behavior normal.     (all labs ordered are listed, but only abnormal results are displayed) Labs Reviewed  COMPREHENSIVE METABOLIC PANEL WITH GFR -  Abnormal; Notable for the following components:      Result Value   Sodium 134 (*)    Potassium 5.2 (*)    CO2 17 (*)    BUN 27 (*)    Total Protein 6.3 (*)    GFR, Estimated 59 (*)    All other components within normal limits  CBC WITH DIFFERENTIAL/PLATELET - Abnormal; Notable for the following components:   RBC 3.52 (*)    Hemoglobin 10.7 (*)    HCT 34.5 (*)    RDW 17.1 (*)    Lymphs Abs 0.5 (*)    All other components within normal limits  PRO BRAIN NATRIURETIC PEPTIDE - Abnormal; Notable for the following components:   Pro Brain Natriuretic Peptide 4,378.0 (*)    All other components within normal limits  I-STAT CG4 LACTIC ACID, ED  I-STAT CG4 LACTIC ACID, ED    EKG: None  Radiology: No results found.   Procedures   Medications Ordered in the ED - No data to display  Clinical Course as of 02/09/24 1629  Fri Feb 09, 2024  1240 Saw pcp 12/24: Cellulitis and abscess of right leg, rule out osteomyelitis Persistent swelling and pain in the right leg with a history of drainage and suture placement. Bruising has decreased, but swelling persists. Differential includes osteomyelitis and abscess. No fever, chills, or systemic symptoms. Pain rated at 7/10. Recent fall due to inappropriate footwear. Skin irritation from bandage tape noted. - Ordered stat MRI of right femur to rule out  osteomyelitis and abscess. - Added doxycycline  to Augmentin  for infection coverage. - Checked stat CBC, BMP, C-reactive protein, and sed rate. - Apply antibiotic ointment and bandage with paper tape. - Prescribed hydrocodone  for pain management. - Hold Zanaflex  if taking hydrocodone . - Monitor blood pressure at home. - If symptoms worsen, go to the emergency department or call 911.   Permanent atrial fibrillation Currently on Eliquis  for atrial fibrillation. Eliquis  was stopped due to hypotension and bleeding risk, and has been held since. Recent episodes of bleeding and significant hypotension reported. - Continue holding Eliquis . - Monitor for any signs of bleeding or hypotension.   Hypertension Blood pressure elevated at 160/90. Previous episodes of hypotension noted. Recent changes in antihypertensive medications. - Monitor blood pressure at home.  [TY]    Clinical Course User Index [TY] Neysa Caron PARAS, DO                                 Medical Decision Making 73 year old female presenting emergency department  for leg wound.  Afebrile, hemodynamically stable.  Does not appear toxic on exam.  Per chart review does have a complex past medical history to include hypertension A-fib on Eliquis , alcoholism, seizures, CHF, CKD, cirrhosis.  Her wound does not appear to be overtly infected and seems to be more bruising and ecchymosis rather than infectious process.  She has no fever no tachycardia, no leukocytosis no elevated lactate.  Triage and ordered a BNP which is elevated, but not having shortness of breath, lungs clear.  Ultrasound without evidence of DVT, but still has what appears to be fluid collection/hematoma.  Care signed out to afternoon team dispo pending reevaluation.  Amount and/or Complexity of Data Reviewed Labs: ordered.       Final diagnoses:  None    ED Discharge Orders     None          Neysa Caron PARAS,  DO 02/09/24 1629  "

## 2024-02-09 NOTE — ED Notes (Signed)
 Pt approached RN at nurses station and in raised voice stated  I need to leave now.  Take this IV out now.  MD notified that pt is agitated and is requesting discharge.  MD responded stating We are waiting on MRI results, if pt leaves now it is AMA.  Pt notified and states she is leaving regardless.  Pt refused to sign AMA paperwork.  RN removed IV from pt and directed to exit.

## 2024-02-09 NOTE — ED Provider Notes (Signed)
 4:00 PM Assumed care of patient from off-going team. For more details, please see note from same day.  In brief, this is a 73 y.o. female w/ h/o hematoma s/p drainage, then got ?infected and received abx w/ PCP.  Sent here for MRI to r/o osteomyeltis. No leukocytosis or fever. Overall well-appearing.   Plan/Dispo at time of sign-out & ED Course since sign-out: Reevaluate, ?MRI  BP (!) 173/85 (BP Location: Left Arm)   Pulse (!) 55   Temp 98.1 F (36.7 C)   Resp 17   SpO2 93%    ED Course:   Clinical Course as of 02/09/24 1600  Fri Feb 09, 2024  1240 Saw pcp 12/24: Cellulitis and abscess of right leg, rule out osteomyelitis Persistent swelling and pain in the right leg with a history of drainage and suture placement. Bruising has decreased, but swelling persists. Differential includes osteomyelitis and abscess. No fever, chills, or systemic symptoms. Pain rated at 7/10. Recent fall due to inappropriate footwear. Skin irritation from bandage tape noted. - Ordered stat MRI of right femur to rule out osteomyelitis and abscess. - Added doxycycline  to Augmentin  for infection coverage. - Checked stat CBC, BMP, C-reactive protein, and sed rate. - Apply antibiotic ointment and bandage with paper tape. - Prescribed hydrocodone  for pain management. - Hold Zanaflex  if taking hydrocodone . - Monitor blood pressure at home. - If symptoms worsen, go to the emergency department or call 911.   Permanent atrial fibrillation Currently on Eliquis  for atrial fibrillation. Eliquis  was stopped due to hypotension and bleeding risk, and has been held since. Recent episodes of bleeding and significant hypotension reported. - Continue holding Eliquis . - Monitor for any signs of bleeding or hypotension.   Hypertension Blood pressure elevated at 160/90. Previous episodes of hypotension noted. Recent changes in antihypertensive medications. - Monitor blood pressure at home.  [TY]    Clinical Course User  Index [TY] Neysa Caron PARAS, DO   D/w patient about discharge and f/u with her general surgeon in [place].  I discussed with the patient that she will need to follow-up with a general surgeon and likely the hematoma has reaccumulated and she may need to be evacuated again.  Patient doesn't want to follow-up with that surgeon.  She would rather have the MRI here now.  Ordered the MRI and discussed with patient that it will take several hours.  Ultimately the MRI is completed but patient does not want to wait for the results.  Patient agrees to leave AGAINST MEDICAL ADVICE.  Understands the risks of leaving including missing infection or complications up to and including death.    Dispo: AMA ------------------------------- Sid Boning, MD Emergency Medicine  This note was created using dictation software, which may contain spelling or grammatical errors.   Boning Sid SAILOR, MD 02/10/24 [phone]

## 2024-02-09 NOTE — ED Triage Notes (Signed)
 PT sent from PCP for MRI of right thigh. Pt had abscess drained a few weeks ago and leg has been swelling but has not been draining. PT complains of pain and tenderness to leg. Swelling goes all the way down to foot. Also complains of tenderness behind. Denies and SOB.

## 2024-02-09 NOTE — Telephone Encounter (Signed)
 I was able to speak with tE[facility] as they have contacted the office on behalf of the pt in regards to her MRI. Pt was advised to go the ED ASAP as she is having worsening sxs. Pt stated understanding.

## 2024-02-11 LAB — CULTURE,AEROBIC BACTERIA WITH GRAM STAIN: RESULT:: NO GROWTH

## 2024-02-13 NOTE — Telephone Encounter (Unsigned)
 Copied from CRM #[id number]. Topic: Clinical - Lab/Test Results >> Feb 13, 2024  2:23 PM Nicole [name] wrote: Reason for CRM: Patient would like to know if there is any follow up needed related to her MRI results from 12/26.

## 2024-02-22 ENCOUNTER — Telehealth: Payer: Self-pay | Admitting: *Deleted

## 2024-02-22 NOTE — Transitions of Care (Post Inpatient/ED Visit) (Signed)
 "  02/22/2024  Name: Nicole [name] MRN: [id number] DOB: 1950/05/10  Today's TOC FU Call Status: Today's TOC FU Call Status:: Successful TOC FU Call Completed TOC FU Call Complete Date: 02/22/24  Patient's Name and Date of Birth confirmed. Name, DOB  Transition Care Management Follow-up Telephone Call Date of Discharge: 02/21/24 Discharge Facility: Other [facility]) Name of Other (Non[facility]) Discharge Facility: [facility] Type of Discharge: Inpatient Admission Primary Inpatient Discharge Diagnosis:: Mechanical fall with hematoma How have you been since you were released from the hospital?: Better (I am fine, they only kept me overnight, was mainly in the ER only.  The MRI was negative and so nothing to do.  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- I don't need or want regular calls- I am fine now and back to my normal self) Any questions or concerns?: No  Items Reviewed: Did you receive and understand the discharge instructions provided?: Yes (briefly reviewed with patient who verbalizes good understanding of same - outside hospital AVS) Medications obtained,verified, and reconciled?: No (Declined medication reconciliation/ review; Reports no newly Rx'd medications post-hospital discharge 02/21/24; self-manages medications and denies questions/ concerns around medications today) Medications Not Reviewed Reasons:: Other: (Patient declined all aspects of medication review) Any new allergies since your discharge?: No Dietary orders reviewed?: Yes Type of Diet Ordered:: Healthy as possible Do you have support at home?: Yes People in Home [RPT]: other relative(s) Name of Support/Comfort Primary Source: Reports independent in self-care activities; resides with supportive ex-spouse- assists as/ if needed/ indicated  Medications Reviewed Today: Medications Reviewed Today     Reviewed by Isaih Bulger M, RN (Registered Nurse) on 02/22/24 at 1256  Med List Status: <None>   Medication Order Taking?  Sig Documenting Provider Last Dose Status Informant  Accu-Chek Softclix Lancets lancets [id number]  USE ONE LANCET IN THE MORNING, AT NOON, AND AT BEDTIME Henson, [name], NP-C  Active Self, Pharmacy Records  acetaminophen  (TYLENOL ) 500 MG tablet [id number] No Take 500 mg by mouth 2 (two) times daily. [provider] 02/09/2024 Active Self, Pharmacy Records           Med Note (TASTET, HANNAH E   Wed Jun 08, 2022  4:04 PM) Use as needed for pain, usually takes BID  apixaban  (ELIQUIS ) 5 MG TABS tablet [id number] No TAKE 1 TABLET BY MOUTH TWICE A DAY Henson, [name], NP-C Past Month Active Self, Pharmacy Records           Med Note HAROLDINE, LUKE A   Fri Feb 09, 2024  2:07 PM) Ptn could not define specific timeframe. Confirms not taking for a while, more than a week  Ascorbic Acid  (VITAMIN C ) 1000 MG tablet [id number] No Take 1,000 mg by mouth in the morning. [provider] 02/09/2024 Active Self, Pharmacy Records  b complex vitamins capsule [id number] No Take 1 capsule by mouth daily. [provider] 02/09/2024 Active Self, Pharmacy Records  Blood Glucose Monitoring Suppl DEVI [id number]  1 each by Does not apply route in the morning, at noon, and at bedtime. May substitute to any manufacturer covered by patient's insurance. Lendia Boby CROME, NP-C  Active Self, Pharmacy Records  Calcium  Carbonate (CALCIUM  600 PO) [id number] No Take 600 mg by mouth in the morning and at bedtime. [provider] 02/09/2024 Active Self, Pharmacy Records  Cholecalciferol (VITAMIN D3) 125 MCG (5000 UT) TABS [id number] No Take 5,000 Units by mouth daily. [provider] 02/09/2024 Active Self, Pharmacy Records  digoxin  (LANOXIN ) 0.125 MG tablet [id number] No Take  0.5 tablets (0.0625 mg total) by mouth daily. Mallipeddi, Diannah SQUIBB, MD 02/09/2024 Active Self, Pharmacy Records  doxycycline  (VIBRA -TABS) 100 MG tablet [id number] No Take 1 tablet (100 mg total) by mouth 2 (two) times daily.  Lendia Boby CROME, NP-C 02/09/2024 Active Self, Pharmacy Records           Med Note HAROLDINE, MAINE A   Fri Feb 09, 2024  1:33 PM) 7 day course started on 12-24  fexofenadine (ALLEGRA) 180 MG tablet [id number] No Take 180 mg by mouth daily as needed for allergies or rhinitis.  Patient not taking: Reported on 02/09/2024   [provider] Not Taking Active Self, Pharmacy Records  glucose blood (ACCU-CHEK GUIDE TEST) test strip [id number]  Use to check blood sugars 2x daily Lendia, [name], NP-C  Active Self, Pharmacy Records  HYDROcodone -acetaminophen  (NORCO/VICODIN) 5-325 MG tablet [id number] No Take 1 tablet by mouth every 8 (eight) hours as needed for moderate pain (pain score 4-6). Lendia Boby CROME, NP-C 02/08/2024 Active Self, Pharmacy Records  lidocaine  4 % [id number] No Place 1 patch onto the skin as needed (pain.). [provider] Past Week Active Self, Pharmacy Records  lisinopril  (ZESTRIL ) 2.5 MG tablet [id number] No TAKE 1 TABLET BY MOUTH EVERY DAY Henson, [name], NP-C 02/09/2024 Active Self, Pharmacy Records  loperamide (IMODIUM) 2 MG capsule [id number] No Take 2-4 mg by mouth 4 (four) times daily as needed for diarrhea or loose stools. [provider] Unknown Active Self, Pharmacy Records  MAGNESIUM  PO [id number] No Take 500 mg by mouth in the morning and at bedtime. [provider] 02/09/2024 Active Self, Pharmacy Records  metFORMIN  (GLUCOPHAGE ) 500 MG tablet [id number] No TAKE 2 TABLETS BY MOUTH AT [facility] AND 1 TABLET EVERY EVENING WITH MEAL DAILY Henson, [name], NP-C 02/09/2024 Active Self, Pharmacy Records  metoprolol  tartrate (LOPRESSOR ) 50 MG tablet [id number] No TAKE 1 TABLET BY MOUTH TWICE A DAY Henson, [name], NP-C 02/09/2024 Active Self, Pharmacy Records  mirtazapine  (REMERON ) 15 MG tablet [id number] No TAKE 1 TABLET (15 MG TOTAL) BY MOUTH AT BEDTIME. TAKE 1 TABLET(15 MG) BY MOUTH AT BEDTIME Henson, [name], NP-C 02/08/2024 Active Self, Pharmacy  Records  moxifloxacin (VIGAMOX) 0.5 % ophthalmic solution [id number] No Place 1 drop into the right eye 4 (four) times daily.  Patient not taking: Reported on 02/09/2024   [provider] Not Taking Active Self, Pharmacy Records  Omega-3 Fatty Acids (FISH OIL) 1200 MG CAPS [id number] No Take 1,200 mg by mouth in the morning, at noon, and at bedtime. [provider] 02/09/2024 Active Self, Pharmacy Records  POTASSIUM PO [id number] No Take 595 mg by mouth in the morning and at bedtime.  Patient not taking: No sig reported   [provider] Not Taking Active Self, Pharmacy Records  prednisoLONE  acetate (PRED FORTE ) 1 % ophthalmic suspension [id number] No Place 1 drop into the right eye 4 (four) times daily.  Patient not taking: Reported on 02/09/2024   [provider] Not Taking Active Self, Pharmacy Records  Propylene Glycol (SYSTANE COMPLETE) 0.6 % SOLN [id number] No Place 1 drop into both eyes See admin instructions. Instill one drop into both eyes scheduled twice daily, may use as needed for dry eyes [provider] 02/08/2024 Active Self, Pharmacy Records  rosuvastatin (CRESTOR) 5 MG tablet [id number] No TAKE 1 TABLET BY MOUTH EVERY DAY Henson, [name], NP-C 02/09/2024 Active Self, Pharmacy Records  Specialty Vitamins Products (MENOPAUSE SUPPORT PO) [id number] No Take 1 tablet by mouth daily.  Gynovite Plus - Menopause Supplement for Women [provider] 02/09/2024 Active Self, Pharmacy Records  tiZANidine  (ZANAFLEX ) 4 MG tablet [id number] No TAKE 1 TABLET (4 MG TOTAL) BY MOUTH EVERY 8 (EIGHT) HOURS AS NEEDED FOR MUSCLE SPASMS Vernetta Lonni GRADE, MD 02/09/2024 Active Self, Pharmacy Records  Turmeric (QC TUMERIC COMPLEX PO) [id number] No Take 1,000 mg by mouth in the morning and at bedtime. [provider] 02/09/2024 Active Self, Pharmacy Records           Home Care and Equipment/Supplies: Were Home Health Services Ordered?: No Any  new equipment or medical supplies ordered?: No  Functional Questionnaire: Do you need assistance with bathing/showering or dressing?: No Do you need assistance with meal preparation?: No Do you need assistance with eating?: No Do you have difficulty maintaining continence: No Do you need assistance with getting out of bed/getting out of a chair/moving?: No Do you have difficulty managing or taking your medications?: No  Follow up appointments reviewed: PCP Follow-up appointment confirmed?: Yes [facility] follow up PCP appointment successfully scheduled by [facility] RN CM in real-time 02/28/24) Date of PCP follow-up appointment?: 02/28/24 Follow-up Provider: PCP- Boby Mackintosh Specialist [facility] Follow-up appointment confirmed?: Yes Date of Specialist follow-up appointment?: 03/06/24 Follow-Up Specialty Provider:: orthopedic provider Do you need transportation to your follow-up appointment?: No Do you understand care options if your condition(s) worsen?: Yes-patient verbalized understanding  SDOH Interventions Today    Flowsheet Row Most Recent Value  SDOH Interventions   Food Insecurity Interventions Intervention Not Indicated  Housing Interventions Intervention Not Indicated  Transportation Interventions Intervention Not Indicated  [Reports she resides with her ex-spouse who provides all transportation and has for years now]  Utilities Interventions Intervention Not Indicated   See TOC assessment tabs for additional assessment/ TOC intervention information  Patient declines need for ongoing/ further care management/ coordination outreach; declines enrollment in 30-day TOC program- declines taking my direct phone number should needs/ concerns arise post-TOC call   Pls call/ message for questions,  Massie Mees Mckinney Chinmay Squier, RN, BSN, CCRN Alumnus RN Care Manager  Transitions of Care  [facility] Population [facility]  [place] [phone]: direct office  "

## 2024-02-26 ENCOUNTER — Emergency Department (HOSPITAL_COMMUNITY)

## 2024-02-26 ENCOUNTER — Ambulatory Visit: Payer: Self-pay

## 2024-02-26 ENCOUNTER — Emergency Department (HOSPITAL_COMMUNITY)
Admission: EM | Admit: 2024-02-26 | Discharge: 2024-02-26 | Disposition: A | Discharge status: Home / Self Care | Attending: Emergency Medicine | Admitting: Emergency Medicine

## 2024-02-26 ENCOUNTER — Encounter (HOSPITAL_COMMUNITY): Payer: Self-pay | Admitting: *Deleted

## 2024-02-26 ENCOUNTER — Other Ambulatory Visit: Payer: Self-pay

## 2024-02-26 DIAGNOSIS — Z7901 Long term (current) use of anticoagulants: Secondary | ICD-10-CM | POA: Insufficient documentation

## 2024-02-26 DIAGNOSIS — R350 Frequency of micturition: Secondary | ICD-10-CM | POA: Insufficient documentation

## 2024-02-26 DIAGNOSIS — D649 Anemia, unspecified: Secondary | ICD-10-CM | POA: Insufficient documentation

## 2024-02-26 DIAGNOSIS — W1839XD Other fall on same level, subsequent encounter: Secondary | ICD-10-CM | POA: Insufficient documentation

## 2024-02-26 DIAGNOSIS — S40021D Contusion of right upper arm, subsequent encounter: Secondary | ICD-10-CM | POA: Insufficient documentation

## 2024-02-26 DIAGNOSIS — R531 Weakness: Secondary | ICD-10-CM

## 2024-02-26 DIAGNOSIS — R4182 Altered mental status, unspecified: Secondary | ICD-10-CM | POA: Insufficient documentation

## 2024-02-26 LAB — URINALYSIS, ROUTINE W REFLEX MICROSCOPIC
Bilirubin Urine: NEGATIVE
Glucose, UA: NEGATIVE mg/dL
Ketones, ur: NEGATIVE mg/dL
Nitrite: NEGATIVE
Protein, ur: 30 mg/dL — AB
RBC / HPF: >50 RBC/hpf (ref 0–5)
Specific Gravity, Urine: 1.017 (ref 1.005–1.030)
WBC, UA: >50 WBC/hpf (ref 0–5)
pH: 7 (ref 5.0–8.0)

## 2024-02-26 LAB — CBC WITH DIFFERENTIAL/PLATELET
Abs Immature Granulocytes: 0.09 K/uL — ABNORMAL HIGH (ref 0.00–0.07)
Basophils Absolute: 0.1 K/uL (ref 0.0–0.1)
Basophils Relative: 1 %
Eosinophils Absolute: 0.2 K/uL (ref 0.0–0.5)
Eosinophils Relative: 2 %
HCT: 28.4 % — ABNORMAL LOW (ref 36.0–46.0)
Hemoglobin: 8.7 g/dL — ABNORMAL LOW (ref 12.0–15.0)
Immature Granulocytes: 1 %
Lymphocytes Relative: 8 %
Lymphs Abs: 0.7 K/uL (ref 0.7–4.0)
MCH: 30.9 pg (ref 26.0–34.0)
MCHC: 30.6 g/dL (ref 30.0–36.0)
MCV: 100.7 fL — ABNORMAL HIGH (ref 80.0–100.0)
Monocytes Absolute: 0.6 K/uL (ref 0.1–1.0)
Monocytes Relative: 7 %
Neutro Abs: 7.1 K/uL (ref 1.7–7.7)
Neutrophils Relative %: 81 %
Platelets: 258 K/uL (ref 150–400)
RBC: 2.82 MIL/uL — ABNORMAL LOW (ref 3.87–5.11)
RDW: 17.7 % — ABNORMAL HIGH (ref 11.5–15.5)
WBC: 8.6 K/uL (ref 4.0–10.5)
nRBC: 0.2 % (ref 0.0–0.2)

## 2024-02-26 LAB — RESP PANEL BY RT-PCR (RSV, FLU A&B, COVID)  RVPGX2
Influenza A by PCR: NEGATIVE
Influenza B by PCR: NEGATIVE
Resp Syncytial Virus by PCR: NEGATIVE
SARS Coronavirus 2 by RT PCR: NEGATIVE

## 2024-02-26 LAB — AMMONIA: Ammonia: 18 umol/L (ref 9–35)

## 2024-02-26 LAB — URINE DRUG SCREEN
Amphetamines: NEGATIVE
Barbiturates: NEGATIVE
Benzodiazepines: NEGATIVE
Cocaine: NEGATIVE
Fentanyl: NEGATIVE
Methadone Scn, Ur: NEGATIVE
Opiates: NEGATIVE
Tetrahydrocannabinol: NEGATIVE

## 2024-02-26 LAB — COMPREHENSIVE METABOLIC PANEL WITH GFR
ALT: 13 U/L (ref 0–44)
AST: 31 U/L (ref 15–41)
Albumin: 4 g/dL (ref 3.5–5.0)
Alkaline Phosphatase: 90 U/L (ref 38–126)
Anion gap: 13 (ref 5–15)
BUN: 19 mg/dL (ref 8–23)
CO2: 24 mmol/L (ref 22–32)
Calcium: 9.4 mg/dL (ref 8.9–10.3)
Chloride: 103 mmol/L (ref 98–111)
Creatinine, Ser: 1.01 mg/dL — ABNORMAL HIGH (ref 0.44–1.00)
GFR, Estimated: 58 mL/min — ABNORMAL LOW
Glucose, Bld: 151 mg/dL — ABNORMAL HIGH (ref 70–99)
Potassium: 4.8 mmol/L (ref 3.5–5.1)
Sodium: 140 mmol/L (ref 135–145)
Total Bilirubin: 1.6 mg/dL — ABNORMAL HIGH (ref 0.0–1.2)
Total Protein: 6.7 g/dL (ref 6.5–8.1)

## 2024-02-26 LAB — PROTIME-INR
INR: 1.2 (ref 0.8–1.2)
Prothrombin Time: 15.8 s — ABNORMAL HIGH (ref 11.4–15.2)

## 2024-02-26 LAB — DIGOXIN LEVEL: Digoxin Level: 0.8 ng/mL (ref 0.8–2.0)

## 2024-02-26 LAB — APTT: aPTT: 37 s — ABNORMAL HIGH (ref 24–36)

## 2024-02-26 LAB — MAGNESIUM: Magnesium: 1.8 mg/dL (ref 1.7–2.4)

## 2024-02-26 MED ORDER — CEPHALEXIN 500 MG PO CAPS: 500.0000 mg | ORAL_CAPSULE | Freq: Two times a day (BID) | ORAL | 0 refills | Status: AC

## 2024-02-26 MED ORDER — SODIUM CHLORIDE 0.9 % IV SOLN
1.0000 g | Freq: Once | INTRAVENOUS | Status: AC
  Administered 2024-02-26: 1 g via INTRAVENOUS
  Filled 2024-02-26: qty 10

## 2024-02-26 NOTE — ED Triage Notes (Signed)
 Pt sent by PCP for c/o all over weakness and some confusion. Pt states she woke up feeling that way this morning, unsure what time she woke up. Unsure what time she went to bed or when she last felt normal.

## 2024-02-26 NOTE — Discharge Instructions (Addendum)
 Is important for you to have a recheck later this week by primary doctor If your urine culture is negative you can stop antibiotics.  We gave you first dose today that we will cover you for 24 hours.  You can start the oral antibiotics tomorrow evening. He will need your hemoglobin rechecked to make sure it does not continue to trend low from your injury and hematoma. If you develop fever, dysuria, persistent confusion or new concerns call the ambulance or come to the ER.

## 2024-02-26 NOTE — ED Provider Notes (Addendum)
 " Nicole [name] EMERGENCY DEPARTMENT AT [facility] Provider Note   CSN: [id number] Arrival date & time: 02/26/24  1310     Patient presents with: Weakness and Altered Mental Status   Nicole [name] is a 74 y.o. female.   Patient presents with general weakness and general confusion worsening over the past 4 days.  Patient had a few falls from weakness and was discharged to [facility] where she had hematoma drained.  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  Patient denies any new falls or head injury.  Patient denies fevers or chills.  Patient has just felt general fatigue the past few days.  Patient says she is compliant with her medications including lactulose .  Patient feels better clinically.  Patient has hematoma to the right arm from recent fall.  Patient has urinary frequency without dysuria or fever.  Patient denies any new meds or narcotic use.  The history is provided by the patient.  Weakness Associated symptoms: no abdominal pain, no chest pain, no dysuria, no fever, no headaches, no shortness of breath and no vomiting   Altered Mental Status Associated symptoms: weakness   Associated symptoms: no abdominal pain, no fever, no headaches, no light-headedness, no rash and no vomiting        Prior to Admission medications  Medication Sig Start Date End Date Taking? Authorizing Provider  cephALEXin  (KEFLEX ) 500 MG capsule Take 1 capsule (500 mg total) by mouth 2 (two) times daily for 6 days. 02/27/24 03/04/24 Yes Tonia Chew, MD  Accu-Chek Softclix Lancets lancets USE ONE LANCET IN THE MORNING, AT NOON, AND AT BEDTIME 04/27/23   Henson, [name], NP-C  acetaminophen  (TYLENOL ) 500 MG tablet Take 500 mg by mouth 2 (two) times daily.    [provider]  apixaban  (ELIQUIS ) 5 MG TABS tablet TAKE 1 TABLET BY MOUTH TWICE A DAY 12/08/23   Henson, [name], NP-C  Ascorbic Acid  (VITAMIN C ) 1000 MG tablet  Take 1,000 mg by mouth in the morning.    [provider]  b complex vitamins capsule Take 1 capsule by mouth daily.    [provider]  Blood Glucose Monitoring Suppl DEVI 1 each by Does not apply route in the morning, at noon, and at bedtime. May substitute to any manufacturer covered by patient's insurance. 06/08/22   Henson, [name], NP-C  Calcium  Carbonate (CALCIUM  600 PO) Take 600 mg by mouth in the morning and at bedtime.    [provider]  Cholecalciferol (VITAMIN D3) 125 MCG (5000 UT) TABS Take 5,000 Units by mouth daily.    [provider]  digoxin  (LANOXIN ) 0.125 MG tablet Take 0.5 tablets (0.0625 mg total) by mouth daily. 12/21/23   Mallipeddi, Vishnu P, MD  doxycycline  (VIBRA -TABS) 100 MG tablet Take 1 tablet (100 mg total) by mouth 2 (two) times daily. 02/07/24   Henson, [name], NP-C  fexofenadine (ALLEGRA) 180 MG tablet Take 180 mg by mouth daily as needed for allergies or rhinitis. Patient not taking: Reported on 02/09/2024    [provider]  glucose blood (ACCU-CHEK GUIDE TEST) test strip Use to check blood sugars 2x daily 12/25/23   Henson, [name], NP-C  HYDROcodone -acetaminophen  (NORCO/VICODIN) 5-325 MG tablet Take 1 tablet by mouth every 8 (eight) hours as needed for moderate pain (pain score 4-6). 02/07/24   Henson, [name], NP-C  lidocaine  4 % Place 1 patch onto the skin  as needed (pain.).    [provider]  lisinopril  (ZESTRIL ) 2.5 MG tablet TAKE 1 TABLET BY MOUTH EVERY DAY 08/04/23   Henson, [name], NP-C  loperamide (IMODIUM) 2 MG capsule Take 2-4 mg by mouth 4 (four) times daily as needed for diarrhea or loose stools.    [provider]  MAGNESIUM  PO Take 500 mg by mouth in the morning and at bedtime.    [provider]  metFORMIN  (GLUCOPHAGE ) 500 MG tablet TAKE 2 TABLETS BY MOUTH AT BREAKFAST AND 1 TABLET EVERY EVENING WITH MEAL DAILY 10/23/23   Henson, [name], NP-C  metoprolol  tartrate  (LOPRESSOR ) 50 MG tablet TAKE 1 TABLET BY MOUTH TWICE A DAY 08/17/23   Henson, [name], NP-C  mirtazapine  (REMERON ) 15 MG tablet TAKE 1 TABLET (15 MG TOTAL) BY MOUTH AT BEDTIME. TAKE 1 TABLET(15 MG) BY MOUTH AT BEDTIME 12/08/23   Henson, [name], NP-C  moxifloxacin (VIGAMOX) 0.5 % ophthalmic solution Place 1 drop into the right eye 4 (four) times daily. Patient not taking: Reported on 02/09/2024 08/17/23   [provider]  Omega-3 Fatty Acids (FISH OIL) 1200 MG CAPS Take 1,200 mg by mouth in the morning, at noon, and at bedtime.    [provider]  POTASSIUM PO Take 595 mg by mouth in the morning and at bedtime. Patient not taking: No sig reported    [provider]  prednisoLONE  acetate (PRED FORTE ) 1 % ophthalmic suspension Place 1 drop into the right eye 4 (four) times daily. Patient not taking: Reported on 02/09/2024 08/17/23   [provider]  Propylene Glycol (SYSTANE COMPLETE) 0.6 % SOLN Place 1 drop into both eyes See admin instructions. Instill one drop into both eyes scheduled twice daily, may use as needed for dry eyes    [provider]  rosuvastatin (CRESTOR) 5 MG tablet TAKE 1 TABLET BY MOUTH EVERY DAY 07/27/23   Henson, [name], NP-C  Specialty Vitamins Products (MENOPAUSE SUPPORT PO) Take 1 tablet by mouth daily.  Gynovite Plus - Menopause Supplement for Women    [provider]  tiZANidine  (ZANAFLEX ) 4 MG tablet TAKE 1 TABLET (4 MG TOTAL) BY MOUTH EVERY 8 (EIGHT) HOURS AS NEEDED FOR MUSCLE SPASMS 02/07/24   Vernetta Lonni GRADE, MD  Turmeric (QC TUMERIC COMPLEX PO) Take 1,000 mg by mouth in the morning and at bedtime.    [provider]    Allergies: Other and Iodinated contrast media    Review of Systems  Constitutional:  Positive for fatigue. Negative for chills and fever.  HENT:  Negative for congestion.   Eyes:  Negative for visual disturbance.  Respiratory:  Negative for shortness of breath.   Cardiovascular:   Negative for chest pain.  Gastrointestinal:  Negative for abdominal pain and vomiting.  Genitourinary:  Negative for dysuria and flank pain.  Musculoskeletal:  Negative for back pain, neck pain and neck stiffness.  Skin:  Positive for wound. Negative for rash.  Neurological:  Positive for weakness. Negative for light-headedness and headaches.    Updated Vital Signs BP 137/76 (BP Location: Right Arm)   Pulse 90   Temp 97.8 F (36.6 C) (Oral)   Resp 14   Ht 5' 7 (1.702 m)   Wt 76.2 kg   SpO2 98%   BMI 26.31 kg/m   Physical Exam Vitals and nursing note reviewed.  Constitutional:      General: She is not in acute distress.    Appearance: She is well-developed.  HENT:     Head: Normocephalic and atraumatic.     Mouth/Throat:     Mouth: Mucous membranes are moist.  Eyes:     General:        Right eye: No discharge.        Left eye: No discharge.     Conjunctiva/sclera: Conjunctivae normal.  Neck:     Trachea: No tracheal deviation.  Cardiovascular:     Rate and Rhythm: Normal rate and regular rhythm.     Heart sounds: No murmur heard. Pulmonary:     Effort: Pulmonary effort is normal.     Breath sounds: Normal breath sounds.  Abdominal:     General: There is no distension.     Palpations: Abdomen is soft.     Tenderness: There is no abdominal tenderness. There is no guarding.  Musculoskeletal:        General: Swelling and tenderness present.     Cervical back: Normal range of motion and neck supple. No rigidity.  Skin:    General: Skin is warm.     Capillary Refill: Capillary refill takes less than 2 seconds.     Findings: No rash.     Comments: Patient is hematoma right upper extremity anterior medial aspect soft compartments minimal discomfort.  Patient has ecchymosis extending down entire right arm dorsally.  No warmth or induration to the right arm.  Neurovasc intact.  Neurological:     General: No focal deficit present.     Mental Status: She is alert.      Cranial Nerves: No cranial nerve deficit.     Comments: Patient is finger-nose intact extraocular muscle function intact.  Mild decreased movement of the right arm due to hematoma.  Patient has equal strength in the legs.  Mild fatigue appearance.  No confusion/encephalopathy during exam.  No facial droop.  Psychiatric:     Comments: Tired appearing.     (all labs ordered are listed, but only abnormal results are displayed) Labs Reviewed  CBC WITH DIFFERENTIAL/PLATELET - Abnormal; Notable for the following components:      Result Value   RBC 2.82 (*)    Hemoglobin 8.7 (*)    HCT 28.4 (*)    MCV 100.7 (*)    RDW 17.7 (*)    Abs Immature Granulocytes 0.09 (*)    All other components within normal limits  COMPREHENSIVE METABOLIC PANEL WITH GFR - Abnormal; Notable for the following components:   Glucose, Bld 151 (*)    Creatinine, Ser 1.01 (*)    Total Bilirubin 1.6 (*)    GFR, Estimated 58 (*)    All other components within normal limits  PROTIME-INR - Abnormal; Notable for the following components:   Prothrombin Time 15.8 (*)    All other components within normal limits  APTT - Abnormal; Notable for the following components:   aPTT 37 (*)    All other components within normal limits  URINALYSIS, ROUTINE W REFLEX MICROSCOPIC - Abnormal; Notable for the following components:   Color, Urine AMBER (*)    APPearance CLOUDY (*)    Hgb urine dipstick SMALL (*)    Protein, ur 30 (*)    Leukocytes,Ua LARGE (*)    Bacteria, UA RARE (*)    All other components within normal limits  URINE CULTURE  RESP PANEL BY RT-PCR (RSV, FLU A&B, COVID)  RVPGX2  AMMONIA  URINE DRUG SCREEN  MAGNESIUM   DIGOXIN  LEVEL  ETHANOL  TSH    EKG: EKG  Interpretation Date/Time:  Monday February 26 2024 13:56:09 EST Ventricular Rate:  88 PR Interval:    QRS Duration:  72 QT Interval:  360 QTC Calculation: 435 R Axis:   65  Text Interpretation: Atrial fibrillation Abnormal ECG When compared with ECG of  29-Dec-2023 13:12, PREVIOUS ECG IS PRESENT Confirmed by Tonia Chew [phone]) on 02/26/2024 4:37:27 PM  Radiology: CT Head Wo Contrast Result Date: 02/26/2024 EXAM: CT HEAD WITHOUT CONTRAST 02/26/2024 05:00:00 PM TECHNIQUE: CT of the head was performed without the administration of intravenous contrast. Automated exposure control, iterative reconstruction, and/or weight based adjustment of the mA/kV was utilized to reduce the radiation dose to as low as reasonably achievable. COMPARISON: None available. CLINICAL HISTORY: Mental status change, unknown cause. FINDINGS: BRAIN AND VENTRICLES: No acute hemorrhage. No evidence of acute infarct. No hydrocephalus. No extra-axial collection. No mass effect or midline shift. ORBITS: Bilateral lens replacement. SINUSES: Mucosal thickening left maxillary sinus. SOFT TISSUES AND SKULL: No acute soft tissue abnormality. No skull fracture. IMPRESSION: 1. No acute intracranial abnormality. 2. Bilateral lens replacements. 3. Left maxillary sinus mucosal thickening. Electronically signed by: Dorethia Molt MD MD 02/26/2024 05:57 PM EST RP Workstation: [id number]     Procedures   Medications Ordered in the ED  cefTRIAXone  (ROCEPHIN ) 1 g in sodium chloride  0.9 % 100 mL IVPB (has no administration in time range)                                    Medical Decision Making Amount and/or Complexity of Data Reviewed Labs: ordered.  Risk Prescription drug management.   Patient with known cirrhosis presents with general weakness and fatigue with recent falls.  Patient afebrile, no confusion or encephalopathy at this time.  Broad differential diagnose including metabolic, anemia, UTI, cirrhosis related/pneumonia, dehydration, intracranial/stroke, medication induced, alcohol /drugs, other.  Blood work independently reviewed mild decrease in hemoglobin 8.7, patient's last hemoglobin was 10.7.  Electrolytes and kidney function unremarkable.  Ammonia level returned  normal.  Urinalysis concerning for infection with hemoglobin, increase with blood cells.  Medical records reviewed from January 6 and 7 [facility] patient had hematoma on CT of the arm no fracture, no acute stroke or head bleed on CT head and MRI brain, CT chest concerning for possible pneumonia, old rib fractures.  Patient observed in the ER on reassessment sitting up intermittent smiling, appropriate responses.  No encephalopathy at this time.  Vital signs normal on reassessment.  CT head results independently reviewed no acute abnormalities, urinalysis had a few abnormalities however patient said she normally has frequency and she has no dysuria and no fever and does not feel this is UTI.  Urine culture will be sent.  Digoxin , ammonia levels unremarkable reviewed.  No evidence of hepatic encephalopathy at this time.  Patient stable for close outpatient follow-up and reassessment later this week.  Patient's hematoma stable no signs of infection.  Hemoglobin will require reassessment in the next week.  Prior to discharge we did reevaluate the antibiotic discussion and since patient was weaker than normal and does have urinary frequency plan to give Rocephin  and start Keflex  until they follow-up the urine culture result.  Patient comfortable plan for discharge.    Final diagnoses:  General weakness  Symptomatic anemia  Traumatic hematoma of right upper arm, subsequent encounter    ED Discharge Orders          Ordered    cephALEXin  (KEFLEX ) 500  MG capsule  2 times daily        02/26/24 1856               Tonia Chew, MD 02/26/24 8150    Tonia Chew, MD 02/26/24 1857  "

## 2024-02-26 NOTE — Telephone Encounter (Signed)
 FYI Only or Action Required?: FYI only for provider: ED advised.  Patient was last seen in primary care on 02/07/2024 by Lendia Boby CROME, NP-C.  Called Nurse Triage reporting Fatigue and Dizziness.  Symptoms began today.  Interventions attempted: Nothing.  Symptoms are: unchanged.  Triage Disposition: Call EMS 911 Now  Patient/caregiver understands and will follow disposition?: Yes, but will wait                                  1. DESCRIPTION: Describe your dizziness.     Generalized weakness and dizziness, complains of difficulty completing daily activities  5. ONSET:  When did the dizziness begin?     Worsened today, changes in speech onset suddenly this morning 8. CAUSE: What do you think is causing the dizziness? (e.g., decreased fluids or food, diarrhea, emotional distress, heat exposure, new medicine, sudden standing, vomiting; unknown)     Low hemoglobin, per patient 9. RECURRENT SYMPTOM: Have you had dizziness before? If Yes, ask: When was the last time? What happened that time?     Patient was recently admitted to hospital on 02/20/24 following a fall 10. OTHER SYMPTOMS: Do you have any other symptoms? (e.g., fever, chest pain, vomiting, diarrhea, bleeding)    Slowed speech, diarrhea Denies one-sided weakness/numbness, denies severe headache, denies vision changes    This RN advised ED via 911. Patient declined 911, but stated she would have Vinie (ex-husband) take her to [facility] now.  Reason for Disposition  [1] Loss of speech or garbled speech AND [2] sudden onset AND [3] present now  Protocols used: Dizziness - Lightheadedness-A-AH  Copied from CRM [id number]. Topic: Clinical - Red Word Triage >> Feb 26, 2024 11:12 AM Viola FALCON wrote: Red Word that prompted transfer to Nurse Triage: Patient feeling sudden weakness - she already has hospital follow up 02/28/24 and wants to know what to do. Spouse Vinie on the  phone

## 2024-02-26 NOTE — ED Provider Triage Note (Signed)
 Emergency Medicine Provider Triage Evaluation Note  Nicole [name] , a 74 y.o. female  was evaluated in triage.  Pt complains of generalized weakness with confusion worsening over the past 4 days.  Patient answering questions appropriately appropriate speech.  Recent discharge from [facility] where she had thigh hematoma drained.  Review of Systems  Positive:  Negative:   Physical Exam  BP (!) 140/77   Pulse 85   Temp (!) 97.5 F (36.4 C)   Resp 16   Ht 5' 7 (1.702 m)   Wt 76.2 kg   SpO2 100%   BMI 26.31 kg/m  Gen:   Awake, no distress   Resp:  Normal effort  MSK:   Moves extremities without difficulty  Other:  Cranial nerves grossly intact.  Patient has significant bruising seen to right upper extremity.  Radial pulses are 2+ and symmetric bilaterally.  Compartments are soft throughout upper extremities.  Medical Decision Making  Medically screening exam initiated at 2:48 PM.  Appropriate orders placed.  Nicole [name] Single was informed that the remainder of the evaluation will be completed by another provider, this initial triage assessment does not replace that evaluation, and the importance of remaining in the ED until their evaluation is complete.  Discussed with charge nurse but patient be roomed immediately.  Patient is out of any stroke window given duration of symptoms   Nicole [name], [place] 02/26/24 1450

## 2024-02-26 NOTE — ED Notes (Signed)
 When you have time, Coretha Creswell (family member) [phone] would like an update on pt. Status. Thank you

## 2024-02-26 NOTE — ED Notes (Addendum)
 Weakness is general.  Visitor states pt is not her normal self, but she is doing better now than when she came in.  D/c'd from ED on Tuesday.  Hematoma to R arm has not increased in size.  PT has been taking her pain medication every night, but no more than normal.

## 2024-02-28 ENCOUNTER — Other Ambulatory Visit

## 2024-02-28 ENCOUNTER — Ambulatory Visit: Admitting: Family Medicine

## 2024-02-28 ENCOUNTER — Encounter: Payer: Self-pay | Admitting: Family Medicine

## 2024-02-28 VITALS — BP 134/82 | HR 70 | Temp 97.7°F | Ht 67.0 in | Wt 159.0 lb

## 2024-02-28 DIAGNOSIS — D649 Anemia, unspecified: Secondary | ICD-10-CM

## 2024-02-28 DIAGNOSIS — R296 Repeated falls: Secondary | ICD-10-CM

## 2024-02-28 DIAGNOSIS — K7460 Unspecified cirrhosis of liver: Secondary | ICD-10-CM | POA: Diagnosis not present

## 2024-02-28 DIAGNOSIS — T148XXA Other injury of unspecified body region, initial encounter: Secondary | ICD-10-CM | POA: Diagnosis not present

## 2024-02-28 DIAGNOSIS — Z7901 Long term (current) use of anticoagulants: Secondary | ICD-10-CM

## 2024-02-28 DIAGNOSIS — I4891 Unspecified atrial fibrillation: Secondary | ICD-10-CM | POA: Diagnosis not present

## 2024-02-28 DIAGNOSIS — E1169 Type 2 diabetes mellitus with other specified complication: Secondary | ICD-10-CM

## 2024-02-28 DIAGNOSIS — Z905 Acquired absence of kidney: Secondary | ICD-10-CM

## 2024-02-28 DIAGNOSIS — G40909 Epilepsy, unspecified, not intractable, without status epilepticus: Secondary | ICD-10-CM

## 2024-02-28 DIAGNOSIS — N3001 Acute cystitis with hematuria: Secondary | ICD-10-CM | POA: Diagnosis not present

## 2024-02-28 DIAGNOSIS — S4991XA Unspecified injury of right shoulder and upper arm, initial encounter: Secondary | ICD-10-CM | POA: Diagnosis not present

## 2024-02-28 DIAGNOSIS — E119 Type 2 diabetes mellitus without complications: Secondary | ICD-10-CM

## 2024-02-28 DIAGNOSIS — S0083XA Contusion of other part of head, initial encounter: Secondary | ICD-10-CM

## 2024-02-28 NOTE — Progress Notes (Signed)
 "  Subjective:     Patient ID: Nicole [name] Single, female    DOB: 1950/03/01, 74 y.o.   MRN: [id number]  Chief Complaint  Patient presents with   Hospitalization Follow-up    Right arm still swollen and painful, still taking pain medication and trying vicks vapor rub. Not helping w the pain    HPI  Discussed the use of AI scribe software for clinical note transcription with the patient, who gave verbal consent to proceed.  History of Present Illness Nicole [name] is a 73 year old female with hx of seizures and atrial flutter who presents with arm pain and multiple falls.  Falls and disorientation - Multiple recent falls, most recently two nights ago after waking up disoriented and falling out of bed - Evaluated in the ER after the most recent fall - Suspects a seizure may have contributed to the recent fall - Denies head pain or hypoglycemia - Feels safe at home  Right upper extremity pain and swelling - Persistent swelling and pain in the right upper arm since the most recent fall - Uses extra strength Tylenol  for pain management  Seizure history - History of seizures that were frequent in the past but have stopped since menopause - No seizure medications taken for several years  Atrial flutter and anticoagulation - Takes Eliquis  for atrial flutter  Urinary tract infection - Currently being treated with antibiotics for urinary tract infection  Glycemic control - Blood sugars usually between 120 and 140 - Denies symptoms of hypoglycemia  Musculoskeletal pain - Lower back pain that is chronic   Sleep and sedative use - Does not use sleep medications other than mirtazapine      Health Maintenance Due  Topic Date Due   COVID-19 Vaccine (1) Never done   FOOT EXAM  Never done   Zoster Vaccines- Shingrix (1 of 2) Never done   Colonoscopy  Never done   Pneumococcal Vaccine: 50+ Years (2 of 2 - PCV) 01/05/2018   Influenza Vaccine  09/15/2023   HEMOGLOBIN A1C   12/21/2023    Past Medical History:  Diagnosis Date   Anemia    Arthritis    Asthma    as a child   Atrial fibrillation with RVR (HCC)    Cancer (HCC)    right kidney   CHF (congestive heart failure) (HCC)    Chronic alcoholism (HCC)    Chronic kidney disease    has solitary kidney ( right nephrectomy for cancer)   Chronic left shoulder pain    Depression    Diabetes mellitus without complication (HCC)    Dysrhythmia    Atrial Fib with RVR    GERD (gastroesophageal reflux disease)    Heartburn    History of blood transfusion    History of hiatal hernia    Hypertension    Liver cirrhosis, alcoholic (HCC)    Memory loss    Schizophrenia (HCC)    patient denies having   Seizures (HCC)    last 2024, no meds and she refuses to see neurology   Thyroid  nodule     Past Surgical History:  Procedure Laterality Date   ANKLE FRACTURE SURGERY Right 2008   screws   ANKLE FRACTURE SURGERY Left 2008   plates   ESOPHAGOGASTRODUODENOSCOPY (EGD) WITH PROPOFOL  N/A 12/18/2016   Procedure: ESOPHAGOGASTRODUODENOSCOPY (EGD) WITH PROPOFOL ;  Surgeon: Dianna Specking, MD;  Location: [facility] ENDOSCOPY;  Service: Endoscopy;  Laterality: N/A;   JOINT REPLACEMENT Left    total  knee arthroplasty done at [facility], can't remember when it was   NASAL SINUS SURGERY Left 11/17/2017   Procedure: ENDOSCOPIC SINUS approach;  Surgeon: Carlie Clark, MD;  Location: [facility] OR;  Service: ENT;  Laterality: Left;   NEPHRECTOMY Right    robotic right radical  for cancer.   ORIF ORBITAL FRACTURE Left 11/17/2017   Procedure: Trans Antral Left sided orbital floor fracture repair with dissovable floor implant;  Surgeon: Carlie Clark, MD;  Location: [facility] OR;  Service: ENT;  Laterality: Left;   TONSILLECTOMY     TOTAL HIP ARTHROPLASTY Left 06/23/2023   Procedure: ARTHROPLASTY, HIP, TOTAL, ANTERIOR APPROACH;  Surgeon: Vernetta Lonni GRADE, MD;  Location: [facility] ORS;  Service: Orthopedics;  Laterality: Left;    Family History   Problem Relation Age of Onset   Stroke Mother    Heart attack Father    Ulcers Father    Diabetes Sister    Cancer Brother    Diabetes Sister    Diabetes Sister     Social History   Socioeconomic History   Marital status: Divorced    Spouse name: Not on file   Number of children: Not on file   Years of education: Not on file   Highest education level: Associate degree: academic program  Occupational History   Not on file  Tobacco Use   Smoking status: Former    Current packs/day: 0.00    Average packs/day: 1 pack/day for 15.0 years (15.0 ttl pk-yrs)    Types: Cigarettes    Start date: 11/13/1992    Quit date: 11/14/2007    Years since quitting: 16.3   Smokeless tobacco: Never  Vaping Use   Vaping status: Never Used  Substance and Sexual Activity   Alcohol  use: Not Currently    Comment: none since fall 11/13/17   Drug use: No   Sexual activity: Not Currently  Other Topics Concern   Not on file  Social History Narrative   quit drinking in late August but went on a binge over last weekend (october 26, 26, 28).  Hasn't drank since Sunday (6 days ago) due to abd pain, N, V   Social Drivers of Health   Tobacco Use: Medium Risk (02/28/2024)   Patient History    Smoking Tobacco Use: Former    Smokeless Tobacco Use: Never    Passive Exposure: Not on file  Financial Resource Strain: Low Risk (01/19/2024)   Received from [facility]   Overall Financial Resource Strain (CARDIA)    How hard is it for you to pay for the very basics like food, housing, medical care, and heating?: Not hard at all  Food Insecurity: No Food Insecurity (02/22/2024)   Epic    Worried About Programme Researcher, Broadcasting/film/video in the Last Year: Never true    Ran Out of Food in the Last Year: Never true  Recent Concern: Food Insecurity - Food Insecurity Present (01/08/2024)   Epic    Worried About Programme Researcher, Broadcasting/film/video in the Last Year: Sometimes true    Ran Out of Food in the Last Year: Never true  Transportation  Needs: No Transportation Needs (02/22/2024)   Epic    Lack of Transportation (Medical): No    Lack of Transportation (Non-Medical): No  Physical Activity: Insufficiently Active (01/19/2024)   Received from [facility]   Exercise Vital Sign    On average, how many days per week do you engage in moderate to strenuous exercise (like a brisk walk)?:  3 days    On average, how many minutes do you engage in exercise at this level?: 30 min  Stress: Stress Concern Present (01/19/2024)   Received from [facility] of Occupational Health - Occupational Stress Questionnaire    Do you feel stress - tense, restless, nervous, or anxious, or unable to sleep at night because your mind is troubled all the time - these days?: To some extent  Social Connections: Moderately Isolated (01/19/2024)   Received from [facility]   Social Connection and Isolation Panel    In a typical week, how many times do you talk on the phone with family, friends, or neighbors?: Twice a week    How often do you get together with friends or relatives?: Twice a week    How often do you attend church or religious services?: 1 to 4 times per year    Do you belong to any clubs or organizations such as church groups, unions, fraternal or athletic groups, or school groups?: No    How often do you attend meetings of the clubs or organizations you belong to?: Never    Are you married, widowed, divorced, separated, never married, or living with a partner?: Divorced  Intimate Partner Violence: Not At Risk (02/22/2024)   Epic    Fear of Current or Ex-Partner: No    Emotionally Abused: No    Physically Abused: No    Sexually Abused: No  Depression (PHQ2-9): Medium Risk (02/28/2024)   Depression (PHQ2-9)    PHQ-2 Score: 10  Alcohol  Screen: Low Risk (08/16/2023)   Alcohol  Screen    Last Alcohol  Screening Score (AUDIT): 0  Housing: Unknown (02/22/2024)   Epic    Unable to Pay for Housing in the Last Year: No     Number of Times Moved in the Last Year: Not on file    Homeless in the Last Year: No  Recent Concern: Housing - High Risk (01/08/2024)   Epic    Unable to Pay for Housing in the Last Year: Yes    Number of Times Moved in the Last Year: 0    Homeless in the Last Year: No  Utilities: Not At Risk (02/22/2024)   Epic    Threatened with loss of utilities: No  Health Literacy: Medium Risk (01/19/2024)   Received from [facility] Literacy    How often do you need to have someone help you when you read instructions, pamphlets, or other written material from your doctor or pharmacy?: Sometimes    Outpatient Medications Prior to Visit  Medication Sig Dispense Refill   Accu-Chek Softclix Lancets lancets USE ONE LANCET IN THE MORNING, AT NOON, AND AT BEDTIME 300 each 3   acetaminophen  (TYLENOL ) 500 MG tablet Take 500 mg by mouth 2 (two) times daily.     apixaban  (ELIQUIS ) 5 MG TABS tablet TAKE 1 TABLET BY MOUTH TWICE A DAY 180 tablet 1   Ascorbic Acid  (VITAMIN C ) 1000 MG tablet Take 1,000 mg by mouth in the morning.     b complex vitamins capsule Take 1 capsule by mouth daily.     Blood Glucose Monitoring Suppl DEVI 1 each by Does not apply route in the morning, at noon, and at bedtime. May substitute to any manufacturer covered by patient's insurance. 1 each 0   Calcium  Carbonate (CALCIUM  600 PO) Take 600 mg by mouth in the morning and at bedtime.     cephALEXin  (KEFLEX )  500 MG capsule Take 1 capsule (500 mg total) by mouth 2 (two) times daily for 6 days. 12 capsule 0   Cholecalciferol (VITAMIN D3) 125 MCG (5000 UT) TABS Take 5,000 Units by mouth daily.     digoxin  (LANOXIN ) 0.125 MG tablet Take 0.5 tablets (0.0625 mg total) by mouth daily. 45 tablet 0   glucose blood (ACCU-CHEK GUIDE TEST) test strip Use to check blood sugars 2x daily 300 strip 3   HYDROcodone -acetaminophen  (NORCO/VICODIN) 5-325 MG tablet Take 1 tablet by mouth every 8 (eight) hours as needed for moderate pain (pain  score 4-6). 12 tablet 0   lidocaine  4 % Place 1 patch onto the skin as needed (pain.).     lisinopril  (ZESTRIL ) 2.5 MG tablet TAKE 1 TABLET BY MOUTH EVERY DAY 90 tablet 1   loperamide (IMODIUM) 2 MG capsule Take 2-4 mg by mouth 4 (four) times daily as needed for diarrhea or loose stools.     MAGNESIUM  PO Take 500 mg by mouth in the morning and at bedtime.     metFORMIN  (GLUCOPHAGE ) 500 MG tablet TAKE 2 TABLETS BY MOUTH AT BREAKFAST AND 1 TABLET EVERY EVENING WITH MEAL DAILY 270 tablet 1   metoprolol  tartrate (LOPRESSOR ) 50 MG tablet TAKE 1 TABLET BY MOUTH TWICE A DAY 180 tablet 1   mirtazapine  (REMERON ) 15 MG tablet TAKE 1 TABLET (15 MG TOTAL) BY MOUTH AT BEDTIME. TAKE 1 TABLET(15 MG) BY MOUTH AT BEDTIME 90 tablet 1   Omega-3 Fatty Acids (FISH OIL) 1200 MG CAPS Take 1,200 mg by mouth in the morning, at noon, and at bedtime.     Propylene Glycol (SYSTANE COMPLETE) 0.6 % SOLN Place 1 drop into both eyes See admin instructions. Instill one drop into both eyes scheduled twice daily, may use as needed for dry eyes     rosuvastatin (CRESTOR) 5 MG tablet TAKE 1 TABLET BY MOUTH EVERY DAY 90 tablet 1   Specialty Vitamins Products (MENOPAUSE SUPPORT PO) Take 1 tablet by mouth daily.  Gynovite Plus - Menopause Supplement for Women     tiZANidine  (ZANAFLEX ) 4 MG tablet TAKE 1 TABLET (4 MG TOTAL) BY MOUTH EVERY 8 (EIGHT) HOURS AS NEEDED FOR MUSCLE SPASMS 30 tablet 1   Turmeric (QC TUMERIC COMPLEX PO) Take 1,000 mg by mouth in the morning and at bedtime.     fexofenadine (ALLEGRA) 180 MG tablet Take 180 mg by mouth daily as needed for allergies or rhinitis. (Patient not taking: Reported on 02/28/2024)     POTASSIUM PO Take 595 mg by mouth in the morning and at bedtime. (Patient not taking: Reported on 02/28/2024)     prednisoLONE  acetate (PRED FORTE ) 1 % ophthalmic suspension Place 1 drop into the right eye 4 (four) times daily. (Patient not taking: Reported on 02/28/2024)     doxycycline  (VIBRA -TABS) 100 MG tablet  Take 1 tablet (100 mg total) by mouth 2 (two) times daily. 20 tablet 0   moxifloxacin (VIGAMOX) 0.5 % ophthalmic solution Place 1 drop into the right eye 4 (four) times daily. (Patient not taking: Reported on 02/09/2024)     No facility-administered medications prior to visit.    Allergies[1]  ROS Per HPI     Objective:    Physical Exam   BP 134/82   Pulse 70   Temp 97.7 F (36.5 C) (Temporal)   Ht 5' 7 (1.702 m)   Wt 159 lb (72.1 kg)   SpO2 98%   BMI 24.90 kg/m  Wt Readings from Last  3 Encounters:  02/28/24 159 lb (72.1 kg)  02/26/24 168 lb (76.2 kg)  02/07/24 168 lb (76.2 kg)       Assessment & Plan:   Problem List Items Addressed This Visit     Cirrhosis of liver (HCC) (Chronic)   Relevant Orders   AMB Referral VBCI Care Management   Long term current use of anticoagulant (Chronic)   Relevant Orders   AMB Referral VBCI Care Management   Anemia   Relevant Orders   CBC with Differential/Platelet   Comprehensive metabolic panel with GFR   Ferritin   Folate   Vitamin B12   AMB Referral VBCI Care Management   Atrial fibrillation with rapid ventricular response (HCC)   Relevant Orders   AMB Referral VBCI Care Management   Diabetes mellitus (HCC)   Relevant Orders   CBC with Differential/Platelet   Comprehensive metabolic panel with GFR   Hemoglobin A1c   TSH   AMB Referral VBCI Care Management   H/O right nephrectomy   Seizure disorder (HCC)   Relevant Orders   AMB Referral VBCI Care Management   Traumatic hematoma of cheek   Other Visit Diagnoses       Falls frequently    -  Primary   Relevant Orders   DG Humerus Right   CBC with Differential/Platelet   Comprehensive metabolic panel with GFR   TSH   AMB Referral VBCI Care Management     Hematoma       Relevant Orders   DG Humerus Right     Injury of right upper extremity, initial encounter       Relevant Orders   DG Humerus Right     Acute cystitis with hematuria            Assessment and Plan Assessment & Plan Right upper extremity hematoma due to fall Large hematoma on the right upper arm following a fall from bed. Biceps function intact, but tenderness present. RUE is neurovascularly intact.  - reviewed ED notes and results - negative for humerus fracture on X ray in ED.  -negative CT head Wo contrast - Ordered x-ray of right upper arm due to worsening pain - Advised to keep arm elevated - Scheduled follow-up with Doctor Vernetta on January 21st  Frequent falls - several ED visit for falls with injury.  - she has refused HH and SNF - uses walker at home   Seizure disorder Reports a possible seizure causing her fall.  Seizures previously managed with Tegretol and Neurontin, discontinued several years ago. No current neurologist. Denies concerns about living situation and safety.  - Scheduled appointment with neurology on January 27th with Doctor Georjean - Referred to social work team for home safety assessment  Anemia No discussion of anemia management in the ER. No signs of active bleeding reported. - Ordered CBC to recheck blood count - on Eliquis    Urinary tract infection - diagnosed in the ED  Currently on antibiotics.  Type 2 diabetes mellitus Blood sugars regularly monitored, typically between 120 and 140 mg/dL. No hypoglycemia reported. - Continue current diabetes management plan     I have discontinued Vanya B. Haaland Carole's moxifloxacin and doxycycline . I am also having her maintain her Propylene Glycol, MAGNESIUM  PO, Turmeric (QC TUMERIC COMPLEX PO), Calcium  Carbonate (CALCIUM  600 PO), b complex vitamins, POTASSIUM PO, lidocaine , loperamide, acetaminophen , Blood Glucose Monitoring Suppl, Accu-Chek Softclix Lancets, Fish Oil, vitamin C , Vitamin D3, fexofenadine, Specialty Vitamins Products (MENOPAUSE SUPPORT PO), rosuvastatin, lisinopril , metoprolol  tartrate,  prednisoLONE  acetate, metFORMIN , mirtazapine , Eliquis , digoxin ,  Accu-Chek Guide Test, tiZANidine , HYDROcodone -acetaminophen , and cephALEXin .  No orders of the defined types were placed in this encounter.      [1]  Allergies Allergen Reactions   Other Shortness Of Breath and Anxiety    UNSPECIFIED REACTION to fast foods and processed food like hot dogs   Iodinated Contrast Media Swelling    Hand swelling from contrast dye for MRI   "

## 2024-02-28 NOTE — Patient Instructions (Addendum)
 Please go to [facility] on [address] for labs and an X ray  [address] [facility] is in the basement. Take the elevator down to level B

## 2024-02-29 ENCOUNTER — Telehealth: Payer: Self-pay | Admitting: *Deleted

## 2024-02-29 ENCOUNTER — Ambulatory Visit (HOSPITAL_COMMUNITY)
Admission: RE | Admit: 2024-02-29 | Discharge: 2024-02-29 | Disposition: A | Discharge status: Home / Self Care | Source: Ambulatory Visit | Attending: Family Medicine | Admitting: Family Medicine

## 2024-02-29 ENCOUNTER — Ambulatory Visit: Payer: Self-pay | Admitting: Family Medicine

## 2024-02-29 DIAGNOSIS — S4991XA Unspecified injury of right shoulder and upper arm, initial encounter: Secondary | ICD-10-CM | POA: Diagnosis present

## 2024-02-29 DIAGNOSIS — T148XXA Other injury of unspecified body region, initial encounter: Secondary | ICD-10-CM | POA: Insufficient documentation

## 2024-02-29 DIAGNOSIS — R296 Repeated falls: Secondary | ICD-10-CM | POA: Diagnosis present

## 2024-02-29 LAB — COMPREHENSIVE METABOLIC PANEL WITH GFR
ALT: 9 U/L (ref 3–35)
AST: 18 U/L (ref 5–37)
Albumin: 4 g/dL (ref 3.5–5.2)
Alkaline Phosphatase: 77 U/L (ref 39–117)
BUN: 25 mg/dL — ABNORMAL HIGH (ref 6–23)
CO2: 26 meq/L (ref 19–32)
Calcium: 9.4 mg/dL (ref 8.4–10.5)
Chloride: 100 meq/L (ref 96–112)
Creatinine, Ser: 1.07 mg/dL (ref 0.40–1.20)
GFR: 50.98 mL/min — ABNORMAL LOW
Glucose, Bld: 105 mg/dL — ABNORMAL HIGH (ref 70–99)
Potassium: 4.8 meq/L (ref 3.5–5.1)
Sodium: 135 meq/L (ref 135–145)
Total Bilirubin: 1.3 mg/dL — ABNORMAL HIGH (ref 0.2–1.2)
Total Protein: 6.7 g/dL (ref 6.0–8.3)

## 2024-02-29 LAB — CBC WITH DIFFERENTIAL/PLATELET
Basophils Absolute: 0.1 K/uL (ref 0.0–0.1)
Basophils Relative: 1 % (ref 0.0–3.0)
Eosinophils Absolute: 0.3 K/uL (ref 0.0–0.7)
Eosinophils Relative: 3.6 % (ref 0.0–5.0)
HCT: 28.8 % — ABNORMAL LOW (ref 36.0–46.0)
Hemoglobin: 9.3 g/dL — ABNORMAL LOW (ref 12.0–15.0)
Lymphocytes Relative: 13.5 % (ref 12.0–46.0)
Lymphs Abs: 1.1 K/uL (ref 0.7–4.0)
MCHC: 32.3 g/dL (ref 30.0–36.0)
MCV: 96.4 fl (ref 78.0–100.0)
Monocytes Absolute: 0.7 K/uL (ref 0.1–1.0)
Monocytes Relative: 8.7 % (ref 3.0–12.0)
Neutro Abs: 6.2 K/uL (ref 1.4–7.7)
Neutrophils Relative %: 73.2 % (ref 43.0–77.0)
Platelets: 247 K/uL (ref 150.0–400.0)
RBC: 2.98 Mil/uL — ABNORMAL LOW (ref 3.87–5.11)
RDW: 19.6 % — ABNORMAL HIGH (ref 11.5–15.5)
WBC: 8.5 K/uL (ref 4.0–10.5)

## 2024-02-29 LAB — FERRITIN: Ferritin: 392.5 ng/mL — ABNORMAL HIGH (ref 10.0–291.0)

## 2024-02-29 LAB — FOLATE: Folate: >23.4 ng/mL

## 2024-02-29 LAB — TSH: TSH: 2.12 u[IU]/mL (ref 0.35–5.50)

## 2024-02-29 LAB — HEMOGLOBIN A1C: Hgb A1c MFr Bld: 4.5 % — ABNORMAL LOW (ref 4.6–6.5)

## 2024-02-29 LAB — VITAMIN B12: Vitamin B-12: 869 pg/mL (ref 211–911)

## 2024-02-29 NOTE — Progress Notes (Signed)
 She is anemic which can be contributing to her weakness and falling. Please ask her to call to schedule with [facility]. [phone] option 1 I would like to see her back in 2-3 weeks.

## 2024-02-29 NOTE — Progress Notes (Signed)
 Please call her to clarify which medication to stop and let her know that the X ray of her arm does not show a fracture. It only shows the hematoma.

## 2024-02-29 NOTE — Progress Notes (Signed)
 Complex Care Management Note Care Guide Note  02/29/2024 Name: Nicole [name] MRN: [id number] DOB: 1950-04-23   Complex Care Management Outreach Attempts: An unsuccessful telephone outreach was attempted today to offer the patient information about available complex care management services.  Follow Up Plan:  Additional outreach attempts will be made to offer the patient complex care management information and services.   Encounter Outcome:  No Answer  Thedford Franks, CMA, AAMA [place]  [facility], [facility] Guide, Lead Direct [name]: [phone]  Fax: [phone]

## 2024-02-29 NOTE — Telephone Encounter (Unsigned)
 Copied from CRM [id number]. Topic: Clinical - Lab/Test Results >> Feb 29, 2024  1:14 PM [name] wrote: Reason for CRM: Pt has been provided most recent lab results but she is wanting to confirm which diabetes medications she needs to stop taking.

## 2024-02-29 NOTE — Progress Notes (Signed)
 Complex Care Management Note  Care Guide Note 02/29/2024 Name: [name] MRN: [id number] DOB: 07-01-1950  Nicole [name] is a 74 y.o. year old female who sees [place], [place], NP-C for primary care. I reached out to Nicole [name] by phone today to offer complex care management services.  [name] was given information about Complex Care Management services today including:   The Complex Care Management services include support from the care team which includes your Nurse Care Manager, Clinical Social Worker, or Pharmacist.  The Complex Care Management team is here to help remove barriers to the health concerns and goals most important to you. Complex Care Management services are voluntary, and the patient may decline or stop services at any time by request to their care team member.   Complex Care Management Consent Status: Patient agreed to services and verbal consent obtained.   Follow up plan:  Telephone appointment with complex care management team member scheduled for:  03/04/2024 and 03/13/2024  Encounter Outcome:  Patient Scheduled  Thedford Franks, CMA, AAMA [place]  [facility], [facility] Guide, Lead Direct Dial: [phone]  Fax: [phone]

## 2024-02-29 NOTE — Progress Notes (Signed)
 Please call her and tell her to stop all diabetes medications including metformin .  Her A1c is low now.  I would like to avoid any possible low blood sugars even if she is not seeing them on her meter

## 2024-03-01 ENCOUNTER — Encounter: Payer: Self-pay | Admitting: Gastroenterology

## 2024-03-02 ENCOUNTER — Other Ambulatory Visit: Payer: Self-pay | Admitting: Internal Medicine

## 2024-03-04 ENCOUNTER — Other Ambulatory Visit: Payer: Self-pay

## 2024-03-04 NOTE — Patient Instructions (Signed)
 Visit Information  Thank you for taking time to visit with me today. Please don't hesitate to contact me if I can be of assistance to you before our next scheduled appointment.  Our next appointment is no further scheduled appointments.   Please call the care guide team at [phone] if you need to cancel or reschedule your appointment.     Please call 911 if you are experiencing a Mental Health or Behavioral Health Crisis or need someone to talk to.  Patient verbalized understanding of Care plan and visit instructions communicated this visit  Olam Ally, MSW, LCSW [place]  [facility], [facility] Licensed Clinical Social Worker Direct Dial: [phone]

## 2024-03-04 NOTE — Patient Outreach (Signed)
 LCSW called patient at scheduled time for initial assessment. LCSW discussed the reason for the referral in regards to a home assessment for recent falls. LCSW explained that usually HH would perform that assessment. Patient declined HH coming to the home. LCSW reviewed and discussed SDOH screening which was negative. Patient identified no SDOH needs at this time. LCSW inquired about depression screening and results. Patient explained that she is not seeking treatment. LCSW asked patient of any know social work needs. Patient stated not that she is aware.    LCSW consulted with RN Juana to inform her that LCSW did not enroll patient at this time due to no known social work needs. LCSW discussed consulting LCSW if when RN does enrollment if social work needs are identified.  Olam Ally, MSW, LCSW [place]  [facility], [facility] Licensed Clinical Social Worker Direct Dial: [phone]

## 2024-03-05 ENCOUNTER — Telehealth: Payer: Self-pay | Admitting: Family Medicine

## 2024-03-05 ENCOUNTER — Ambulatory Visit: Payer: Self-pay

## 2024-03-05 NOTE — Telephone Encounter (Signed)
 Pt wants tramadol rx for arm pain, states she was given this initially, is now out of the medication, and would like a refill

## 2024-03-05 NOTE — Telephone Encounter (Signed)
 FYI Only or Action Required?: Action required by provider: medication refill request. Requesting tramadol refill  Patient was last seen in primary care on 02/28/2024 by Lendia Nordmann L, NP-C.  Called Nurse Triage reporting Arm Injury.  Symptoms began a week ago.  Symptoms are: unchanged.  Triage Disposition: See Physician Within 24 Hours  Patient/caregiver understands and will follow disposition?: No, wishes to speak with PCP  The patient reports throbbing pain and swelling in the right upper arm. Symptoms are severe enough to interfere with sleep. The patient is requesting tramadol for pain relief.    Reason for Disposition  [1] MODERATE pain (e.g., interferes with normal activities) AND [2] high-risk adult (e.g., age > 60 years, osteoporosis, chronic steroid use)  Answer Assessment - Initial Assessment Questions 1. MECHANISM: How did the injury happen?     Nicole [name], has been evaluated post fall, 2. ONSET: When did the injury happen? (e.g., minutes, hours ago)      About a week ago 3. LOCATION: Where is the injury located? Which arm?     R  4. APPEARANCE of INJURY: What does the injury look like?      Swelling, bruised 5. SEVERITY: Can you use the arm normally?      yes 7. PAIN: Is there pain? If Yes, ask: How bad is the pain? (Scale 0-10; or none, mild, moderate, severe)     8 9. OTHER SYMPTOMS: Do you have any other symptoms?  (e.g., numbness in hand)     denies   Pt states she was given tramadol for this initially, is now out of the medication, pt would like a refill of this medication. Pt states that she sees the surgeon tomorrow for this arm injury.  Protocols used: Arm Injury-A-AH

## 2024-03-05 NOTE — Telephone Encounter (Signed)
 Called pt and asked her where she got rx from as we do not see in her chart where this was ever prescribed. Pt found the bottle and states it was from a Dr. Julieann. I advised pt per pcp it would be best to reach out to ortho in regards to getting this filled to which pt replied well jesus it is almost the end of the day, I am in pain. That means I am not going to get anything today. I asked pt if she has been taking any other medications for her pain and she said she ran out of the hydrocodone  from [facility]ickie and the Zanaflex  which seemed to work from her ortho provider. She reports she doesn't see ortho until tomorrow. I apologized to pt as I told her Boby is technically out of the office today and no other provider would be willing to send in a pain medication for her however I will still send Vickie the message. I told her if I dont hear anything back today it would be best to ask ortho when she sees them tomorrow. Pt states Well I don't want to have to go and buy a bottle of Vodka, I quit drinking years ago but this pain is just too much. I really don't want to have to be driven to have a drink because of this. Pt was very upset and still thanked me for trying to help, as I was trying to respond to pt she hung up the phone.

## 2024-03-06 ENCOUNTER — Ambulatory Visit: Admitting: Orthopaedic Surgery

## 2024-03-06 ENCOUNTER — Encounter: Payer: Self-pay | Admitting: Orthopaedic Surgery

## 2024-03-06 ENCOUNTER — Other Ambulatory Visit

## 2024-03-06 DIAGNOSIS — Z96642 Presence of left artificial hip joint: Secondary | ICD-10-CM

## 2024-03-06 MED ORDER — TIZANIDINE HCL 4 MG PO TABS: 4.0000 mg | ORAL_TABLET | Freq: Three times a day (TID) | ORAL | 1 refills | Status: AC | PRN

## 2024-03-06 NOTE — Progress Notes (Signed)
 The patient is a 74 year old female who is now 8 months status post a left total hip replacement.  Since we have seen her she has had several mechanical falls.  She does have other comorbidities and we are certainly concerned about her falls.  She was actually seen [facility] a [facility] back in early January which was about 2-1/2 to 3 weeks ago after a fall and she was noted to have a large hematoma at her right humerus.  This was seen on CT scan as well as exam and plain film findings.  There is no fracture but she is on chronic blood thinning medication.  She does state her left hip replacement is doing well.  On exam her left hip is moving smoothly and fluidly and her leg lengths are equal.  An AP pelvis and lateral left hip shows a well-seated total hip arthroplasty with no complicating features.  Examination of her right arm shows a very large hematoma at the mid humerus area.  She is requesting a refill of Zanaflex  I sent that to her pharmacy.  Given her fall risk and her other comorbidities, it would be prudent to try to just watch this hematoma and have it slowly dissolve and resolve with time.  I would not recommend any type of aspiration because that will be too difficult and it would take a surgical decompression to evacuate the hematoma.  However, this is not emergent and with her other comorbidities this is something we should just watch for now unless something worsens.  She will continue to follow-up with her primary care provider and be very careful as she ambulates still using a walker given her significant fall risk.  From the standpoint of her left hip, follow-up can be as needed.

## 2024-03-12 ENCOUNTER — Other Ambulatory Visit: Payer: Self-pay | Admitting: Internal Medicine

## 2024-03-12 ENCOUNTER — Ambulatory Visit: Admitting: Neurology

## 2024-03-13 ENCOUNTER — Other Ambulatory Visit: Payer: Self-pay

## 2024-03-13 NOTE — Patient Outreach (Signed)
 Complex Care Management   Visit Note  03/13/2024  Name:  Nicole [name] MRN: [id number] DOB: 05-08-1950  Situation: Referral received for Complex Care Management related to Complex care management, Frequent falls I obtained verbal consent from Patient.  Visit completed with Patient  on the phone  RNCM called to complete assessment. Patient seemed a little bothered with each assessment question asked. When asked about urine for protein, patient states, I don't know, it should be in the chart, then call was disconnected. RNCM called back and call went to voice message. HIPAA Compliant message left.    Background:   Past Medical History:  Diagnosis Date   Anemia    Arthritis    Asthma    as a child   Atrial fibrillation with RVR (HCC)    Cancer (HCC)    right kidney   CHF (congestive heart failure) (HCC)    Chronic alcoholism (HCC)    Chronic kidney disease    has solitary kidney ( right nephrectomy for cancer)   Chronic left shoulder pain    Depression    Diabetes mellitus without complication (HCC)    Dysrhythmia    Atrial Fib with RVR    GERD (gastroesophageal reflux disease)    Heartburn    History of blood transfusion    History of hiatal hernia    Hypertension    Liver cirrhosis, alcoholic (HCC)    Memory loss    Schizophrenia (HCC)    patient denies having   Seizures (HCC)    last 2024, no meds and she refuses to see neurology   Thyroid  nodule     Assessment: Patient Reported Symptoms:  Cognitive Cognitive Status: Alert and oriented to person, place, and time      Neurological Neurological Review of Symptoms: Not assessed    HEENT HEENT Symptoms Reported: No symptoms reported (patient reports had cataract removed a couple months ago)      Cardiovascular Cardiovascular Symptoms Reported:  (Patient reports she checks her BP about every other day, but does not remember the latest reading.)    Respiratory Respiratory Symptoms Reported: Not assesed     Endocrine Endocrine Symptoms Reported: No symptoms reported Is patient diabetic?: Yes Is patient checking blood sugars at home?: Yes List most recent blood sugar readings, include date and time of day: BS 142 in the morning and 168 in the evening-checked a couple of days ago per patient. Patient reports she was taken off metformin  by PCP. Patient reports she does not record BS. RNCM recommended patient record BP to take to provider office at next visit.    Gastrointestinal Gastrointestinal Symptoms Reported: Not assessed      Genitourinary Genitourinary Symptoms Reported: Not assessed    Integumentary Integumentary Symptoms Reported: Not assessed    Musculoskeletal Additional Musculoskeletal Details: patient acknowledged she has had falls, but denies any falls since last PCP visit adding she has slowed down.        Psychosocial Psychosocial Symptoms Reported: Not assessed     Quality of Family Relationships: supportive, involved Do you feel physically threatened by others?: No   There were no vitals filed for this visit.    Medications Reviewed Today   Medications were not reviewed in this encounter    Follow Up Plan:   RNCM will continue outreach attempts.  Heddy Shutter, RN, MSN, BSN, CCM [place]  [facility], Population Health Case Manager Phone: [phone]

## 2024-03-13 NOTE — Patient Instructions (Signed)
 [name] - I am sorry I was unable to complete our assessment with you today. I work with Lendia, [name], NP-C and am calling to support your healthcare needs. Please contact me at [phone] at your earliest convenience. I look forward to speaking with you soon.   Thank you,   Heddy Shutter, RN, MSN, BSN, CCM [place]  [facility], Population Health Case Manager Phone: [phone]

## 2024-03-14 ENCOUNTER — Telehealth

## 2024-03-14 ENCOUNTER — Telehealth: Payer: Self-pay

## 2024-03-14 NOTE — Telephone Encounter (Signed)
 Fyi, in ED in [place]

## 2024-03-14 NOTE — Telephone Encounter (Signed)
 Copied from CRM #[id number]. Topic: General - Other >> Mar 14, 2024 12:57 PM Corin V wrote: Reason for CRM: FYI- Patient is in the emergency room in [place] due to hematoma on right arm having a skin tare. It was oozing blood today when changing her dressing. She also stopped the Eliquis  on Wednesday due to the hematoma having mild bleeding leading up to today. Please call [phone].

## 2024-03-20 ENCOUNTER — Ambulatory Visit: Admitting: Family Medicine

## 2024-03-27 ENCOUNTER — Ambulatory Visit: Admitting: Gastroenterology

## 2024-04-03 ENCOUNTER — Inpatient Hospital Stay: Admitting: Family Medicine

## 2024-08-22 ENCOUNTER — Ambulatory Visit
